# Patient Record
Sex: Male | Born: 1935
Health system: Southern US, Community
[De-identification: ages and names within clinical notes are randomized; demographics above are authoritative.]

## PROBLEM LIST (undated history)

## (undated) DIAGNOSIS — K922 Gastrointestinal hemorrhage, unspecified: Secondary | ICD-10-CM

## (undated) DIAGNOSIS — K5730 Diverticulosis of large intestine without perforation or abscess without bleeding: Secondary | ICD-10-CM

## (undated) DIAGNOSIS — Z9289 Personal history of other medical treatment: Secondary | ICD-10-CM

## (undated) DIAGNOSIS — K219 Gastro-esophageal reflux disease without esophagitis: Secondary | ICD-10-CM

## (undated) DIAGNOSIS — R634 Abnormal weight loss: Secondary | ICD-10-CM

## (undated) DIAGNOSIS — N183 Chronic kidney disease, stage 3 unspecified: Secondary | ICD-10-CM

## (undated) DIAGNOSIS — K4090 Unilateral inguinal hernia, without obstruction or gangrene, not specified as recurrent: Secondary | ICD-10-CM

## (undated) DIAGNOSIS — I119 Hypertensive heart disease without heart failure: Secondary | ICD-10-CM

## (undated) DIAGNOSIS — I2510 Atherosclerotic heart disease of native coronary artery without angina pectoris: Secondary | ICD-10-CM

## (undated) DIAGNOSIS — F32A Depression, unspecified: Secondary | ICD-10-CM

## (undated) DIAGNOSIS — N529 Male erectile dysfunction, unspecified: Secondary | ICD-10-CM

## (undated) DIAGNOSIS — N35919 Unspecified urethral stricture, male, unspecified site: Secondary | ICD-10-CM

## (undated) DIAGNOSIS — F419 Anxiety disorder, unspecified: Secondary | ICD-10-CM

## (undated) DIAGNOSIS — Z8719 Personal history of other diseases of the digestive system: Secondary | ICD-10-CM

## (undated) DIAGNOSIS — B029 Zoster without complications: Secondary | ICD-10-CM

## (undated) DIAGNOSIS — D509 Iron deficiency anemia, unspecified: Secondary | ICD-10-CM

## (undated) DIAGNOSIS — I739 Peripheral vascular disease, unspecified: Secondary | ICD-10-CM

## (undated) DIAGNOSIS — R55 Syncope and collapse: Secondary | ICD-10-CM

## (undated) DIAGNOSIS — C3490 Malignant neoplasm of unspecified part of unspecified bronchus or lung: Secondary | ICD-10-CM

## (undated) DIAGNOSIS — R001 Bradycardia, unspecified: Secondary | ICD-10-CM

## (undated) DIAGNOSIS — F329 Major depressive disorder, single episode, unspecified: Secondary | ICD-10-CM

## (undated) DIAGNOSIS — Z86010 Personal history of colonic polyps: Secondary | ICD-10-CM

## (undated) DIAGNOSIS — E785 Hyperlipidemia, unspecified: Secondary | ICD-10-CM

## (undated) DIAGNOSIS — I5189 Other ill-defined heart diseases: Secondary | ICD-10-CM

## (undated) DIAGNOSIS — R0600 Dyspnea, unspecified: Secondary | ICD-10-CM

## (undated) HISTORY — DX: Iron deficiency anemia, unspecified: D50.9

## (undated) HISTORY — DX: Dyspnea, unspecified: R06.00

## (undated) HISTORY — DX: Atherosclerotic heart disease of native coronary artery without angina pectoris: I25.10

## (undated) HISTORY — DX: Hyperlipidemia, unspecified: E78.5

## (undated) HISTORY — DX: Depression, unspecified: F32.A

## (undated) HISTORY — DX: Gastro-esophageal reflux disease without esophagitis: K21.9

## (undated) HISTORY — PX: INGUINAL HERNIA REPAIR: SUR1180

## (undated) HISTORY — DX: Syncope and collapse: R55

## (undated) HISTORY — DX: Male erectile dysfunction, unspecified: N52.9

## (undated) HISTORY — DX: Major depressive disorder, single episode, unspecified: F32.9

## (undated) HISTORY — PX: TRANSURETHRAL RESECTION OF PROSTATE: SHX73

## (undated) HISTORY — DX: Diverticulosis of large intestine without perforation or abscess without bleeding: K57.30

## (undated) HISTORY — DX: Zoster without complications: B02.9

## (undated) HISTORY — DX: Unspecified urethral stricture, male, unspecified site: N35.919

## (undated) HISTORY — DX: Anxiety disorder, unspecified: F41.9

## (undated) HISTORY — DX: Abnormal weight loss: R63.4

## (undated) HISTORY — DX: Unilateral inguinal hernia, without obstruction or gangrene, not specified as recurrent: K40.90

## (undated) HISTORY — DX: Personal history of colonic polyps: Z86.010

## (undated) HISTORY — PX: CHOLECYSTECTOMY: SHX55

---

## 1989-01-18 DIAGNOSIS — B029 Zoster without complications: Secondary | ICD-10-CM

## 1989-01-18 HISTORY — DX: Zoster without complications: B02.9

## 1992-05-20 HISTORY — PX: CORONARY ARTERY BYPASS GRAFT: SHX141

## 2002-08-20 ENCOUNTER — Encounter: Payer: Self-pay | Admitting: Internal Medicine

## 2002-08-20 ENCOUNTER — Ambulatory Visit (HOSPITAL_COMMUNITY): Admission: RE | Admit: 2002-08-20 | Discharge: 2002-08-20 | Payer: Self-pay | Admitting: Internal Medicine

## 2003-08-13 ENCOUNTER — Emergency Department (HOSPITAL_COMMUNITY): Admission: EM | Admit: 2003-08-13 | Discharge: 2003-08-13 | Payer: Self-pay | Admitting: Emergency Medicine

## 2003-08-17 ENCOUNTER — Emergency Department (HOSPITAL_COMMUNITY): Admission: EM | Admit: 2003-08-17 | Discharge: 2003-08-17 | Payer: Self-pay | Admitting: Emergency Medicine

## 2003-08-22 ENCOUNTER — Encounter (INDEPENDENT_AMBULATORY_CARE_PROVIDER_SITE_OTHER): Payer: Self-pay | Admitting: Specialist

## 2003-08-22 ENCOUNTER — Inpatient Hospital Stay (HOSPITAL_COMMUNITY): Admission: RE | Admit: 2003-08-22 | Discharge: 2003-08-24 | Payer: Self-pay | Admitting: Urology

## 2005-05-20 DIAGNOSIS — Z86010 Personal history of colonic polyps: Secondary | ICD-10-CM

## 2005-05-20 DIAGNOSIS — Z860101 Personal history of adenomatous and serrated colon polyps: Secondary | ICD-10-CM

## 2005-05-20 HISTORY — DX: Personal history of adenomatous and serrated colon polyps: Z86.0101

## 2005-05-20 HISTORY — DX: Personal history of colonic polyps: Z86.010

## 2005-05-20 HISTORY — PX: CARDIAC CATHETERIZATION: SHX172

## 2006-02-03 ENCOUNTER — Inpatient Hospital Stay (HOSPITAL_COMMUNITY): Admission: EM | Admit: 2006-02-03 | Discharge: 2006-02-05 | Payer: Self-pay | Admitting: Emergency Medicine

## 2006-05-20 DIAGNOSIS — R55 Syncope and collapse: Secondary | ICD-10-CM

## 2006-05-20 HISTORY — DX: Syncope and collapse: R55

## 2006-05-28 ENCOUNTER — Encounter (INDEPENDENT_AMBULATORY_CARE_PROVIDER_SITE_OTHER): Payer: Self-pay | Admitting: Interventional Cardiology

## 2006-05-28 ENCOUNTER — Inpatient Hospital Stay (HOSPITAL_COMMUNITY): Admission: EM | Admit: 2006-05-28 | Discharge: 2006-05-29 | Payer: Self-pay | Admitting: Emergency Medicine

## 2008-02-18 ENCOUNTER — Encounter: Admission: RE | Admit: 2008-02-18 | Discharge: 2008-02-18 | Payer: Self-pay | Admitting: Urology

## 2008-02-22 ENCOUNTER — Ambulatory Visit (HOSPITAL_BASED_OUTPATIENT_CLINIC_OR_DEPARTMENT_OTHER): Admission: RE | Admit: 2008-02-22 | Discharge: 2008-02-23 | Payer: Self-pay | Admitting: Urology

## 2008-05-20 DIAGNOSIS — R0600 Dyspnea, unspecified: Secondary | ICD-10-CM

## 2008-05-20 HISTORY — DX: Dyspnea, unspecified: R06.00

## 2008-07-01 ENCOUNTER — Encounter: Payer: Self-pay | Admitting: Emergency Medicine

## 2008-07-01 ENCOUNTER — Encounter: Admission: RE | Admit: 2008-07-01 | Discharge: 2008-07-01 | Payer: Self-pay | Admitting: Internal Medicine

## 2008-07-20 ENCOUNTER — Encounter: Payer: Self-pay | Admitting: Emergency Medicine

## 2008-08-02 ENCOUNTER — Encounter: Admission: RE | Admit: 2008-08-02 | Discharge: 2008-08-02 | Payer: Self-pay | Admitting: Gastroenterology

## 2008-09-01 ENCOUNTER — Encounter: Payer: Self-pay | Admitting: Emergency Medicine

## 2008-09-16 ENCOUNTER — Encounter: Payer: Self-pay | Admitting: Emergency Medicine

## 2008-09-21 ENCOUNTER — Ambulatory Visit: Payer: Self-pay | Admitting: Emergency Medicine

## 2008-09-21 DIAGNOSIS — I2510 Atherosclerotic heart disease of native coronary artery without angina pectoris: Secondary | ICD-10-CM | POA: Insufficient documentation

## 2008-09-21 DIAGNOSIS — R0600 Dyspnea, unspecified: Secondary | ICD-10-CM

## 2008-09-21 DIAGNOSIS — R0689 Other abnormalities of breathing: Secondary | ICD-10-CM

## 2008-09-21 DIAGNOSIS — R930 Abnormal findings on diagnostic imaging of skull and head, not elsewhere classified: Secondary | ICD-10-CM

## 2008-09-21 DIAGNOSIS — K449 Diaphragmatic hernia without obstruction or gangrene: Secondary | ICD-10-CM | POA: Insufficient documentation

## 2008-10-13 ENCOUNTER — Encounter: Payer: Self-pay | Admitting: Emergency Medicine

## 2008-10-19 ENCOUNTER — Ambulatory Visit: Payer: Self-pay | Admitting: Emergency Medicine

## 2008-10-28 ENCOUNTER — Encounter: Payer: Self-pay | Admitting: Emergency Medicine

## 2008-11-01 ENCOUNTER — Encounter: Payer: Self-pay | Admitting: Emergency Medicine

## 2008-11-04 ENCOUNTER — Ambulatory Visit: Payer: Self-pay | Admitting: Emergency Medicine

## 2008-11-17 DIAGNOSIS — N35919 Unspecified urethral stricture, male, unspecified site: Secondary | ICD-10-CM

## 2008-11-17 HISTORY — DX: Unspecified urethral stricture, male, unspecified site: N35.919

## 2009-01-03 ENCOUNTER — Telehealth (INDEPENDENT_AMBULATORY_CARE_PROVIDER_SITE_OTHER): Payer: Self-pay | Admitting: *Deleted

## 2009-01-18 ENCOUNTER — Ambulatory Visit: Payer: Self-pay | Admitting: Emergency Medicine

## 2010-10-02 NOTE — Op Note (Signed)
Scott [name], Scott [name]             ACCOUNT NO.:  [id number]   MEDICAL RECORD NO.:  [id number]          PATIENT TYPE:  AMB   LOCATION:  [facility]                         FACILITY:  [facility]   PHYSICIAN:  Bertram Millard. Dahlstedt, M.D.DATE OF BIRTH:  [birth year]   DATE OF PROCEDURE:  02/22/2008  DATE OF DISCHARGE:                               OPERATIVE REPORT   PREOPERATIVE DIAGNOSIS:  Benign prostatic hypertrophy/regrowth of  prostate, urethral stricture.   POSTOPERATIVE DIAGNOSIS:  Benign prostatic hypertrophy/regrowth of  prostate, urethral stricture.   PRINCIPAL PROCEDURE:  Cystoscopy, dilatation of the urethra, Gyrus  transurethral resection of the prostate.   SURGEON:  Bertram Millard. Dahlstedt, M.D.   ANESTHESIA:  General with LMA.   COMPLICATIONS:  None.   BRIEF HISTORY:  [name] is an 77 year old male who is several  years out from a TURP.  He has had worsening urinary symptoms, mainly  obstructive in nature.  Cystoscopy earlier this year revealed the  patient to have significant stricture and obvious regrowth of his  prostate.  His symptoms have become tiresome for him, and he desires  further management.  It was recommended that he undergo an operative  procedure to re-resect the regrown prostate.  Risks and complications of  the procedure were discussed with the patient.  He understands these and  desires to proceed.   DESCRIPTION OF PROCEDURE:  The patient was identified in the holding  area.  He received preoperative IV antibiotics.  He was taken to the  operating room where general anesthetic was administered using LMA.  He  was placed in the dorsal lithotomy position.  Genitalia and perineum  were prepped and draped.  Time-out was then called.   The procedure then commenced.  A 22-French panendoscope was advanced  under direct vision through his urethra.  He had a fairly dense  stricture at his bulbous urethra.  I barely got the scope by.  There was  obvious regrowth of  the apical prostate.  The bladder neck, however, was  fairly wide open.  The tissue was obstructive.  I dilated the urethral  stricture to 28-French with Sissy Hoff sounds.  I then placed the  resectoscope - 28-French with the obturator.  The Gyrus element with  resectoscope was then placed.  I first identified the ureteral orifices  which were seen well inside the bladder neck which was wide open.  The  mid and apical tissue of the prostate was obstructive bilaterally.  I  used the button on the gyrus device to resect this apical and mid  prostatic tissue down to the surgical capsule circumferentially around  the prostate.  Resection time was approximately 40 minutes.  No specimen  was sent.  Following resecting the prostate to wide open fossa  circumferentially, the Gyrus was used to cauterize the prostatic  fossa which had been resected.  With the irrigation off, I could see no  active bleeding.  At this point the bladder was drained, the scope  removed, an 20-French Foley catheter placed.  This was hooked to  dependent drainage.   The patient tolerated procedure well.  He  was awakened and taken to PACU  in stable condition.      Bertram Millard. Dahlstedt, M.D.  Electronically Signed     SMD/MEDQ  D:  02/22/2008  T:  02/23/2008  Job:  [id number]   cc:   Corky Crafts, MD  Fax: [phone]   Candyce Churn, M.D.  Fax: [phone]

## 2010-10-05 NOTE — Discharge Summary (Signed)
Scott [name], Scott [name]             ACCOUNT NO.:  [id number]   MEDICAL RECORD NO.:  [id number]          PATIENT TYPE:  INP   LOCATION:  6524                         FACILITY:  [facility]   PHYSICIAN:  Candyce Churn, M.D.DATE OF BIRTH:  [birth year]   DATE OF ADMISSION:  02/11/2006  DATE OF DISCHARGE:  02/05/2006                                 DISCHARGE SUMMARY   DISCHARGE DIAGNOSES:  1. Non-ST-segment elevation myocardial infarction.  2. Status post coronary bypass grafting in 1994 with left heart      catheterization performed on February 04, 2006, with the following      results:  3. Patent left internal mammary artery to the left anterior descending      saphenous vein graft obtuse marginal and saphenous vein graft diagonal      and saphenous vein graft to right coronary artery.  4. Severe native 3-vessel coronary artery disease including the distal      circumflex disease which has likely caused the non-ST-segment change      myocardial infarction during this hospitalization.  Not amenable to      percutaneous intervention.  Also, severe diffuse left anterior      descending disease.  5. Right coronary osteal lesion was amenable to percutaneous coronary      intervention but because the vein graft to the right coronary artery      was patent no percutaneous coronary intervention was necessary.  Left      ventral function was inferior apical hypokinesis and no evidence of      renal artery stenosis.  6. Hypertension.  7. Hypercholesterolemia.  8. History of anxiety.  9. History of depression.  10.Gastroesophageal reflux disease.  11.Erectile dysfunction.  12.Iron-deficiency anemia, thought to be secondary to poor ability to      absorb iron.  Perhaps thinking about triosephosphate isomerase therapy.  13.Patient also has a history of sigmoid diverticulosis per colonoscopy by      Dr. Stan Head in March/2004.  Patient also had a colonoscopy in Oct 15, 2005, by Dr.  Danise Edge revealing a few scattered diverticula      in the sigmoid colon.  Otherwise, was within normal limits.   CONSULTATIONS:  Dr. Everette Rank, Cardiology, February 03, 2006.   PROCEDURES:  Cardiac catheterization with result as above.   DISCHARGE MEDICATIONS:  1. Wellbutrin SR 150 mg orally daily.  2. Toprol-XL 50 mg daily.  3. Omeprazole 20 mg daily.  4. Effexor XR 37.5 mg orally daily.  5. Iron sulfate 325 mg orally b.i.d.  6. Crestor 10 mg orally daily.  7. Imdur 30 mg daily.  8. Aspirin 81 mg orally daily.  9. Vitamin C 500 mg orally b.i.d.  10.Sublingual nitroglycerin 0.4 mg q.5 minutes p.r.n. chest pain up to 3      doses.  11.I plan to add ACE inhibitor and possibly Plavix as an outpatient.   HOSPITAL COURSE:  Scott [name] is a very pleasant 77 year old male  who had has a history of coronary artery disease, who had CABG at [facility] in  [place], [place] in 1994.  CABG was x4.   He has had no chest pain symptoms at all since that time until the morning  of admission when approximately an hour after playing tennis he was sitting  at home quietly and developed a very heavy substernal chest pressure which  apparently lasted about 30 minutes.  He felt like a 50-pound weight was on  my chest the pain radiated down to his shoulders.  He took several aspirin  and the pain seemed to improve and he called our office.  He was seen in our  office, EKG revealed a slight ST-segment elevation in leads II, III and aVF  and a slight ST-segment depression in lead aVL, and the patient was  transferred to [facility] via ambulance with a presumptive  diagnosis of myocardial infarction.   On admission, the patient had cardiac enzymes suggesting mild myocardial  injury with CK peaking at only 315, a CK-MB peaked at 35.1, and troponin I  peaked at 5.52.   The patient was seen in consultation by Dr. Eldridge Dace, who elected to perform  cardiac  catheterization with the results above.   It was felt that Mr. Zelek should be treated medically for his non-Q-  wave myocardial infarction.   Patient experienced no further chest pain while hospitalized and on  admission was treated with intravenous nitroglycerin, intravenous heparin,  and continued aspirin.   LABORATORIES:  Vital signs were stable throughout his hospitalization and  patient was pleased to hear that his coronary artery bypass grafts from 1994  were all remarkably patent.   Patient was found to have an anemia while hospitalized and iron studies  revealed an iron of 21, low, and percent saturation only 5%.  Hemoccult  studies were not performed.   He has been known to have iron deficiency in the past and was to be  discharged on iron supplementation twice daily.  He will be followed up in  the office in 1 week.   LABORATORIES ON DISCHARGE REVEALED THE FOLLOWING:  White count 6500,  hemoglobin 9.4, platelet count 160,000, pro time 12.6 seconds, PTT 30  seconds, sodium 139, potassium 4.2, chloride 106, bicarb 27, glucose 106,  BUN 16, creatinine 1.4, calcium 9.1.   CK peaked at 315 with a CK-MB of 35.1 and troponin of 5.52.  Cholesterol was  190, triglyceride 163, HDL 51, LDL 106, total cholesterol to HDL ratio of  3.7, TSH was normal at 3.458, and iron studies were again as above with an  iron of 21, saturation of 5%, iron binding capacity normal at 382, vitamin  B12 level was normal at 304 mcg per mL.     Candyce Churn, M.D.  Electronically Signed    RNG/MEDQ  D:  02/11/2006  T:  02/11/2006  Job:  [id number]   cc:   Corky Crafts, MD

## 2010-10-05 NOTE — H&P (Signed)
NAMERAYSHUN, KANDLER NO.:  [id number]   MEDICAL RECORD NO.:  [id number]          PATIENT TYPE:  EMS   LOCATION:  [facility]                         FACILITY:  [facility]   PHYSICIAN:  Lucita Ferrara, MD         DATE OF BIRTH:  May 09, 1924   DATE OF ADMISSION:  05/27/2006  DATE OF DISCHARGE:                              HISTORY & PHYSICAL   The patient is an 77 year old male who presents to [facility]  Emergency Room status post a syncopal episode which occurred earlier in  the day.  The patient denied any focal chest pain, shortness of breath.  The patient apparently was in the shower/steam room at which time he  developed weakness and dizziness when she switched from very hot water  to cold water.  Again, when I spoke to him, he denied any chest pain,  although he told the emergency room physician that he had chest pain  that was radiating into the neck and he also had diaphoresis.  He denied  that to me.  He denied any abdominal pain, gastroesophageal reflux or  reproducibility of the chest pain apparently.  The patient was given  aspirin and nitroglycerin and apparently responded to the therapy.   PAST MEDICAL HISTORY:  1. Significant for coronary artery disease.  2. Hypertension.  3. Anemia.   PAST SURGICAL HISTORY:  He has had a coronary artery bypass graft.  He  had his last catheterization done in 2007 due to a non-ST-segment  elevation myocardial infarction which showed a left internal mammary  artery to left anterior descending artery, saphenous vein graft to  obtuse marginal, saphenous vein graft to diagonal, saphenous vein graft  to right coronary artery to be patent.  It did show a lesion amenable to  percutaneous coronary intervention in the ostial right coronary artery  but there was a vein graft in the right coronary artery that was patent,  so there was no percutaneous coronary intervention done at the time.  Recommendations were aggressive medical  therapy at that time.   SOCIAL HISTORY:  The patient denies any drugs, alcohol or smoking.   PHYSICAL EXAMINATION:  GENERAL:  The patient is in no acute distress.  Blood pressure 100/51, pulse is 51, respiration 18, pulse ox 94% on room  air.  HEENT:  Normocephalic and atraumatic.  Sclerae anicteric.  No JVD.  No  carotid bruits.  NECK:  Supple.  PERRLA.  Extraocular muscles intact.  Sclerae anicteric.  CARDIOVASCULAR:  S1-S2, regular rate and rhythm.  No murmurs or rubs.  ABDOMEN:  Soft, nontender, and nondistended.  Positive bowel sounds.  EXTREMITIES:  Without clubbing, cyanosis, or edema.  CHEST:  Clear to auscultation bilaterally.  No rhonchi, wheezes or  rales.   LABORATORY DATA:  Shows a white count of 8.7, hemoglobin 13.4,  hematocrit 40.7, platelet count is 171.  Sodium 138, potassium 5,  chloride 111, BUN 32, glucose 102, creatinine 1.2.  The patient was  given bolus fluids to gravity.  The patient's blood pressure improved  just with fluids.  The patient was alert and oriented  when I speak to  him.  Cardiology was called and recommendations were for observation  overnight.   ASSESSMENT/PLAN:  This is an 77 year old male status post syncopal  episode most likely secondary to vasovagal response from changes in  water temperature.  Myocardial infarction needs to be ruled out  considering history of ischemic heart disease.  Perhaps medications need  to be adjusted.  We will go ahead and observe  overnight with enzymes q.8h. x3.  We will put him in the telemetry unit  to detect any arrhythmias.  The patient is currently hemodynamically  stable.  As far as medications, we will go ahead and continue with blood  pressure parameters.      Lucita Ferrara, MD  Electronically Signed     RR/MEDQ  D:  05/28/2006  T:  05/28/2006  Job:  [id number]

## 2010-10-05 NOTE — Cardiovascular Report (Signed)
Scott [name], Scott [name]             ACCOUNT NO.:  [id number]   MEDICAL RECORD NO.:  [id number]          PATIENT TYPE:  INP   LOCATION:  6524                         FACILITY:  [facility]   PHYSICIAN:  Corky Crafts, MDDATE OF BIRTH:  November 04, 1923   DATE OF PROCEDURE:  02/04/2006  DATE OF DISCHARGE:                              CARDIAC CATHETERIZATION   REFERRING PHYSICIAN:  Candyce Churn, M.D.   PROCEDURES PERFORMED:  Left heart catheterization, left ventriculogram,  abdominal aortogram, coronary angiogram, vein graft angiogram and arterial  graft angiography.   INDICATIONS:  Non-ST segment elevation MI.   OPERATOR:  Corky Crafts, MD.   PROCEDURAL NARRATIVE:  The risks and benefits of cardiac catheterization  were explained to the patient and informed consent was obtained.  The  patient was brought to the cath lab and placed on the table.  He was prepped  and draped in usual sterile fashion.  His right groin was infiltrated with  1% lidocaine, a 6-French arterial sheath was placed into his right femoral  artery using modified Seldinger technique.  The pigtail catheter was  advanced to the aortic valve and across using fluoroscopic guidance. A left  ventriculogram was performed in the 30 degree RAO position.  The catheter  was then pulled back under continuous hemodynamic pressure monitoring.  The  catheter was withdrawn to the level of the renal arteries.  Power injection  of contrast was used to perform the abdominal aortogram.  The  catheter was  then removed.  A JL-4 catheter was used to perform left coronary artery  angiography.  Digital angiography was performed using hand injection of  contrast.  Right coronary artery angiography was then performed used a JR-4  catheter.  The catheter was advanced through the vessel ostium under  fluoroscopic guidance.  Digital angiography was performed in multiple  projections using hand injection of contrast.  Vein graft  angiography was  then performed using the JR-4 catheter.  The graft to the diagonal and  circumflex were identified.  The graft to the right coronary artery could  not be identified with this catheter.  The right catheter was then used to  try to engage the subclavian artery but this was unsuccessful.  The RCA  catheter was then removed and a LIMA catheter was inserted.  This was used  to engage the subclavian.  The catheter was advanced over wire distal to the  origin of the LIMA.  The catheter was then pulled back.  Digital angiography  was performed in multiple projections using hand injection of contrast to  the LIMA and LAD.  The right coronary artery vein graft was then engaged  using a RCB catheter.  Digital angiography was performed in multiple  projections using hand injection of contrast.  The sheath will be removed  using manual compression.   FINDINGS:  The left main is occluded.  There right coronary artery has an  ostial 70% lesion was catheter damping.  There is moderate disease  throughout mid vessel.  SVG to diagonal is widely patent with mild  irregularities.  There is a 70% lesion  in the diagonal proximal to the graft  insertion.  The SVG to the circumflex/OM is widely patent.  The circumflex  and OM vessels are diffusely diseased.  At the distal circumflex there is a  subtotal occlusion which may in fact be the culprit vessel for the patient's  clinical presentation.  The LIMA to the LAD is widely patent.  There is  diffuse severe disease in the mid and distal LAD.  There is a diagonal seen  backfilling.  The SVG to RCA is widely patent.  There are mild  irregularities.  There is a patent posterior descending artery and  posterolateral branch.  The left ventriculogram shows inferoapical focal  hypokinesis.  Overall left ventricular ejection fraction is 40-45%.  The  abdominal aortogram reveals diffuse moderate atherosclerosis of the  infrarenal aorta.  There are single  renal arteries to each kidney.  There is  mild plaquing in both ostia of the renal arteries.   HEMODYNAMIC RESULTS:  Left ventricular pressure 168/10 with an LVEDP of 24,  aortic pressure 166/72 with a mean aortic pressure of 108.   IMPRESSION:  1. Patent left internal mammary artery to left anterior descending,      saphenous vein graft to obtuse marginal, saphenous vein graft to      diagonal, saphenous vein graft to right coronary artery.  2. Severe native three-vessel coronary artery disease including distal      circumflex disease which is the likely culprit for this clinical      presentation.  It is not amenable to percutaneous coronary      intervention.  There is also severe diffuse left anterior descending      disease noted.  3. The only lesion amenable to percutaneous coronary intervention is the      ostial right coronary artery, but since the vein graft to the right      coronary artery is patent, no percutaneous coronary intervention is      necessary.  4. No renal artery stenosis.  5. Decreased left ventricular function with inferoapical hypokinesis.   RECOMMENDATIONS:  The patient received aggressive medical therapy for his  coronary artery disease and decreased ventricular function.  Continue  aspirin, Statin and beta blocker. I would consider adding an ACE inhibitor  if the renal function would tolerate it.  I also considered adding Plavix  when issues with his hemoglobin are fully sorted out.      Corky Crafts, MD  Electronically Signed     JSV/MEDQ  D:  02/04/2006  T:  02/05/2006  Job:  [id number]

## 2010-10-05 NOTE — Op Note (Signed)
NAMEDANNEY, BUNGERT                       ACCOUNT NO.:  [id number]   MEDICAL RECORD NO.:  [id number]                   PATIENT TYPE:  INP   LOCATION:  0378                                 FACILITY:  [facility]   PHYSICIAN:  Bertram Millard. Dahlstedt, M.D.          DATE OF BIRTH:  [birth year]   DATE OF PROCEDURE:  08/22/2003  DATE OF DISCHARGE:                                 OPERATIVE REPORT   PREOPERATIVE DIAGNOSIS:  Benign prostatic hypertrophy with retention.   POSTOPERATIVE DIAGNOSIS:  Benign prostatic hypertrophy with retention.   PRINCIPAL PROCEDURE:  1. Cystoscopy.  2. Transurethral resection of prostate.   SURGEON:  Bertram Millard. Dahlstedt, M.D.   ANESTHESIA:  Subarachnoid block.   COMPLICATIONS:  None.   BRIEF HISTORY:  This nice, 77 year old gentleman initially presented to my  office on the 28th of last month.  He was in retention at that time.  He had  had a catheter placed before he saw me.  He has failed voiding trials x 2.  At this point, the patient desires curative therapy.  He has been left with  a catheter.   It was recommended that he undergo transurethral resection of the prostate.  Options included more voiding trials, a TURP versus minimally invasive  thermotherapy.  He has chosen to have the more aggressive therapies.  He is  aware of the risks and complications, including but not limited to, blood  loss; infection; a temporary leakage; a retrograde ejaculation, among  others.  He understands these and desires to proceed.   DESCRIPTION OF PROCEDURE:  The patient was administered a subarachnoid  block.  He was placed in the dorsal lithotomy position.  Preoperative IV  antibiotics had been administered.  A 22 French panendoscope was passed  through his urethra.  His prostate was significantly obstructed, at least 4  cm long.  He had a large median lobe as well as coapting lateral lobes.  The  bladder was inspected.  There were scattered trabeculations and  cellules.  The ureteral orifices were identified.  Resection was started with the  median lobe.  The intravesical median lobe was resected, taking care to  avoid the ureteral orifices.  Once this was performed, the anterior prostate  was resected.  This allowed the lateral lobes to fall apart and be in a more  posterior location.  Using the verumontanum as a landmark, both lateral  lobes were resected down to the surgical capsule, starting anteriorly and  moving posteriorly.  A significant amount of adenoma was resected.  Following adequate resection of both the median and lateral lobes,  hemostasis was achieved carefully by systematically going over the prostate  and taking care of the small bleeders with the electrocautery.  Hemostasis  was excellent.  The chips were irrigated from the bladder.  The bladder neck  mucosa was then carefully inspected and all small bleeders were identified  and coagulated.  At this point, there was  no active bleeding.  The bladder  was then again inspected for small retained chips.  None were seen.  At this  time, the scope was removed, and a 22 [place] three-way Foley catheter with a  30 mL balloon was placed.  This was hooked to irrigation.   The patient tolerated the procedure well.  Chips were sent for pathologic  specimen.  He was transported to the PACU in stable condition.                                               Bertram Millard. Dahlstedt, M.D.    SMD/MEDQ  D:  08/22/2003  T:  08/22/2003  Job:  [id number]   cc:   Candyce Churn, M.D.  [address] [place]  [place] [address]  Fax: [phone]

## 2010-10-05 NOTE — Consult Note (Signed)
[name], Scott [name]             ACCOUNT NO.:  [id number]   MEDICAL RECORD NO.:  [id number]          PATIENT TYPE:  INP   LOCATION:  6524                         FACILITY:  [facility]   PHYSICIAN:  Corky Crafts, MDDATE OF BIRTH:  08-Nov-1923   DATE OF CONSULTATION:  02/03/2006  DATE OF DISCHARGE:  02/05/2006                                   CONSULTATION   REASON FOR CONSULTATION:  Chest pain and EKG changes.   HISTORY OF PRESENT ILLNESS:  The patient is an 77 year old man with a  history of bypass surgery in 1994.  He also has high blood pressure and  hyperlipidemia.  He had a sudden onset of non-exertional chest pain several  weeks ago after playing tennis.  This went away spontaneously. A similar  event occurred today but was more severe. The chest pain felt like a weight  sitting on his chest.  There was radiation to his neck and both shoulders.  He did have some diaphoresis.  There is no shortness of breath.  He did have  chest pain prior to his bypass surgery but it was even more severe than this  episode.  The chest pain was relieved after aspirin and nitroglycerin was  administered. He had some ST-segment changes in the inferior leads.  He was  sent to the emergency room for further evaluation.   PAST MEDICAL HISTORY:  1. Coronary artery disease status post bypass.  2. Hypertension.  3. Dyslipidemia.  4. Gastroesophageal reflux disease.  5. Iron deficiency anemia.  6. Depression and anxiety.  7. BPH.   PAST SURGICAL HISTORY:  CABG, TURP, cholecystectomy, and inguinal hernia  repair.   ALLERGIES:  No known drug allergies.   MEDICATIONS AT HOME:  Aspirin 81 mg daily, Crestor 10 mg daily, Toprol XL 50  mg daily, Effexor 37.5 mg daily, iron, Omeprazole, Wellbutrin, vitamin C.   FAMILY HISTORY:  No early coronary artery disease.   SOCIAL HISTORY:  The patient lives with his wife in [place].  He does not  smoke.  He does not use any illegal drugs.  He occasionally  drinks alcohol.   REVIEW OF SYSTEMS:  No recent weight loss.  No fevers or chills.  No nausea  or vomiting.  No focal weakness. Chest pain as described above.  All other  systems negative.   PHYSICAL EXAMINATION:  VITAL SIGNS:  Blood pressure is 154/65, heart rate 50.  GENERAL:  The patient is awake, alert, in no apparent distress.  HEENT:  Head normocephalic, atraumatic.  Eyes:  Extraocular movements  intact.  NECK:  No carotid bruits.  CARDIOVASCULAR:  Bradycardic, S1 and S2, well healed CABG scar.  LUNGS:  Clear to auscultation bilaterally.  ABDOMEN:  Soft, nontender.  EXTREMITIES:  No edema, 2+ posterior tibial pulses bilaterally, 2+ right  femoral pulse.  SKIN:  No rash.  NEUROLOGICAL:  No focal motor or sensory deficits.  PSYCHIATRIC:  Normal mood and affect.  BACK:  No kyphosis or scoliosis.   LABORATORY DATA:  Hemoglobin 10.4. Cardiac enzymes showed troponin 0.08 with  a CK of 134 and MB of 4.7.  EKG showed normal sinus rhythm with some Q-waves  and slight ST-segment elevation noted in the inferior leads.   MEDICAL DECISION MAKING:  77 year old with unstable angina.   PLAN:  1. Continue IV nitroglycerin and heparin.  The patient's troponin is      mildly elevated, we will plan for cardiac catheterization in the      morning.  The patient is currently pain free.  2. Continue Toprol and Crestor along with aspirin.  3. Based on the results of the tests, I would consider adding Plavix, as      well.  4. The risks and benefits of cardiac catheterization were explained to the      patient and he agrees with proceeding.   We will follow the patient while he is in the hospital.      Corky Crafts, MD  Electronically Signed     JSV/MEDQ  D:  02/05/2006  T:  02/06/2006  Job:  [id number]

## 2010-10-05 NOTE — Discharge Summary (Signed)
[name], [name]             ACCOUNT NO.:  [id number]   MEDICAL RECORD NO.:  [id number]          PATIENT TYPE:  INP   LOCATION:  6524                         FACILITY:  [facility]   PHYSICIAN:  Candyce Churn, M.D.DATE OF BIRTH:  1924/01/11   DATE OF ADMISSION:  02/03/2006  DATE OF DISCHARGE:  02/05/2006                                 DISCHARGE SUMMARY   CHIEF COMPLAINT:  Chest pain.   HISTORY OF PRESENT ILLNESS:  [name] is a pleasant 77 year old  male with a history of coronary artery disease with CABG times four in 1994.  He presented with approximately 30 minutes to an hour of chest that felt  like a 50 pound weight in the center of his chest radiating to both  shoulders that occurred spontaneously while sitting at home approximately an  hour after playing tennis this morning. He felt quite well while playing  tennis. He took several aspirin and the pain ultimately resolved. He came  into [facility] for assessment of the pain that he had  previously and EKG was abnormal with ST segment elevation in 2, 3, AVF and  reciprocal changes in AVL. Approximately 1 mm elevation of ST segment. He  was transported to the Emergency Room for emergent cardiac consult and  intravenous Heparin and nitroglycerin were initiated.   PAST MEDICAL HISTORY:  (1) Hypertension.  (2) Hypercholesterolemia.  (3) Anxiety.  (4) Depression.  (5) GERD.  (6) Erectile dysfunction.  (7) Iron deficiency enema thought to be secondary to diverticulosis and  possibly secondary to PPI therapy chronically.   MEDICATIONS:  (1) Wellbutrin SR 150 mg daily.  (2) Effexor 32.5 mg daily.  (3) Toprol XL 50 mg daily.  (4) Omeprazole 20 mg daily.  (5) Iron sulfate 325 mg p.o. daily.  (6) Vitamin C 500 mg daily.  (7) Crestor 10 mg daily.   PAST SURGICAL HISTORY:  (1) CABG in 1994 with LIMA to the LAD and three  saphenous vein grafts.  (2) Status post cholecystectomy.  (3)  Bilateral inguinal hernia repair.  (4) TURP per Dr. Retta Diones in April 2005.   FAMILY HISTORY:  No history of premature coronary artery disease but two  brothers that developed heart disease in the 72's and 60's.   SOCIAL HISTORY:  The patient is married, retired Programmer, multimedia. Moved to  [place] approximately five years ago. Enjoys gardening and classical  music. Does get some counseling with Dr. Janean Sark for some mild chronic  marital issues that primarily occurred in the past. Getting along well with  his wife currently.   REVIEW OF SYSTEMS:  The patient denies any chest pain currently. No  shortness of breath. Did not have any diaphoresis with this. No recent  change in bowel habits or urinary habits. Otherwise feels well in general  and plays tennis four or five days a week.   PHYSICAL EXAMINATION:  Alert, oriented male in no acute distress. Weight  164, temperature 97.9, height 5 feet, 6 inches, pulse 56 and regular, blood  pressure 140/86.  HEENT exam reveals him to be atraumatic, normocephalic. Otherwise without  abnormalities. Mucous membranes are moist.  NECK: Supple without JVD or thyromegaly or carotid bruits.  CHEST: Clear to auscultation.  CARDIAC: Regular rhythm with no murmur, gallops, or rubs.  ABDOMEN: Soft, nontender and nondistended, bowel sounds are normal. No  obvious organomegaly.  EXTREMITIES: Without clubbing, cyanosis, or edema.  NEUROLOGICAL: Reveals him to be oriented times three. Nonfocal exam.  SKIN: Without rashes.   EKG reveals sinus bradycardia at 50. He has approximately 1 mm ST segment  elevation at 2, 3 and AVF and reciprocal at 1 mm ST segment depression in  AVL. He is in sinus bradycardia. He has got normal intervals and axis.  Consistent with possible acute inferior myocardial infarction.   ASSESSMENT:  Chest pain with EKG changes suggestive of an inferior  myocardial injury.   PLAN:  Transfer to the Emergency Room with  immediate Cardiac consult with IV  Heparin, IV nitroglycerin and nasal cannula O2. We will continue Crestor,  aspirin, Protonix, beta blocker therapy, Effexor and Wellbutrin. We will  also check serial cardiac enzymes and check a portable chest x-ray.      Candyce Churn, M.D.  Electronically Signed     RNG/MEDQ  D:  02/12/2006  T:  02/12/2006  Job:  [id number]   cc:   Corky Crafts, MD

## 2010-10-05 NOTE — Consult Note (Signed)
Scott [name], Scott [name]             ACCOUNT NO.:  [id number]   MEDICAL RECORD NO.:  [id number]          PATIENT TYPE:  INP   LOCATION:  5501                         FACILITY:  [facility]   PHYSICIAN:  Corky Crafts, MDDATE OF BIRTH:  1923/10/13   DATE OF CONSULTATION:  05/28/2006  DATE OF DISCHARGE:                                 CONSULTATION   REFERRING PHYSICIAN:  Candyce Churn, M.D.   REASON FOR CONSULTATION:  Syncope.   HISTORY OF PRESENT ILLNESS:  The patient is an 77 year old man who had  bypass surgery many years ago.  He underwent cardiac catheterization in  2007 which showed patent grafts with severe three-vessel native coronary  artery disease.  There is also significant distal circumflex disease  which was not amenable to PCI. The patient's ejection fraction of 40-45%   The patient was admitted [facility] yesterday after he had a syncopal  episode while in the shower. He had been in a sauna and then got up and  went to the shower. He felt weak and dizzy.  He denied any palpitations,  chest pain or shortness of breath.  He did lose consciousness after  sliding to the floor. He may have hit his head. He was given aspirin and  sublingual nitroglycerin by EMS upon arrival. He was found to have sinus  bradycardia with a rate in the 50s.  For a while in the hospital, his  telemetry showed sinus bradycardia.  He has not had any further symptoms  of lightheadedness.   PAST MEDICAL HISTORY:  1. Coronary artery disease status post CABG.  2. Hypertension.  3. Dyslipidemia.  4. GERD.  5. Anemia.  6. Depression, anxiety.  7. BPH.   PAST SURGICAL HISTORY:  1. CABG.  2. TURP.  3. Cholecystectomy.  4. Inguinal hernia repair.   ALLERGIES:  No known drug allergies.   MEDICATIONS AT HOME:  1. Toprol XL 50 mg daily.  2. Imdur 30 mg daily.  3. Crestor 10 mg daily.  4. Aspirin 81 mg daily.  5. Lasix.  6. Prilosec over-the-counter.  7. Effexor 37.5 daily.  8. Stool  softener 1 tablet daily.   FAMILY HISTORY:  No significant early coronary artery disease.   SOCIAL HISTORY:  The patient does not smoke, drink or use any illegal  drugs.   REVIEW OF SYSTEMS:  He does report issues with night sweats over the  past several weeks.  He has had syncope as well as reported above. He  has occasional chest pain which resolves spontaneously. All other  systems negative.   PHYSICAL EXAMINATION:  VITAL SIGNS: Blood pressure is 125/67, heart rate  ranged from 48-53, respiratory rate of 16, 96% on room air.  HEENT: Unremarkable.  NECK:  No JVD or carotid bruits.  LUNGS:  Clear to auscultation bilaterally.  CARDIOVASCULAR:  Bradycardic.  S1, S2.  A 2/6 early systolic murmur.  ABDOMEN:  Soft, nontender, nondistended.  EXTREMITIES:  Showed no edema.  NEUROLOGIC:  No focal motor or sensory deficits.  SKIN:  No rash.  BACK:  No kyphosis.  PSYCH:  Normal mood and affect.  LABORATORY DATA:  Shows a potassium this morning elevated at 6.0,  creatinine 1.4. Hematocrit 40.7. Troponin negative x2.   EKG shows sinus bradycardia, no significant ST-T wave changes.   MEDICAL DECISION-MAKING:  An 77 year old with syncopal episode.   PLAN:  1. Cardiac: The patient is bradycardic, but his heart rate has not      dropped below 40.  His heart rate has chronically been in this      range of the low 50s. I would be surprised if this were the cause      of his syncope unless his heart rate were to drop below 40.  Will      hold his Toprol regardless and see if he has any further      arrhythmias on telemetry.  2  Would check 2-D echocardiogram to evaluate valvular heart function.  1. A likely possibility is also just vasodilation in the setting of      being in a warm sauna. He also takes Effexor which can have      vasodilation as a side effect.  He also drinks alcohol regularly.      His creatinine is slightly elevated today which makes me wonder if      he may be a little  bit dehydrated. I would hold his Lasix as well      and perhaps give him some IV fluids.  2. I will follow the patient.      Corky Crafts, MD  Electronically Signed     JSV/MEDQ  D:  05/28/2006  T:  05/28/2006  Job:  [id number]

## 2010-10-05 NOTE — Discharge Summary (Signed)
Scott [name], Scott [name]             ACCOUNT NO.:  [id number]   MEDICAL RECORD NO.:  [id number]          PATIENT TYPE:  INP   LOCATION:  5501                         FACILITY:  [facility]   PHYSICIAN:  Candyce Churn, M.D.DATE OF BIRTH:  1923-12-04   DATE OF ADMISSION:  05/27/2006  DATE OF DISCHARGE:  05/29/2006                               DISCHARGE SUMMARY   DISCHARGE DIAGNOSES:  1. Syncope likely secondary to vasodilatation secondary to switching      from shower steam room to very cold water while at a health club      with possible concomitant mild dehydration.  2. History of coronary artery disease.  3. History of hypertension.  4. History of anemia.  5. History of coronary artery bypass graft with last cardiac      catheterization in 2007.  6. NonST segment elevation myocardial infarction in 2007 with a right      ostial coronary artery stenosis noted, but vein graft was patent to      the right coronary artery, and no percutaneous coronary      intervention was performed at the time.   DISCHARGE MEDICATIONS:  1. Toprol XL 25 mg daily which is a decrease in prior dose.  2. New iron three times daily.  3. Coated aspirin 81 mg daily.  4. Effexor 37.5 mg daily.  5. Imdur 30 mg a day.  6. Crestor 10 mg a day.  7. Prilosec 20 mg daily.  8. Lasix will be half usual dose at home prior to admission  -      question 20 mg daily.   CONSULTATIONS:  Dr. Lance Muss, Cardiology.   HOSPITAL COURSE:  Mr. Pulsifer  is a pleasant 77 year old male who had  bypass surgery many years ago and underwent cardiac catheterization in  2007 showing patent grafts with severe three-vessel negative coronary  artery disease, and he had a significant distal circumflex disease which  is not amenable to PCI  and a right ostial lesion, but it was that right  coronary ostial lesion that was not in need of a angioplasty and/or  splinting secondary to patent graft to the RCA.   The patient  admitted to [facility] on May 28, 2006 after having a  syncopal episode at a health club in the shower after going from hot to  cold temperature first in the Sauna and then to the cold shower.  Felt  weak and dizzy and apparently helped himself down to the floor and then  lost consciousness on the floor.  There is a possibility that he hit his  head, but there was no trauma was noted.  He was found to have sinus  bradycardia when EMS arrived, and telemetry in the hospital revealed  sinus bradycardia.  He had no further symptoms.   While hospitalized, he had no further symptoms of lightheadedness,  dizziness, or syncope, and no significant arrhythmias were noted.   Cardiac enzymes ruled out myocardial injury.   The patient was kept off Toprol while admitted and then was asked to  restart Toprol at a lower dose at discharge  on May 29, 2006.   DISCHARGE LABORATORY DATA:  White count 8700, hemoglobin 13.4, platelet  count 171,000 on May 27, 2006.  ESR was 5 mm per hour on May 28, 2006.  Protime was 13.2 seconds, PTT was 20 seconds on May 28, 2006.  Electrolytes on May 21, 2006, showed a sodium of 134, potassium 5.1,  chloride 104, bicarbonate 24, glucose 102, BUN 26, creatinine 1.3.  LFTs  on May 28, 2006, were within normal limits.  LDH was 115.  Serum  immunofixation on May 28, 2006, was within normal limits except for a  slightly low serum IgM of 38, normal being 60 to 263.  No monoclonal  protein was identified.  Serial cardiac enzymes from May 28, 2006, to  May 29, 2006, revealed a CK of 51 and then 70 and then 87 - normal.  CK-MB was 2.5 and 1.8 and 1.8, all normal, and troponin-I was 0.03 x3,  respectively.  Serum cortisol on May 28, 2006, was 14.4 - within  normal limits.  TSH was 1.173 on May 28, 2006 - normal.  Blood  cultures x2 on May 28, 2006, were negative growth after five days.   The patient was seen in consultation by Dr. Everette Rank who felt that  he likely had a vasovagal syncope with vasodilatation.  No further  symptoms were noted, and the patient was discharged home on the above  medications in improved condition and again had no significant  arrhythmias or hospitalized.      Candyce Churn, M.D.  Electronically Signed     RNG/MEDQ  D:  07/10/2006  T:  07/10/2006  Job:  [id number]

## 2010-10-05 NOTE — Discharge Summary (Signed)
Scott [name], Scott [name]                       ACCOUNT NO.:  [id number]   MEDICAL RECORD NO.:  [id number]                   PATIENT TYPE:  INP   LOCATION:  0378                                 FACILITY:  [facility]   PHYSICIAN:  Bertram Millard. Dahlstedt, M.D.          DATE OF BIRTH:  07-04-1923   DATE OF ADMISSION:  08/22/2003  DATE OF DISCHARGE:  08/24/2003                                 DISCHARGE SUMMARY   PRIMARY DIAGNOSES:  1. Benign prostatic hypertrophy.  2. Retention, specified.  3. Coronary artery disease.  4. History of depression.  5. History of gastroesophageal reflux disease.   PRINCIPAL PROCEDURE:  August 22, 2003 - Transurethral resection of the  prostate.   BRIEF HISTORY:  This is a 77 year old male with significant problems with  urination.  The patient has been on maximum medical therapy.  Despite that,  he still has significant urinary symptoms.  He also has a history of  retention.   The patient was seen in the office and evaluated.  He was found to have BPH.  It was recommended that he eventually undergo surgical management.  The  patient presents at this time for a TURP.   PAST MEDICAL HISTORY:  1. Depression.  2. GERD.  3. Hypercholesterolemia.  4. Coronary artery disease.   PAST SURGICAL HISTORY:  1. History of elbow surgery.  2. CABG times four 12 years ago.  3. Cholecystectomy.  4. Inguinal hernia repair bilaterally.   MEDICATIONS:  1. Effexor 75 mg daily.  2. Wellbutrin 150 mg daily.  3. Prilosec 20 mg daily.  4. Aspirin 81 mg a day.  5. Lipitor 10 mg a day.  6. Flomax 0.4 mg daily.   ALLERGIES:  No known drug allergies.   SOCIAL HISTORY:  The patient is married with 2 children.  He moved to  [place] to be near family.  He denies a history of tobacco use.  Denies  alcohol.  He is a retired Sales executive.   PHYSICAL EXAMINATION:  Documented in the admission note.  It was significant  for a very large prostate.   ADMISSION  DATA:  EKG revealed sinus bradycardia.  Chest x-ray revealed no  active disease with a hiatal hernia.  BMET was normal.  CBC was normal,  except for a slightly low hemoglobin of 12.8, and an RDW of 15.2.   HOSPITAL COURSE:  The patient was admitted directly to the operating room  where he underwent BPH.  This was done under a spinal anesthetic.  He was  put to overhead irrigation.  Postoperatively, he remained afebrile.  As the  urine/__________ fluid cleared, he was given a voiding trial on August 24, 2003.  He voided well, and was discharged home.   DISCHARGE MEDICATIONS:  1. He was discharged on his regular medications, except aspirin, which he     was told to restart in 2 weeks.  2. He was told to take  a stool softener.  3. Given Bactrim daily for 7 days.   ACTIVITY:  Activity restrictions were discussed.   FOLLOW UP:  He will follow up with me in 3 weeks.                                               Bertram Millard. Dahlstedt, M.D.    SMD/MEDQ  D:  09/15/2003  T:  09/15/2003  Job:  [id number]   cc:   Candyce Churn, M.D.  [address] [place]  [place] [address]  Fax: [phone]

## 2011-02-18 LAB — BASIC METABOLIC PANEL
BUN: 32 — ABNORMAL HIGH
Calcium: 9.5
GFR calc non Af Amer: 49 — ABNORMAL LOW
Glucose, Bld: 90

## 2011-02-18 LAB — POCT I-STAT 4, (NA,K, GLUC, HGB,HCT): Sodium: 139

## 2011-07-31 DIAGNOSIS — Z79899 Other long term (current) drug therapy: Secondary | ICD-10-CM | POA: Diagnosis not present

## 2011-07-31 DIAGNOSIS — D509 Iron deficiency anemia, unspecified: Secondary | ICD-10-CM | POA: Diagnosis not present

## 2011-07-31 DIAGNOSIS — I2510 Atherosclerotic heart disease of native coronary artery without angina pectoris: Secondary | ICD-10-CM | POA: Diagnosis not present

## 2011-07-31 DIAGNOSIS — L570 Actinic keratosis: Secondary | ICD-10-CM | POA: Diagnosis not present

## 2011-07-31 DIAGNOSIS — E538 Deficiency of other specified B group vitamins: Secondary | ICD-10-CM | POA: Diagnosis not present

## 2011-07-31 DIAGNOSIS — K449 Diaphragmatic hernia without obstruction or gangrene: Secondary | ICD-10-CM | POA: Diagnosis not present

## 2011-07-31 DIAGNOSIS — M19049 Primary osteoarthritis, unspecified hand: Secondary | ICD-10-CM | POA: Diagnosis not present

## 2011-07-31 DIAGNOSIS — F329 Major depressive disorder, single episode, unspecified: Secondary | ICD-10-CM | POA: Diagnosis not present

## 2011-07-31 DIAGNOSIS — M79609 Pain in unspecified limb: Secondary | ICD-10-CM | POA: Diagnosis not present

## 2011-07-31 DIAGNOSIS — I10 Essential (primary) hypertension: Secondary | ICD-10-CM | POA: Diagnosis not present

## 2011-07-31 DIAGNOSIS — E7800 Pure hypercholesterolemia, unspecified: Secondary | ICD-10-CM | POA: Diagnosis not present

## 2011-07-31 DIAGNOSIS — E559 Vitamin D deficiency, unspecified: Secondary | ICD-10-CM | POA: Diagnosis not present

## 2011-08-04 DIAGNOSIS — J209 Acute bronchitis, unspecified: Secondary | ICD-10-CM | POA: Diagnosis not present

## 2011-08-09 DIAGNOSIS — M79609 Pain in unspecified limb: Secondary | ICD-10-CM | POA: Diagnosis not present

## 2011-08-09 DIAGNOSIS — R0602 Shortness of breath: Secondary | ICD-10-CM | POA: Diagnosis not present

## 2011-08-09 DIAGNOSIS — I2510 Atherosclerotic heart disease of native coronary artery without angina pectoris: Secondary | ICD-10-CM | POA: Diagnosis not present

## 2011-08-09 DIAGNOSIS — E7800 Pure hypercholesterolemia, unspecified: Secondary | ICD-10-CM | POA: Diagnosis not present

## 2011-08-12 DIAGNOSIS — R319 Hematuria, unspecified: Secondary | ICD-10-CM | POA: Diagnosis not present

## 2011-08-14 DIAGNOSIS — R319 Hematuria, unspecified: Secondary | ICD-10-CM | POA: Diagnosis not present

## 2011-08-21 DIAGNOSIS — I10 Essential (primary) hypertension: Secondary | ICD-10-CM | POA: Diagnosis not present

## 2011-08-21 DIAGNOSIS — M79609 Pain in unspecified limb: Secondary | ICD-10-CM | POA: Diagnosis not present

## 2011-08-21 DIAGNOSIS — D509 Iron deficiency anemia, unspecified: Secondary | ICD-10-CM | POA: Diagnosis not present

## 2011-08-21 DIAGNOSIS — M19049 Primary osteoarthritis, unspecified hand: Secondary | ICD-10-CM | POA: Diagnosis not present

## 2011-08-21 DIAGNOSIS — E559 Vitamin D deficiency, unspecified: Secondary | ICD-10-CM | POA: Diagnosis not present

## 2011-08-21 DIAGNOSIS — I2510 Atherosclerotic heart disease of native coronary artery without angina pectoris: Secondary | ICD-10-CM | POA: Diagnosis not present

## 2011-08-21 DIAGNOSIS — E538 Deficiency of other specified B group vitamins: Secondary | ICD-10-CM | POA: Diagnosis not present

## 2011-08-21 DIAGNOSIS — L570 Actinic keratosis: Secondary | ICD-10-CM | POA: Diagnosis not present

## 2011-08-21 DIAGNOSIS — R319 Hematuria, unspecified: Secondary | ICD-10-CM | POA: Diagnosis not present

## 2011-10-01 DIAGNOSIS — I10 Essential (primary) hypertension: Secondary | ICD-10-CM | POA: Diagnosis not present

## 2011-10-01 DIAGNOSIS — D509 Iron deficiency anemia, unspecified: Secondary | ICD-10-CM | POA: Diagnosis not present

## 2011-10-01 DIAGNOSIS — R4586 Emotional lability: Secondary | ICD-10-CM | POA: Diagnosis not present

## 2011-10-18 DIAGNOSIS — D509 Iron deficiency anemia, unspecified: Secondary | ICD-10-CM | POA: Diagnosis not present

## 2011-10-18 DIAGNOSIS — I10 Essential (primary) hypertension: Secondary | ICD-10-CM | POA: Diagnosis not present

## 2011-10-18 DIAGNOSIS — R4586 Emotional lability: Secondary | ICD-10-CM | POA: Diagnosis not present

## 2011-10-18 DIAGNOSIS — I2510 Atherosclerotic heart disease of native coronary artery without angina pectoris: Secondary | ICD-10-CM | POA: Diagnosis not present

## 2011-11-08 DIAGNOSIS — E7800 Pure hypercholesterolemia, unspecified: Secondary | ICD-10-CM | POA: Diagnosis not present

## 2011-11-08 DIAGNOSIS — I2510 Atherosclerotic heart disease of native coronary artery without angina pectoris: Secondary | ICD-10-CM | POA: Diagnosis not present

## 2011-12-06 DIAGNOSIS — R51 Headache: Secondary | ICD-10-CM | POA: Diagnosis not present

## 2011-12-06 DIAGNOSIS — D509 Iron deficiency anemia, unspecified: Secondary | ICD-10-CM | POA: Diagnosis not present

## 2012-02-28 DIAGNOSIS — Z23 Encounter for immunization: Secondary | ICD-10-CM | POA: Diagnosis not present

## 2012-03-03 DIAGNOSIS — D509 Iron deficiency anemia, unspecified: Secondary | ICD-10-CM | POA: Diagnosis not present

## 2012-03-03 DIAGNOSIS — I10 Essential (primary) hypertension: Secondary | ICD-10-CM | POA: Diagnosis not present

## 2012-03-03 DIAGNOSIS — E7800 Pure hypercholesterolemia, unspecified: Secondary | ICD-10-CM | POA: Diagnosis not present

## 2012-03-03 DIAGNOSIS — Z0000 Encounter for general adult medical examination without abnormal findings: Secondary | ICD-10-CM | POA: Diagnosis not present

## 2012-03-03 DIAGNOSIS — K4091 Unilateral inguinal hernia, without obstruction or gangrene, recurrent: Secondary | ICD-10-CM | POA: Diagnosis not present

## 2012-03-03 DIAGNOSIS — I2510 Atherosclerotic heart disease of native coronary artery without angina pectoris: Secondary | ICD-10-CM | POA: Diagnosis not present

## 2012-03-03 DIAGNOSIS — Z1331 Encounter for screening for depression: Secondary | ICD-10-CM | POA: Diagnosis not present

## 2012-03-17 ENCOUNTER — Encounter (INDEPENDENT_AMBULATORY_CARE_PROVIDER_SITE_OTHER): Payer: Self-pay | Admitting: General Surgery

## 2012-03-18 DIAGNOSIS — I10 Essential (primary) hypertension: Secondary | ICD-10-CM | POA: Diagnosis not present

## 2012-03-19 ENCOUNTER — Ambulatory Visit (INDEPENDENT_AMBULATORY_CARE_PROVIDER_SITE_OTHER): Payer: Medicare Other | Admitting: General Surgery

## 2012-03-19 ENCOUNTER — Encounter (INDEPENDENT_AMBULATORY_CARE_PROVIDER_SITE_OTHER): Payer: Self-pay | Admitting: General Surgery

## 2012-03-19 DIAGNOSIS — K4091 Unilateral inguinal hernia, without obstruction or gangrene, recurrent: Secondary | ICD-10-CM

## 2012-03-19 NOTE — Patient Instructions (Signed)
You have a recurrent right inguinal hernia, which is moderate in size.  You have been advised to consider elective repair of this hernia with mesh. I would attempt to do this laparoscopically, which would probably work fine. There is a small chance she might have to have the surgery converted to an open repair with mesh.  You have stated that you want to go home and think about this. Please call Dr. Derrell Lolling when you are ready to consider surgery.   Hernia Repair with Laparoscope A hernia occurs when an internal organ pushes out through a weak spot in the belly (abdominal) wall muscles. Hernias most commonly occur in the groin and around the navel. Hernias can also occur through a cut by the surgeon (incision) after an abdominal operation. A hernia may be caused by:  Lifting heavy objects.  Prolonged coughing.  Straining to move your bowels. Hernias can often be pushed back into place (reduced). Most hernias tend to get worse over time. Problems occur when abdominal contents get stuck in the opening and the blood supply is blocked or impaired (incarcerated hernia). Because of these risks, you require surgery to repair the hernia. Your hernia will be repaired using a laparoscope. Laparoscopic surgery is a type of minimally invasive surgery. It does not involve making a typical surgical cut (incision) in the skin. A laparoscope is a telescope-like rod and lens system. It is usually connected to a video camera and a light source so your caregiver can clearly see the operative area. The instruments are inserted through  to  inch (5 mm or 10 mm) openings in the skin at specific locations. A working and viewing space is created by blowing a small amount of carbon dioxide gas into the abdominal cavity. The abdomen is essentially blown up like a balloon (insufflated). This elevates the abdominal wall above the internal organs like a dome. The carbon dioxide gas is common to the human body and can be absorbed  by tissue and removed by the respiratory system. Once the repair is completed, the small incisions will be closed with either stitches (sutures) or staples (just like a paper stapler only this staple holds the skin together). LET YOUR CAREGIVERS KNOW ABOUT:  Allergies.  Medications taken including herbs, eye drops, over the counter medications, and creams.  Use of steroids (by mouth or creams).  Previous problems with anesthetics or Novocaine.  Possibility of pregnancy, if this applies.  History of blood clots (thrombophlebitis).  History of bleeding or blood problems.  Previous surgery.  Other health problems. BEFORE THE PROCEDURE  Laparoscopy can be done either in a hospital or out-patient clinic. You may be given a mild sedative to help you relax before the procedure. Once in the operating room, you will be given a general anesthesia to make you sleep (unless you and your caregiver choose a different anesthetic).  AFTER THE PROCEDURE  After the procedure you will be watched in a recovery area. Depending on what type of hernia was repaired, you might be admitted to the hospital or you might go home the same day. With this procedure you may have less pain and scarring. This usually results in a quicker recovery and less risk of infection. HOME CARE INSTRUCTIONS   Bed rest is not required. You may continue your normal activities but avoid heavy lifting (more than 10 pounds) or straining.  Cough gently. If you are a smoker it is best to stop, as even the best hernia repair can break down  with the continual strain of coughing.  Avoid driving until given the OK by your surgeon.  There are no dietary restrictions unless given otherwise.  TAKE ALL MEDICATIONS AS DIRECTED.  Only take over-the-counter or prescription medicines for pain, discomfort, or fever as directed by your caregiver. SEEK MEDICAL CARE IF:   There is increasing abdominal pain or pain in your incisions.  There is  more bleeding from incisions, other than minimal spotting.  You feel light headed or faint.  You develop an unexplained fever, chills, and/or an oral temperature above 102 F (38.9 C).  You have redness, swelling, or increasing pain in the wound.  Pus coming from wound.  A foul smell coming from the wound or dressings. SEEK IMMEDIATE MEDICAL CARE IF:   You develop a rash.  You have difficulty breathing.  You have any allergic problems. MAKE SURE YOU:   Understand these instructions.  Will watch your condition.  Will get help right away if you are not doing well or get worse. Document Released: 05/06/2005 Document Revised: 07/29/2011 Document Reviewed: 04/05/2009 [facility] Patient Information 2013 [place], [place].

## 2012-03-19 NOTE — Progress Notes (Signed)
Patient ID: Scott [name], male   DOB: Nov 02, 1923, 78 y.o.   MRN: [id number]  Chief Complaint  Patient presents with  . New Evaluation    ing. hernia    HPI Scott [name] is a 78 y.o. male.  He is referred by Dr. Johnella Moloney for evaluation of a recurrent right inguinal hernia.  This patient states that he had right inguinal hernia repair with mesh in [facility] approximately 18 years ago. He had left inguinal  hernia repair with a mesh plug about 20 years ago, also in [place]. He has had a cholecystectomy, probably laparoscopic. He has had CABG 1983 and has done well since that time. He's had TURP twice. He's had colonoscopy for polyps.  He states he's noticed a bulge in his right groin for 10 years. Recently this got much larger but did not extend into the scrotum. Minimal pain. No alteration of bowel habits. Never incarcerated. Because of the recent episode of enlargement he wanted to be evaluated and considered for elective surgery.  In general his health is stable and he is doing very well considering his age. He is followed by Dr.Varanasi for cardiac issues to every 6 months but really doesn't have any chest pain or shortness of breath. He has seen Dr. Delton Coombes at [facility] pulmonary in the past. HPI  Past Medical History  Diagnosis Date  . Hypertension   . Anxiety   . Depression   . GERD (gastroesophageal reflux disease)   . Hyperlipidemia   . CAD (coronary artery disease)     with CABG in 1983  . Iron deficiency anemia   . Erectile dysfunction   . Vasovagal syncope 05/2006  . Hx of adenomatous colonic polyps 2007  . Sigmoid diverticulosis   . Dyspnea 2010    syndrome - extensive  . Weight loss   . Urethral stricture 11/2008  . Shingles 1990's  . Inguinal hernia     No past surgical history on file.  No family history on file.  Social History History  Substance Use Topics  . Smoking status: Never Smoker   . Smokeless tobacco: Never Used  . Alcohol  Use: Yes     wine - daily    No Known Allergies  Current Outpatient Prescriptions  Medication Sig Dispense Refill  . aspirin 81 MG chewable tablet Chew 81 mg by mouth 2 (two) times a week.      [name] buPROPion (WELLBUTRIN XL) 150 MG 24 hr tablet Take 150 mg by mouth daily.      [name] desvenlafaxine (PRISTIQ) 50 MG 24 hr tablet Take 50 mg by mouth daily.      [name] esomeprazole (NEXIUM) 40 MG packet Take 40 mg by mouth daily before breakfast.      . ferrous sulfate (KP FERROUS SULFATE) 325 (65 FE) MG tablet Take 325 mg by mouth daily.      . isosorbide mononitrate (IMDUR) 30 MG 24 hr tablet Take 30 mg by mouth daily.      [name] lisinopril (PRINIVIL,ZESTRIL) 10 MG tablet Take 10 mg by mouth daily.      . metoprolol succinate (TOPROL-XL) 25 MG 24 hr tablet Take 25 mg by mouth daily.      . Omega-3 Fatty Acids (FISH OIL) 1000 MG CAPS Take by mouth as needed.      . rosuvastatin (CRESTOR) 10 MG tablet Take 10 mg by mouth daily.      . vitamin B-12 (CYANOCOBALAMIN) 1000 MCG tablet Take 1,000 mcg  by mouth daily.        Review of Systems Review of Systems  Constitutional: Negative for fever, chills and unexpected weight change.  HENT: Negative for hearing loss, congestion, sore throat, trouble swallowing and voice change.   Eyes: Negative for visual disturbance.  Respiratory: Negative for cough and wheezing.   Cardiovascular: Negative for chest pain, palpitations and leg swelling.  Gastrointestinal: Negative for nausea, vomiting, abdominal pain, diarrhea, constipation, blood in stool, abdominal distention, anal bleeding and rectal pain.  Genitourinary: Negative for hematuria and difficulty urinating.  Musculoskeletal: Negative for arthralgias.  Skin: Negative for rash and wound.  Neurological: Negative for seizures, syncope, weakness and headaches.  Hematological: Negative for adenopathy. Does not bruise/bleed easily.  Psychiatric/Behavioral: Negative for confusion.    There were no vitals taken for  this visit.  Physical Exam Physical Exam  Constitutional: He is oriented to person, place, and time. He appears well-developed and well-nourished. No distress.  HENT:  Head: Normocephalic.  Nose: Nose normal.  Mouth/Throat: No oropharyngeal exudate.  Eyes: Conjunctivae normal and EOM are normal. Pupils are equal, round, and reactive to light. Right eye exhibits no discharge. Left eye exhibits no discharge. No scleral icterus.  Neck: Normal range of motion. Neck supple. No JVD present. No tracheal deviation present. No thyromegaly present.  Cardiovascular: Normal rate, regular rhythm, normal heart sounds and intact distal pulses.   No murmur heard. Pulmonary/Chest: Effort normal and breath sounds normal. No stridor. No respiratory distress. He has no wheezes. He has no rales. He exhibits no tenderness.  Abdominal: Soft. Bowel sounds are normal. He exhibits no distension and no mass. There is no tenderness. There is no rebound and no guarding.       Chest tube scars in epigastric site. 2 small incisions right upper quadrant possibly laparoscopic trocar sites. Possible tiny pinpoint umbilical hernia. I cannot really see an incision at his umbilicus. No organomegaly  Genitourinary:       Bilateral groin scars. Obvious right inguinal hernia, sac at least 5 cm diameter, does not extend into the scrotum. Easily reducible. No hernia on the left. No scrotal mass.  Musculoskeletal: Normal range of motion. He exhibits no edema and no tenderness.  Lymphadenopathy:    He has no cervical adenopathy.  Neurological: He is alert and oriented to person, place, and time. He has normal reflexes. Coordination normal.  Skin: Skin is warm and dry. No rash noted. He is not diaphoretic. No erythema. No pallor.  Psychiatric: He has a normal mood and affect. His behavior is normal. Judgment and thought content normal.    Data Reviewed Office notes from Dr. Kevan Ny  Assessment    .Recurrent right inguinal hernia. I  think that the repair can be attempted laparoscopically, may have to be converted to open.  History bilateral open inguinal hernia repairs with mesh and passed, details unknown  Coronary artery disease, status post CABG  Benign prostatic hypertrophy, status post TURP 2005 and 2009  Hypertension  Mild anxiety  Hiatal hernia and GERD  History laparoscopic cholecystectomy  History colonoscopy and polypectomy.    Plan    We had a long discussion about the presence of the hernia, his natural history, and the clear indication for elective repair. We discussed laparoscopic approach and the open approach with diagrams and booklets. I told him my bias was to attempt to do this laparoscopically since it was a recurrent hernia. He clearly understands might have to be converted to open the scar tissue is prohibitive.  He thinks that he was to have this done at some point in time, but wants to go home and think about this.  He did not want to schedule a followup appointment, but will call me when he is ready.       Angelia Mould. Derrell Lolling, M.D., [facility] General and Minimally invasive Surgery Breast and Colorectal Surgery Office:   [phone] Pager:   [phone]  03/19/2012, 10:04 AM

## 2012-04-20 DIAGNOSIS — I2510 Atherosclerotic heart disease of native coronary artery without angina pectoris: Secondary | ICD-10-CM | POA: Diagnosis not present

## 2012-04-20 DIAGNOSIS — E7800 Pure hypercholesterolemia, unspecified: Secondary | ICD-10-CM | POA: Diagnosis not present

## 2012-04-20 DIAGNOSIS — I10 Essential (primary) hypertension: Secondary | ICD-10-CM | POA: Diagnosis not present

## 2012-05-20 DIAGNOSIS — Z9289 Personal history of other medical treatment: Secondary | ICD-10-CM

## 2012-05-20 DIAGNOSIS — K922 Gastrointestinal hemorrhage, unspecified: Secondary | ICD-10-CM

## 2012-05-20 HISTORY — DX: Personal history of other medical treatment: Z92.89

## 2012-05-20 HISTORY — DX: Gastrointestinal hemorrhage, unspecified: K92.2

## 2012-07-07 DIAGNOSIS — E7800 Pure hypercholesterolemia, unspecified: Secondary | ICD-10-CM | POA: Diagnosis not present

## 2012-07-07 DIAGNOSIS — M19049 Primary osteoarthritis, unspecified hand: Secondary | ICD-10-CM | POA: Diagnosis not present

## 2012-07-07 DIAGNOSIS — I10 Essential (primary) hypertension: Secondary | ICD-10-CM | POA: Diagnosis not present

## 2012-07-07 DIAGNOSIS — I2510 Atherosclerotic heart disease of native coronary artery without angina pectoris: Secondary | ICD-10-CM | POA: Diagnosis not present

## 2012-07-07 DIAGNOSIS — H9190 Unspecified hearing loss, unspecified ear: Secondary | ICD-10-CM | POA: Diagnosis not present

## 2012-07-07 DIAGNOSIS — K4091 Unilateral inguinal hernia, without obstruction or gangrene, recurrent: Secondary | ICD-10-CM | POA: Diagnosis not present

## 2012-07-07 DIAGNOSIS — D509 Iron deficiency anemia, unspecified: Secondary | ICD-10-CM | POA: Diagnosis not present

## 2012-07-11 ENCOUNTER — Emergency Department (HOSPITAL_COMMUNITY)
Admission: EM | Admit: 2012-07-11 | Discharge: 2012-07-11 | Disposition: A | Discharge status: Home / Self Care | Payer: Medicare Other | Attending: Emergency Medicine | Admitting: Emergency Medicine

## 2012-07-11 ENCOUNTER — Emergency Department (HOSPITAL_COMMUNITY)
Admission: EM | Admit: 2012-07-11 | Discharge: 2012-07-11 | Disposition: A | Discharge status: Nursing Home (Includes ICF) | Payer: Medicare Other | Source: Home / Self Care | Attending: Family Medicine | Admitting: Family Medicine

## 2012-07-11 ENCOUNTER — Encounter (HOSPITAL_COMMUNITY): Payer: Self-pay | Admitting: Nurse Practitioner

## 2012-07-11 ENCOUNTER — Emergency Department (HOSPITAL_COMMUNITY): Payer: Medicare Other

## 2012-07-11 ENCOUNTER — Encounter (HOSPITAL_COMMUNITY): Payer: Self-pay | Admitting: Emergency Medicine

## 2012-07-11 DIAGNOSIS — Z8659 Personal history of other mental and behavioral disorders: Secondary | ICD-10-CM | POA: Insufficient documentation

## 2012-07-11 DIAGNOSIS — Z87448 Personal history of other diseases of urinary system: Secondary | ICD-10-CM | POA: Insufficient documentation

## 2012-07-11 DIAGNOSIS — Z860100 Personal history of colon polyps, unspecified: Secondary | ICD-10-CM | POA: Insufficient documentation

## 2012-07-11 DIAGNOSIS — W010XXA Fall on same level from slipping, tripping and stumbling without subsequent striking against object, initial encounter: Secondary | ICD-10-CM | POA: Insufficient documentation

## 2012-07-11 DIAGNOSIS — S0990XA Unspecified injury of head, initial encounter: Secondary | ICD-10-CM | POA: Insufficient documentation

## 2012-07-11 DIAGNOSIS — S0180XA Unspecified open wound of other part of head, initial encounter: Secondary | ICD-10-CM

## 2012-07-11 DIAGNOSIS — Y9289 Other specified places as the place of occurrence of the external cause: Secondary | ICD-10-CM | POA: Insufficient documentation

## 2012-07-11 DIAGNOSIS — E785 Hyperlipidemia, unspecified: Secondary | ICD-10-CM | POA: Insufficient documentation

## 2012-07-11 DIAGNOSIS — Z79899 Other long term (current) drug therapy: Secondary | ICD-10-CM | POA: Diagnosis not present

## 2012-07-11 DIAGNOSIS — D509 Iron deficiency anemia, unspecified: Secondary | ICD-10-CM | POA: Insufficient documentation

## 2012-07-11 DIAGNOSIS — I2510 Atherosclerotic heart disease of native coronary artery without angina pectoris: Secondary | ICD-10-CM | POA: Insufficient documentation

## 2012-07-11 DIAGNOSIS — Z8619 Personal history of other infectious and parasitic diseases: Secondary | ICD-10-CM | POA: Diagnosis not present

## 2012-07-11 DIAGNOSIS — K219 Gastro-esophageal reflux disease without esophagitis: Secondary | ICD-10-CM | POA: Diagnosis not present

## 2012-07-11 DIAGNOSIS — Z7982 Long term (current) use of aspirin: Secondary | ICD-10-CM | POA: Insufficient documentation

## 2012-07-11 DIAGNOSIS — Z8719 Personal history of other diseases of the digestive system: Secondary | ICD-10-CM | POA: Diagnosis not present

## 2012-07-11 DIAGNOSIS — Z23 Encounter for immunization: Secondary | ICD-10-CM | POA: Diagnosis not present

## 2012-07-11 DIAGNOSIS — Y9301 Activity, walking, marching and hiking: Secondary | ICD-10-CM | POA: Insufficient documentation

## 2012-07-11 DIAGNOSIS — I10 Essential (primary) hypertension: Secondary | ICD-10-CM | POA: Insufficient documentation

## 2012-07-11 DIAGNOSIS — S01409A Unspecified open wound of unspecified cheek and temporomandibular area, initial encounter: Secondary | ICD-10-CM | POA: Diagnosis not present

## 2012-07-11 MED ORDER — TETANUS-DIPHTH-ACELL PERTUSSIS 5-2.5-18.5 LF-MCG/0.5 IM SUSP
INTRAMUSCULAR | Status: AC
  Filled 2012-07-11: qty 0.5

## 2012-07-11 MED ORDER — TETANUS-DIPHTH-ACELL PERTUSSIS 5-2.5-18.5 LF-MCG/0.5 IM SUSP
0.5000 mL | Freq: Once | INTRAMUSCULAR | Status: AC
  Administered 2012-07-11: 0.5 mL via INTRAMUSCULAR

## 2012-07-11 NOTE — ED Notes (Signed)
Pt states that he was walking through the woods and tripped and fell hitting his face on a log. Laceration to the right side of face slightly above cheek.

## 2012-07-11 NOTE — ED Provider Notes (Signed)
History     CSN: [id number]  Arrival date & time 07/11/12  1500   First MD Initiated Contact with Patient 07/11/12 1550      Chief Complaint  Patient presents with  . Facial Laceration    (Consider location/radiation/quality/duration/timing/severity/associated sxs/prior treatment) Patient is a 79 y.o. male presenting with facial injury. The history is provided by the patient and the spouse. No language interpreter was used.  Facial Injury  The incident occurred just prior to arrival. The injury mechanism was a fall and a cut/puncture wound. The wounds were not self-inflicted. No protective equipment was used. There is an injury to the face. The pain is mild. It is suspected that a foreign body is present. Pertinent negatives include no inability to bear weight, no neck pain, no focal weakness, no decreased responsiveness, no light-headedness, no loss of consciousness, no seizures, no tingling, no weakness and no memory loss. There have been prior injuries to these areas. His tetanus status is UTD. He has been behaving normally.    Past Medical History  Diagnosis Date  . Hypertension   . Anxiety   . Depression   . GERD (gastroesophageal reflux disease)   . Hyperlipidemia   . CAD (coronary artery disease)     with CABG in 1983  . Iron deficiency anemia   . Erectile dysfunction   . Vasovagal syncope 05/2006  . Hx of adenomatous colonic polyps 2007  . Sigmoid diverticulosis   . Dyspnea 2010    syndrome - extensive  . Weight loss   . Urethral stricture 11/2008  . Shingles 1990's  . Inguinal hernia     History reviewed. No pertinent past surgical history.  No family history on file.  History  Substance Use Topics  . Smoking status: Never Smoker   . Smokeless tobacco: Never Used  . Alcohol Use: Yes     Comment: wine - daily      Review of Systems  Constitutional: Negative for decreased responsiveness.  HENT: Negative for neck pain.   Neurological: Negative for  tingling, focal weakness, seizures, loss of consciousness, weakness and light-headedness.  Psychiatric/Behavioral: Negative for memory loss.  All other systems reviewed and are negative.    Allergies  Review of patient's allergies indicates no known allergies.  Home Medications   Current Outpatient Rx  Name  Route  Sig  Dispense  Refill  . aspirin 81 MG chewable tablet   Oral   Chew 81 mg by mouth 2 (two) times a week.         [name] buPROPion (WELLBUTRIN XL) 150 MG 24 hr tablet   Oral   Take 150 mg by mouth daily.         [name] desvenlafaxine (PRISTIQ) 50 MG 24 hr tablet   Oral   Take 50 mg by mouth daily.         [name] esomeprazole (NEXIUM) 40 MG packet   Oral   Take 40 mg by mouth daily before breakfast.         . ferrous sulfate (KP FERROUS SULFATE) 325 (65 FE) MG tablet   Oral   Take 325 mg by mouth daily.         . isosorbide mononitrate (IMDUR) 30 MG 24 hr tablet   Oral   Take 30 mg by mouth daily.         [name] lisinopril (PRINIVIL,ZESTRIL) 10 MG tablet   Oral   Take 10 mg by mouth daily.         [name]  metoprolol succinate (TOPROL-XL) 25 MG 24 hr tablet   Oral   Take 25 mg by mouth daily.         . Omega-3 Fatty Acids (FISH OIL) 1000 MG CAPS   Oral   Take by mouth as needed.         . rosuvastatin (CRESTOR) 10 MG tablet   Oral   Take 10 mg by mouth daily.         . vitamin B-12 (CYANOCOBALAMIN) 1000 MCG tablet   Oral   Take 1,000 mcg by mouth daily.           BP 144/61  Pulse 63  Temp(Src) 97.9 F (36.6 C) (Oral)  Resp 20  SpO2 100%  Physical Exam  Nursing note and vitals reviewed. Constitutional: He is oriented to person, place, and time. He appears well-developed and well-nourished.  HENT:  Head: Head is with laceration.    Eyes: Conjunctivae and EOM are normal. Pupils are equal, round, and reactive to light.  Neck: Normal range of motion. Neck supple.  Cardiovascular: Normal rate, regular rhythm, normal heart sounds and intact  distal pulses.   Pulmonary/Chest: Effort normal.  Abdominal: Soft. Bowel sounds are normal. There is no tenderness.  Musculoskeletal: Normal range of motion.  Neurological: He is alert and oriented to person, place, and time.  Skin: Skin is warm and dry.  Psychiatric: He has a normal mood and affect. His behavior is normal. Judgment and thought content normal.    ED Course  Procedures (including critical care time)  Labs Reviewed - No data to display No results found.   No diagnosis found.    MDM  Please see Dr. Elio Forget procedure note regarding repair of laceration.        Hilario Quarry, MD 07/12/12 [phone]

## 2012-07-11 NOTE — ED Notes (Signed)
Resident at the bedside pt.'s laceration being sutured

## 2012-07-11 NOTE — ED Notes (Signed)
Pt sent from [facility]  For suture of rt sided head wound  Was walking in woods and tripped over log  No loc

## 2012-07-11 NOTE — ED Provider Notes (Signed)
History     CSN: [id number]  Arrival date & time 07/11/12  1500   First MD Initiated Contact with Patient 07/11/12 1550      Chief Complaint  Patient presents with  . Facial Laceration    (Consider location/radiation/quality/duration/timing/severity/associated sxs/prior treatment) HPI  Past Medical History  Diagnosis Date  . Hypertension   . Anxiety   . Depression   . GERD (gastroesophageal reflux disease)   . Hyperlipidemia   . CAD (coronary artery disease)     with CABG in 1983  . Iron deficiency anemia   . Erectile dysfunction   . Vasovagal syncope 05/2006  . Hx of adenomatous colonic polyps 2007  . Sigmoid diverticulosis   . Dyspnea 2010    syndrome - extensive  . Weight loss   . Urethral stricture 11/2008  . Shingles 1990's  . Inguinal hernia     History reviewed. No pertinent past surgical history.  No family history on file.  History  Substance Use Topics  . Smoking status: Never Smoker   . Smokeless tobacco: Never Used  . Alcohol Use: Yes     Comment: wine - daily      Review of Systems  Allergies  Review of patient's allergies indicates no known allergies.  Home Medications   Current Outpatient Rx  Name  Route  Sig  Dispense  Refill  . aspirin 81 MG chewable tablet   Oral   Chew 81 mg by mouth 2 (two) times a week.         [name] buPROPion (WELLBUTRIN XL) 150 MG 24 hr tablet   Oral   Take 150 mg by mouth daily.         [name] desvenlafaxine (PRISTIQ) 50 MG 24 hr tablet   Oral   Take 50 mg by mouth daily.         [name] esomeprazole (NEXIUM) 40 MG packet   Oral   Take 40 mg by mouth daily before breakfast.         . ferrous sulfate (KP FERROUS SULFATE) 325 (65 FE) MG tablet   Oral   Take 325 mg by mouth daily.         . isosorbide mononitrate (IMDUR) 30 MG 24 hr tablet   Oral   Take 30 mg by mouth daily.         [name] lisinopril (PRINIVIL,ZESTRIL) 10 MG tablet   Oral   Take 10 mg by mouth daily.         . metoprolol succinate  (TOPROL-XL) 25 MG 24 hr tablet   Oral   Take 25 mg by mouth daily.         . Omega-3 Fatty Acids (FISH OIL) 1000 MG CAPS   Oral   Take by mouth as needed.         . rosuvastatin (CRESTOR) 10 MG tablet   Oral   Take 10 mg by mouth daily.         . vitamin B-12 (CYANOCOBALAMIN) 1000 MCG tablet   Oral   Take 1,000 mcg by mouth daily.           BP 144/61  Pulse 63  Temp(Src) 97.9 F (36.6 C) (Oral)  Resp 20  SpO2 100%  Physical Exam  ED Course  LACERATION REPAIR Date/Time: 07/11/2012 5:41 PM Performed by: Toy Cookey Authorized by: Toy Cookey Consent: Verbal consent obtained. Risks and benefits: risks, benefits and alternatives were discussed Consent given by: patient Patient understanding: patient states understanding  of the procedure being performed Patient consent: the patient's understanding of the procedure matches consent given Procedure consent: procedure consent matches procedure scheduled Relevant documents: relevant documents present and verified Test results: test results available and properly labeled Site marked: the operative site was marked Imaging studies: imaging studies available Required items: required blood products, implants, devices, and special equipment available Patient identity confirmed: verbally with patient Body area: head/neck Location details: right cheek Laceration length: 5 cm Contamination: The wound is contaminated. Foreign bodies: wood Tendon involvement: none Nerve involvement: none Vascular damage: no Anesthesia: local infiltration Local anesthetic: lidocaine 1% with epinephrine Anesthetic total: 4 ml Patient sedated: no Preparation: Patient was prepped and draped in the usual sterile fashion. Irrigation solution: saline Irrigation method: syringe Amount of cleaning: extensive Debridement: moderate Skin closure: 5-0 Prolene Number of sutures: 10 Technique: simple Approximation: close Approximation  difficulty: simple Dressing: antibiotic ointment Patient tolerance: Patient tolerated the procedure well with no immediate complications.   (including critical care time)  Labs Reviewed - No data to display Ct Head Wo Contrast  07/11/2012  *RADIOLOGY REPORT*  Clinical Data: Fall  CT HEAD WITHOUT CONTRAST,CT MAXILLOFACIAL WITHOUT CONTRAST  Technique:  Contiguous axial images were obtained from the base of the skull through the vertex without contrast.,Technique: Multidetector CT imaging of the maxillofacial structures was performed. Multiplanar CT image reconstructions were also generated.  Comparison: None  Findings: No skull fracture is noted.  Paranasal sinuses and mastoid air cells are unremarkable.  No intracranial hemorrhage, mass effect or midline shift.  No acute infarction.  Bilateral basal ganglia punctate calcifications are noted.  Mild cerebral atrophy.  Periventricular and patchy subcortical white matter decreased attenuation is probable due to chronic small vessel ischemic changes.  No mass lesion is noted on this unenhanced scan.  IMPRESSION:  1.  No acute intracranial abnormality.  Mild cerebral atrophy. Periventricular and patchy subcortical white matter decreased attenuation probable due to chronic small vessel ischemic changes.  CT maxillofacial without IV contrast  Axial images shows no acute fracture or subluxation.  Mild mucosal thickening inferior aspect maxillary sinus.  No nasal bone fracture is noted.  There is skin laceration and soft tissue defect in the right face zygomatic region.  No zygomatic fracture is noted.  Coronal images shows mild left deviation of the nasal bony septum. Bilateral semilunar canal is patent.  No orbital rim or orbital floor fracture.  There is no mandibular fracture.  No TMJ dislocation. Mild mucosal thickening with air-fluid level posterior aspect of the right sphenoid sinus.  Impression: 1.  No facial fractures are noted.  No orbital hematoma. 2.  There  is skin laceration and soft tissue defect in the right face zygomatic region.  No zygomatic bone fracture. 3.  Mild mucosal thickening inferior aspect bilateral maxillary sinus.  There is mild mucosal thickening with air-fluid level posterior aspect of the right sphenoid sinus.   Original Report Authenticated By: Natasha Mead, M.D.    Ct Maxillofacial Wo Cm  07/11/2012  *RADIOLOGY REPORT*  Clinical Data: Fall  CT HEAD WITHOUT CONTRAST,CT MAXILLOFACIAL WITHOUT CONTRAST  Technique:  Contiguous axial images were obtained from the base of the skull through the vertex without contrast.,Technique: Multidetector CT imaging of the maxillofacial structures was performed. Multiplanar CT image reconstructions were also generated.  Comparison: None  Findings: No skull fracture is noted.  Paranasal sinuses and mastoid air cells are unremarkable.  No intracranial hemorrhage, mass effect or midline shift.  No acute infarction.  Bilateral basal ganglia punctate  calcifications are noted.  Mild cerebral atrophy.  Periventricular and patchy subcortical white matter decreased attenuation is probable due to chronic small vessel ischemic changes.  No mass lesion is noted on this unenhanced scan.  IMPRESSION:  1.  No acute intracranial abnormality.  Mild cerebral atrophy. Periventricular and patchy subcortical white matter decreased attenuation probable due to chronic small vessel ischemic changes.  CT maxillofacial without IV contrast  Axial images shows no acute fracture or subluxation.  Mild mucosal thickening inferior aspect maxillary sinus.  No nasal bone fracture is noted.  There is skin laceration and soft tissue defect in the right face zygomatic region.  No zygomatic fracture is noted.  Coronal images shows mild left deviation of the nasal bony septum. Bilateral semilunar canal is patent.  No orbital rim or orbital floor fracture.  There is no mandibular fracture.  No TMJ dislocation. Mild mucosal thickening with air-fluid level  posterior aspect of the right sphenoid sinus.  Impression: 1.  No facial fractures are noted.  No orbital hematoma. 2.  There is skin laceration and soft tissue defect in the right face zygomatic region.  No zygomatic bone fracture. 3.  Mild mucosal thickening inferior aspect bilateral maxillary sinus.  There is mild mucosal thickening with air-fluid level posterior aspect of the right sphenoid sinus.   Original Report Authenticated By: Natasha Mead, M.D.      No diagnosis found.    MDM  5:43 PM Laceration repaired by myself.  Please see Dr. Denny Levy note.  1. Facial laceration, initial encounter            Toy Cookey, MD 07/11/12 1745

## 2012-07-11 NOTE — ED Provider Notes (Signed)
History     CSN: [id number]  Arrival date & time 07/11/12  1400   First MD Initiated Contact with Patient 07/11/12 1407      Chief Complaint  Patient presents with  . Facial Laceration    tripped falling face first hitting face on stump.     (Consider location/radiation/quality/duration/timing/severity/associated sxs/prior treatment) Patient is a 80 y.o. male presenting with scalp laceration. The history is provided by the patient and the spouse.  Head Laceration This is a new problem. The current episode started 1 to 2 hours ago (tripped on log in woods and struck face with lac to right cheek., no loc.). The problem has not changed since onset.   Past Medical History  Diagnosis Date  . Hypertension   . Anxiety   . Depression   . GERD (gastroesophageal reflux disease)   . Hyperlipidemia   . CAD (coronary artery disease)     with CABG in 1983  . Iron deficiency anemia   . Erectile dysfunction   . Vasovagal syncope 05/2006  . Hx of adenomatous colonic polyps 2007  . Sigmoid diverticulosis   . Dyspnea 2010    syndrome - extensive  . Weight loss   . Urethral stricture 11/2008  . Shingles 1990's  . Inguinal hernia     History reviewed. No pertinent past surgical history.  History reviewed. No pertinent family history.  History  Substance Use Topics  . Smoking status: Never Smoker   . Smokeless tobacco: Never Used  . Alcohol Use: Yes     Comment: wine - daily      Review of Systems  Constitutional: Negative.   HENT: Negative for facial swelling.     Allergies  Review of patient's allergies indicates no known allergies.  Home Medications   Current Outpatient Rx  Name  Route  Sig  Dispense  Refill  . aspirin 81 MG chewable tablet   Oral   Chew 81 mg by mouth 2 (two) times a week.         [name] buPROPion (WELLBUTRIN XL) 150 MG 24 hr tablet   Oral   Take 150 mg by mouth daily.         [name] desvenlafaxine (PRISTIQ) 50 MG 24 hr tablet   Oral   Take 50 mg  by mouth daily.         [name] esomeprazole (NEXIUM) 40 MG packet   Oral   Take 40 mg by mouth daily before breakfast.         . ferrous sulfate (KP FERROUS SULFATE) 325 (65 FE) MG tablet   Oral   Take 325 mg by mouth daily.         . isosorbide mononitrate (IMDUR) 30 MG 24 hr tablet   Oral   Take 30 mg by mouth daily.         [name] lisinopril (PRINIVIL,ZESTRIL) 10 MG tablet   Oral   Take 10 mg by mouth daily.         . metoprolol succinate (TOPROL-XL) 25 MG 24 hr tablet   Oral   Take 25 mg by mouth daily.         . Omega-3 Fatty Acids (FISH OIL) 1000 MG CAPS   Oral   Take by mouth as needed.         . rosuvastatin (CRESTOR) 10 MG tablet   Oral   Take 10 mg by mouth daily.         . vitamin B-12 (CYANOCOBALAMIN)  1000 MCG tablet   Oral   Take 1,000 mcg by mouth daily.           BP 143/73  Pulse 59  Temp(Src) 97.9 F (36.6 C) (Oral)  Resp 16  SpO2 98%  Physical Exam  Nursing note and vitals reviewed. Constitutional: He is oriented to person, place, and time. He appears well-developed and well-nourished.  HENT:  Head: Normocephalic.  Eyes: Conjunctivae are normal. Pupils are equal, round, and reactive to light.  Neck: Normal range of motion. Neck supple.  Neurological: He is alert and oriented to person, place, and time.  Skin: Skin is warm and dry.       ED Course  Procedures (including critical care time)  Labs Reviewed - No data to display No results found.   1. Facial laceration, initial encounter       MDM  Sent for lac repair ,dirty,  will require too much time for proper closure at [facility]. tdap given.       Linna Hoff, MD 07/11/12 [phone]

## 2012-07-11 NOTE — ED Notes (Signed)
Per ucc note wound needs cleansing and suturing more than they can do

## 2012-07-11 NOTE — ED Notes (Signed)
Ice pak applied 

## 2012-07-12 NOTE — ED Provider Notes (Signed)
  I performed a history and physical examination of Scott [name] and discussed his management with Dr. Micheline Maze.  I agree with the history, physical, assessment, and plan of care, with the following exceptions: None  I was present for the following procedures: None Time Spent in Critical Care of the patient: None Time spent in discussions with the patient and family: 30   Please see my complete note.  I was present for and supervised Dr. Elio Forget repair of laceration.   Holli Humbles, MD 07/12/12 [phone]

## 2012-07-21 DIAGNOSIS — Z4802 Encounter for removal of sutures: Secondary | ICD-10-CM | POA: Diagnosis not present

## 2012-08-14 DIAGNOSIS — J209 Acute bronchitis, unspecified: Secondary | ICD-10-CM | POA: Diagnosis not present

## 2012-08-15 ENCOUNTER — Emergency Department (HOSPITAL_COMMUNITY): Payer: Medicare Other

## 2012-08-15 ENCOUNTER — Encounter (HOSPITAL_COMMUNITY): Payer: Self-pay | Admitting: Internal Medicine

## 2012-08-15 ENCOUNTER — Inpatient Hospital Stay (HOSPITAL_COMMUNITY)
Admission: EM | Admit: 2012-08-15 | Discharge: 2012-08-17 | DRG: 202 | Disposition: A | Discharge status: Home / Self Care | Payer: Medicare Other | Attending: Internal Medicine | Admitting: Internal Medicine

## 2012-08-15 DIAGNOSIS — I10 Essential (primary) hypertension: Secondary | ICD-10-CM | POA: Diagnosis present

## 2012-08-15 DIAGNOSIS — Z7982 Long term (current) use of aspirin: Secondary | ICD-10-CM

## 2012-08-15 DIAGNOSIS — J40 Bronchitis, not specified as acute or chronic: Secondary | ICD-10-CM | POA: Diagnosis present

## 2012-08-15 DIAGNOSIS — D696 Thrombocytopenia, unspecified: Secondary | ICD-10-CM | POA: Diagnosis present

## 2012-08-15 DIAGNOSIS — R059 Cough, unspecified: Secondary | ICD-10-CM | POA: Diagnosis not present

## 2012-08-15 DIAGNOSIS — F419 Anxiety disorder, unspecified: Secondary | ICD-10-CM | POA: Diagnosis present

## 2012-08-15 DIAGNOSIS — I2510 Atherosclerotic heart disease of native coronary artery without angina pectoris: Secondary | ICD-10-CM | POA: Diagnosis not present

## 2012-08-15 DIAGNOSIS — R05 Cough: Secondary | ICD-10-CM | POA: Diagnosis not present

## 2012-08-15 DIAGNOSIS — N179 Acute kidney failure, unspecified: Secondary | ICD-10-CM | POA: Diagnosis not present

## 2012-08-15 DIAGNOSIS — F411 Generalized anxiety disorder: Secondary | ICD-10-CM | POA: Diagnosis present

## 2012-08-15 DIAGNOSIS — D509 Iron deficiency anemia, unspecified: Secondary | ICD-10-CM | POA: Diagnosis present

## 2012-08-15 DIAGNOSIS — E860 Dehydration: Secondary | ICD-10-CM | POA: Diagnosis not present

## 2012-08-15 DIAGNOSIS — F329 Major depressive disorder, single episode, unspecified: Secondary | ICD-10-CM | POA: Diagnosis present

## 2012-08-15 DIAGNOSIS — I951 Orthostatic hypotension: Secondary | ICD-10-CM | POA: Diagnosis present

## 2012-08-15 DIAGNOSIS — K449 Diaphragmatic hernia without obstruction or gangrene: Secondary | ICD-10-CM | POA: Diagnosis present

## 2012-08-15 DIAGNOSIS — K219 Gastro-esophageal reflux disease without esophagitis: Secondary | ICD-10-CM | POA: Diagnosis present

## 2012-08-15 DIAGNOSIS — R55 Syncope and collapse: Secondary | ICD-10-CM

## 2012-08-15 DIAGNOSIS — E785 Hyperlipidemia, unspecified: Secondary | ICD-10-CM | POA: Diagnosis present

## 2012-08-15 DIAGNOSIS — T465X5A Adverse effect of other antihypertensive drugs, initial encounter: Secondary | ICD-10-CM | POA: Diagnosis present

## 2012-08-15 DIAGNOSIS — R404 Transient alteration of awareness: Secondary | ICD-10-CM | POA: Diagnosis not present

## 2012-08-15 DIAGNOSIS — Z79899 Other long term (current) drug therapy: Secondary | ICD-10-CM | POA: Diagnosis not present

## 2012-08-15 DIAGNOSIS — F3289 Other specified depressive episodes: Secondary | ICD-10-CM | POA: Diagnosis present

## 2012-08-15 DIAGNOSIS — I2583 Coronary atherosclerosis due to lipid rich plaque: Secondary | ICD-10-CM | POA: Diagnosis present

## 2012-08-15 LAB — CBC WITH DIFFERENTIAL/PLATELET
Eosinophils Absolute: 0 10*3/uL (ref 0.0–0.7)
Eosinophils Relative: 1 % (ref 0–5)
HCT: 35.3 % — ABNORMAL LOW (ref 39.0–52.0)
Hemoglobin: 11.9 g/dL — ABNORMAL LOW (ref 13.0–17.0)
Lymphs Abs: 0.7 10*3/uL (ref 0.7–4.0)
MCH: 29.9 pg (ref 26.0–34.0)
MCV: 88.7 fL (ref 78.0–100.0)
Monocytes Absolute: 0.3 10*3/uL (ref 0.1–1.0)
Monocytes Relative: 8 % (ref 3–12)
RBC: 3.98 MIL/uL — ABNORMAL LOW (ref 4.22–5.81)

## 2012-08-15 LAB — POCT I-STAT, CHEM 8
BUN: 37 mg/dL — ABNORMAL HIGH (ref 6–23)
Creatinine, Ser: 2.2 mg/dL — ABNORMAL HIGH (ref 0.50–1.35)
Glucose, Bld: 155 mg/dL — ABNORMAL HIGH (ref 70–99)
Potassium: 4.5 mEq/L (ref 3.5–5.1)
Sodium: 142 mEq/L (ref 135–145)
TCO2: 20 mmol/L (ref 0–100)

## 2012-08-15 LAB — URINALYSIS, ROUTINE W REFLEX MICROSCOPIC
Glucose, UA: NEGATIVE mg/dL
Hgb urine dipstick: NEGATIVE
Protein, ur: 30 mg/dL — AB

## 2012-08-15 LAB — URINE MICROSCOPIC-ADD ON

## 2012-08-15 LAB — POCT I-STAT TROPONIN I

## 2012-08-15 MED ORDER — SODIUM CHLORIDE 0.9 % IJ SOLN: 3.0000 mL | Freq: Two times a day (BID) | INTRAMUSCULAR | Status: DC

## 2012-08-15 MED ORDER — SODIUM CHLORIDE 0.9 % IV BOLUS (SEPSIS)
500.0000 mL | INTRAVENOUS | Status: AC
  Administered 2012-08-15: 500 mL via INTRAVENOUS

## 2012-08-15 MED ORDER — PANTOPRAZOLE SODIUM 40 MG PO PACK
80.0000 mg | PACK | Freq: Every day | ORAL | Status: DC
  Administered 2012-08-16 – 2012-08-17 (×2): 80 mg via ORAL
  Filled 2012-08-15 (×4): qty 40

## 2012-08-15 MED ORDER — ACETAMINOPHEN 650 MG RE SUPP: 650.0000 mg | Freq: Four times a day (QID) | RECTAL | Status: DC | PRN

## 2012-08-15 MED ORDER — GUAIFENESIN-DM 100-10 MG/5ML PO SYRP
5.0000 mL | ORAL_SOLUTION | ORAL | Status: DC | PRN
  Administered 2012-08-15: 5 mL via ORAL
  Filled 2012-08-15: qty 5

## 2012-08-15 MED ORDER — BUPROPION HCL ER (XL) 150 MG PO TB24
150.0000 mg | ORAL_TABLET | Freq: Every day | ORAL | Status: DC
  Administered 2012-08-16 – 2012-08-17 (×2): 150 mg via ORAL
  Filled 2012-08-15 (×2): qty 1

## 2012-08-15 MED ORDER — ONDANSETRON HCL 4 MG/2ML IJ SOLN
4.0000 mg | Freq: Once | INTRAMUSCULAR | Status: AC
  Administered 2012-08-15: 4 mg via INTRAVENOUS
  Filled 2012-08-15: qty 2

## 2012-08-15 MED ORDER — ESOMEPRAZOLE MAGNESIUM 40 MG PO PACK: 40.0000 mg | PACK | Freq: Every day | ORAL | Status: DC

## 2012-08-15 MED ORDER — SODIUM CHLORIDE 0.9 % IV SOLN
INTRAVENOUS | Status: DC
  Administered 2012-08-15 – 2012-08-17 (×3): via INTRAVENOUS

## 2012-08-15 MED ORDER — ACETAMINOPHEN 325 MG PO TABS: 650.0000 mg | ORAL_TABLET | Freq: Four times a day (QID) | ORAL | Status: DC | PRN

## 2012-08-15 MED ORDER — ONDANSETRON HCL 4 MG/2ML IJ SOLN: 4.0000 mg | Freq: Three times a day (TID) | INTRAMUSCULAR | Status: AC | PRN

## 2012-08-15 MED ORDER — ENOXAPARIN SODIUM 40 MG/0.4ML ~~LOC~~ SOLN
40.0000 mg | Freq: Every day | SUBCUTANEOUS | Status: DC
  Administered 2012-08-15 – 2012-08-16 (×2): 40 mg via SUBCUTANEOUS
  Filled 2012-08-15 (×3): qty 0.4

## 2012-08-15 MED ORDER — ACETAMINOPHEN 500 MG PO TABS
1000.0000 mg | ORAL_TABLET | Freq: Once | ORAL | Status: AC
  Administered 2012-08-15: 1000 mg via ORAL
  Filled 2012-08-15: qty 2

## 2012-08-15 MED ORDER — VENLAFAXINE HCL ER 75 MG PO CP24
75.0000 mg | ORAL_CAPSULE | Freq: Every day | ORAL | Status: DC
  Administered 2012-08-16 – 2012-08-17 (×2): 75 mg via ORAL
  Filled 2012-08-15 (×3): qty 1

## 2012-08-15 MED ORDER — ALBUTEROL SULFATE HFA 108 (90 BASE) MCG/ACT IN AERS
2.0000 | INHALATION_SPRAY | RESPIRATORY_TRACT | Status: DC | PRN
  Filled 2012-08-15: qty 6.7

## 2012-08-15 NOTE — ED Notes (Signed)
Patient was at home, states that he got out of bed and became dizzy.  States that he was not able to make it back to the bed and slid to the floor.  Patient denies any trauma.

## 2012-08-15 NOTE — ED Notes (Signed)
Patient Bathed and put in a hospital gown. Clothing sent home with his wife as well as his wallet and keys.  His glasses remain at the bedside

## 2012-08-15 NOTE — ED Notes (Signed)
Called 2000 unit to give report. Receiving RN unable to take report at this time.

## 2012-08-15 NOTE — ED Provider Notes (Signed)
History     CSN: [id number]  Arrival date & time 08/15/12  1023   First MD Initiated Contact with Patient 08/15/12 1024      Chief Complaint  Patient presents with  . Near Syncope    (Consider location/radiation/quality/duration/timing/severity/associated sxs/prior treatment) HPI Comments: Scott [name] with a history of coronary artery bypass grafting in 1983, acid reflux, hypertension, hyperlipidemiaas well as a history of vasovagal ectopy which has been recurrent for him per his report presents with recurrent syncopal episode. The patient reports several days of feeling "bad". He states that he has had a cough and upper respiratory symptoms for which he has been seen by his family doctor, Dr. Kevan Ny yesterday and prescribed an antitussive as well as erythromycin. He was told that this might give him some diarrhea. This morning the patient and the paramedics report that he was trying to get to the bathroom after having some breakfast, he has not had much to eat in the last several days and felt like he had to have a bowel movement, he became lightheaded and had a syncopal episode which according to the paramedics the patient was lowered to the ground gradually by his spouse and did not fall and strike his head. The patient does not recall this, when he awakened, the paramedics were able to check his blood pressure he was found to be hypotensive to 80 systolic standing, 90 systolic laying down but not having any complaints including no chest pain, no shortness of breath, no nausea and he denies diarrhea at this point. That being said he did have evidence of diarrhea on their arrival. The syncopal event was acute in onset, has resolved spontaneously and has not been associated with fevers or chills.  The history is provided by the patient, the EMS personnel and medical records.    Past Medical History  Diagnosis Date  . Hypertension   . Anxiety   . Depression   . GERD (gastroesophageal  reflux disease)   . Hyperlipidemia   . CAD (coronary artery disease)     with CABG in 1983  . Iron deficiency anemia   . Erectile dysfunction   . Vasovagal syncope 05/2006  . Hx of adenomatous colonic polyps 2007  . Sigmoid diverticulosis   . Dyspnea 2010    syndrome - extensive  . Weight loss   . Urethral stricture 11/2008  . Shingles 1990's  . Inguinal hernia     No past surgical history on file.  No family history on file.  History  Substance Use Topics  . Smoking status: Never Smoker   . Smokeless tobacco: Never Used  . Alcohol Use: Yes     Comment: wine - daily      Review of Systems  All other systems reviewed and are negative.    Allergies  Review of patient's allergies indicates no known allergies.  Home Medications   Current Outpatient Rx  Name  Route  Sig  Dispense  Refill  . aspirin 81 MG chewable tablet   Oral   Chew 81 mg by mouth 2 (two) times a week.         [name] buPROPion (WELLBUTRIN XL) 150 MG 24 hr tablet   Oral   Take 150 mg by mouth daily.         [name] desvenlafaxine (PRISTIQ) 50 MG 24 hr tablet   Oral   Take 50 mg by mouth daily.         [name] esomeprazole (NEXIUM) 40 MG  packet   Oral   Take 40 mg by mouth daily before breakfast.         . ferrous sulfate (KP FERROUS SULFATE) 325 (65 FE) MG tablet   Oral   Take 325 mg by mouth daily.         . isosorbide mononitrate (IMDUR) 30 MG 24 hr tablet   Oral   Take 30 mg by mouth daily.         [name] lisinopril (PRINIVIL,ZESTRIL) 10 MG tablet   Oral   Take 10 mg by mouth daily.         . metoprolol succinate (TOPROL-XL) 25 MG 24 hr tablet   Oral   Take 25 mg by mouth daily.         . rosuvastatin (CRESTOR) 10 MG tablet   Oral   Take 10 mg by mouth daily.         . vitamin B-12 (CYANOCOBALAMIN) 1000 MCG tablet   Oral   Take 1,000 mcg by mouth daily.           BP 108/50  Pulse Scott  Temp(Src) 97.5 F (36.4 C) (Oral)  Resp 18  SpO2 100%  Physical Exam  Nursing  note and vitals reviewed. Constitutional: He appears well-developed and well-nourished. No distress.  HENT:  Head: Normocephalic and atraumatic.  Mouth/Throat: Oropharynx is clear and moist. No oropharyngeal exudate.  No signs of head trauma, mucous membranes are moist  Eyes: Conjunctivae and EOM are normal. Pupils are equal, round, and reactive to light. Right eye exhibits no discharge. Left eye exhibits no discharge. No scleral icterus.  Neck: Normal range of motion. Neck supple. No JVD present. No thyromegaly present.  Cardiovascular: Normal rate, regular rhythm and intact distal pulses.  Exam reveals no gallop and no friction rub.   Murmur ( Soft systolic) heard. Pulmonary/Chest: Effort normal and breath sounds normal. No respiratory distress. He has no wheezes. He has no rales.  Abdominal: Soft. Bowel sounds are normal. He exhibits no distension and no mass. There is no tenderness.  Musculoskeletal: Normal range of motion. He exhibits no edema and no tenderness.  Lymphadenopathy:    He has no cervical adenopathy.  Neurological: He is alert. Coordination normal.  Awake, alert, aware of his location, the date and his name. Moves all extremities to command, normal strength in all 4 extremities, normal sensation in all 4 extremities, cranial nerves III through XII are intact, coordination is intact, no limb ataxia  Skin: Skin is warm and dry. No rash noted. No erythema.  Psychiatric: He has a normal mood and affect. His behavior is normal.    ED Course  Procedures (including critical care time)  Labs Reviewed  CBC WITH DIFFERENTIAL - Abnormal; Notable for the following:    RBC 3.98 (*)    Hemoglobin 11.9 (*)    HCT 35.3 (*)    Platelets 101 (*)    All other components within normal limits  POCT I-STAT, CHEM 8 - Abnormal; Notable for the following:    BUN 37 (*)    Creatinine, Ser 2.20 (*)    Glucose, Bld 155 (*)    Hemoglobin 12.2 (*)    HCT 36.0 (*)    All other components  within normal limits  URINALYSIS, ROUTINE W REFLEX MICROSCOPIC  POCT I-STAT TROPONIN I   Dg Chest Port 1 View  08/15/2012  *RADIOLOGY REPORT*  Clinical Data: Cough  PORTABLE CHEST - 1 VIEW  Comparison: 02/18/2008, 07/01/2008  Findings: Large  hiatal hernia again evident.  Prior coronary bypass changes.  Stable heart size and vascularity.  Lungs remain clear. No pneumonia, edema, effusion or pneumothorax.  Trachea is midline.  IMPRESSION: No acute chest process.  Previous coronary bypass  Large hiatal hernia   Original Report Authenticated By: Judie Petit. Shick, M.D.      1. Syncope   2. ARF (acute renal failure)       MDM  The patient is persistently hypotensive, with last blood pressure was measured at 90 systolic on arrival, and shortly thereafter improved to 121/84 with a pulse of 73. Will check an EKG, labs and a chest x-ray due to his recent cough. Cardiac monitoring.  ED ECG REPORT  I personally interpreted this EKG   Date: 08/15/2012   Rate: 64  Rhythm: normal sinus rhythm  QRS Axis: normal  Intervals: normal  ST/T Wave abnormalities: normal  Conduction Disutrbances:none  Narrative Interpretation:   Old EKG Reviewed: none available   Labs show no leukocytosis, only very mild anemia but has acute renal failure with a creatinine of 2.2 and a BUN of 37 consistent with a dehydrated state.  CXR without acute findings  Patient given IV fluids, on testing of orthostatics  Discussed with admitting physicians, Dr. Lavera Guise who agrees with admission to the hospital, IV fluids. Holding orders requested and written.    Vida Roller, MD 08/15/12 [phone]

## 2012-08-15 NOTE — H&P (Signed)
[facility] History and Physical  Scott [name] JYN:[id number] DOB: [birth year] DOA: 08/15/2012  Referring physician: ED PCP: Scott Dubonnet, MD  Specialists: none  Chief Complaint:  Chief Complaint  Patient presents with  . Near Syncope     HPI: Scott [name] is a 79 y.o. male with history of HTN, CAD who was brought by ambulance from home after an episode of near syncope in the bathroom. EMS noted that the patient was hypotensive. Patient had been feeling unwell with cough, malaise for the past 5 days and went to Dr. Kevan [name] on Friday and received azithromycin and Mucinex He was unable to eat had an episode of diarrhea and continue to take all his antihypertensive medications. This morning when he got up to go to the bathroom he almost passed out.    Review of Systems: The patient denies fever, weight loss,, vision loss, decreased hearing, hoarseness, chest pain, dyspnea on exertion, peripheral edema, balance deficits, hemoptysis, abdominal pain, melena, hematochezia, severe indigestion/heartburn, hematuria, incontinence, genital sores, muscle weakness, suspicious skin lesions, transient blindness, difficulty walking, depression, unusual weight change, abnormal bleeding, enlarged lymph nodes.  Past Medical History  Diagnosis Date  . Hypertension   . Anxiety   . Depression   . GERD (gastroesophageal reflux disease)   . Hyperlipidemia   . CAD (coronary artery disease)     with CABG in 1983  . Iron deficiency anemia   . Erectile dysfunction   . Vasovagal syncope 05/2006  . Hx of adenomatous colonic polyps 2007  . Sigmoid diverticulosis   . Dyspnea 2010    syndrome - extensive  . Weight loss   . Urethral stricture 11/2008  . Shingles 1990's  . Inguinal hernia    No past surgical history on file. Social History:  reports that he has never smoked. He has never used smokeless tobacco. He reports that he drinks about 3.5 ounces of alcohol per week. He reports that he  does not use illicit drugs. Lives at home with wife   No Known Allergies  Family History  Problem Relation Age of Onset  . Heart failure Brother     Prior to Admission medications   Medication Sig Start Date End Date Taking? Authorizing Provider  aspirin 81 MG chewable tablet Chew 81 mg by mouth 2 (two) times a week.   Yes Historical Provider, MD  buPROPion (WELLBUTRIN XL) 150 MG 24 hr tablet Take 150 mg by mouth daily.   Yes Historical Provider, MD  desvenlafaxine (PRISTIQ) 50 MG 24 hr tablet Take 50 mg by mouth daily.   Yes Historical Provider, MD  esomeprazole (NEXIUM) 40 MG packet Take 40 mg by mouth daily before breakfast.   Yes Historical Provider, MD  ferrous sulfate (KP FERROUS SULFATE) 325 (65 FE) MG tablet Take 325 mg by mouth daily.   Yes Historical Provider, MD  isosorbide mononitrate (IMDUR) 30 MG 24 hr tablet Take 30 mg by mouth daily.   Yes Historical Provider, MD  lisinopril (PRINIVIL,ZESTRIL) 10 MG tablet Take 10 mg by mouth daily.   Yes Historical Provider, MD  metoprolol succinate (TOPROL-XL) 25 MG 24 hr tablet Take 25 mg by mouth daily.   Yes Historical Provider, MD  rosuvastatin (CRESTOR) 10 MG tablet Take 10 mg by mouth daily.   Yes Historical Provider, MD  vitamin B-12 (CYANOCOBALAMIN) 1000 MCG tablet Take 1,000 mcg by mouth daily.   Yes Historical Provider, MD   Physical Exam: Filed Vitals:   08/15/12 1136 08/15/12 1138 08/15/12 1140  08/15/12 1142  BP: 107/54 117/49 107/56 108/50  Pulse: 67 63 74 77  Temp:      TempSrc:      Resp: 18     SpO2: 100%        General:  axox3  Eyes: perrla, eomi   ENT: clear pharynx,   Neck: no JVD  Cardiovascular: rrr, no m,r,g   Respiratory: ctab no w,r,c   Abdomen: soft, nt, bs present  Skin: no rashes   Musculoskeletal: intact muscle bulk and tone   Psychiatric: euthymic  Neurologic: cn 2-12 intact, strength 5/5 all 4  Labs on Admission:  Basic Metabolic Panel:  Recent Labs Lab 08/15/12 1050  NA  142  K 4.5  CL 111  GLUCOSE 155*  BUN 37*  CREATININE 2.20*   Liver Function Tests: No results found for this basename: AST, ALT, ALKPHOS, BILITOT, PROT, ALBUMIN,  in the last 168 hours No results found for this basename: LIPASE, AMYLASE,  in the last 168 hours No results found for this basename: AMMONIA,  in the last 168 hours CBC:  Recent Labs Lab 08/15/12 1034 08/15/12 1050  WBC 4.4  --   NEUTROABS 3.4  --   HGB 11.9* 12.2*  HCT 35.3* 36.0*  MCV 88.7  --   PLT 101*  --    Cardiac Enzymes: No results found for this basename: CKTOTAL, CKMB, CKMBINDEX, TROPONINI,  in the last 168 hours  BNP (last 3 results) No results found for this basename: PROBNP,  in the last 8760 hours CBG: No results found for this basename: GLUCAP,  in the last 168 hours  Radiological Exams on Admission: Dg Chest Port 1 View  08/15/2012  *RADIOLOGY REPORT*  Clinical Data: Cough  PORTABLE CHEST - 1 VIEW  Comparison: 02/18/2008, 07/01/2008  Findings: Large hiatal hernia again evident.  Prior coronary bypass changes.  Stable heart size and vascularity.  Lungs remain clear. No pneumonia, edema, effusion or pneumothorax.  Trachea is midline.  IMPRESSION: No acute chest process.  Previous coronary bypass  Large hiatal hernia   Original Report Authenticated By: Judie Petit. Miles Costain, M.D.     EKG: Independently reviewed. No ST changes , NSR   Assessment/Plan Principal Problem:   Syncope Active Problems:   CAD   HIATAL HERNIA   Orthostasis   AKI (acute kidney injury)   Dehydration   Thrombocytopenia   Anxiety   Depression   GERD (gastroesophageal reflux disease)   Hyperlipidemia   CAD (coronary artery disease)   1. Presyncope vs syncope - in the setting of orthostasis and volume depletion. Plan  For observation on telemetry, cardiac monitoring, repeat orthostatic vital sign sin the morning.  2. AKI - most likely from volume depletion and acei use. - urinalysis minimaly active sediment, just a few hyaline  casts and low protein, no RBCs or WBCs - probably related to dehydration. Plan for IV fluids and monitor Is/Os - would hold off on further tests (e.g renal US, ana, complement, etc) until tomorrow and only pursue them is creatinine does not improve over night  3. GERD - c/w PPI 4. Depression - would cont home meds    Code Status: dnr  Family Communication: wife  Disposition Plan: home     Layza Summa [facility] Pager [phone]  If 7PM-7AM, please contact night-coverage [email or web address] Password [facility] 08/15/2012, 12:29 PM

## 2012-08-16 DIAGNOSIS — K219 Gastro-esophageal reflux disease without esophagitis: Secondary | ICD-10-CM | POA: Diagnosis not present

## 2012-08-16 DIAGNOSIS — E860 Dehydration: Secondary | ICD-10-CM | POA: Diagnosis not present

## 2012-08-16 DIAGNOSIS — R55 Syncope and collapse: Secondary | ICD-10-CM | POA: Diagnosis not present

## 2012-08-16 DIAGNOSIS — I10 Essential (primary) hypertension: Secondary | ICD-10-CM | POA: Diagnosis not present

## 2012-08-16 LAB — BASIC METABOLIC PANEL
BUN: 30 mg/dL — ABNORMAL HIGH (ref 6–23)
Calcium: 8.2 mg/dL — ABNORMAL LOW (ref 8.4–10.5)
Creatinine, Ser: 2.08 mg/dL — ABNORMAL HIGH (ref 0.50–1.35)
GFR calc Af Amer: 32 mL/min — ABNORMAL LOW (ref >90)
GFR calc non Af Amer: 27 mL/min — ABNORMAL LOW (ref >90)
Glucose, Bld: 91 mg/dL (ref 70–99)
Potassium: 4.7 mEq/L (ref 3.5–5.1)

## 2012-08-16 LAB — CBC
Hemoglobin: 11.2 g/dL — ABNORMAL LOW (ref 13.0–17.0)
MCH: 29.8 pg (ref 26.0–34.0)
MCHC: 34 g/dL (ref 30.0–36.0)
Platelets: 97 10*3/uL — ABNORMAL LOW (ref 150–400)
RDW: 13.5 % (ref 11.5–15.5)

## 2012-08-16 NOTE — Progress Notes (Signed)
Orthostatic Vital Signs:  BP 153/72 HR 62 Lying BP 169/97 HR 72 Sitting BP 144/79 HR 74 Standing  Will continue to monitor

## 2012-08-16 NOTE — Progress Notes (Signed)
Assessment/Plan: Principal Problem:   Syncope - this seems most likely due to volume depletion due to illness and meds. Lisinopril and metoprolol on hold. Creatinine is a bit better. Troponins negative.  Active Problems:   CAD   HIATAL HERNIA   Orthostasis   AKI (acute kidney injury) - slightly improved. Only baseline in EPIC is from 2009.    Dehydration   Thrombocytopenia   Anxiety   Depression   GERD (gastroesophageal reflux disease)   Hyperlipidemia   CAD (coronary artery disease)   Subjective: Feels a bit better. Thought he would be feeling even better and so is a bit disappointed that he does not.   Objective:  Vital Signs: Filed Vitals:   08/16/12 0402 08/16/12 0410 08/16/12 0416 08/16/12 0553  BP: 153/72 169/97 144/79   Pulse: 62 72 74   Temp: 98.9 F (37.2 C)     TempSrc: Oral     Resp: 18 18 18    Height:    5\' 6"  (1.676 m)  SpO2: 96% 96% 95%      EXAM: Alert, oriented  Skin turgor seems fine.    Intake/Output Summary (Last 24 hours) at 08/16/12 0908 Last data filed at 08/16/12 1610  Gross per 24 hour  Intake    900 ml  Output   1075 ml  Net   -175 ml    Lab Results:  Recent Labs  08/15/12 1050 08/16/12 0700  NA 142 138  K 4.5 4.7  CL 111 106  CO2  --  20  GLUCOSE 155* 91  BUN 37* 30*  CREATININE 2.20* 2.08*  CALCIUM  --  8.2*   No results found for this basename: AST, ALT, ALKPHOS, BILITOT, PROT, ALBUMIN,  in the last 72 hours No results found for this basename: LIPASE, AMYLASE,  in the last 72 hours  Recent Labs  08/15/12 1034 08/15/12 1050 08/16/12 0700  WBC 4.4  --  3.4*  NEUTROABS 3.4  --   --   HGB 11.9* 12.2* 11.2*  HCT 35.3* 36.0* 32.9*  MCV 88.7  --  87.5  PLT 101*  --  97*    Recent Labs  08/15/12 1540  TROPONINI <0.30   No components found with this basename: POCBNP,  No results found for this basename: DDIMER,  in the last 72 hours No results found for this basename: HGBA1C,  in the last 72 hours No results  found for this basename: CHOL, HDL, LDLCALC, TRIG, CHOLHDL, LDLDIRECT,  in the last 72 hours No results found for this basename: TSH, T4TOTAL, FREET3, T3FREE, THYROIDAB,  in the last 72 hours No results found for this basename: VITAMINB12, FOLATE, FERRITIN, TIBC, IRON, RETICCTPCT,  in the last 72 hours  Studies/Results: Dg Chest Port 1 View  08/15/2012  *RADIOLOGY REPORT*  Clinical Data: Cough  PORTABLE CHEST - 1 VIEW  Comparison: 02/18/2008, 07/01/2008  Findings: Large hiatal hernia again evident.  Prior coronary bypass changes.  Stable heart size and vascularity.  Lungs remain clear. No pneumonia, edema, effusion or pneumothorax.  Trachea is midline.  IMPRESSION: No acute chest process.  Previous coronary bypass  Large hiatal hernia   Original Report Authenticated By: Judie Petit. Miles Costain, M.D.    Medications: Medications administered in the last 24 hours reviewed.  Current Medication List reviewed.    LOS: 1 day   [facility] @ [facility] [phone]) 08/16/2012, 9:08 AM

## 2012-08-16 NOTE — Progress Notes (Signed)
Utilization review completed.  

## 2012-08-17 DIAGNOSIS — R55 Syncope and collapse: Secondary | ICD-10-CM | POA: Diagnosis not present

## 2012-08-17 DIAGNOSIS — E860 Dehydration: Secondary | ICD-10-CM | POA: Diagnosis not present

## 2012-08-17 DIAGNOSIS — I2510 Atherosclerotic heart disease of native coronary artery without angina pectoris: Secondary | ICD-10-CM | POA: Diagnosis not present

## 2012-08-17 LAB — BASIC METABOLIC PANEL
BUN: 21 mg/dL (ref 6–23)
CO2: 21 mEq/L (ref 19–32)
Calcium: 8.5 mg/dL (ref 8.4–10.5)
Chloride: 108 mEq/L (ref 96–112)
Creatinine, Ser: 1.83 mg/dL — ABNORMAL HIGH (ref 0.50–1.35)
GFR calc Af Amer: 37 mL/min — ABNORMAL LOW (ref >90)
GFR calc non Af Amer: 32 mL/min — ABNORMAL LOW (ref >90)
Glucose, Bld: 93 mg/dL (ref 70–99)
Potassium: 4.5 mEq/L (ref 3.5–5.1)
Sodium: 140 mEq/L (ref 135–145)

## 2012-08-17 MED ORDER — LISINOPRIL 10 MG PO TABS: 5.0000 mg | ORAL_TABLET | Freq: Every day | ORAL | Status: DC

## 2012-08-17 MED ORDER — GUAIFENESIN-DM 100-10 MG/5ML PO SYRP: 5.0000 mL | ORAL_SOLUTION | ORAL | Status: DC | PRN

## 2012-08-17 NOTE — Progress Notes (Signed)
Pt awaiting wife to arrive for d/c home; will cont. To monitor.

## 2012-08-17 NOTE — Discharge Summary (Signed)
Physician Discharge Summary  NAME:Scott [name]  ZOX:[id number]  DOB: 18-Nov-1923   Admit date: 08/15/2012 Discharge date: 08/17/2012  Discharge Diagnoses:  Principal Problem:   Syncope - likely secondary to relative dehydration from respiratory infection and continuance of antihypertensive medications Active Problems:   CAD - stable   HIATAL HERNIA   Orthostasis - symptomatically resolved   AKI (acute kidney injury) - improved   Dehydration - resolved   Thrombocytopenia - stable   Anxiety - stable   Depression - stable   GERD (gastroesophageal reflux disease) - asymptomatic on therapy   Hyperlipidemia - aware   CAD (coronary artery disease) - no chest pain   Discharge Physical Exam:  General Appearance: Alert, cooperative, no distress, appears stated age  Weight change:   Intake/Output Summary (Last 24 hours) at 08/17/12 0810 Last data filed at 08/17/12 0800  Gross per 24 hour  Intake 2934.99 ml  Output   1550 ml  Net 1384.99 ml   Filed Vitals:   08/16/12 1406 08/16/12 2003 08/17/12 0413 08/17/12 0700  BP: 163/81 167/79 163/77   Pulse: 72 64 57   Temp: 98.2 F (36.8 C) 98.7 F (37.1 C) 98.2 F (36.8 C)   TempSrc: Oral Oral Oral   Resp: 16 17 18    Height:      Weight:    70.444 kg (155 lb 4.8 oz)  SpO2: 98% 98% 98%     Lungs: Clear to auscultation bilaterally, respirations unlabored Heart: Regular rate and rhythm, S1 and S2 normal, no murmur, rub or gallop Abdomen: Soft, non-tender, bowel sounds active all four quadrants, no masses, no organomegaly Extremities: Extremities normal, atraumatic, no cyanosis or edema Neuro:  Oriented to name and place, nonfocal  Discharge Condition: Much improved  Hospital Course:  Scott [name] is a very pleasant 79 year old male with history of hypertension and CAD and had near-syncope in his bathroom.  Patient was seen in the office a day or so before and was being treated with azithromycin Mucinex for bronchitis.  He  continued to take all his antihypertensive medications but also developed diarrhea and was felt on admission likely had near-syncope secondary to dehydration.  He has been rehydrated in the hospital with normal saline and feeling much better overall.  Cough has continued to clear.  He is ambulating well in the halls this morning we'll discharge home around midday.  Things to follow up in the outpatient setting: Monitor for fever or increasing respiratory symptoms such as chest congestion and shortness of breath.  We'll reduce lisinopril in half and reassess in the office later this week or early next week.  Consults:  Noncontributory  Disposition: 01-Home or Self Care  Discharge Orders   Future Orders Complete By Expires     Call MD for:  persistant dizziness or light-headedness  As directed     Call MD for:  temperature >100.4  As directed     Diet - low sodium heart healthy  As directed     Discharge instructions  As directed     Comments:      Call M.D. at [phone] if recurrent symptoms of lightheadedness dizziness or shortness of breath or fever or chills occurs or chest pain    Increase activity slowly  As directed         Medication List    TAKE these medications       aspirin 81 MG chewable tablet  Chew 81 mg by mouth 2 (two) times a week.  buPROPion 150 MG 24 hr tablet  Commonly known as:  WELLBUTRIN XL  Take 150 mg by mouth daily.     esomeprazole 40 MG packet  Commonly known as:  NEXIUM  Take 40 mg by mouth daily before breakfast.     guaiFENesin-dextromethorphan 100-10 MG/5ML syrup  Commonly known as:  ROBITUSSIN DM  Take 5 mLs by mouth every 4 (four) hours as needed for cough.     isosorbide mononitrate 30 MG 24 hr tablet  Commonly known as:  IMDUR  Take 30 mg by mouth daily.     KP FERROUS SULFATE 325 (65 FE) MG tablet  Generic drug:  ferrous sulfate  Take 325 mg by mouth daily.     lisinopril 10 MG tablet  Commonly known as:  PRINIVIL,ZESTRIL  Take 0.5  tablets (5 mg total) by mouth daily.     metoprolol succinate 25 MG 24 hr tablet  Commonly known as:  TOPROL-XL  Take 25 mg by mouth daily.     PRISTIQ 50 MG 24 hr tablet  Generic drug:  desvenlafaxine  Take 50 mg by mouth daily.     rosuvastatin 10 MG tablet  Commonly known as:  CRESTOR  Take 10 mg by mouth daily.     vitamin B-12 1000 MCG tablet  Commonly known as:  CYANOCOBALAMIN  Take 1,000 mcg by mouth daily.         The results of significant diagnostics from this hospitalization (including imaging, microbiology, ancillary and laboratory) are listed below for reference.    Significant Diagnostic Studies: Dg Chest Port 1 View  08/15/2012  *RADIOLOGY REPORT*  Clinical Data: Cough  PORTABLE CHEST - 1 VIEW  Comparison: 02/18/2008, 07/01/2008  Findings: Large hiatal hernia again evident.  Prior coronary bypass changes.  Stable heart size and vascularity.  Lungs remain clear. No pneumonia, edema, effusion or pneumothorax.  Trachea is midline.  IMPRESSION: No acute chest process.  Previous coronary bypass  Large hiatal hernia   Original Report Authenticated By: Judie Petit. Miles Costain, M.D.     Microbiology: No results found for this or any previous visit (from the past 240 hour(s)).   Labs: Results for orders placed during the hospital encounter of 08/15/12  CBC WITH DIFFERENTIAL      Result Value Range   WBC 4.4  4.0 - 10.5 K/uL   RBC 3.98 (*) 4.22 - 5.81 MIL/uL   Hemoglobin 11.9 (*) 13.0 - 17.0 g/dL   HCT 16.1 (*) 09.6 - 04.5 %   MCV 88.7  78.0 - 100.0 fL   MCH 29.9  26.0 - 34.0 pg   MCHC 33.7  30.0 - 36.0 g/dL   RDW 40.9  81.1 - 91.4 %   Platelets 101 (*) 150 - 400 K/uL   Neutrophils Relative 76  43 - 77 %   Neutro Abs 3.4  1.7 - 7.7 K/uL   Lymphocytes Relative 15  12 - 46 %   Lymphs Abs 0.7  0.7 - 4.0 K/uL   Monocytes Relative 8  3 - 12 %   Monocytes Absolute 0.3  0.1 - 1.0 K/uL   Eosinophils Relative 1  0 - 5 %   Eosinophils Absolute 0.0  0.0 - 0.7 K/uL   Basophils Relative  0  0 - 1 %   Basophils Absolute 0.0  0.0 - 0.1 K/uL  URINALYSIS, ROUTINE W REFLEX MICROSCOPIC      Result Value Range   Color, Urine YELLOW  YELLOW   APPearance CLEAR  CLEAR  Specific Gravity, Urine 1.024  1.005 - 1.030   pH 5.0  5.0 - 8.0   Glucose, UA NEGATIVE  NEGATIVE mg/dL   Hgb urine dipstick NEGATIVE  NEGATIVE   Bilirubin Urine SMALL (*) NEGATIVE   Ketones, ur 15 (*) NEGATIVE mg/dL   Protein, ur 30 (*) NEGATIVE mg/dL   Urobilinogen, UA 0.2  0.0 - 1.0 mg/dL   Nitrite NEGATIVE  NEGATIVE   Leukocytes, UA NEGATIVE  NEGATIVE  URINE MICROSCOPIC-ADD ON      Result Value Range   Squamous Epithelial / LPF RARE  RARE   WBC, UA 0-2  <3 WBC/hpf   Casts HYALINE CASTS (*) NEGATIVE  TROPONIN I      Result Value Range   Troponin I <0.30  <0.30 ng/mL  BASIC METABOLIC PANEL      Result Value Range   Sodium 138  135 - 145 mEq/L   Potassium 4.7  3.5 - 5.1 mEq/L   Chloride 106  96 - 112 mEq/L   CO2 20  19 - 32 mEq/L   Glucose, Bld 91  70 - 99 mg/dL   BUN 30 (*) 6 - 23 mg/dL   Creatinine, Ser 1.61 (*) 0.50 - 1.35 mg/dL   Calcium 8.2 (*) 8.4 - 10.5 mg/dL   GFR calc non Af Amer 27 (*) >90 mL/min   GFR calc Af Amer 32 (*) >90 mL/min  CBC      Result Value Range   WBC 3.4 (*) 4.0 - 10.5 K/uL   RBC 3.76 (*) 4.22 - 5.81 MIL/uL   Hemoglobin 11.2 (*) 13.0 - 17.0 g/dL   HCT 09.6 (*) 04.5 - 40.9 %   MCV 87.5  78.0 - 100.0 fL   MCH 29.8  26.0 - 34.0 pg   MCHC 34.0  30.0 - 36.0 g/dL   RDW 81.1  91.4 - 78.2 %   Platelets 97 (*) 150 - 400 K/uL  BASIC METABOLIC PANEL      Result Value Range   Sodium 140  135 - 145 mEq/L   Potassium 4.5  3.5 - 5.1 mEq/L   Chloride 108  96 - 112 mEq/L   CO2 21  19 - 32 mEq/L   Glucose, Bld 93  70 - 99 mg/dL   BUN 21  6 - 23 mg/dL   Creatinine, Ser 9.56 (*) 0.50 - 1.35 mg/dL   Calcium 8.5  8.4 - 21.3 mg/dL   GFR calc non Af Amer 32 (*) >90 mL/min   GFR calc Af Amer 37 (*) >90 mL/min  POCT I-STAT, CHEM 8      Result Value Range   Sodium 142  135 - 145  mEq/L   Potassium 4.5  3.5 - 5.1 mEq/L   Chloride 111  96 - 112 mEq/L   BUN 37 (*) 6 - 23 mg/dL   Creatinine, Ser 0.86 (*) 0.50 - 1.35 mg/dL   Glucose, Bld 578 (*) 70 - 99 mg/dL   Calcium, Ion 4.69  6.29 - 1.30 mmol/L   TCO2 20  0 - 100 mmol/L   Hemoglobin 12.2 (*) 13.0 - 17.0 g/dL   HCT 52.8 (*) 41.3 - 24.4 %  POCT I-STAT TROPONIN I      Result Value Range   Troponin i, poc 0.00  0.00 - 0.08 ng/mL   Comment 3             Time coordinating discharge: 35 minutes  Signed: Pearla Dubonnet, MD 08/17/2012, 8:10 AM

## 2012-08-17 NOTE — Progress Notes (Signed)
Tele monitor d/c at this time; pt and wife given d/c instructions; both verbalized understanding; pt to d/c home with wife.

## 2012-08-17 NOTE — Progress Notes (Signed)
Pt ambulated 350 feet at this time; pt tolerated ambulation well; no needs voiced; pt back to room to chair; pt to be d/c home with wife; will await wife's arrival to review d/c instructions; will cont. To monitor.

## 2012-08-24 DIAGNOSIS — J40 Bronchitis, not specified as acute or chronic: Secondary | ICD-10-CM | POA: Diagnosis not present

## 2012-09-07 DIAGNOSIS — I2510 Atherosclerotic heart disease of native coronary artery without angina pectoris: Secondary | ICD-10-CM | POA: Diagnosis not present

## 2012-09-07 DIAGNOSIS — J40 Bronchitis, not specified as acute or chronic: Secondary | ICD-10-CM | POA: Diagnosis not present

## 2012-11-12 DIAGNOSIS — I10 Essential (primary) hypertension: Secondary | ICD-10-CM | POA: Diagnosis not present

## 2012-11-12 DIAGNOSIS — E7800 Pure hypercholesterolemia, unspecified: Secondary | ICD-10-CM | POA: Diagnosis not present

## 2012-11-12 DIAGNOSIS — I2510 Atherosclerotic heart disease of native coronary artery without angina pectoris: Secondary | ICD-10-CM | POA: Diagnosis not present

## 2012-12-07 DIAGNOSIS — E7800 Pure hypercholesterolemia, unspecified: Secondary | ICD-10-CM | POA: Diagnosis not present

## 2012-12-07 DIAGNOSIS — H9190 Unspecified hearing loss, unspecified ear: Secondary | ICD-10-CM | POA: Diagnosis not present

## 2012-12-07 DIAGNOSIS — Z79899 Other long term (current) drug therapy: Secondary | ICD-10-CM | POA: Diagnosis not present

## 2012-12-07 DIAGNOSIS — M79609 Pain in unspecified limb: Secondary | ICD-10-CM | POA: Diagnosis not present

## 2012-12-07 DIAGNOSIS — L570 Actinic keratosis: Secondary | ICD-10-CM | POA: Diagnosis not present

## 2012-12-07 DIAGNOSIS — I2510 Atherosclerotic heart disease of native coronary artery without angina pectoris: Secondary | ICD-10-CM | POA: Diagnosis not present

## 2012-12-07 DIAGNOSIS — F329 Major depressive disorder, single episode, unspecified: Secondary | ICD-10-CM | POA: Diagnosis not present

## 2012-12-07 DIAGNOSIS — E538 Deficiency of other specified B group vitamins: Secondary | ICD-10-CM | POA: Diagnosis not present

## 2012-12-07 DIAGNOSIS — D509 Iron deficiency anemia, unspecified: Secondary | ICD-10-CM | POA: Diagnosis not present

## 2013-03-15 DIAGNOSIS — H9190 Unspecified hearing loss, unspecified ear: Secondary | ICD-10-CM | POA: Diagnosis not present

## 2013-03-15 DIAGNOSIS — E7800 Pure hypercholesterolemia, unspecified: Secondary | ICD-10-CM | POA: Diagnosis not present

## 2013-03-15 DIAGNOSIS — L570 Actinic keratosis: Secondary | ICD-10-CM | POA: Diagnosis not present

## 2013-03-15 DIAGNOSIS — Z1331 Encounter for screening for depression: Secondary | ICD-10-CM | POA: Diagnosis not present

## 2013-03-15 DIAGNOSIS — Z79899 Other long term (current) drug therapy: Secondary | ICD-10-CM | POA: Diagnosis not present

## 2013-03-15 DIAGNOSIS — Z0000 Encounter for general adult medical examination without abnormal findings: Secondary | ICD-10-CM | POA: Diagnosis not present

## 2013-03-15 DIAGNOSIS — D509 Iron deficiency anemia, unspecified: Secondary | ICD-10-CM | POA: Diagnosis not present

## 2013-03-15 DIAGNOSIS — E538 Deficiency of other specified B group vitamins: Secondary | ICD-10-CM | POA: Diagnosis not present

## 2013-03-24 ENCOUNTER — Encounter (HOSPITAL_COMMUNITY): Payer: Self-pay | Admitting: Emergency Medicine

## 2013-03-24 ENCOUNTER — Inpatient Hospital Stay (HOSPITAL_COMMUNITY)
Admission: EM | Admit: 2013-03-24 | Discharge: 2013-03-26 | DRG: 378 | Disposition: A | Discharge status: Home / Self Care | Payer: Medicare Other | Attending: Internal Medicine | Admitting: Internal Medicine

## 2013-03-24 ENCOUNTER — Emergency Department (HOSPITAL_COMMUNITY): Payer: Medicare Other

## 2013-03-24 DIAGNOSIS — Z8249 Family history of ischemic heart disease and other diseases of the circulatory system: Secondary | ICD-10-CM

## 2013-03-24 DIAGNOSIS — F3289 Other specified depressive episodes: Secondary | ICD-10-CM | POA: Diagnosis not present

## 2013-03-24 DIAGNOSIS — D649 Anemia, unspecified: Secondary | ICD-10-CM | POA: Diagnosis not present

## 2013-03-24 DIAGNOSIS — I129 Hypertensive chronic kidney disease with stage 1 through stage 4 chronic kidney disease, or unspecified chronic kidney disease: Secondary | ICD-10-CM | POA: Diagnosis present

## 2013-03-24 DIAGNOSIS — K921 Melena: Secondary | ICD-10-CM

## 2013-03-24 DIAGNOSIS — E785 Hyperlipidemia, unspecified: Secondary | ICD-10-CM | POA: Diagnosis present

## 2013-03-24 DIAGNOSIS — K449 Diaphragmatic hernia without obstruction or gangrene: Secondary | ICD-10-CM | POA: Diagnosis present

## 2013-03-24 DIAGNOSIS — D509 Iron deficiency anemia, unspecified: Secondary | ICD-10-CM | POA: Diagnosis not present

## 2013-03-24 DIAGNOSIS — N183 Chronic kidney disease, stage 3 unspecified: Secondary | ICD-10-CM | POA: Diagnosis present

## 2013-03-24 DIAGNOSIS — R231 Pallor: Secondary | ICD-10-CM | POA: Diagnosis not present

## 2013-03-24 DIAGNOSIS — R5381 Other malaise: Secondary | ICD-10-CM | POA: Diagnosis not present

## 2013-03-24 DIAGNOSIS — K922 Gastrointestinal hemorrhage, unspecified: Principal | ICD-10-CM | POA: Diagnosis present

## 2013-03-24 DIAGNOSIS — I10 Essential (primary) hypertension: Secondary | ICD-10-CM | POA: Diagnosis present

## 2013-03-24 DIAGNOSIS — F329 Major depressive disorder, single episode, unspecified: Secondary | ICD-10-CM | POA: Diagnosis present

## 2013-03-24 DIAGNOSIS — Z7982 Long term (current) use of aspirin: Secondary | ICD-10-CM

## 2013-03-24 DIAGNOSIS — D500 Iron deficiency anemia secondary to blood loss (chronic): Secondary | ICD-10-CM | POA: Diagnosis present

## 2013-03-24 DIAGNOSIS — F32A Depression, unspecified: Secondary | ICD-10-CM | POA: Diagnosis present

## 2013-03-24 DIAGNOSIS — N189 Chronic kidney disease, unspecified: Secondary | ICD-10-CM | POA: Diagnosis not present

## 2013-03-24 DIAGNOSIS — K219 Gastro-esophageal reflux disease without esophagitis: Secondary | ICD-10-CM | POA: Diagnosis present

## 2013-03-24 DIAGNOSIS — I2510 Atherosclerotic heart disease of native coronary artery without angina pectoris: Secondary | ICD-10-CM | POA: Diagnosis present

## 2013-03-24 DIAGNOSIS — N184 Chronic kidney disease, stage 4 (severe): Secondary | ICD-10-CM | POA: Diagnosis present

## 2013-03-24 DIAGNOSIS — D62 Acute posthemorrhagic anemia: Secondary | ICD-10-CM | POA: Diagnosis present

## 2013-03-24 DIAGNOSIS — Z951 Presence of aortocoronary bypass graft: Secondary | ICD-10-CM | POA: Diagnosis not present

## 2013-03-24 DIAGNOSIS — K2960 Other gastritis without bleeding: Secondary | ICD-10-CM | POA: Diagnosis present

## 2013-03-24 HISTORY — DX: Personal history of other medical treatment: Z92.89

## 2013-03-24 HISTORY — DX: Peripheral vascular disease, unspecified: I73.9

## 2013-03-24 HISTORY — DX: Personal history of other diseases of the digestive system: Z87.19

## 2013-03-24 LAB — PREPARE RBC (CROSSMATCH)

## 2013-03-24 LAB — COMPREHENSIVE METABOLIC PANEL
ALT: 12 U/L (ref 0–53)
Albumin: 3.6 g/dL (ref 3.5–5.2)
Alkaline Phosphatase: 50 U/L (ref 39–117)
BUN: 46 mg/dL — ABNORMAL HIGH (ref 6–23)
Chloride: 107 mEq/L (ref 96–112)
GFR calc Af Amer: 35 mL/min — ABNORMAL LOW (ref >90)
Glucose, Bld: 107 mg/dL — ABNORMAL HIGH (ref 70–99)
Potassium: 4.6 mEq/L (ref 3.5–5.1)
Sodium: 138 mEq/L (ref 135–145)
Total Bilirubin: 0.1 mg/dL — ABNORMAL LOW (ref 0.3–1.2)
Total Protein: 6.4 g/dL (ref 6.0–8.3)

## 2013-03-24 LAB — CBC WITH DIFFERENTIAL/PLATELET
Eosinophils Absolute: 0.2 10*3/uL (ref 0.0–0.7)
Hemoglobin: 7 g/dL — ABNORMAL LOW (ref 13.0–17.0)
Lymphs Abs: 1.6 10*3/uL (ref 0.7–4.0)
MCH: 26.4 pg (ref 26.0–34.0)
Monocytes Relative: 8 % (ref 3–12)
Neutro Abs: 8.1 10*3/uL — ABNORMAL HIGH (ref 1.7–7.7)
Neutrophils Relative %: 75 % (ref 43–77)
Platelets: 239 10*3/uL (ref 150–400)
RBC: 2.65 MIL/uL — ABNORMAL LOW (ref 4.22–5.81)
WBC: 10.9 10*3/uL — ABNORMAL HIGH (ref 4.0–10.5)

## 2013-03-24 LAB — ABO/RH: ABO/RH(D): O POS

## 2013-03-24 MED ORDER — ACETAMINOPHEN 650 MG RE SUPP: 650.0000 mg | Freq: Four times a day (QID) | RECTAL | Status: DC | PRN

## 2013-03-24 MED ORDER — SODIUM CHLORIDE 0.9 % IJ SOLN
3.0000 mL | Freq: Two times a day (BID) | INTRAMUSCULAR | Status: DC
  Administered 2013-03-25 (×2): 3 mL via INTRAVENOUS

## 2013-03-24 MED ORDER — PANTOPRAZOLE SODIUM 40 MG IV SOLR
40.0000 mg | Freq: Once | INTRAVENOUS | Status: AC
  Administered 2013-03-24: 40 mg via INTRAVENOUS
  Filled 2013-03-24: qty 40

## 2013-03-24 MED ORDER — ACETAMINOPHEN 325 MG PO TABS: 650.0000 mg | ORAL_TABLET | Freq: Four times a day (QID) | ORAL | Status: DC | PRN

## 2013-03-24 MED ORDER — ONDANSETRON HCL 4 MG/2ML IJ SOLN: 4.0000 mg | Freq: Three times a day (TID) | INTRAMUSCULAR | Status: DC | PRN

## 2013-03-24 MED ORDER — PANTOPRAZOLE SODIUM 40 MG IV SOLR: 40.0000 mg | Freq: Two times a day (BID) | INTRAVENOUS | Status: DC

## 2013-03-24 MED ORDER — ONDANSETRON HCL 4 MG/2ML IJ SOLN: 4.0000 mg | Freq: Four times a day (QID) | INTRAMUSCULAR | Status: DC | PRN

## 2013-03-24 MED ORDER — SODIUM CHLORIDE 0.9 % IV SOLN
80.0000 mg | Freq: Once | INTRAVENOUS | Status: AC
  Administered 2013-03-25: 80 mg via INTRAVENOUS
  Filled 2013-03-24: qty 80

## 2013-03-24 MED ORDER — SODIUM CHLORIDE 0.9 % IV SOLN: INTRAVENOUS | Status: DC

## 2013-03-24 MED ORDER — SODIUM CHLORIDE 0.9 % IV SOLN
INTRAVENOUS | Status: AC
  Administered 2013-03-25: 03:00:00 via INTRAVENOUS

## 2013-03-24 MED ORDER — SODIUM CHLORIDE 0.9 % IV SOLN
Freq: Once | INTRAVENOUS | Status: AC
  Administered 2013-03-24: 19:00:00 via INTRAVENOUS

## 2013-03-24 MED ORDER — SODIUM CHLORIDE 0.9 % IV SOLN
8.0000 mg/h | INTRAVENOUS | Status: DC
  Administered 2013-03-25 – 2013-03-26 (×3): 8 mg/h via INTRAVENOUS
  Filled 2013-03-24 (×7): qty 80

## 2013-03-24 MED ORDER — ONDANSETRON HCL 4 MG PO TABS: 4.0000 mg | ORAL_TABLET | Freq: Four times a day (QID) | ORAL | Status: DC | PRN

## 2013-03-24 NOTE — ED Notes (Signed)
Pt c/o anemia and sent here for eval and possible transfusion; pt sts black stool but unsure if from iron; pt sts generalized weakness and appears pale

## 2013-03-24 NOTE — H&P (Signed)
[facility] History and Physical  Scott [name] ZOX:[id number] DOB: 07/12/1923 DOA: 03/24/2013  Referring physician: ER physician. PCP: Scott Dubonnet, MD  Chief Complaint: Low hemoglobin and black stools.  HPI: Scott [name] is a 79 y.o. male and history of CAD status post CABG, hypertension chronic kidney disease has been experiencing weakness over the last few days with black stools. Patient had gone to his PCPs office today where he had blood work done which showed low hemoglobin around 7 and was advised to come to the ER. Patient states he almost passed out today and had one episode of nausea and vomiting but denies any abdominal pain or diarrhea. He has been taking naproxen for last few days because of headache. Otherwise usually she does not take naproxen. He is on chronic aspirin therapy for CAD. Patient presently is hemodynamically stable and has been admitted for GI bleed. Patient denies any chest pain or shortness of breath any focal deficits.   Review of Systems: As presented in the history of presenting illness, rest negative.  Past Medical History  Diagnosis Date  . Hypertension   . Anxiety   . Depression   . GERD (gastroesophageal reflux disease)   . Hyperlipidemia   . CAD (coronary artery disease)     with CABG in 1983  . Iron deficiency anemia   . Erectile dysfunction   . Vasovagal syncope 05/2006  . Hx of adenomatous colonic polyps 2007  . Sigmoid diverticulosis   . Dyspnea 2010    syndrome - extensive  . Weight loss   . Urethral stricture 11/2008  . Shingles 1990's  . Inguinal hernia    Past Surgical History  Procedure Laterality Date  . Coronary artery bypass graft    . Cholecystectomy    . Hernia repair     Social History:  reports that he has never smoked. He has never used smokeless tobacco. He reports that he drinks about 3.5 ounces of alcohol per week. He reports that he does not use illicit drugs. Where does patient live home. Can  patient participate in ADLs? Yes.  No Known Allergies  Family History:  Family History  Problem Relation Age of Onset  . Heart failure Brother       Prior to Admission medications   Medication Sig Start Date End Date Taking? Authorizing Provider  acetaminophen (TYLENOL) 500 MG tablet Take 1,000 mg by mouth every 6 (six) hours as needed for headache.   Yes Historical Provider, MD  aspirin 81 MG chewable tablet Chew 81 mg by mouth daily.    Yes Historical Provider, MD  buPROPion (WELLBUTRIN XL) 150 MG 24 hr tablet Take 75 mg by mouth daily.    Yes Historical Provider, MD  desvenlafaxine (PRISTIQ) 50 MG 24 hr tablet Take 25 mg by mouth every evening.    Yes Historical Provider, MD  esomeprazole (NEXIUM) 40 MG packet Take 40 mg by mouth daily as needed (acid reflux).    Yes Historical Provider, MD  ferrous sulfate (KP FERROUS SULFATE) 325 (65 FE) MG tablet Take 325 mg by mouth daily.   Yes Historical Provider, MD  isosorbide mononitrate (IMDUR) 30 MG 24 hr tablet Take 30 mg by mouth daily.   Yes Historical Provider, MD  lisinopril (PRINIVIL,ZESTRIL) 10 MG tablet Take 0.5 tablets (5 mg total) by mouth daily. 08/17/12  Yes Scott Noble, MD  metoprolol succinate (TOPROL-XL) 25 MG 24 hr tablet Take 25 mg by mouth every evening.    Yes Historical Provider,  MD  naproxen sodium (ANAPROX) 220 MG tablet Take 220 mg by mouth daily as needed (pain).   Yes Historical Provider, MD  rosuvastatin (CRESTOR) 10 MG tablet Take 5 mg by mouth every evening.    Yes Historical Provider, MD  vitamin B-12 (CYANOCOBALAMIN) 1000 MCG tablet Take 1,000 mcg by mouth every evening.    Yes Historical Provider, MD    Physical Exam: Filed Vitals:   03/24/13 1650 03/24/13 1945 03/24/13 2000 03/24/13 2015  BP: 117/44 135/59 138/59   Pulse: 64 64 57 58  Temp: 97.6 F (36.4 C) 98 F (36.7 C) 98 F (36.7 C)   TempSrc: Oral Oral    Resp: 16 20 23 19   Height: 5\' 7"  (1.702 m)     Weight: 68.947 kg (152 lb)     SpO2: 100%  100% 100% 100%     General:  Well-developed well-nourished.  Eyes: Anicteric no pallor.  ENT: No discharge from the ears eyes nose mouth.  Neck: No mass felt.  Cardiovascular: S1-S2 heard.  Respiratory: No rhonchi or crepitations.  Abdomen: Soft nontender bowel sounds present.  Skin: No rash.  Musculoskeletal: No edema.  Psychiatric: Appears normal.  Neurologic: Alert awake oriented to time place and person. Moves all extremities.  Labs on Admission:  Basic Metabolic Panel:  Recent Labs Lab 03/24/13 1706  NA 138  K 4.6  CL 107  CO2 20  GLUCOSE 107*  BUN 46*  CREATININE 1.90*  CALCIUM 8.9   Liver Function Tests:  Recent Labs Lab 03/24/13 1706  AST 16  ALT 12  ALKPHOS 50  BILITOT 0.1*  PROT 6.4  ALBUMIN 3.6   No results found for this basename: LIPASE, AMYLASE,  in the last 168 hours No results found for this basename: AMMONIA,  in the last 168 hours CBC:  Recent Labs Lab 03/24/13 1706  WBC 10.9*  NEUTROABS 8.1*  HGB 7.0*  HCT 22.2*  MCV 83.8  PLT 239   Cardiac Enzymes: No results found for this basename: CKTOTAL, CKMB, CKMBINDEX, TROPONINI,  in the last 168 hours  BNP (last 3 results) No results found for this basename: PROBNP,  in the last 8760 hours CBG: No results found for this basename: GLUCAP,  in the last 168 hours  Radiological Exams on Admission: Dg Abd Acute W/chest  03/24/2013   CLINICAL DATA:  Anemia, shortness of breath, coronary disease  EXAM: ACUTE ABDOMEN SERIES (ABDOMEN 2 VIEW & CHEST 1 VIEW)  COMPARISON:  08/15/2012  FINDINGS: Prior coronary bypass changes noted. Mild cardiomegaly without CHF or pneumonia. Large hiatal hernia evident. No effusion or pneumothorax. Trachea midline.  No free air evident. Scattered air and stool throughout the bowel. No dilatation or obstruction pattern. Negative for ileus. Prior cholecystectomy noted. Degenerative changes of the spine, pelvis and hips. Pelvic calcifications consistent with  venous phleboliths. Bones are osteopenic.  IMPRESSION: Cardiomegaly without CHF  Large hiatal hernia  Negative for obstruction or free air   Electronically Signed   By: Scott [name] M.D.   On: 03/24/2013 19:44    Assessment/Plan Principal Problem:   GI bleed Active Problems:   CAD   Anemia   CKD (chronic kidney disease)   1. Acute GI bleed - most likely upper GI bleed. Patient is presently hemodynamically stable. Presently receiving blood transfusion 2 units PRBCs. I have discussed with Dr. Bosie Clos on-call gastroenterologist who will be seeing patient in in consult and patient will be checked in few in anticipation of possible procedure. Continue Protonix  infusion. 2. Acute blood loss anemia - transfusion ordered. Follow CBC. 3. CAD status post CABG - hold aspirin for now due to GI bleed. 4. Hypertension - hold antihypertensives for now due to GI bleed. 5. Probable chronic kidney disease - follow metabolic panel and intake output closely.    Code Status: Full code.  Family Communication: Patient's wife at the bedside.  Disposition Plan: Admit to inpatient.    Tammee Thielke N. [facility] Pager [phone].  If 7PM-7AM, please contact night-coverage [email or web address] Password [facility] 03/24/2013, 8:19 PM

## 2013-03-24 NOTE — ED Provider Notes (Signed)
CSN: [id number]     Arrival date & time 03/24/13  1646 History   First MD Initiated Contact with Patient 03/24/13 1809     Chief Complaint  Patient presents with  . Abnormal Lab   (Consider location/radiation/quality/duration/timing/severity/associated sxs/prior Treatment) HPI Comments: Patient presents with a 3 to four-day history of generalized weakness and lightheadedness. He was seen by his PCP Dr. Kevan Ny today who performed a CBC noted to have anemia. He is referred to the ED. He endorses black stools over the past 4 days. He one episode of emesis this morning denies seeing any blood in it. He denies any abdominal pain, chest pain or shortness of breath. He is on aspirin but no other anticoagulants. He is also taking some Aleve. He reports having an endoscopy several years ago in [place] where they told him he had irritation of the stomach lining. He is no longer taking iron. He felt weak this morning and collapsed onto the bed but did not hit his head or lose consciousness.  The history is provided by the patient and a relative.    Past Medical History  Diagnosis Date  . Hypertension   . Anxiety   . Depression   . GERD (gastroesophageal reflux disease)   . Hyperlipidemia   . CAD (coronary artery disease)     with CABG in 1983  . Iron deficiency anemia   . Erectile dysfunction   . Vasovagal syncope 05/2006  . Hx of adenomatous colonic polyps 2007  . Sigmoid diverticulosis   . Dyspnea 2010    syndrome - extensive  . Weight loss   . Urethral stricture 11/2008  . Shingles 1990's  . Inguinal hernia    Past Surgical History  Procedure Laterality Date  . Coronary artery bypass graft    . Cholecystectomy    . Hernia repair     Family History  Problem Relation Age of Onset  . Heart failure Brother    History  Substance Use Topics  . Smoking status: Never Smoker   . Smokeless tobacco: Never Used  . Alcohol Use: 3.5 oz/week    7 drink(s) per week     Comment: wine - daily     Review of Systems  Constitutional: Positive for activity change, appetite change and fatigue. Negative for fever.  HENT: Negative for rhinorrhea and trouble swallowing.   Eyes: Negative for visual disturbance.  Respiratory: Negative for cough, chest tightness and shortness of breath.   Cardiovascular: Negative for chest pain.  Gastrointestinal: Negative for nausea and vomiting.  Genitourinary: Negative for dysuria and hematuria.  Musculoskeletal: Negative for back pain.  Neurological: Positive for weakness and light-headedness. Negative for dizziness and headaches.  A complete 10 system review of systems was obtained and all systems are negative except as noted in the HPI and PMH.    Allergies  Review of patient's allergies indicates no known allergies.  Home Medications   Current Outpatient Rx  Name  Route  Sig  Dispense  Refill  . acetaminophen (TYLENOL) 500 MG tablet   Oral   Take 1,000 mg by mouth every 6 (six) hours as needed for headache.         [name] aspirin 81 MG chewable tablet   Oral   Chew 81 mg by mouth daily.          [name] buPROPion (WELLBUTRIN XL) 150 MG 24 hr tablet   Oral   Take 75 mg by mouth daily.          [name]  desvenlafaxine (PRISTIQ) 50 MG 24 hr tablet   Oral   Take 25 mg by mouth every evening.          [name] esomeprazole (NEXIUM) 40 MG packet   Oral   Take 40 mg by mouth daily as needed (acid reflux).          . ferrous sulfate (KP FERROUS SULFATE) 325 (65 FE) MG tablet   Oral   Take 325 mg by mouth daily.         . isosorbide mononitrate (IMDUR) 30 MG 24 hr tablet   Oral   Take 30 mg by mouth daily.         [name] lisinopril (PRINIVIL,ZESTRIL) 10 MG tablet   Oral   Take 0.5 tablets (5 mg total) by mouth daily.   30 tablet   0   . metoprolol succinate (TOPROL-XL) 25 MG 24 hr tablet   Oral   Take 25 mg by mouth every evening.          . naproxen sodium (ANAPROX) 220 MG tablet   Oral   Take 220 mg by mouth daily as needed (pain).          . rosuvastatin (CRESTOR) 10 MG tablet   Oral   Take 5 mg by mouth every evening.          . vitamin B-12 (CYANOCOBALAMIN) 1000 MCG tablet   Oral   Take 1,000 mcg by mouth every evening.           BP 135/59  Pulse 64  Temp(Src) 98 F (36.7 C) (Oral)  Resp 20  Ht 5\' 7"  (1.702 m)  Wt 152 lb (68.947 kg)  BMI 23.80 kg/m2  SpO2 100% Physical Exam  Constitutional: He is oriented to person, place, and time. He appears well-developed and well-nourished. No distress.  HENT:  Head: Normocephalic and atraumatic.  Mouth/Throat: Oropharynx is clear and moist. No oropharyngeal exudate.  Eyes: Conjunctivae and EOM are normal. Pupils are equal, round, and reactive to light.  Neck: Normal range of motion. Neck supple.  Cardiovascular: Normal rate, regular rhythm and normal heart sounds.   No murmur heard. Pulmonary/Chest: Effort normal and breath sounds normal. No respiratory distress.  Abdominal: Soft. He exhibits distension. There is no tenderness. There is no rebound and no guarding.  Reducible right inguinal hernia  Genitourinary: Guaiac positive stool.  No hemorrhoids, black stool on examining finger  Musculoskeletal: Normal range of motion. He exhibits no edema and no tenderness.  Neurological: He is alert and oriented to person, place, and time. No cranial nerve deficit. He exhibits normal muscle tone. Coordination normal.  Skin: Skin is warm.    ED Course  Procedures (including critical care time) Labs Review Labs Reviewed  CBC WITH DIFFERENTIAL - Abnormal; Notable for the following:    WBC 10.9 (*)    RBC 2.65 (*)    Hemoglobin 7.0 (*)    HCT 22.2 (*)    RDW 16.1 (*)    Neutro Abs 8.1 (*)    All other components within normal limits  COMPREHENSIVE METABOLIC PANEL - Abnormal; Notable for the following:    Glucose, Bld 107 (*)    BUN 46 (*)    Creatinine, Ser 1.90 (*)    Total Bilirubin 0.1 (*)    GFR calc non Af Amer 30 (*)    GFR calc Af Amer 35 (*)    All  other components within normal limits  PROTIME-INR  APTT  TROPONIN I  TYPE AND SCREEN  ABO/RH  PREPARE RBC (CROSSMATCH)   Imaging Review Dg Abd Acute W/chest  03/24/2013   CLINICAL DATA:  Anemia, shortness of breath, coronary disease  EXAM: ACUTE ABDOMEN SERIES (ABDOMEN 2 VIEW & CHEST 1 VIEW)  COMPARISON:  08/15/2012  FINDINGS: Prior coronary bypass changes noted. Mild cardiomegaly without CHF or pneumonia. Large hiatal hernia evident. No effusion or pneumothorax. Trachea midline.  No free air evident. Scattered air and stool throughout the bowel. No dilatation or obstruction pattern. Negative for ileus. Prior cholecystectomy noted. Degenerative changes of the spine, pelvis and hips. Pelvic calcifications consistent with venous phleboliths. Bones are osteopenic.  IMPRESSION: Cardiomegaly without CHF  Large hiatal hernia  Negative for obstruction or free air   Electronically Signed   By: Ruel Favors M.D.   On: 03/24/2013 19:44    EKG Interpretation     Ventricular Rate:  63 PR Interval:  193 QRS Duration: 73 QT Interval:  431 QTC Calculation: 441 R Axis:   68 Text Interpretation:  Normal sinus rhythm No significant change was found            MDM   1. GI bleed   2. Melena   3. Anemia    Melena with anemia and generalized weakness. Vitals stable, no distress. No use of anticoagulants.  Hemoglobin today is 7, baseline is around 11. Patient will be given IV fluids, PPI, prepare for blood transfusion.   2 units of blood ordered. IV protonix given. Vitals remained stable in the ED. Abdomen soft and nontender. Additional IV access established. Patient will be admitted for melena, anemia and GI bleed.  Glynn Octave, MD 03/24/13 2015

## 2013-03-24 NOTE — Consult Note (Signed)
Referring Provider: Dr. Toniann Fail Primary Care Physician:  Pearla Dubonnet, MD Primary Gastroenterologist:  Dr. Laural Benes  Reason for Consultation:  GI bleed  HPI: Scott [name] is a 79 y.o. male admitted for weakness and dizziness in the setting of 4-5 days of melena. He has been having one formed to loose black stool per day since onset. Vomited his breakfast this morning but denies hematemesis or coffee grounds emesis. No other vomiting besides this morning. Weakness has been going on since onset of the melena. Hgb 7, which is down 5 grams recently. Denies abdominal pain. Took one dose of Aleve this week but denies other NSAIDs but he is on Aspirin and Naprosyn at home. Denies hx of ulcers. Remote history of EGD in the past but records not available at this time. Reports having a colonoscopy within the past 2 years by Dr. Laural Benes that was ok. Hgb 11.2 on 08/16/12 when he was hospitalized for syncope (thought to be due to dehydration and BP meds at that time). Denies chest pain or shortness of breath.  Past Medical History  Diagnosis Date  . Hypertension   . Anxiety   . Depression   . GERD (gastroesophageal reflux disease)   . Hyperlipidemia   . CAD (coronary artery disease)     with CABG in 1983  . Iron deficiency anemia   . Erectile dysfunction   . Vasovagal syncope 05/2006  . Hx of adenomatous colonic polyps 2007  . Sigmoid diverticulosis   . Dyspnea 2010    syndrome - extensive  . Weight loss   . Urethral stricture 11/2008  . Shingles 1990's  . Inguinal hernia     Past Surgical History  Procedure Laterality Date  . Coronary artery bypass graft    . Cholecystectomy    . Hernia repair      Prior to Admission medications   Medication Sig Start Date End Date Taking? Authorizing Provider  acetaminophen (TYLENOL) 500 MG tablet Take 1,000 mg by mouth every 6 (six) hours as needed for headache.   Yes Historical Provider, MD  aspirin 81 MG chewable tablet Chew 81 mg by  mouth daily.    Yes Historical Provider, MD  buPROPion (WELLBUTRIN XL) 150 MG 24 hr tablet Take 75 mg by mouth daily.    Yes Historical Provider, MD  desvenlafaxine (PRISTIQ) 50 MG 24 hr tablet Take 25 mg by mouth every evening.    Yes Historical Provider, MD  esomeprazole (NEXIUM) 40 MG packet Take 40 mg by mouth daily as needed (acid reflux).    Yes Historical Provider, MD  ferrous sulfate (KP FERROUS SULFATE) 325 (65 FE) MG tablet Take 325 mg by mouth daily.   Yes Historical Provider, MD  isosorbide mononitrate (IMDUR) 30 MG 24 hr tablet Take 30 mg by mouth daily.   Yes Historical Provider, MD  lisinopril (PRINIVIL,ZESTRIL) 10 MG tablet Take 0.5 tablets (5 mg total) by mouth daily. 08/17/12  Yes Marden Noble, MD  metoprolol succinate (TOPROL-XL) 25 MG 24 hr tablet Take 25 mg by mouth every evening.    Yes Historical Provider, MD  naproxen sodium (ANAPROX) 220 MG tablet Take 220 mg by mouth daily as needed (pain).   Yes Historical Provider, MD  rosuvastatin (CRESTOR) 10 MG tablet Take 5 mg by mouth every evening.    Yes Historical Provider, MD  vitamin B-12 (CYANOCOBALAMIN) 1000 MCG tablet Take 1,000 mcg by mouth every evening.    Yes Historical Provider, MD    Scheduled Meds: . pantoprazole (  PROTONIX) IV  80 mg Intravenous Once  . [START ON 03/28/2013] pantoprazole (PROTONIX) IV  40 mg Intravenous Q12H  . sodium chloride  3 mL Intravenous Q12H   Continuous Infusions: . sodium chloride    . pantoprozole (PROTONIX) infusion     PRN Meds:.acetaminophen, acetaminophen, ondansetron (ZOFRAN) IV, ondansetron  Allergies as of 03/24/2013  . (No Known Allergies)    Family History  Problem Relation Age of Onset  . Heart failure Brother     History   Social History  . Marital Status: Married    Spouse Name: N/A    Number of Children: N/A  . Years of Education: N/A   Occupational History  . Not on file.   Social History Main Topics  . Smoking status: Never Smoker   . Smokeless  tobacco: Never Used  . Alcohol Use: 3.5 oz/week    7 drink(s) per week     Comment: wine - daily  . Drug Use: No  . Sexual Activity: No   Other Topics Concern  . Not on file   Social History Narrative  . No narrative on file    Review of Systems: All negative from a GI standpoint except as stated above in HPI.  Physical Exam: Vital signs: Filed Vitals:   03/24/13 2200  BP: 145/71  Pulse: 58  Temp: 98.7 F (37.1 C)  Resp: 18     General:   Alert,  Elderly, Well-developed, well-nourished, pleasant and cooperative in NAD HEENT: anicteric Neck: supple, nontender Lungs:  Clear throughout to auscultation.   No wheezes, crackles, or rhonchi. No acute distress. Heart:  Regular rate and rhythm; no murmurs, clicks, rubs,  or gallops. Abdomen: RLQ tenderness with minimal guarding, otherwise nontender, soft, nondistended, +BS Rectal:  Deferred Ext: no edema Skin: no rash  GI:  Lab Results:  Recent Labs  03/24/13 1706  WBC 10.9*  HGB 7.0*  HCT 22.2*  PLT 239   BMET  Recent Labs  03/24/13 1706  NA 138  K 4.6  CL 107  CO2 20  GLUCOSE 107*  BUN 46*  CREATININE 1.90*  CALCIUM 8.9   LFT  Recent Labs  03/24/13 1706  PROT 6.4  ALBUMIN 3.6  AST 16  ALT 12  ALKPHOS 50  BILITOT 0.1*   PT/INR  Recent Labs  03/24/13 1706  LABPROT 12.8  INR 0.98     Studies/Results: Dg Abd Acute W/chest  03/24/2013   CLINICAL DATA:  Anemia, shortness of breath, coronary disease  EXAM: ACUTE ABDOMEN SERIES (ABDOMEN 2 VIEW & CHEST 1 VIEW)  COMPARISON:  08/15/2012  FINDINGS: Prior coronary bypass changes noted. Mild cardiomegaly without CHF or pneumonia. Large hiatal hernia evident. No effusion or pneumothorax. Trachea midline.  No free air evident. Scattered air and stool throughout the bowel. No dilatation or obstruction pattern. Negative for ileus. Prior cholecystectomy noted. Degenerative changes of the spine, pelvis and hips. Pelvic calcifications consistent with venous  phleboliths. Bones are osteopenic.  IMPRESSION: Cardiomegaly without CHF  Large hiatal hernia  Negative for obstruction or free air   Electronically Signed   By: Ruel Favors M.D.   On: 03/24/2013 19:44    Impression/Plan: 79 yo with 5 days of melena causing weakness and dizziness concerning for a peptic ulcer bleed. Hemodynamically stable. Protonix drip. Agree with blood transfusion. Clears. NPO p MN. EGD tomorrow planned.    LOS: 0 days   Camaya Gannett C.  03/24/2013, 10:22 PM

## 2013-03-24 NOTE — ED Notes (Signed)
Patient transported to X-ray 

## 2013-03-24 NOTE — ED Notes (Signed)
MD at bedside. 

## 2013-03-25 ENCOUNTER — Encounter (HOSPITAL_COMMUNITY): Payer: Self-pay | Admitting: Gastroenterology

## 2013-03-25 ENCOUNTER — Encounter (HOSPITAL_COMMUNITY)
Admission: EM | Disposition: A | Discharge status: Home / Self Care | Payer: Self-pay | Source: Home / Self Care | Attending: Internal Medicine

## 2013-03-25 DIAGNOSIS — I2510 Atherosclerotic heart disease of native coronary artery without angina pectoris: Secondary | ICD-10-CM | POA: Diagnosis not present

## 2013-03-25 DIAGNOSIS — D649 Anemia, unspecified: Secondary | ICD-10-CM | POA: Diagnosis not present

## 2013-03-25 DIAGNOSIS — K922 Gastrointestinal hemorrhage, unspecified: Secondary | ICD-10-CM | POA: Diagnosis not present

## 2013-03-25 HISTORY — PX: ESOPHAGOGASTRODUODENOSCOPY: SHX5428

## 2013-03-25 LAB — CBC
Hemoglobin: 8.1 g/dL — ABNORMAL LOW (ref 13.0–17.0)
MCH: 27.6 pg (ref 26.0–34.0)
MCHC: 32.4 g/dL (ref 30.0–36.0)
MCV: 85 fL (ref 78.0–100.0)
Platelets: 173 10*3/uL (ref 150–400)
RDW: 15.2 % (ref 11.5–15.5)
WBC: 8 10*3/uL (ref 4.0–10.5)

## 2013-03-25 LAB — COMPREHENSIVE METABOLIC PANEL
ALT: 10 U/L (ref 0–53)
AST: 13 U/L (ref 0–37)
Albumin: 2.9 g/dL — ABNORMAL LOW (ref 3.5–5.2)
Alkaline Phosphatase: 43 U/L (ref 39–117)
BUN: 39 mg/dL — ABNORMAL HIGH (ref 6–23)
CO2: 19 mEq/L (ref 19–32)
Chloride: 114 mEq/L — ABNORMAL HIGH (ref 96–112)
GFR calc Af Amer: 34 mL/min — ABNORMAL LOW (ref >90)
GFR calc non Af Amer: 30 mL/min — ABNORMAL LOW (ref >90)
Potassium: 4.5 mEq/L (ref 3.5–5.1)
Total Bilirubin: 0.5 mg/dL (ref 0.3–1.2)

## 2013-03-25 LAB — TYPE AND SCREEN
ABO/RH(D): O POS
Antibody Screen: NEGATIVE
Unit division: 0
Unit division: 0

## 2013-03-25 LAB — GLUCOSE, CAPILLARY

## 2013-03-25 SURGERY — EGD (ESOPHAGOGASTRODUODENOSCOPY)
Anesthesia: Moderate Sedation

## 2013-03-25 MED ORDER — MIDAZOLAM HCL 5 MG/ML IJ SOLN
INTRAMUSCULAR | Status: AC
  Filled 2013-03-25: qty 2

## 2013-03-25 MED ORDER — FENTANYL CITRATE 0.05 MG/ML IJ SOLN
INTRAMUSCULAR | Status: AC
  Filled 2013-03-25: qty 2

## 2013-03-25 MED ORDER — BIOTENE DRY MOUTH MT LIQD
15.0000 mL | Freq: Two times a day (BID) | OROMUCOSAL | Status: DC
  Administered 2013-03-25 – 2013-03-26 (×2): 15 mL via OROMUCOSAL

## 2013-03-25 MED ORDER — FENTANYL CITRATE 0.05 MG/ML IJ SOLN
INTRAMUSCULAR | Status: DC | PRN
  Administered 2013-03-25: 25 ug via INTRAVENOUS

## 2013-03-25 MED ORDER — SODIUM CHLORIDE 0.9 % IV SOLN: INTRAVENOUS | Status: DC

## 2013-03-25 MED ORDER — MIDAZOLAM HCL 10 MG/2ML IJ SOLN
INTRAMUSCULAR | Status: DC | PRN
  Administered 2013-03-25: 2 mg via INTRAVENOUS

## 2013-03-25 MED ORDER — BUTAMBEN-TETRACAINE-BENZOCAINE 2-2-14 % EX AERO
INHALATION_SPRAY | CUTANEOUS | Status: DC | PRN
  Administered 2013-03-25: 2 via TOPICAL

## 2013-03-25 NOTE — Op Note (Signed)
[facility] [address] [place], [address]   ENDOSCOPY PROCEDURE REPORT  PATIENT: Scott [name], Scott [name]  MR#: [id number] BIRTHDATE: Jul 12, 1923 , 89  yrs. old GENDER: Male ENDOSCOPIST:Jakaree Pickard Madilyn Fireman, MD REFERRED BY: PROCEDURE DATE:  03/25/2013 PROCEDURE: ASA CLASS: INDICATIONS:   melena and heme positive stool MEDICATION:    fentanyl 25 mcg, Versed 2 mg TOPICAL ANESTHETIC:  DESCRIPTION OF PROCEDURE:   esophagus normal to the squamocolumnar line Stomach large distorted hiatal hernia possibly paraesophageal component the proximal 8 cm in length. Small linear gastric erosions with a small amount of exudate in the antrum, no definite ulcer no stigmata of hemorrhage. CLOtest obtained Duodenum: Proximal bulb normal, elevated ridge at the junction of the first and second portion with somewhat of a blind area behind it could not rule out duodenal ulcer mild bulbar duodenitis.     COMPLICATIONS: None  ENDOSCOPIC IMPRESSION:large hiatal hernia with possible paraesophageal component Gastric erosions Duodenitis, cannot rule out duodenal ulcer in the junction of the first and second portion  RECOMMENDATIONS:treat with PPI and avoid NSAIDs. Await CLOtest and treat for reticulocyte H. and if Helicobacter if positive    _______________________________ Rosalie DoctorDorena Cookey, MD 03/25/2013 2:41 PM

## 2013-03-25 NOTE — Progress Notes (Signed)
NURSING PROGRESS NOTE  Scott [name] [id number] Admission Data: 03/25/2013 5:28 AM Attending Provider: Marden Noble, MD WUJ:WJXBJ,YNWGNF NEVILL, MD Code Status: full code  Scott [name] is a 79 y.o. male patient admitted from ED:  -No acute distress noted.  -No complaints of shortness of breath.  -No complaints of chest pain.   Cardiac Monitoring: Box # TX 08  in place. Cardiac monitor yields:sinus bradycardia.  Blood pressure 118/64, pulse 58, temperature 97.5 F (36.4 C), temperature source Oral, resp. rate 20, height 5\' 7"  (1.702 m), weight 70.398 kg (155 lb 3.2 oz), SpO2 98.00%.   IV Fluids:  IV in place, occlusive dsg intact without redness, IV cath forearm left, condition patent and no redness normal saline.   Allergies:  Review of patient's allergies indicates no known allergies.  Past Medical History:   has a past medical history of Hypertension; Anxiety; Depression; GERD (gastroesophageal reflux disease); Hyperlipidemia; CAD (coronary artery disease); Iron deficiency anemia; Erectile dysfunction; Vasovagal syncope (05/2006); adenomatous colonic polyps (2007); Sigmoid diverticulosis; Weight loss; Urethral stricture (11/2008); Shingles (1990's); Inguinal hernia; Anginal pain (~ 1992); Claudication; Dyspnea (2010); Exertional shortness of breath; History of blood transfusion; and H/O hiatal hernia.  Past Surgical History:   has past surgical history that includes Cholecystectomy (1980's); Coronary artery bypass graft (~ 1992); and Inguinal hernia repair (Left).  Social History:   reports that he has never smoked. He has never used smokeless tobacco. He reports that he drinks about 4.2 ounces of alcohol per week. He reports that he does not use illicit drugs.  Skin: NSI  Patient/Family orientated to room. Information packet given to patient/family. Admission inpatient armband information verified with patient/family to include name and date of birth and placed on patient  arm. Side rails up x 2, fall assessment and education completed with patient/family. Patient/family able to verbalize understanding of risk associated with falls and verbalized understanding to call for assistance before getting out of bed. Call light within reach. Patient/family able to voice and demonstrate understanding of unit orientation instructions.    Will continue to evaluate and treat per MD orders.

## 2013-03-25 NOTE — Progress Notes (Signed)
Subjective: Hemoglobin up to 9.6!!  Objective: Weight change:   Intake/Output Summary (Last 24 hours) at 03/25/13 1303 Last data filed at 03/25/13 1100  Gross per 24 hour  Intake   1355 ml  Output    780 ml  Net    575 ml   Filed Vitals:   03/25/13 0108 03/25/13 0208 03/25/13 0308 03/25/13 0455  BP: 105/57 109/60 116/60 118/64  Pulse: 60 60 58 58  Temp: 98.3 F (36.8 C) 98.4 F (36.9 C) 98.6 F (37 C) 97.5 F (36.4 C)  TempSrc: Oral Oral Oral Oral  Resp: 18 18 18 20   Height:      Weight:    70.398 kg (155 lb 3.2 oz)  SpO2: 98% 99% 99% 98%    Lab Results: Results for orders placed during the hospital encounter of 03/24/13 (from the past 48 hour(s))  TYPE AND SCREEN     Status: None   Collection Time    03/24/13  5:00 PM      Result Value Range   ABO/RH(D) O POS     Antibody Screen NEG     Sample Expiration 03/27/2013     Unit Number [id number]     Blood Component Type RBC LR PHER1     Unit division 00     Status of Unit ISSUED,FINAL     Transfusion Status OK TO TRANSFUSE     Crossmatch Result Compatible     Unit Number [id number]     Blood Component Type RED CELLS,LR     Unit division 00     Status of Unit ISSUED,FINAL     Transfusion Status OK TO TRANSFUSE     Crossmatch Result Compatible    ABO/RH     Status: None   Collection Time    03/24/13  5:00 PM      Result Value Range   ABO/RH(D) O POS    CBC WITH DIFFERENTIAL     Status: Abnormal   Collection Time    03/24/13  5:06 PM      Result Value Range   WBC 10.9 (*) 4.0 - 10.5 K/uL   RBC 2.65 (*) 4.22 - 5.81 MIL/uL   Hemoglobin 7.0 (*) 13.0 - 17.0 g/dL   Comment: REPEATED TO VERIFY   HCT 22.2 (*) 39.0 - 52.0 %   MCV 83.8  78.0 - 100.0 fL   MCH 26.4  26.0 - 34.0 pg   MCHC 31.5  30.0 - 36.0 g/dL   RDW 30.8 (*) 65.7 - 84.6 %   Platelets 239  150 - 400 K/uL   Neutrophils Relative % 75  43 - 77 %   Neutro Abs 8.1 (*) 1.7 - 7.7 K/uL   Lymphocytes Relative 15  12 - 46 %   Lymphs Abs 1.6  0.7 -  4.0 K/uL   Monocytes Relative 8  3 - 12 %   Monocytes Absolute 0.9  0.1 - 1.0 K/uL   Eosinophils Relative 2  0 - 5 %   Eosinophils Absolute 0.2  0.0 - 0.7 K/uL   Basophils Relative 1  0 - 1 %   Basophils Absolute 0.1  0.0 - 0.1 K/uL  COMPREHENSIVE METABOLIC PANEL     Status: Abnormal   Collection Time    03/24/13  5:06 PM      Result Value Range   Sodium 138  135 - 145 mEq/L   Potassium 4.6  3.5 - 5.1 mEq/L   Chloride 107  96 -  112 mEq/L   CO2 20  19 - 32 mEq/L   Glucose, Bld 107 (*) 70 - 99 mg/dL   BUN 46 (*) 6 - 23 mg/dL   Creatinine, Ser 4.09 (*) 0.50 - 1.35 mg/dL   Calcium 8.9  8.4 - 81.1 mg/dL   Total Protein 6.4  6.0 - 8.3 g/dL   Albumin 3.6  3.5 - 5.2 g/dL   AST 16  0 - 37 U/L   ALT 12  0 - 53 U/L   Alkaline Phosphatase 50  39 - 117 U/L   Total Bilirubin 0.1 (*) 0.3 - 1.2 mg/dL   GFR calc non Af Amer 30 (*) >90 mL/min   GFR calc Af Amer 35 (*) >90 mL/min   Comment: (NOTE)     The eGFR has been calculated using the CKD EPI equation.     This calculation has not been validated in all clinical situations.     eGFR's persistently <90 mL/min signify possible Chronic Kidney     Disease.  PROTIME-INR     Status: None   Collection Time    03/24/13  5:06 PM      Result Value Range   Prothrombin Time 12.8  11.6 - 15.2 seconds   INR 0.98  0.00 - 1.49  APTT     Status: None   Collection Time    03/24/13  5:06 PM      Result Value Range   aPTT 26  24 - 37 seconds  PREPARE RBC (CROSSMATCH)     Status: None   Collection Time    03/24/13  6:35 PM      Result Value Range   Order Confirmation ORDER PROCESSED BY BLOOD BANK    OCCULT BLOOD, POC DEVICE     Status: Abnormal   Collection Time    03/24/13  8:16 PM      Result Value Range   Fecal Occult Bld POSITIVE (*) NEGATIVE  GLUCOSE, CAPILLARY     Status: None   Collection Time    03/24/13 11:52 PM      Result Value Range   Glucose-Capillary 82  70 - 99 mg/dL  COMPREHENSIVE METABOLIC PANEL     Status: Abnormal    Collection Time    03/25/13  5:04 AM      Result Value Range   Sodium 143  135 - 145 mEq/L   Potassium 4.5  3.5 - 5.1 mEq/L   Chloride 114 (*) 96 - 112 mEq/L   CO2 19  19 - 32 mEq/L   Glucose, Bld 90  70 - 99 mg/dL   BUN 39 (*) 6 - 23 mg/dL   Creatinine, Ser 9.14 (*) 0.50 - 1.35 mg/dL   Calcium 8.3 (*) 8.4 - 10.5 mg/dL   Total Protein 5.3 (*) 6.0 - 8.3 g/dL   Albumin 2.9 (*) 3.5 - 5.2 g/dL   AST 13  0 - 37 U/L   ALT 10  0 - 53 U/L   Alkaline Phosphatase 43  39 - 117 U/L   Total Bilirubin 0.5  0.3 - 1.2 mg/dL   GFR calc non Af Amer 30 (*) >90 mL/min   GFR calc Af Amer 34 (*) >90 mL/min   Comment: (NOTE)     The eGFR has been calculated using the CKD EPI equation.     This calculation has not been validated in all clinical situations.     eGFR's persistently <90 mL/min signify possible Chronic Kidney  Disease.  CBC     Status: Abnormal   Collection Time    03/25/13  5:04 AM      Result Value Range   WBC 8.0  4.0 - 10.5 K/uL   RBC 2.94 (*) 4.22 - 5.81 MIL/uL   Hemoglobin 8.1 (*) 13.0 - 17.0 g/dL   HCT 16.1 (*) 09.6 - 04.5 %   MCV 85.0  78.0 - 100.0 fL   MCH 27.6  26.0 - 34.0 pg   MCHC 32.4  30.0 - 36.0 g/dL   RDW 40.9  81.1 - 91.4 %   Platelets 173  150 - 400 K/uL   Comment: DELTA CHECK NOTED     SPECIMEN CHECKED FOR CLOTS     REPEATED TO VERIFY  HEMOGLOBIN AND HEMATOCRIT, BLOOD     Status: Abnormal   Collection Time    03/25/13 11:05 AM      Result Value Range   Hemoglobin 9.6 (*) 13.0 - 17.0 g/dL   HCT 78.2 (*) 95.6 - 21.3 %  GLUCOSE, CAPILLARY     Status: None   Collection Time    03/25/13 11:35 AM      Result Value Range   Glucose-Capillary 84  70 - 99 mg/dL   Comment 1 Documented in Chart     Comment 2 Notify RN      Studies/Results: Dg Abd Acute W/chest  03/24/2013   CLINICAL DATA:  Anemia, shortness of breath, coronary disease  EXAM: ACUTE ABDOMEN SERIES (ABDOMEN 2 VIEW & CHEST 1 VIEW)  COMPARISON:  08/15/2012  FINDINGS: Prior coronary bypass changes  noted. Mild cardiomegaly without CHF or pneumonia. Large hiatal hernia evident. No effusion or pneumothorax. Trachea midline.  No free air evident. Scattered air and stool throughout the bowel. No dilatation or obstruction pattern. Negative for ileus. Prior cholecystectomy noted. Degenerative changes of the spine, pelvis and hips. Pelvic calcifications consistent with venous phleboliths. Bones are osteopenic.  IMPRESSION: Cardiomegaly without CHF  Large hiatal hernia  Negative for obstruction or free air   Electronically Signed   By: Ruel Favors M.D.   On: 03/24/2013 19:44   Medications: Scheduled Meds: . antiseptic oral rinse  15 mL Mouth Rinse BID  . [START ON 03/28/2013] pantoprazole (PROTONIX) IV  40 mg Intravenous Q12H  . sodium chloride  3 mL Intravenous Q12H   Continuous Infusions: . sodium chloride 75 mL/hr at 03/25/13 0321  . sodium chloride    . pantoprozole (PROTONIX) infusion 8 mg/hr (03/25/13 0407)   PRN Meds:.acetaminophen, acetaminophen, ondansetron (ZOFRAN) IV, ondansetron  Assessment/Plan: Principal Problem:   GI bleed - stabilizing with hemoglobin up to 9.6  Upper endoscopy pending   LOS: 1 day   Scott Dubonnet, MD 03/25/2013, 1:03 PM

## 2013-03-25 NOTE — Progress Notes (Signed)
Subjective: Is feeling better this morning after 2 units of packed red cells.  Hemoglobin only went up from 7-8.  It was 6.7 in my office yesterday morning.  I was not aware that he was taking some Aleve as well as his daily aspirin.  He also was not routinely taking his PPI therapy.... as instructed  Objective: Weight change:   Intake/Output Summary (Last 24 hours) at 03/25/13 0731 Last data filed at 03/25/13 0659  Gross per 24 hour  Intake   1355 ml  Output    580 ml  Net    775 ml   Filed Vitals:   03/25/13 0108 03/25/13 0208 03/25/13 0308 03/25/13 0455  BP: 105/57 109/60 116/60 118/64  Pulse: 60 60 58 58  Temp: 98.3 F (36.8 C) 98.4 F (36.9 C) 98.6 F (37 C) 97.5 F (36.4 C)  TempSrc: Oral Oral Oral Oral  Resp: 18 18 18 20   Height:      Weight:    70.398 kg (155 lb 3.2 oz)  SpO2: 98% 99% 99% 98%    General: Alert, Elderly, Well-developed, well-nourished, pleasant and cooperative in NAD  HEENT: anicteric  Neck: supple, nontender  Lungs: Clear throughout to auscultation. No wheezes, crackles, or rhonchi. No acute distress.  Heart: Regular rate and rhythm; no murmurs, clicks, rubs, or gallops.  Abdomen: No significant tenderness today Rectal: Stool heme positive yesterday Ext: no edema  Skin: no rash   Lab Results: Results for orders placed during the hospital encounter of 03/24/13 (from the past 48 hour(s))  TYPE AND SCREEN     Status: None   Collection Time    03/24/13  5:00 PM      Result Value Range   ABO/RH(D) O POS     Antibody Screen NEG     Sample Expiration 03/27/2013     Unit Number [id number]     Blood Component Type RBC LR PHER1     Unit division 00     Status of Unit ISSUED     Transfusion Status OK TO TRANSFUSE     Crossmatch Result Compatible     Unit Number [id number]     Blood Component Type RED CELLS,LR     Unit division 00     Status of Unit ISSUED     Transfusion Status OK TO TRANSFUSE     Crossmatch Result Compatible    ABO/RH      Status: None   Collection Time    03/24/13  5:00 PM      Result Value Range   ABO/RH(D) O POS    CBC WITH DIFFERENTIAL     Status: Abnormal   Collection Time    03/24/13  5:06 PM      Result Value Range   WBC 10.9 (*) 4.0 - 10.5 K/uL   RBC 2.65 (*) 4.22 - 5.81 MIL/uL   Hemoglobin 7.0 (*) 13.0 - 17.0 g/dL   Comment: REPEATED TO VERIFY   HCT 22.2 (*) 39.0 - 52.0 %   MCV 83.8  78.0 - 100.0 fL   MCH 26.4  26.0 - 34.0 pg   MCHC 31.5  30.0 - 36.0 g/dL   RDW 30.8 (*) 65.7 - 84.6 %   Platelets 239  150 - 400 K/uL   Neutrophils Relative % 75  43 - 77 %   Neutro Abs 8.1 (*) 1.7 - 7.7 K/uL   Lymphocytes Relative 15  12 - 46 %   Lymphs Abs 1.6  0.7 -  4.0 K/uL   Monocytes Relative 8  3 - 12 %   Monocytes Absolute 0.9  0.1 - 1.0 K/uL   Eosinophils Relative 2  0 - 5 %   Eosinophils Absolute 0.2  0.0 - 0.7 K/uL   Basophils Relative 1  0 - 1 %   Basophils Absolute 0.1  0.0 - 0.1 K/uL  COMPREHENSIVE METABOLIC PANEL     Status: Abnormal   Collection Time    03/24/13  5:06 PM      Result Value Range   Sodium 138  135 - 145 mEq/L   Potassium 4.6  3.5 - 5.1 mEq/L   Chloride 107  96 - 112 mEq/L   CO2 20  19 - 32 mEq/L   Glucose, Bld 107 (*) 70 - 99 mg/dL   BUN 46 (*) 6 - 23 mg/dL   Creatinine, Ser 4.54 (*) 0.50 - 1.35 mg/dL   Calcium 8.9  8.4 - 09.8 mg/dL   Total Protein 6.4  6.0 - 8.3 g/dL   Albumin 3.6  3.5 - 5.2 g/dL   AST 16  0 - 37 U/L   ALT 12  0 - 53 U/L   Alkaline Phosphatase 50  39 - 117 U/L   Total Bilirubin 0.1 (*) 0.3 - 1.2 mg/dL   GFR calc non Af Amer 30 (*) >90 mL/min   GFR calc Af Amer 35 (*) >90 mL/min   Comment: (NOTE)     The eGFR has been calculated using the CKD EPI equation.     This calculation has not been validated in all clinical situations.     eGFR's persistently <90 mL/min signify possible Chronic Kidney     Disease.  PROTIME-INR     Status: None   Collection Time    03/24/13  5:06 PM      Result Value Range   Prothrombin Time 12.8  11.6 - 15.2  seconds   INR 0.98  0.00 - 1.49  APTT     Status: None   Collection Time    03/24/13  5:06 PM      Result Value Range   aPTT 26  24 - 37 seconds  PREPARE RBC (CROSSMATCH)     Status: None   Collection Time    03/24/13  6:35 PM      Result Value Range   Order Confirmation ORDER PROCESSED BY BLOOD BANK    OCCULT BLOOD, POC DEVICE     Status: Abnormal   Collection Time    03/24/13  8:16 PM      Result Value Range   Fecal Occult Bld POSITIVE (*) NEGATIVE  GLUCOSE, CAPILLARY     Status: None   Collection Time    03/24/13 11:52 PM      Result Value Range   Glucose-Capillary 82  70 - 99 mg/dL  COMPREHENSIVE METABOLIC PANEL     Status: Abnormal   Collection Time    03/25/13  5:04 AM      Result Value Range   Sodium 143  135 - 145 mEq/L   Potassium 4.5  3.5 - 5.1 mEq/L   Chloride 114 (*) 96 - 112 mEq/L   CO2 19  19 - 32 mEq/L   Glucose, Bld 90  70 - 99 mg/dL   BUN 39 (*) 6 - 23 mg/dL   Creatinine, Ser 1.19 (*) 0.50 - 1.35 mg/dL   Calcium 8.3 (*) 8.4 - 10.5 mg/dL   Total Protein 5.3 (*) 6.0 - 8.3  g/dL   Albumin 2.9 (*) 3.5 - 5.2 g/dL   AST 13  0 - 37 U/L   ALT 10  0 - 53 U/L   Alkaline Phosphatase 43  39 - 117 U/L   Total Bilirubin 0.5  0.3 - 1.2 mg/dL   GFR calc non Af Amer 30 (*) >90 mL/min   GFR calc Af Amer 34 (*) >90 mL/min   Comment: (NOTE)     The eGFR has been calculated using the CKD EPI equation.     This calculation has not been validated in all clinical situations.     eGFR's persistently <90 mL/min signify possible Chronic Kidney     Disease.  CBC     Status: Abnormal   Collection Time    03/25/13  5:04 AM      Result Value Range   WBC 8.0  4.0 - 10.5 K/uL   RBC 2.94 (*) 4.22 - 5.81 MIL/uL   Hemoglobin 8.1 (*) 13.0 - 17.0 g/dL   HCT 16.1 (*) 09.6 - 04.5 %   MCV 85.0  78.0 - 100.0 fL   MCH 27.6  26.0 - 34.0 pg   MCHC 32.4  30.0 - 36.0 g/dL   RDW 40.9  81.1 - 91.4 %   Platelets 173  150 - 400 K/uL   Comment: DELTA CHECK NOTED     SPECIMEN CHECKED FOR  CLOTS     REPEATED TO VERIFY    Studies/Results: Dg Abd Acute W/chest  03/24/2013   CLINICAL DATA:  Anemia, shortness of breath, coronary disease  EXAM: ACUTE ABDOMEN SERIES (ABDOMEN 2 VIEW & CHEST 1 VIEW)  COMPARISON:  08/15/2012  FINDINGS: Prior coronary bypass changes noted. Mild cardiomegaly without CHF or pneumonia. Large hiatal hernia evident. No effusion or pneumothorax. Trachea midline.  No free air evident. Scattered air and stool throughout the bowel. No dilatation or obstruction pattern. Negative for ileus. Prior cholecystectomy noted. Degenerative changes of the spine, pelvis and hips. Pelvic calcifications consistent with venous phleboliths. Bones are osteopenic.  IMPRESSION: Cardiomegaly without CHF  Large hiatal hernia  Negative for obstruction or free air   Electronically Signed   By: Ruel Favors M.D.   On: 03/24/2013 19:44   Medications: Scheduled Meds: . antiseptic oral rinse  15 mL Mouth Rinse BID  . [START ON 03/28/2013] pantoprazole (PROTONIX) IV  40 mg Intravenous Q12H  . sodium chloride  3 mL Intravenous Q12H   Continuous Infusions: . sodium chloride 75 mL/hr at 03/25/13 0321  . sodium chloride    . pantoprozole (PROTONIX) infusion 8 mg/hr (03/25/13 0407)   PRN Meds:.acetaminophen, acetaminophen, ondansetron (ZOFRAN) IV, ondansetron  Assessment/Plan: Principal Problem:   GI bleed - agree that he likely has upper GI bleeding from aspirin and NSAID use.  Upper DOS 3 scheduled today per Dr. Lance Coon Active Problems:   CAD - fortunately no anginal symptoms but marked decrease in exercise tolerance   HIATAL HERNIA - continue PPI therapy and refrain from NSAID use at all times   Hypertension - controlled   Depression - controlled   GERD (gastroesophageal reflux disease)   Anemia - transfuse for hemoglobin less than 8 because of coronary artery disease   CKD (chronic kidney disease)  Disposition:  Hopefully discharge home tomorrow on Friday.  Full code    LOS: 1  day   Pearla Dubonnet, MD 03/25/2013, 7:31 AM

## 2013-03-26 DIAGNOSIS — F329 Major depressive disorder, single episode, unspecified: Secondary | ICD-10-CM | POA: Diagnosis not present

## 2013-03-26 DIAGNOSIS — I2510 Atherosclerotic heart disease of native coronary artery without angina pectoris: Secondary | ICD-10-CM | POA: Diagnosis not present

## 2013-03-26 DIAGNOSIS — I10 Essential (primary) hypertension: Secondary | ICD-10-CM | POA: Diagnosis not present

## 2013-03-26 DIAGNOSIS — D649 Anemia, unspecified: Secondary | ICD-10-CM | POA: Diagnosis not present

## 2013-03-26 DIAGNOSIS — K922 Gastrointestinal hemorrhage, unspecified: Secondary | ICD-10-CM | POA: Diagnosis not present

## 2013-03-26 LAB — CBC
Hemoglobin: 8.6 g/dL — ABNORMAL LOW (ref 13.0–17.0)
MCH: 28 pg (ref 26.0–34.0)
MCHC: 32.6 g/dL (ref 30.0–36.0)
MCV: 86 fL (ref 78.0–100.0)
RBC: 3.07 MIL/uL — ABNORMAL LOW (ref 4.22–5.81)

## 2013-03-26 LAB — BASIC METABOLIC PANEL
BUN: 31 mg/dL — ABNORMAL HIGH (ref 6–23)
CO2: 19 mEq/L (ref 19–32)
Calcium: 8.4 mg/dL (ref 8.4–10.5)
Creatinine, Ser: 1.84 mg/dL — ABNORMAL HIGH (ref 0.50–1.35)
GFR calc non Af Amer: 31 mL/min — ABNORMAL LOW (ref >90)
Glucose, Bld: 85 mg/dL (ref 70–99)
Sodium: 142 mEq/L (ref 135–145)

## 2013-03-26 LAB — GLUCOSE, CAPILLARY

## 2013-03-26 MED ORDER — ESOMEPRAZOLE MAGNESIUM 40 MG PO PACK: 40.0000 mg | PACK | Freq: Two times a day (BID) | ORAL | Status: DC

## 2013-03-26 NOTE — Discharge Summary (Addendum)
Physician Discharge Summary  Patient ID: Scott [name] MRN: [id number] DOB/AGE: [birth year] 77 y.o.  Admit date: 03/24/2013 Discharge date: 03/26/2013  Admission Diagnoses: Upper GI bleed Coronary artery disease Hypertension Depression Gastroesophageal reflux disease Anemia, iron deficiency Chronic kidney disease stage III  Discharge Diagnoses:  Principal Problem:  Upper GI bleed Active Problems:   CAD   HIATAL HERNIA Possible duodenal ulcer Gastric erosions   Hypertension   Depression   GERD (gastroesophageal reflux disease)   Anemia   CKD (chronic kidney disease)   Discharged Condition: good  Hospital Course: The patient was admitted on November 5 complaining of several days of black stools and fatigue. The patient presented to his primary care physician's office and hemoglobin was found to be 7. He had one episode of presyncope and one episode of nausea and vomiting but no abdominal pain. He had been taking naproxen for several days for headaches along with his normal daily aspirin. The patient was admitted for an upper GI bleed. He was placed on IV Protonix. He was seen by gastroenterology and underwent an upper endoscopy on November 6 by Dr. Dorena Cookey which showed a large hernia with possible paraesophageal component, catheterizations, duodenitis and cannot rule out duodenal ulcer at junction of first and second portion of duodenum. The patient was given 2 units of PRBCs. His hemoglobin stabilized in the 8 range, 8.6 at discharge. CLOtest was pending. The patient's diet was advanced and he tolerated this. Aspirin and naproxen were discontinued. The patient's PPI was doubled at discharge.  CLOtest was pending at discharge  Consults: GI  Significant Diagnostic Studies: labs: Hemoglobin 8.6, BUN 31 creatinine 1.84, radiology: KUB: Cardiomegaly, large hiatal hernia and endoscopy: gastroscopy: As above  Treatments: IV hydration transfusion 2 units PRBCs  Discharge  Exam: Blood pressure 141/63, pulse 61, temperature 98.6 F (37 C), temperature source Oral, resp. rate 18, height 5\' 7"  (1.702 m), weight 66.7 kg (147 lb 0.8 oz), SpO2 98.00%. Resp: clear to auscultation bilaterally Heart regular rate and rhythm without murmur gallop or rub GI: soft, non-tender; bowel sounds normal; no masses,  no organomegaly  Disposition: 01-Home or Self Care   Future Appointments Provider Department Dept Phone   05/27/2013 9:00 AM Everette Rank, MD [facility] [phone]       Medication List    STOP taking these medications       aspirin 81 MG chewable tablet     naproxen sodium 220 MG tablet  Commonly known as:  ANAPROX      TAKE these medications       acetaminophen 500 MG tablet  Commonly known as:  TYLENOL  Take 1,000 mg by mouth every 6 (six) hours as needed for headache.     buPROPion 150 MG 24 hr tablet  Commonly known as:  WELLBUTRIN XL  Take 75 mg by mouth daily.     esomeprazole 40 MG packet  Commonly known as:  NEXIUM  Take 40 mg by mouth 2 (two) times daily.     isosorbide mononitrate 30 MG 24 hr tablet  Commonly known as:  IMDUR  Take 30 mg by mouth daily.     KP FERROUS SULFATE 325 (65 FE) MG tablet  Generic drug:  ferrous sulfate  Take 325 mg by mouth daily.     lisinopril 10 MG tablet  Commonly known as:  PRINIVIL,ZESTRIL  Take 0.5 tablets (5 mg total) by mouth daily.     metoprolol succinate 25 MG 24 hr tablet  Commonly known as:  TOPROL-XL  Take 25 mg by mouth every evening.     PRISTIQ 50 MG 24 hr tablet  Generic drug:  desvenlafaxine  Take 25 mg by mouth every evening.     rosuvastatin 10 MG tablet  Commonly known as:  CRESTOR  Take 5 mg by mouth every evening.     vitamin B-12 1000 MCG tablet  Commonly known as:  CYANOCOBALAMIN  Take 1,000 mcg by mouth every evening.           Follow-up Information   Follow up In 10 days.      Follow up with Pearla Dubonnet, MD.   Specialty:   Internal Medicine   Contact information:   [address] [place] [address] [phone]       Signed: Lillia Mountain 03/26/2013, 7:37 AM

## 2013-03-26 NOTE — Care Management Note (Signed)
    Page 1 of 1   03/26/2013     1:49:49 PM   CARE MANAGEMENT NOTE 03/26/2013  Patient:  Scott [name], Scott [name]   Account Number:  [id number]  Date Initiated:  03/26/2013  Documentation initiated by:  Letha Cape  Subjective/Objective Assessment:   dx gib  admit-lives alone.     Action/Plan:   Anticipated DC Date:  03/26/2013   Anticipated DC Plan:  HOME/SELF CARE      DC Planning Services  CM consult      Choice offered to / List presented to:             Status of service:  Completed, signed off Medicare Important Message given?   (If response is "NO", the following Medicare IM given date fields will be blank) Date Medicare IM given:   Date Additional Medicare IM given:    Discharge Disposition:  HOME/SELF CARE  Per UR Regulation:  Reviewed for med. necessity/level of care/duration of stay  If discussed at Long Length of Stay Meetings, dates discussed:    Comments:  03/26/13 13:48 Letha Cape RN,BSN [phone] pt for dc to home no needs.

## 2013-03-26 NOTE — Progress Notes (Signed)
03/26/13 Patient being discharged toady. IV site removed, discharge instructions reviewed with patient.

## 2013-03-27 LAB — CLOTEST (H. PYLORI), BIOPSY: Helicobacter screen: NEGATIVE

## 2013-03-29 ENCOUNTER — Encounter (HOSPITAL_COMMUNITY): Payer: Self-pay | Admitting: Gastroenterology

## 2013-05-04 DIAGNOSIS — D649 Anemia, unspecified: Secondary | ICD-10-CM | POA: Diagnosis not present

## 2013-05-04 DIAGNOSIS — K922 Gastrointestinal hemorrhage, unspecified: Secondary | ICD-10-CM | POA: Diagnosis not present

## 2013-05-04 DIAGNOSIS — I2510 Atherosclerotic heart disease of native coronary artery without angina pectoris: Secondary | ICD-10-CM | POA: Diagnosis not present

## 2013-05-04 DIAGNOSIS — I10 Essential (primary) hypertension: Secondary | ICD-10-CM | POA: Diagnosis not present

## 2013-05-04 DIAGNOSIS — R195 Other fecal abnormalities: Secondary | ICD-10-CM | POA: Diagnosis not present

## 2013-05-04 DIAGNOSIS — K3189 Other diseases of stomach and duodenum: Secondary | ICD-10-CM | POA: Diagnosis not present

## 2013-05-27 ENCOUNTER — Ambulatory Visit (INDEPENDENT_AMBULATORY_CARE_PROVIDER_SITE_OTHER): Payer: Medicare Other | Admitting: Interventional Cardiology

## 2013-05-27 ENCOUNTER — Encounter: Payer: Self-pay | Admitting: Interventional Cardiology

## 2013-05-27 VITALS — BP 108/70 | HR 58 | Ht 66.5 in | Wt 153.0 lb

## 2013-05-27 DIAGNOSIS — D649 Anemia, unspecified: Secondary | ICD-10-CM | POA: Diagnosis not present

## 2013-05-27 DIAGNOSIS — I2510 Atherosclerotic heart disease of native coronary artery without angina pectoris: Secondary | ICD-10-CM | POA: Diagnosis not present

## 2013-05-27 DIAGNOSIS — E785 Hyperlipidemia, unspecified: Secondary | ICD-10-CM

## 2013-05-27 DIAGNOSIS — I10 Essential (primary) hypertension: Secondary | ICD-10-CM

## 2013-05-27 NOTE — Progress Notes (Signed)
Patient ID: Scott [name], male   DOB: 1924/02/11, 80 y.o.   MRN: [id number]    [place] [place], [place]  [address] Phone: [phone] Fax:  [phone]  Date:  05/27/2013   ID:  ROCIO [name], DOB Jan 15, 1924, MRN [id number]  PCP:  Henrine Screws, MD      History of Present Illness: [name] is a 80 y.o. male who has had CAD in the past. He had a cath several years ago showing patent grafts. Cough has improved. No further nausea or depression. He feels his memory is decreased. His exercise tolerance has decreased also over the past few months.    Leg pains have improved with walking on regular ground.  No further leg pains like he had with walking on the treadmill.  CAD/ASCVD:  leg pain. c/o Dyspnea on exertion mild- improved Denies : Chest pain.  Dizziness.  Leg edema.   He was found to be anemic a few monhs ago (Hbg down to 6).  He received 2 units of blood.  He has been of of aspirin.  Hewas taking aleve.  He is now avoiding NSAIDs.  Wt Readings from Last 3 Encounters:  05/27/13 153 lb (69.4 kg)  03/26/13 147 lb 0.8 oz (66.7 kg)  03/26/13 147 lb 0.8 oz (66.7 kg)     Past Medical History  Diagnosis Date  . Hypertension   . Anxiety   . Depression   . GERD (gastroesophageal reflux disease)   . Hyperlipidemia   . CAD (coronary artery disease)     with CABG in 1983  . Iron deficiency anemia   . Erectile dysfunction   . Vasovagal syncope 05/2006  . Hx of adenomatous colonic polyps 2007  . Sigmoid diverticulosis   . Weight loss   . Urethral stricture 11/2008  . Shingles 1990's  . Inguinal hernia     "unrepaired one in my right side now; doesn't bother me" (03/24/2013)  . Anginal pain ~ 1992    "before by bypass" (03/24/2013)  . Claudication     "BLE" (03/24/2013)  . Dyspnea 2010    syndrome - extensive  . Exertional shortness of breath   . History of blood transfusion     "suppose to get a 2nd bag of blood tonight; this is my  first blood transfusion" (03/24/2013)  . H/O hiatal hernia     Current Outpatient Prescriptions  Medication Sig Dispense Refill  . buPROPion (WELLBUTRIN XL) 150 MG 24 hr tablet Take 75 mg by mouth daily.       [name] desvenlafaxine (PRISTIQ) 50 MG 24 hr tablet Take 25 mg by mouth every evening.       [name] esomeprazole (NEXIUM) 40 MG packet Take 40 mg by mouth 2 (two) times daily.  30 each  12  . ferrous sulfate (KP FERROUS SULFATE) 325 (65 FE) MG tablet Take 325 mg by mouth daily.      . isosorbide mononitrate (IMDUR) 30 MG 24 hr tablet Take 30 mg by mouth daily.      [name] lisinopril (PRINIVIL,ZESTRIL) 10 MG tablet Take 0.5 tablets (5 mg total) by mouth daily.  30 tablet  0  . metoprolol succinate (TOPROL-XL) 25 MG 24 hr tablet Take 25 mg by mouth every evening.       . rosuvastatin (CRESTOR) 10 MG tablet Take 5 mg by mouth every evening.       . vitamin B-12 (CYANOCOBALAMIN) 1000 MCG tablet Take 1,000 mcg  by mouth every evening.        No current facility-administered medications for this visit.    Allergies:   Not on File  Social History:  The patient  reports that he has never smoked. He has never used smokeless tobacco. He reports that he drinks about 4.2 ounces of alcohol per week. He reports that he does not use illicit drugs.   Family History:  The patient's family history includes Heart failure in his brother.   ROS:  Please see the history of present illness.  No nausea, vomiting.  No fevers, chills.  No focal weakness.  No dysuria.    All other systems reviewed and negative.   PHYSICAL EXAM: VS:  BP 108/70  Pulse 58  Ht 5' 6.5" (1.689 m)  Wt 153 lb (69.4 kg)  BMI 24.33 kg/m2 Well nourished, well developed, in no acute distress HEENT: normal Neck: no JVD, no carotid bruits Cardiac:  normal S1, S2; RRR;  Lungs:  clear to auscultation bilaterally, no wheezing, rhonchi or rales Abd: soft, nontender, no hepatomegaly Ext: no edema Skin: warm and dry Neuro:   no focal abnormalities  noted  EKG:  NSR, no ST segment changes  ASSESSMENT AND PLAN:  CAD in native artery  Continue Isosorbide Mononitrate Tablet Extended Release 24 Hour, 30 MG, 1 tablet, Orally, Once a day Continue Metoprolol Succinate Tablet Extended Release 24 Hour, SUC 25MG  Milligram, TAKE 1 TABLET ONCE A DAY Continue Aspirin Tablet Chewable, 81 MG, 1 tablet, Orally, every other day Notes: No angina.    2. Benign essential hypertension  Notes: Check BP outside the doctor's office twice a week. If consistently > 140/90, let us know. May increase lisinopril. Controlled today.    3. Pure hypercholesterolemia  Continue Crestor Tablet, 10 MG, half tablet, Orally, Once a day, Notes: taking 1/2 pill Notes: Last LDLs 113, then 109 in 2013. Target is less than 100.  Crestor 5 mg daily. He feels diet is good at home. LDL up to 174 in 10/14.  Unsure why level has increased.  He is confused about his dose.  i wonder if he is taking the correct dose.  WIll verify.     4. Others  Notes: Decreased exercise tolerance likely related in part to anemia. Better now with higher Hbg.    Follow Up     Signed, Mina Marble, MD, [facility] ([facility]) 05/27/2013 9:33 AM  2w3

## 2013-05-27 NOTE — Patient Instructions (Addendum)
Your physician wants you to follow-up in: 6 months with Dr. Irish Lack. You will receive a reminder letter in the mail two months in advance. If you don't receive a letter, please call our office to schedule the follow-up appointment.  Call me back with your dosage of Crestor. Call [phone] and ask for Zebbie Ace.

## 2013-09-07 ENCOUNTER — Ambulatory Visit
Admission: RE | Admit: 2013-09-07 | Discharge: 2013-09-07 | Disposition: A | Discharge status: Home / Self Care | Payer: Medicare Other | Source: Ambulatory Visit | Attending: Internal Medicine | Admitting: Internal Medicine

## 2013-09-07 ENCOUNTER — Other Ambulatory Visit: Payer: Self-pay | Admitting: Internal Medicine

## 2013-09-07 DIAGNOSIS — R0789 Other chest pain: Secondary | ICD-10-CM

## 2013-09-07 DIAGNOSIS — R071 Chest pain on breathing: Secondary | ICD-10-CM | POA: Diagnosis not present

## 2013-09-07 DIAGNOSIS — S298XXA Other specified injuries of thorax, initial encounter: Secondary | ICD-10-CM | POA: Diagnosis not present

## 2013-09-07 DIAGNOSIS — R079 Chest pain, unspecified: Secondary | ICD-10-CM | POA: Diagnosis not present

## 2013-09-15 ENCOUNTER — Other Ambulatory Visit: Payer: Self-pay | Admitting: Internal Medicine

## 2013-09-15 DIAGNOSIS — I2510 Atherosclerotic heart disease of native coronary artery without angina pectoris: Secondary | ICD-10-CM | POA: Diagnosis not present

## 2013-09-15 DIAGNOSIS — R1312 Dysphagia, oropharyngeal phase: Secondary | ICD-10-CM | POA: Diagnosis not present

## 2013-09-15 DIAGNOSIS — I10 Essential (primary) hypertension: Secondary | ICD-10-CM | POA: Diagnosis not present

## 2013-09-15 DIAGNOSIS — M25519 Pain in unspecified shoulder: Secondary | ICD-10-CM | POA: Diagnosis not present

## 2013-09-15 DIAGNOSIS — K449 Diaphragmatic hernia without obstruction or gangrene: Secondary | ICD-10-CM | POA: Diagnosis not present

## 2013-09-15 DIAGNOSIS — R1310 Dysphagia, unspecified: Secondary | ICD-10-CM

## 2013-09-15 DIAGNOSIS — E538 Deficiency of other specified B group vitamins: Secondary | ICD-10-CM | POA: Diagnosis not present

## 2013-09-17 ENCOUNTER — Ambulatory Visit
Admission: RE | Admit: 2013-09-17 | Discharge: 2013-09-17 | Disposition: A | Discharge status: Home / Self Care | Payer: Medicare Other | Source: Ambulatory Visit | Attending: Internal Medicine | Admitting: Internal Medicine

## 2013-09-17 DIAGNOSIS — R1310 Dysphagia, unspecified: Secondary | ICD-10-CM

## 2013-09-17 DIAGNOSIS — K449 Diaphragmatic hernia without obstruction or gangrene: Secondary | ICD-10-CM | POA: Diagnosis not present

## 2013-09-17 DIAGNOSIS — K2289 Other specified disease of esophagus: Secondary | ICD-10-CM | POA: Diagnosis not present

## 2013-09-17 DIAGNOSIS — K228 Other specified diseases of esophagus: Secondary | ICD-10-CM | POA: Diagnosis not present

## 2013-10-15 DIAGNOSIS — I10 Essential (primary) hypertension: Secondary | ICD-10-CM | POA: Diagnosis not present

## 2013-10-15 DIAGNOSIS — M79609 Pain in unspecified limb: Secondary | ICD-10-CM | POA: Diagnosis not present

## 2013-10-15 DIAGNOSIS — R1312 Dysphagia, oropharyngeal phase: Secondary | ICD-10-CM | POA: Diagnosis not present

## 2013-10-15 DIAGNOSIS — I2510 Atherosclerotic heart disease of native coronary artery without angina pectoris: Secondary | ICD-10-CM | POA: Diagnosis not present

## 2013-10-15 DIAGNOSIS — K449 Diaphragmatic hernia without obstruction or gangrene: Secondary | ICD-10-CM | POA: Diagnosis not present

## 2013-12-02 DIAGNOSIS — D509 Iron deficiency anemia, unspecified: Secondary | ICD-10-CM | POA: Diagnosis not present

## 2013-12-15 DIAGNOSIS — I2510 Atherosclerotic heart disease of native coronary artery without angina pectoris: Secondary | ICD-10-CM | POA: Diagnosis not present

## 2013-12-15 DIAGNOSIS — I10 Essential (primary) hypertension: Secondary | ICD-10-CM | POA: Diagnosis not present

## 2013-12-15 DIAGNOSIS — D509 Iron deficiency anemia, unspecified: Secondary | ICD-10-CM | POA: Diagnosis not present

## 2013-12-15 DIAGNOSIS — M79609 Pain in unspecified limb: Secondary | ICD-10-CM | POA: Diagnosis not present

## 2013-12-15 DIAGNOSIS — K449 Diaphragmatic hernia without obstruction or gangrene: Secondary | ICD-10-CM | POA: Diagnosis not present

## 2014-03-07 DIAGNOSIS — Z23 Encounter for immunization: Secondary | ICD-10-CM | POA: Diagnosis not present

## 2014-03-18 DIAGNOSIS — E538 Deficiency of other specified B group vitamins: Secondary | ICD-10-CM | POA: Diagnosis not present

## 2014-03-18 DIAGNOSIS — F329 Major depressive disorder, single episode, unspecified: Secondary | ICD-10-CM | POA: Diagnosis not present

## 2014-03-18 DIAGNOSIS — I10 Essential (primary) hypertension: Secondary | ICD-10-CM | POA: Diagnosis not present

## 2014-03-18 DIAGNOSIS — Z1389 Encounter for screening for other disorder: Secondary | ICD-10-CM | POA: Diagnosis not present

## 2014-03-18 DIAGNOSIS — E782 Mixed hyperlipidemia: Secondary | ICD-10-CM | POA: Diagnosis not present

## 2014-03-18 DIAGNOSIS — K449 Diaphragmatic hernia without obstruction or gangrene: Secondary | ICD-10-CM | POA: Diagnosis not present

## 2014-03-18 DIAGNOSIS — Z23 Encounter for immunization: Secondary | ICD-10-CM | POA: Diagnosis not present

## 2014-03-18 DIAGNOSIS — F419 Anxiety disorder, unspecified: Secondary | ICD-10-CM | POA: Diagnosis not present

## 2014-03-18 DIAGNOSIS — Z0000 Encounter for general adult medical examination without abnormal findings: Secondary | ICD-10-CM | POA: Diagnosis not present

## 2014-03-18 DIAGNOSIS — D509 Iron deficiency anemia, unspecified: Secondary | ICD-10-CM | POA: Diagnosis not present

## 2014-03-18 DIAGNOSIS — E559 Vitamin D deficiency, unspecified: Secondary | ICD-10-CM | POA: Diagnosis not present

## 2014-05-31 DIAGNOSIS — M79672 Pain in left foot: Secondary | ICD-10-CM | POA: Diagnosis not present

## 2014-05-31 DIAGNOSIS — L84 Corns and callosities: Secondary | ICD-10-CM | POA: Diagnosis not present

## 2014-05-31 DIAGNOSIS — M79671 Pain in right foot: Secondary | ICD-10-CM | POA: Diagnosis not present

## 2014-05-31 DIAGNOSIS — B351 Tinea unguium: Secondary | ICD-10-CM | POA: Diagnosis not present

## 2014-06-02 DIAGNOSIS — D509 Iron deficiency anemia, unspecified: Secondary | ICD-10-CM | POA: Diagnosis not present

## 2014-06-02 DIAGNOSIS — R498 Other voice and resonance disorders: Secondary | ICD-10-CM | POA: Diagnosis not present

## 2014-06-02 DIAGNOSIS — R062 Wheezing: Secondary | ICD-10-CM | POA: Diagnosis not present

## 2014-06-02 DIAGNOSIS — M79646 Pain in unspecified finger(s): Secondary | ICD-10-CM | POA: Diagnosis not present

## 2014-06-16 DIAGNOSIS — R498 Other voice and resonance disorders: Secondary | ICD-10-CM | POA: Diagnosis not present

## 2014-06-16 DIAGNOSIS — M79646 Pain in unspecified finger(s): Secondary | ICD-10-CM | POA: Diagnosis not present

## 2014-06-16 DIAGNOSIS — R062 Wheezing: Secondary | ICD-10-CM | POA: Diagnosis not present

## 2014-06-16 DIAGNOSIS — R1310 Dysphagia, unspecified: Secondary | ICD-10-CM | POA: Diagnosis not present

## 2014-06-16 DIAGNOSIS — D509 Iron deficiency anemia, unspecified: Secondary | ICD-10-CM | POA: Diagnosis not present

## 2014-06-24 ENCOUNTER — Other Ambulatory Visit (HOSPITAL_COMMUNITY): Payer: Self-pay | Admitting: Internal Medicine

## 2014-06-24 DIAGNOSIS — R1310 Dysphagia, unspecified: Secondary | ICD-10-CM

## 2014-07-01 ENCOUNTER — Ambulatory Visit (HOSPITAL_COMMUNITY)
Admission: RE | Admit: 2014-07-01 | Discharge: 2014-07-01 | Disposition: A | Discharge status: Home / Self Care | Payer: Medicare Other | Source: Ambulatory Visit | Attending: Internal Medicine | Admitting: Internal Medicine

## 2014-07-01 DIAGNOSIS — I10 Essential (primary) hypertension: Secondary | ICD-10-CM | POA: Insufficient documentation

## 2014-07-01 DIAGNOSIS — R1319 Other dysphagia: Secondary | ICD-10-CM | POA: Insufficient documentation

## 2014-07-01 DIAGNOSIS — K449 Diaphragmatic hernia without obstruction or gangrene: Secondary | ICD-10-CM | POA: Insufficient documentation

## 2014-07-01 DIAGNOSIS — K219 Gastro-esophageal reflux disease without esophagitis: Secondary | ICD-10-CM | POA: Diagnosis not present

## 2014-07-01 DIAGNOSIS — I2510 Atherosclerotic heart disease of native coronary artery without angina pectoris: Secondary | ICD-10-CM | POA: Insufficient documentation

## 2014-07-01 DIAGNOSIS — K224 Dyskinesia of esophagus: Secondary | ICD-10-CM | POA: Diagnosis not present

## 2014-07-01 DIAGNOSIS — R1310 Dysphagia, unspecified: Secondary | ICD-10-CM | POA: Diagnosis present

## 2014-07-01 NOTE — Procedures (Signed)
Objective Swallowing Evaluation: Modified Barium Swallowing Study  Patient Details  Name: Scott [name] MRN: [id number] Date of Birth: 10/11/1923  Today's Date: 07/01/2014 Time: SLP Start Time (ACUTE ONLY): 1110-SLP Stop Time (ACUTE ONLY): 1140 SLP Time Calculation (min) (ACUTE ONLY): 30 min  Past Medical History:  Past Medical History  Diagnosis Date  . Hypertension   . Anxiety   . Depression   . GERD (gastroesophageal reflux disease)   . Hyperlipidemia   . CAD (coronary artery disease)     with CABG in 1983  . Iron deficiency anemia   . Erectile dysfunction   . Vasovagal syncope 05/2006  . Hx of adenomatous colonic polyps 2007  . Sigmoid diverticulosis   . Weight loss   . Urethral stricture 11/2008  . Shingles 1990's  . Inguinal hernia     "unrepaired one in my right side now; doesn't bother me" (03/24/2013)  . Anginal pain ~ 1992    "before by bypass" (03/24/2013)  . Claudication     "BLE" (03/24/2013)  . Dyspnea 2010    syndrome - extensive  . Exertional shortness of breath   . History of blood transfusion     "suppose to get a 2nd bag of blood tonight; this is my first blood transfusion" (03/24/2013)  . H/O hiatal hernia    Past Surgical History:  Past Surgical History  Procedure Laterality Date  . Cholecystectomy  1980's  . Coronary artery bypass graft  ~ 1992    "CABG X4" (03/24/2013)  . Inguinal hernia repair Left   . Esophagogastroduodenoscopy N/A 03/25/2013    Procedure: ESOPHAGOGASTRODUODENOSCOPY (EGD);  Surgeon: Missy Sabins, MD;  Location: [facility] ENDOSCOPY;  Service: Endoscopy;  Laterality: N/A;   HPI:  HPI: 81 yr old seen for outpatient MBS accompanied by his wife. Pt complains of coughing during 2/3 of meals, tightening of throat and sensation of a "ledge" in his throat. PMH: GERD, large hiatal hernia, diaphragmatic hernia without obstruction, dpression/anxiety, essential HTN, bronchitis, hearing loss.  Barium esophagram 09/2013 revealed aspiration of barium,  slight narrowing if distal esophagus, esophageal dysmotility and large hiatal hernia.   No Data Recorded  Assessment / Plan / Recommendation CHL IP CLINICAL IMPRESSIONS 07/01/2014  Dysphagia Diagnosis Mild cervical esophageal phase dysphagia  Clinical impression Pt exhibits mild cervical esophageal dysphagia as evidenced by a prominent cricopharyngeal bar resulting in delayed and/oror incomplete opening of cricopharyngeus muscle without residue above the UES. Laryngeal penetration observed to be flash only (barium enters vestubule and is ejected during the swallow). Mild and intermittent pyriform sinus residue. Esophageal sweep (radiologist present) did not reveal overt abnormalities. Pt symptoms during meals appears to be primarily result of previously diagnosed esophageal dysmotility (esophagram 09/2013) exacerbated by cricopharyngeal deficits. Recommend regular consistency and thin liquids. SLP educated pt on results and recommendations for esophageal precautions (stay upright minimum 1 hour after meals, attempt warm liquids, alternate liquids/solids, eat smaller more frequent meals during the day etc). Advised pt if symptoms worsen he may consider GI consult.         CHL IP TREATMENT RECOMMENDATION 07/01/2014  Treatment Plan Recommendations No treatment recommended at this time     CHL IP DIET RECOMMENDATION 07/01/2014  Diet Recommendations Regular;Thin liquid  Liquid Administration via Cup;Straw  Medication Administration Whole meds with liquid  Compensations Slow rate;Small sips/bites;Follow solids with liquid  Postural Changes and/or Swallow Maneuvers Seated upright 90 degrees;Upright 30-60 min after meal     CHL IP OTHER RECOMMENDATIONS 07/01/2014  Recommended Consults (None)  Oral Care Recommendations Oral care BID  Other Recommendations (None)     CHL IP FOLLOW UP RECOMMENDATIONS 07/01/2014  Follow up Recommendations None     No flowsheet data found.   Pertinent Vitals/Pain No pain     SLP Swallow Goals No flowsheet data found.  No flowsheet data found.    CHL IP REASON FOR REFERRAL 07/01/2014  Reason for Referral Objectively evaluate swallowing function     CHL IP ORAL PHASE 07/01/2014  Lips (None)  Tongue (None)  Mucous membranes (None)  Nutritional status (None)  Other (None)  Oxygen therapy (None)  Oral Phase WFL  Oral - Pudding Teaspoon (None)  Oral - Pudding Cup (None)  Oral - Honey Teaspoon (None)  Oral - Honey Cup (None)  Oral - Honey Syringe (None)  Oral - Nectar Teaspoon (None)  Oral - Nectar Cup (None)  Oral - Nectar Straw (None)  Oral - Nectar Syringe (None)  Oral - Ice Chips (None)  Oral - Thin Teaspoon (None)  Oral - Thin Cup (None)  Oral - Thin Straw (None)  Oral - Thin Syringe (None)  Oral - Puree (None)  Oral - Mechanical Soft (None)  Oral - Regular (None)  Oral - Multi-consistency (None)  Oral - Pill (None)  Oral Phase - Comment (None)      CHL IP PHARYNGEAL PHASE 07/01/2014  Pharyngeal Phase Impaired  Pharyngeal - Pudding Teaspoon (None)  Penetration/Aspiration details (pudding teaspoon) (None)  Pharyngeal - Pudding Cup (None)  Penetration/Aspiration details (pudding cup) (None)  Pharyngeal - Honey Teaspoon (None)  Penetration/Aspiration details (honey teaspoon) (None)  Pharyngeal - Honey Cup (None)  Penetration/Aspiration details (honey cup) (None)  Pharyngeal - Honey Syringe (None)  Penetration/Aspiration details (honey syringe) (None)  Pharyngeal - Nectar Teaspoon (None)  Penetration/Aspiration details (nectar teaspoon) (None)  Pharyngeal - Nectar Cup (None)  Penetration/Aspiration details (nectar cup) (None)  Pharyngeal - Nectar Straw (None)  Penetration/Aspiration details (nectar straw) (None)  Pharyngeal - Nectar Syringe (None)  Penetration/Aspiration details (nectar syringe) (None)  Pharyngeal - Ice Chips (None)  Penetration/Aspiration details (ice chips) (None)  Pharyngeal - Thin Teaspoon (None)   Penetration/Aspiration details (thin teaspoon) (None)  Pharyngeal - Thin Cup Pharyngeal residue - pyriform sinuses;Reduced tongue base retraction;Penetration/Aspiration during swallow;Reduced airway/laryngeal closure  Penetration/Aspiration details (thin cup) Material enters airway, remains ABOVE vocal cords then ejected out  Pharyngeal - Thin Straw Pharyngeal residue - valleculae;Reduced tongue base retraction  Penetration/Aspiration details (thin straw) (None)  Pharyngeal - Thin Syringe (None)  Penetration/Aspiration details (thin syringe') (None)  Pharyngeal - Puree (None)  Penetration/Aspiration details (puree) (None)  Pharyngeal - Mechanical Soft (None)  Penetration/Aspiration details (mechanical soft) (None)  Pharyngeal - Regular WFL  Penetration/Aspiration details (regular) (None)  Pharyngeal - Multi-consistency (None)  Penetration/Aspiration details (multi-consistency) (None)  Pharyngeal - Pill WFL;Penetration/Aspiration during swallow  Penetration/Aspiration details (pill) Material enters airway, remains ABOVE vocal cords then ejected out  Pharyngeal Comment (None)     CHL IP CERVICAL ESOPHAGEAL PHASE 07/01/2014  Cervical Esophageal Phase Impaired  Pudding Teaspoon (None)  Pudding Cup (None)  Honey Teaspoon (None)  Honey Cup (None)  Honey Syringe (None)  Nectar Teaspoon (None)  Nectar Cup (None)  Nectar Straw (None)  Nectar Syringe (None)  Thin Teaspoon (None)  Thin Cup (None)  Thin Straw (None)  Thin Syringe (None)  Cervical Esophageal Comment (No Data)    CHL IP GO 07/01/2014  Functional Assessment Tool Used (No Data)  Functional Limitations Swallowing  Swallow Current Status (I7124) CI  Swallow Goal Status (P8099)  CI  Swallow Discharge Status [phone]) CI  Motor Speech Current Status [phone]) (None)  Motor Speech Goal Status [phone]) (None)  Motor Speech Goal Status [phone]) (None)  Spoken Language Comprehension Current Status [phone]) (None)  Spoken Language  Comprehension Goal Status (B0488) (None)  Spoken Language Comprehension Discharge Status [phone]) (None)  Spoken Language Expression Current Status [phone]) (None)  Spoken Language Expression Goal Status [phone]) (None)  Spoken Language Expression Discharge Status [phone]) (None)  Attention Current Status (P9150) (None)  Attention Goal Status (V6979) (None)  Attention Discharge Status [phone]) (None)  Memory Current Status (P5374) (None)  Memory Goal Status (M2707) (None)  Memory Discharge Status (E6754) (None)  Voice Current Status (G9201) (None)  Voice Goal Status (E0712) (None)  Voice Discharge Status (R9758) (None)  Other Speech-Language Pathology Functional Limitation [phone]) (None)  Other Speech-Language Pathology Functional Limitation Goal Status (D8264) (None)  Other Speech-Language Pathology Functional Limitation Discharge Status [phone]) (None)           Houston Siren 07/01/2014, 3:19 PM   Orbie Pyo Leonarda Leis M.Ed [employer] [phone]

## 2014-07-05 ENCOUNTER — Ambulatory Visit: Payer: Medicare Other

## 2014-07-15 DIAGNOSIS — R1310 Dysphagia, unspecified: Secondary | ICD-10-CM | POA: Diagnosis not present

## 2014-07-15 DIAGNOSIS — M79646 Pain in unspecified finger(s): Secondary | ICD-10-CM | POA: Diagnosis not present

## 2014-07-15 DIAGNOSIS — R498 Other voice and resonance disorders: Secondary | ICD-10-CM | POA: Diagnosis not present

## 2014-07-15 DIAGNOSIS — D509 Iron deficiency anemia, unspecified: Secondary | ICD-10-CM | POA: Diagnosis not present

## 2014-07-15 DIAGNOSIS — R062 Wheezing: Secondary | ICD-10-CM | POA: Diagnosis not present

## 2014-07-15 DIAGNOSIS — L570 Actinic keratosis: Secondary | ICD-10-CM | POA: Diagnosis not present

## 2014-09-16 DIAGNOSIS — R1310 Dysphagia, unspecified: Secondary | ICD-10-CM | POA: Diagnosis not present

## 2014-09-16 DIAGNOSIS — D509 Iron deficiency anemia, unspecified: Secondary | ICD-10-CM | POA: Diagnosis not present

## 2014-09-16 DIAGNOSIS — R062 Wheezing: Secondary | ICD-10-CM | POA: Diagnosis not present

## 2014-09-16 DIAGNOSIS — L570 Actinic keratosis: Secondary | ICD-10-CM | POA: Diagnosis not present

## 2014-09-16 DIAGNOSIS — M79646 Pain in unspecified finger(s): Secondary | ICD-10-CM | POA: Diagnosis not present

## 2014-09-16 DIAGNOSIS — R498 Other voice and resonance disorders: Secondary | ICD-10-CM | POA: Diagnosis not present

## 2014-10-13 ENCOUNTER — Inpatient Hospital Stay (HOSPITAL_COMMUNITY)
Admission: EM | Admit: 2014-10-13 | Discharge: 2014-10-15 | DRG: 281 | Payer: Medicare Other | Attending: Cardiovascular Disease | Admitting: Cardiovascular Disease

## 2014-10-13 ENCOUNTER — Encounter (HOSPITAL_COMMUNITY): Payer: Self-pay | Admitting: Emergency Medicine

## 2014-10-13 ENCOUNTER — Emergency Department (HOSPITAL_COMMUNITY): Payer: Medicare Other

## 2014-10-13 DIAGNOSIS — D509 Iron deficiency anemia, unspecified: Secondary | ICD-10-CM | POA: Diagnosis present

## 2014-10-13 DIAGNOSIS — Z7982 Long term (current) use of aspirin: Secondary | ICD-10-CM

## 2014-10-13 DIAGNOSIS — I25810 Atherosclerosis of coronary artery bypass graft(s) without angina pectoris: Secondary | ICD-10-CM | POA: Diagnosis present

## 2014-10-13 DIAGNOSIS — I2510 Atherosclerotic heart disease of native coronary artery without angina pectoris: Secondary | ICD-10-CM | POA: Diagnosis not present

## 2014-10-13 DIAGNOSIS — F329 Major depressive disorder, single episode, unspecified: Secondary | ICD-10-CM | POA: Diagnosis present

## 2014-10-13 DIAGNOSIS — F419 Anxiety disorder, unspecified: Secondary | ICD-10-CM | POA: Diagnosis present

## 2014-10-13 DIAGNOSIS — N184 Chronic kidney disease, stage 4 (severe): Secondary | ICD-10-CM | POA: Diagnosis present

## 2014-10-13 DIAGNOSIS — R001 Bradycardia, unspecified: Secondary | ICD-10-CM | POA: Diagnosis not present

## 2014-10-13 DIAGNOSIS — Z86010 Personal history of colonic polyps: Secondary | ICD-10-CM | POA: Diagnosis not present

## 2014-10-13 DIAGNOSIS — R778 Other specified abnormalities of plasma proteins: Secondary | ICD-10-CM | POA: Diagnosis present

## 2014-10-13 DIAGNOSIS — I10 Essential (primary) hypertension: Secondary | ICD-10-CM | POA: Diagnosis present

## 2014-10-13 DIAGNOSIS — K449 Diaphragmatic hernia without obstruction or gangrene: Secondary | ICD-10-CM | POA: Diagnosis present

## 2014-10-13 DIAGNOSIS — I209 Angina pectoris, unspecified: Secondary | ICD-10-CM | POA: Diagnosis not present

## 2014-10-13 DIAGNOSIS — R7989 Other specified abnormal findings of blood chemistry: Secondary | ICD-10-CM | POA: Diagnosis not present

## 2014-10-13 DIAGNOSIS — E785 Hyperlipidemia, unspecified: Secondary | ICD-10-CM | POA: Diagnosis not present

## 2014-10-13 DIAGNOSIS — I129 Hypertensive chronic kidney disease with stage 1 through stage 4 chronic kidney disease, or unspecified chronic kidney disease: Secondary | ICD-10-CM | POA: Diagnosis present

## 2014-10-13 DIAGNOSIS — R079 Chest pain, unspecified: Secondary | ICD-10-CM | POA: Diagnosis not present

## 2014-10-13 DIAGNOSIS — I214 Non-ST elevation (NSTEMI) myocardial infarction: Secondary | ICD-10-CM | POA: Diagnosis not present

## 2014-10-13 DIAGNOSIS — R0789 Other chest pain: Secondary | ICD-10-CM

## 2014-10-13 DIAGNOSIS — Z951 Presence of aortocoronary bypass graft: Secondary | ICD-10-CM | POA: Diagnosis not present

## 2014-10-13 DIAGNOSIS — K219 Gastro-esophageal reflux disease without esophagitis: Secondary | ICD-10-CM | POA: Diagnosis present

## 2014-10-13 DIAGNOSIS — K5730 Diverticulosis of large intestine without perforation or abscess without bleeding: Secondary | ICD-10-CM | POA: Diagnosis present

## 2014-10-13 DIAGNOSIS — K4090 Unilateral inguinal hernia, without obstruction or gangrene, not specified as recurrent: Secondary | ICD-10-CM | POA: Diagnosis present

## 2014-10-13 HISTORY — DX: Gastrointestinal hemorrhage, unspecified: K92.2

## 2014-10-13 LAB — CBC WITH DIFFERENTIAL/PLATELET
BASOS ABS: 0 10*3/uL (ref 0.0–0.1)
Basophils Relative: 1 % (ref 0–1)
EOS PCT: 4 % (ref 0–5)
Eosinophils Absolute: 0.3 10*3/uL (ref 0.0–0.7)
HCT: 38.2 % — ABNORMAL LOW (ref 39.0–52.0)
Hemoglobin: 12.6 g/dL — ABNORMAL LOW (ref 13.0–17.0)
LYMPHS PCT: 21 % (ref 12–46)
Lymphs Abs: 1.3 10*3/uL (ref 0.7–4.0)
MCH: 29.6 pg (ref 26.0–34.0)
MCHC: 33 g/dL (ref 30.0–36.0)
MCV: 89.7 fL (ref 78.0–100.0)
MONO ABS: 0.5 10*3/uL (ref 0.1–1.0)
Monocytes Relative: 7 % (ref 3–12)
Neutro Abs: 4.4 10*3/uL (ref 1.7–7.7)
Neutrophils Relative %: 67 % (ref 43–77)
PLATELETS: 141 10*3/uL — AB (ref 150–400)
RBC: 4.26 MIL/uL (ref 4.22–5.81)
RDW: 14 % (ref 11.5–15.5)
WBC: 6.5 10*3/uL (ref 4.0–10.5)

## 2014-10-13 LAB — I-STAT TROPONIN, ED: Troponin i, poc: 0.13 ng/mL (ref 0.00–0.08)

## 2014-10-13 LAB — TROPONIN I
Troponin I: 0.25 ng/mL — ABNORMAL HIGH (ref <0.031)
Troponin I: 0.27 ng/mL — ABNORMAL HIGH (ref <0.031)

## 2014-10-13 LAB — BASIC METABOLIC PANEL
Anion gap: 8 (ref 5–15)
BUN: 34 mg/dL — AB (ref 6–20)
CALCIUM: 8.9 mg/dL (ref 8.9–10.3)
CHLORIDE: 110 mmol/L (ref 101–111)
CO2: 21 mmol/L — AB (ref 22–32)
CREATININE: 2.09 mg/dL — AB (ref 0.61–1.24)
GFR calc Af Amer: 31 mL/min — ABNORMAL LOW (ref >60)
GFR calc non Af Amer: 27 mL/min — ABNORMAL LOW (ref >60)
Glucose, Bld: 96 mg/dL (ref 65–99)
Potassium: 5 mmol/L (ref 3.5–5.1)
Sodium: 139 mmol/L (ref 135–145)

## 2014-10-13 LAB — HEPARIN LEVEL (UNFRACTIONATED): Heparin Unfractionated: 1 IU/mL — ABNORMAL HIGH (ref 0.30–0.70)

## 2014-10-13 LAB — PROTIME-INR
INR: 1.14 (ref 0.00–1.49)
Prothrombin Time: 14.8 seconds (ref 11.6–15.2)

## 2014-10-13 LAB — APTT: aPTT: >200 seconds (ref 24–37)

## 2014-10-13 LAB — TSH: TSH: 2.552 u[IU]/mL (ref 0.350–4.500)

## 2014-10-13 MED ORDER — ALPRAZOLAM 0.25 MG PO TABS: 0.2500 mg | ORAL_TABLET | Freq: Two times a day (BID) | ORAL | Status: DC | PRN

## 2014-10-13 MED ORDER — ASPIRIN 81 MG PO CHEW
81.0000 mg | CHEWABLE_TABLET | Freq: Every day | ORAL | Status: DC
  Administered 2014-10-13 – 2014-10-15 (×3): 81 mg via ORAL
  Filled 2014-10-13 (×4): qty 1

## 2014-10-13 MED ORDER — ASPIRIN 81 MG PO TABS: 81.0000 mg | ORAL_TABLET | Freq: Every day | ORAL | Status: DC

## 2014-10-13 MED ORDER — SODIUM CHLORIDE 0.9 % IV SOLN: 250.0000 mL | INTRAVENOUS | Status: DC | PRN

## 2014-10-13 MED ORDER — ONDANSETRON HCL 4 MG/2ML IJ SOLN: 4.0000 mg | Freq: Four times a day (QID) | INTRAMUSCULAR | Status: DC | PRN

## 2014-10-13 MED ORDER — VENLAFAXINE HCL ER 37.5 MG PO CP24
37.5000 mg | ORAL_CAPSULE | Freq: Every day | ORAL | Status: DC
  Administered 2014-10-14 – 2014-10-15 (×2): 37.5 mg via ORAL
  Filled 2014-10-13 (×3): qty 1

## 2014-10-13 MED ORDER — HEPARIN SODIUM (PORCINE) 5000 UNIT/ML IJ SOLN
4000.0000 [IU] | Freq: Once | INTRAMUSCULAR | Status: DC
  Filled 2014-10-13: qty 1

## 2014-10-13 MED ORDER — PNEUMOCOCCAL VAC POLYVALENT 25 MCG/0.5ML IJ INJ
0.5000 mL | INJECTION | INTRAMUSCULAR | Status: DC
  Filled 2014-10-13: qty 0.5

## 2014-10-13 MED ORDER — ROSUVASTATIN CALCIUM 10 MG PO TABS
5.0000 mg | ORAL_TABLET | Freq: Every evening | ORAL | Status: DC
  Administered 2014-10-13 – 2014-10-14 (×2): 5 mg via ORAL
  Filled 2014-10-13 (×3): qty 1

## 2014-10-13 MED ORDER — SODIUM CHLORIDE 0.9 % IV SOLN
INTRAVENOUS | Status: DC
  Administered 2014-10-13: 14:00:00 via INTRAVENOUS

## 2014-10-13 MED ORDER — SODIUM CHLORIDE 0.9 % IJ SOLN
3.0000 mL | Freq: Two times a day (BID) | INTRAMUSCULAR | Status: DC
  Administered 2014-10-14: 3 mL via INTRAVENOUS

## 2014-10-13 MED ORDER — VITAMIN B-12 1000 MCG PO TABS
1000.0000 ug | ORAL_TABLET | Freq: Every evening | ORAL | Status: DC
  Administered 2014-10-13 – 2014-10-14 (×2): 1000 ug via ORAL
  Filled 2014-10-13 (×2): qty 1

## 2014-10-13 MED ORDER — BUPROPION HCL 75 MG PO TABS
37.5000 mg | ORAL_TABLET | Freq: Two times a day (BID) | ORAL | Status: DC
  Administered 2014-10-13 – 2014-10-15 (×5): 37.5 mg via ORAL
  Filled 2014-10-13 (×7): qty 0.5

## 2014-10-13 MED ORDER — NITROGLYCERIN 0.4 MG SL SUBL: 0.4000 mg | SUBLINGUAL_TABLET | SUBLINGUAL | Status: DC | PRN

## 2014-10-13 MED ORDER — HEPARIN (PORCINE) IN NACL 100-0.45 UNIT/ML-% IJ SOLN: 12.0000 [IU]/kg/h | Freq: Once | INTRAMUSCULAR | Status: DC

## 2014-10-13 MED ORDER — ZOLPIDEM TARTRATE 5 MG PO TABS: 5.0000 mg | ORAL_TABLET | Freq: Every evening | ORAL | Status: DC | PRN

## 2014-10-13 MED ORDER — FERROUS SULFATE 325 (65 FE) MG PO TABS
325.0000 mg | ORAL_TABLET | Freq: Every day | ORAL | Status: DC
  Administered 2014-10-14 – 2014-10-15 (×2): 325 mg via ORAL
  Filled 2014-10-13 (×2): qty 1

## 2014-10-13 MED ORDER — HEPARIN BOLUS VIA INFUSION
4000.0000 [IU] | Freq: Once | INTRAVENOUS | Status: AC
  Administered 2014-10-13: 4000 [IU] via INTRAVENOUS
  Filled 2014-10-13: qty 4000

## 2014-10-13 MED ORDER — HEPARIN (PORCINE) IN NACL 100-0.45 UNIT/ML-% IJ SOLN: 600.0000 [IU]/h | INTRAMUSCULAR | Status: DC

## 2014-10-13 MED ORDER — ESOMEPRAZOLE MAGNESIUM 40 MG PO PACK: 40.0000 mg | PACK | Freq: Two times a day (BID) | ORAL | Status: DC

## 2014-10-13 MED ORDER — HEPARIN (PORCINE) IN NACL 100-0.45 UNIT/ML-% IJ SOLN
900.0000 [IU]/h | INTRAMUSCULAR | Status: DC
  Administered 2014-10-13: 900 [IU]/h via INTRAVENOUS
  Filled 2014-10-13: qty 250

## 2014-10-13 MED ORDER — METOPROLOL SUCCINATE ER 25 MG PO TB24
25.0000 mg | ORAL_TABLET | Freq: Every evening | ORAL | Status: DC
  Administered 2014-10-13: 25 mg via ORAL
  Filled 2014-10-13: qty 1

## 2014-10-13 MED ORDER — SODIUM CHLORIDE 0.9 % IJ SOLN: 3.0000 mL | INTRAMUSCULAR | Status: DC | PRN

## 2014-10-13 MED ORDER — NITROGLYCERIN 2 % TD OINT
0.5000 [in_us] | TOPICAL_OINTMENT | Freq: Four times a day (QID) | TRANSDERMAL | Status: DC
  Administered 2014-10-13 – 2014-10-14 (×3): 0.5 [in_us] via TOPICAL
  Filled 2014-10-13: qty 30

## 2014-10-13 MED ORDER — ESOMEPRAZOLE MAGNESIUM 40 MG PO CPDR
40.0000 mg | DELAYED_RELEASE_CAPSULE | Freq: Two times a day (BID) | ORAL | Status: DC
  Administered 2014-10-13 – 2014-10-15 (×4): 40 mg via ORAL
  Filled 2014-10-13 (×7): qty 1

## 2014-10-13 MED ORDER — ACETAMINOPHEN 325 MG PO TABS: 650.0000 mg | ORAL_TABLET | ORAL | Status: DC | PRN

## 2014-10-13 MED ORDER — LISINOPRIL 10 MG PO TABS
10.0000 mg | ORAL_TABLET | Freq: Every day | ORAL | Status: DC
  Administered 2014-10-13 – 2014-10-15 (×3): 10 mg via ORAL
  Filled 2014-10-13 (×3): qty 1

## 2014-10-13 NOTE — H&P (Signed)
History and Physical   Patient ID: Scott [name] MRN: [id number], DOB/AGE: 22-Jan-1924 81 y.o. Date of Encounter: 10/13/2014  Primary Physician: Henrine Screws, MD Primary Cardiologist: Dr. Beau Fanny  Chief Complaint:  Non-STEMI  HPI: Scott [name] is a 81 y.o. male with a history of CAD, CABG, subsequent non-STEMI, hypertension, hyperlipidemia and syncope. He ruled in for a non-STEMI in 2007 and grafts were patent at that time but there was distal disease in the circumflex for which medical therapy was recommended. His EF was 40-45 percent.  He has done well with medical management. Today, at rest at about 9 AM, he had sudden onset of substernal chest pain reached a 7/10. It radiated down both arms. He was a little diaphoretic but denies shortness of breath or nausea/vomiting. He told his wife who gave him on ibuprofen and call for help. He was given aspirin 81 mg 4 but the nurse. EMS responded and he received oxygen plus one sublingual nitroglycerin. He doesn't think the nitroglycerin helped but feels the oxygen made him feel better. In the hospital, he was started on a heparin drip. His pain gradually eased off and he is currently pain-free.  He has no other new ongoing medical issues. The last time he had this pain was when he ruled in for a small heart attack and medical therapy was recommended. He is currently resting comfortably.   Past Medical History  Diagnosis Date  . Hypertension   . Anxiety   . Depression   . GERD (gastroesophageal reflux disease)   . Hyperlipidemia   . CAD (coronary artery disease)     with CABG in 1983  . Iron deficiency anemia   . Erectile dysfunction   . Vasovagal syncope 05/2006  . Hx of adenomatous colonic polyps 2007  . Sigmoid diverticulosis   . Weight loss   . Urethral stricture 11/2008  . Shingles 1990's  . Inguinal hernia   . Anginal pain ~ 1992  . Claudication     BLE  . Dyspnea 2010    syndrome - extensive  .  Exertional shortness of breath   . History of blood transfusion 2014  . H/O hiatal hernia   . Upper GI bleeding 2014    Gastritis and possible duodenal ulcer seen on EGD, felt secondary to NSAIDs    Surgical History:  Past Surgical History  Procedure Laterality Date  . Cholecystectomy  1980's  . Coronary artery bypass graft  1994     LIMA-LAD, SVG-D1, SVG-OM,  SVG-RCA; performed in [place], [place]  . Inguinal hernia repair Left   . Esophagogastroduodenoscopy N/A 03/25/2013    Procedure: ESOPHAGOGASTRODUODENOSCOPY (EGD);  Surgeon: Missy Sabins, MD;  gastritis, cannot rule out duodenal ulcer   . Cardiac catheterization  2007    Left main 100%, RCA 70% ostial, all grafts were patent, EF 40-45 percent, distal circumflex subtotal after graft insertion, medical therapy      I have reviewed the patient's current medications. Medication Sig  aspirin 81 MG tablet Take 81 mg by mouth daily.  buPROPion (WELLBUTRIN XL) 150 MG 24 hr tablet Take 75 mg by mouth daily.   desvenlafaxine (PRISTIQ) 50 MG 24 hr tablet Take 25 mg by mouth every evening.   esomeprazole (NEXIUM) 40 MG packet Take 40 mg by mouth 2 (two) times daily.  ferrous sulfate (KP FERROUS SULFATE) 325 (65 FE) MG tablet Take 325 mg by mouth daily.  isosorbide mononitrate (IMDUR) 30 MG 24 hr tablet Take  30 mg by mouth daily.  lisinopril (PRINIVIL,ZESTRIL) 10 MG tablet Take 0.5 tablets (5 mg total) by mouth daily. Patient taking differently: Take 10 mg by mouth daily.   metoprolol succinate (TOPROL-XL) 25 MG 24 hr tablet Take 25 mg by mouth every evening.   rosuvastatin (CRESTOR) 10 MG tablet Take 5 mg by mouth every evening.   vitamin B-12 (CYANOCOBALAMIN) 1000 MCG tablet Take 1,000 mcg by mouth every evening.    Scheduled Meds:  Continuous Infusions: . heparin 900 Units/hr (10/13/14 1203)   PRN Meds:.  Allergies: No Known Allergies  History   Social History  . Marital Status: Married    Spouse Name: N/A  . Number of  Children: N/A  . Years of Education: N/A   Occupational History  . Retired    Social History Main Topics  . Smoking status: Never Smoker   . Smokeless tobacco: Never Used  . Alcohol Use: 4.2 oz/week    7 Glasses of wine per week     Comment: wine - daily  . Drug Use: No  . Sexual Activity: No   Other Topics Concern  . Not on file   Social History Narrative   He lives in [facility], independent living, with his wife.    Family History  Problem Relation Age of Onset  . Heart failure Brother    Family Status  Relation Status Death Age  . Mother Deceased   . Father Deceased     Review of Systems:   Full 14-point review of systems otherwise negative except as noted above.  Physical Exam: Blood pressure 148/50, pulse 55, temperature 97.6 F (36.4 C), temperature source Oral, resp. rate 23, SpO2 96 %. General: Well developed, well nourished,male in no acute distress. Head: Normocephalic, atraumatic, sclera non-icteric, no xanthomas, nares are without discharge. Dentition: for  Neck:  soft bilateral arotid bruits. JVD not elevated. No thyromegally Lungs: Good expansion bilaterally. without wheezes or rhonchi.  Heart: Regular rate and rhythm with S1 S2.  No S3 or S4. soft systolic, no rubs, or gallops appreciated. Abdomen: Soft, non-tender, non-distended with normoactive bowel sounds. No hepatomegaly. No rebound/guarding. No obvious abdominal masses. Msk:  Strength and tone appear normal for age. No joint deformities or effusions, no spine or costo-vertebral angle tenderness. Extremities: No clubbing or cyanosis. No edema.  Distal pedal pulses are 2+ in 4 extrem Neuro: Alert and oriented X 3. Moves all extremities spontaneously. No focal deficits noted. Psych:  Responds to questions appropriately with a normal affect. Skin: No rashes noted. Drew family's attention to some lesions on his back,  they stated his primary M.D. is following   Labs:   Lab Results  Component Value  Date   WBC 6.5 10/13/2014   HGB 12.6* 10/13/2014   HCT 38.2* 10/13/2014   MCV 89.7 10/13/2014   PLT 141* 10/13/2014    Recent Labs Lab 10/13/14 1035  NA 139  K 5.0  CL 110  CO2 21*  BUN 34*  CREATININE 2.09*  CALCIUM 8.9  GLUCOSE 96    Recent Labs  10/13/14 1041  TROPIPOC 0.13*    Radiology/Studies: Dg Chest 2 View 10/13/2014   CLINICAL DATA:  Chest pain.  EXAM: CHEST  2 VIEW  COMPARISON:  September 07, 2013.  FINDINGS: Stable cardiomediastinal silhouette. Stable large hiatal hernia is noted. Sternal sutures and surgical clips are noted. No pneumothorax or pleural effusion is noted. No acute pulmonary disease is noted. Bony thorax is intact.  IMPRESSION: No acute cardiopulmonary  abnormality seen.   Electronically Signed   By: Marijo Conception, M.D.   On: 10/13/2014 10:57    Cardiac Cath: 02/04/2006 FINDINGS: The left main is occluded. There right coronary artery has an ostial 70% lesion was catheter damping. There is moderate disease throughout mid vessel. SVG to diagonal is widely patent with mild irregularities. There is a 70% lesion in the diagonal proximal to the graft insertion. The SVG to the circumflex/OM is widely patent. The circumflex and OM vessels are diffusely diseased. At the distal circumflex there is a subtotal occlusion which may in fact be the culprit vessel for the patient's clinical presentation. The LIMA to the LAD is widely patent. There is diffuse severe disease in the mid and distal LAD. There is a diagonal seen backfilling. The SVG to RCA is widely patent. There are mild irregularities. There is a patent posterior descending artery and posterolateral branch. The left ventriculogram shows inferoapical focal hypokinesis. Overall left ventricular ejection fraction is 40-45%. The abdominal aortogram reveals diffuse moderate atherosclerosis of the infrarenal aorta. There are single renal arteries to each kidney. There  is mild plaquing in both ostia of the renal arteries.  IMPRESSION: 1. Patent left internal mammary artery to left anterior descending,  saphenous vein graft to obtuse marginal, saphenous vein graft to  diagonal, saphenous vein graft to right coronary artery. 2. Severe native three-vessel coronary artery disease including distal  circumflex disease which is the likely culprit for this clinical  presentation. It is not amenable to percutaneous coronary  intervention. There is also severe diffuse left anterior descending  disease noted. 3. The only lesion amenable to percutaneous coronary intervention is the  ostial right coronary artery, but since the vein graft to the right  coronary artery is patent, no percutaneous coronary intervention is  necessary. 4. No renal artery stenosis. 5. Decreased left ventricular function with inferoapical hypokinesis.  Echo: 11/01/2008 Conclusions EF 60-65 percent with no wall motion abnormalities   ECG: sinus rhythm, no acute ischemic changes   ASSESSMENT AND PLAN:  Principal Problem:   Non-STEMI (non-ST elevated myocardial infarction) - Admit, continue to cycle enzymes, continue aspirin, heparin and add IV nitrates if pain recurs. - Change Imdur to nitroglycerin glycerin paste and continue home dose of beta blocker, statin  Active Problems:   Hypertension - Review meds once he stabilizes, not able to increase beta blocker due to resting bradycardia    Hyperlipidemia - Continue statin, check profile and LFTs    CKD (chronic kidney disease) - Hydrate and follow     Elevated troponin - See above    Signed, Rosaria Ferries, PA-C 10/13/2014 1:11 PM Beeper [phone]    See separate progress note on patient Cr is 2.09  Since pain free will hydrate and do cath in  Am.    Jenkins Rouge

## 2014-10-13 NOTE — Progress Notes (Signed)
CRITICAL VALUE ALERT  Critical value received:  PTT  Date of notification:  10/13/14  Time of notification:  1650  Critical value read back:yes  Nurse who received alert:  Gerlene Fee  MD notified (1st page):  Almyra Deforest  Time of first page:  1651  MD notified (2nd page):  Time of second page:  Responding MD:  Almyra Deforest  Time MD responded:  [phone]

## 2014-10-13 NOTE — Progress Notes (Signed)
Paged by nursing staff regarding PTT level >200, discussed with Frank/Pharmacist, likely related to timing of initial draw and first heparin bolus. Next heparin check 6pm (1 hr from now) which will reflect a more accurate result. Patient has no bleeding issue at this time.   Hilbert Corrigan PA Pager: [phone]

## 2014-10-13 NOTE — ED Provider Notes (Signed)
CSN: [id number]     Arrival date & time 10/13/14  1018 History   First MD Initiated Contact with Patient 10/13/14 1019     Chief Complaint  Patient presents with  . Chest Pain     (Consider location/radiation/quality/duration/timing/severity/associated sxs/prior Treatment) HPI Comments: 81 year old male with history of quadruple bypass comes in with complaint chest pain. Patient was at his home. Had abrupt onset of bilateral chest pain with radiation to bilateral arms. Last 30 minutes. Self resolved. Patient did take ASA 325. Was given nitroglycerin by EMS however states he had no pain at the time pain free on arrival  Patient is a 81 y.o. male presenting with chest pain.  Chest Pain Pain location:  L chest and R chest Pain quality: aching   Radiates to: Bilateral arms. Pain radiates to the back: no   Pain severity:  Moderate Onset quality:  Sudden Duration:  30 minutes Timing:  Constant Progression:  Resolved Chronicity:  New Context: at rest   Associated symptoms: no abdominal pain, no back pain, no fatigue, no headache and no shortness of breath     Past Medical History  Diagnosis Date  . Hypertension   . Anxiety   . Depression   . GERD (gastroesophageal reflux disease)   . Hyperlipidemia   . CAD (coronary artery disease)     with CABG in 1983  . Iron deficiency anemia   . Erectile dysfunction   . Vasovagal syncope 05/2006  . Hx of adenomatous colonic polyps 2007  . Sigmoid diverticulosis   . Weight loss   . Urethral stricture 11/2008  . Shingles 1990's  . Inguinal hernia   . Anginal pain ~ 1992  . Claudication     BLE  . Dyspnea 2010    syndrome - extensive  . Exertional shortness of breath   . History of blood transfusion 2014  . H/O hiatal hernia   . Upper GI bleeding 2014    Gastritis and possible duodenal ulcer seen on EGD, felt secondary to NSAIDs   Past Surgical History  Procedure Laterality Date  . Cholecystectomy  1980's  . Coronary artery bypass  graft  1994     LIMA-LAD, SVG-D1, SVG-OM,  SVG-RCA; performed in [place], [place]  . Inguinal hernia repair Left   . Esophagogastroduodenoscopy N/A 03/25/2013    Procedure: ESOPHAGOGASTRODUODENOSCOPY (EGD);  Surgeon: Missy Sabins, MD;  gastritis, cannot rule out duodenal ulcer   . Cardiac catheterization  2007    Left main 100%, RCA 70% ostial, all grafts were patent, EF 40-45 percent, distal circumflex subtotal after graft insertion, medical therapy    Family History  Problem Relation Age of Onset  . Heart failure Brother    History  Substance Use Topics  . Smoking status: Never Smoker   . Smokeless tobacco: Never Used  . Alcohol Use: 4.2 oz/week    7 Glasses of wine per week     Comment: wine - daily    Review of Systems  Constitutional: Negative for fatigue.  HENT: Negative for congestion.   Respiratory: Negative for shortness of breath.   Cardiovascular: Positive for chest pain.  Gastrointestinal: Negative for abdominal pain.  Musculoskeletal: Negative for back pain.  Skin: Negative for rash.  Neurological: Negative for headaches.  Psychiatric/Behavioral: Negative for confusion.  All other systems reviewed and are negative.     Allergies  Review of patient's allergies indicates no known allergies.  Home Medications   Prior to Admission medications   Medication Sig Start  Date End Date Taking? Authorizing Provider  aspirin 81 MG tablet Take 81 mg by mouth daily.   Yes Historical Provider, MD  buPROPion (WELLBUTRIN XL) 150 MG 24 hr tablet Take 75 mg by mouth daily.    Yes Historical Provider, MD  desvenlafaxine (PRISTIQ) 50 MG 24 hr tablet Take 25 mg by mouth every evening.    Yes Historical Provider, MD  esomeprazole (NEXIUM) 40 MG packet Take 40 mg by mouth 2 (two) times daily. 03/26/13  Yes Lavone Orn, MD  ferrous sulfate (KP FERROUS SULFATE) 325 (65 FE) MG tablet Take 325 mg by mouth daily.   Yes Historical Provider, MD  isosorbide mononitrate (IMDUR) 30 MG  24 hr tablet Take 30 mg by mouth daily.   Yes Historical Provider, MD  lisinopril (PRINIVIL,ZESTRIL) 10 MG tablet Take 0.5 tablets (5 mg total) by mouth daily. Patient taking differently: Take 10 mg by mouth daily.  08/17/12  Yes Josetta Huddle, MD  metoprolol succinate (TOPROL-XL) 25 MG 24 hr tablet Take 25 mg by mouth every evening.    Yes Historical Provider, MD  rosuvastatin (CRESTOR) 10 MG tablet Take 5 mg by mouth every evening.    Yes Historical Provider, MD  vitamin B-12 (CYANOCOBALAMIN) 1000 MCG tablet Take 1,000 mcg by mouth every evening.    Yes Historical Provider, MD   BP 131/59 mmHg  Pulse 55  Temp(Src) 97.6 F (36.4 C) (Oral)  Resp 23  SpO2 96% Physical Exam  Constitutional: He appears well-developed.  HENT:  Head: Normocephalic and atraumatic.  Eyes: Pupils are equal, round, and reactive to light.  Neck: Normal range of motion.  Cardiovascular: Normal rate and intact distal pulses.   Murmur heard. Pulmonary/Chest: Effort normal. No respiratory distress.  Abdominal: Soft. He exhibits no distension. There is no tenderness.  Musculoskeletal: Normal range of motion.  Neurological: He is alert. No cranial nerve deficit. He exhibits normal muscle tone.  Skin: Skin is warm.  Vitals reviewed.   ED Course  Procedures (including critical care time) Labs Review Labs Reviewed  CBC WITH DIFFERENTIAL/PLATELET - Abnormal; Notable for the following:    Hemoglobin 12.6 (*)    HCT 38.2 (*)    Platelets 141 (*)    All other components within normal limits  BASIC METABOLIC PANEL - Abnormal; Notable for the following:    CO2 21 (*)    BUN 34 (*)    Creatinine, Ser 2.09 (*)    GFR calc non Af Amer 27 (*)    GFR calc Af Amer 31 (*)    All other components within normal limits  I-STAT TROPOININ, ED - Abnormal; Notable for the following:    Troponin i, poc 0.13 (*)    All other components within normal limits  HEPARIN LEVEL (UNFRACTIONATED)  PROTIME-INR  APTT    Imaging  Review Dg Chest 2 View  10/13/2014   CLINICAL DATA:  Chest pain.  EXAM: CHEST  2 VIEW  COMPARISON:  September 07, 2013.  FINDINGS: Stable cardiomediastinal silhouette. Stable large hiatal hernia is noted. Sternal sutures and surgical clips are noted. No pneumothorax or pleural effusion is noted. No acute pulmonary disease is noted. Bony thorax is intact.  IMPRESSION: No acute cardiopulmonary abnormality seen.   Electronically Signed   By: Marijo Conception, M.D.   On: 10/13/2014 10:57     EKG Interpretation None      MDM  81 year old male history of CABG comes with 30 minutes of chest pain radiating to left arm. On  arrival is hemodynamically stable vital signs. No longer having chest pain. EKG without signs of acute ischemia. He is chest pain-free here. ASA 325 by EMS. Initial labs significant for slightly elevated creatinine 2.09 as well as a slightly elevated troponin 0.13. Heparin bolus and infusion started. Cardiology consult will admit.   Final diagnoses:  NSTEMI (non-ST elevated myocardial infarction)  Other chest pain        Robynn Pane, MD 10/13/14 1416  Evelina Bucy, MD 10/13/14 1520

## 2014-10-13 NOTE — Care Management Note (Signed)
Case Management Note  Patient Details  Name: Scott [name] MRN: [id number] Date of Birth: 12/02/1923  Subjective/Objective:                  Pt admitted for cp-Nstemi. Plan for hydration overnight and cath Friday.   Action/Plan: CM will continue to monitor.    Expected Discharge Date:                  Expected Discharge Plan:  Home/Self Care (Per pt he is from [facility] with wife. )  In-House Referral:     Discharge planning Services  CM Consult  Post Acute Care Choice:    Choice offered to:     DME Arranged:    DME Agency:     HH Arranged:    [place] Agency:     Status of Service:     Medicare Important Message Given:    Date Medicare IM Given:    Medicare IM give by:    Date Additional Medicare IM Given:    Additional Medicare Important Message give by:     If discussed at [place] of Stay Meetings, dates discussed:    Additional Comments:  Bethena Roys, RN 10/13/2014, 4:31 PM

## 2014-10-13 NOTE — ED Notes (Signed)
Lab results of I-Stat Troponin was 0.13 reported Dr.Walden.

## 2014-10-13 NOTE — ED Notes (Signed)
Wife and daughter at bedside. 

## 2014-10-13 NOTE — Progress Notes (Addendum)
ANTICOAGULATION CONSULT NOTE - Initial Consult  Pharmacy Consult for Heparin  Indication: chest pain/ACS  No Known Allergies  Patient Measurements:    Pt Stated Weight 160 lb (72.7 kg) Pt Stated Height: 5 ft 7 in Heparin Dosing Weight: 72.7 kg  Vital Signs: Temp: 97.6 F (36.4 C) (05/26 1023) Temp Source: Oral (05/26 1023) BP: 148/50 mmHg (05/26 1023) Pulse Rate: 55 (05/26 1023)  Labs:  Recent Labs  10/13/14 1035  HGB 12.6*  HCT 38.2*  PLT 141*    CrCl cannot be calculated (Unknown ideal weight.).   Medical History: Past Medical History  Diagnosis Date  . Hypertension   . Anxiety   . Depression   . GERD (gastroesophageal reflux disease)   . Hyperlipidemia   . CAD (coronary artery disease)     with CABG in 1983  . Iron deficiency anemia   . Erectile dysfunction   . Vasovagal syncope 05/2006  . Hx of adenomatous colonic polyps 2007  . Sigmoid diverticulosis   . Weight loss   . Urethral stricture 11/2008  . Shingles 1990's  . Inguinal hernia     "unrepaired one in my right side now; doesn't bother me" (03/24/2013)  . Anginal pain ~ 1992    "before by bypass" (03/24/2013)  . Claudication     "BLE" (03/24/2013)  . Dyspnea 2010    syndrome - extensive  . Exertional shortness of breath   . History of blood transfusion     "suppose to get a 2nd bag of blood tonight; this is my first blood transfusion" (03/24/2013)  . H/O hiatal hernia     Medications:   (Not in a hospital admission)  Assessment: 81 year old male presenting with a CC of Chest Pain. Abrupt bilateral CP with radiation. This resolved. Patient took ASA 325 mg and nitro given by EMS.  PMH: CABG, HTN  Initial trop positive at 0.13. SCr 2.09 (BL around 2 in 2014)  Heparin will be initiated for ACS.  Goal of Therapy:  Heparin level 0.3 - 0.7 Monitor platelets by anticoagulation protocol: Yes   Plan:  -Initiate heparin with a bolus of 4000 units followed by an infusion of 900  units/hr -Initial HL in 6 hours -Daily CBC and HL  Levester Fresh, PharmD, BCPS Clinical Pharmacist Pager [phone] 10/13/2014 11:15 AM

## 2014-10-13 NOTE — ED Notes (Signed)
Per EMS pt c/o chest pain radiated down both arms. Pt took 1 '81mg'$  ASA at home, and received 1 nitro by EMS. Pt denies pain at this time. Pt vitals 148/50, HR 56, 98% Rm Air Pt sts he has hx of heart procedure 40 years ago, unknown of type of procedure.

## 2014-10-13 NOTE — Progress Notes (Signed)
ANTICOAGULATION CONSULT NOTE - Follow Up Consult  Pharmacy Consult for heparin Indication: chest pain/ACS  No Known Allergies  Patient Measurements: Height: '5\' 7"'$  (170.2 cm) Weight: 157 lb 1.6 oz (71.26 kg) IBW/kg (Calculated) : 66.1 Heparin Dosing Weight: 70kg  Vital Signs: Temp: 97.5 F (36.4 C) (05/26 2020) Temp Source: Oral (05/26 2020) BP: 166/62 mmHg (05/26 2020) Pulse Rate: 51 (05/26 2020)  Labs:  Recent Labs  10/13/14 1035 10/13/14 1510 10/13/14 2007  HGB 12.6*  --   --   HCT 38.2*  --   --   PLT 141*  --   --   APTT  --  >200*  --   LABPROT  --  14.8  --   INR  --  1.14  --   HEPARINUNFRC  --   --  1.00*  CREATININE 2.09*  --   --   TROPONINI  --  0.25*  --     Estimated Creatinine Clearance: 22.8 mL/min (by C-G formula based on Cr of 2.09).  Assessment: 81 year old male admitted with chest pain started on IV heparin infusion.   Aptt earlier >200, assumed to be d/t bolus of heparin. HL drawn late at 2000 resulted at 1.0. No bleeding noted by patient, lab drawn opposite arm of infusion. Instructed nurse to hold infusion for now and will restart at lower rate.  Goal of Therapy:  Heparin level 0.3-0.7 units/ml Monitor platelets by anticoagulation protocol: Yes   Plan:  Hold heparin x 1 hour Restart infusion at 600 units/hr then follow up with 8 hour level CBC/HL in am  Erin Hearing PharmD., BCPS Clinical Pharmacist Pager [phone] 10/13/2014 9:51 PM

## 2014-10-13 NOTE — Progress Notes (Signed)
Patient ID: Scott [name], male   DOB: Apr 14, 1924, 81 y.o.   MRN: [id number] See full PA note  82 y.o. admitted with sudden onset SSCP radiating to both arms with chest heaviness.  History of CABG 30 years Ago in [place].  Pain exertional with walking this am now resolved ECG no acute changes Troponin elevated At .13  Hemodynamics stable.  Exam with poor memory no significant murmur  Risks of cath discussed including Stroke willing to proceed.  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  CXR shows LIMA clips not RIMA Cannot see graft markers.  May be best to do from femoral approach given that We don't know where grafts are.   Lab called orders written  Jenkins Rouge

## 2014-10-13 NOTE — Progress Notes (Signed)
UR Completed Brinsley Wence Graves-Bigelow, RN,BSN [phone]  

## 2014-10-14 ENCOUNTER — Encounter (HOSPITAL_COMMUNITY): Payer: Self-pay | Admitting: Cardiovascular Disease

## 2014-10-14 ENCOUNTER — Encounter (HOSPITAL_COMMUNITY)
Admission: EM | Disposition: A | Discharge status: Other Acute Inpatient Hospital | Payer: Medicare Other | Source: Home / Self Care | Attending: Cardiovascular Disease

## 2014-10-14 DIAGNOSIS — I2510 Atherosclerotic heart disease of native coronary artery without angina pectoris: Secondary | ICD-10-CM

## 2014-10-14 DIAGNOSIS — R001 Bradycardia, unspecified: Secondary | ICD-10-CM

## 2014-10-14 DIAGNOSIS — E785 Hyperlipidemia, unspecified: Secondary | ICD-10-CM

## 2014-10-14 HISTORY — PX: CARDIAC CATHETERIZATION: SHX172

## 2014-10-14 LAB — COMPREHENSIVE METABOLIC PANEL
ALBUMIN: 3.3 g/dL — AB (ref 3.5–5.0)
ALT: 17 U/L (ref 17–63)
AST: 18 U/L (ref 15–41)
Alkaline Phosphatase: 44 U/L (ref 38–126)
Anion gap: 4 — ABNORMAL LOW (ref 5–15)
BUN: 25 mg/dL — ABNORMAL HIGH (ref 6–20)
CO2: 23 mmol/L (ref 22–32)
Calcium: 8.8 mg/dL — ABNORMAL LOW (ref 8.9–10.3)
Chloride: 114 mmol/L — ABNORMAL HIGH (ref 101–111)
Creatinine, Ser: 1.82 mg/dL — ABNORMAL HIGH (ref 0.61–1.24)
GFR calc Af Amer: 37 mL/min — ABNORMAL LOW (ref >60)
GFR calc non Af Amer: 32 mL/min — ABNORMAL LOW (ref >60)
GLUCOSE: 95 mg/dL (ref 65–99)
POTASSIUM: 5 mmol/L (ref 3.5–5.1)
SODIUM: 141 mmol/L (ref 135–145)
Total Bilirubin: 0.6 mg/dL (ref 0.3–1.2)
Total Protein: 5.7 g/dL — ABNORMAL LOW (ref 6.5–8.1)

## 2014-10-14 LAB — CBC
HCT: 39.1 % (ref 39.0–52.0)
Hemoglobin: 12.7 g/dL — ABNORMAL LOW (ref 13.0–17.0)
MCH: 29.3 pg (ref 26.0–34.0)
MCHC: 32.5 g/dL (ref 30.0–36.0)
MCV: 90.3 fL (ref 78.0–100.0)
Platelets: 131 10*3/uL — ABNORMAL LOW (ref 150–400)
RBC: 4.33 MIL/uL (ref 4.22–5.81)
RDW: 13.9 % (ref 11.5–15.5)
WBC: 6.4 10*3/uL (ref 4.0–10.5)

## 2014-10-14 LAB — LIPID PANEL
Cholesterol: 175 mg/dL (ref 0–200)
HDL: 39 mg/dL — ABNORMAL LOW (ref >40)
LDL Cholesterol: 114 mg/dL — ABNORMAL HIGH (ref 0–99)
Total CHOL/HDL Ratio: 4.5 RATIO
Triglycerides: 108 mg/dL (ref <150)
VLDL: 22 mg/dL (ref 0–40)

## 2014-10-14 LAB — TROPONIN I: TROPONIN I: 0.2 ng/mL — AB (ref <0.031)

## 2014-10-14 LAB — HEPARIN LEVEL (UNFRACTIONATED): Heparin Unfractionated: 0.53 IU/mL (ref 0.30–0.70)

## 2014-10-14 SURGERY — LEFT HEART CATH AND CORS/GRAFTS ANGIOGRAPHY

## 2014-10-14 MED ORDER — ACETAMINOPHEN 325 MG PO TABS: 650.0000 mg | ORAL_TABLET | ORAL | Status: DC | PRN

## 2014-10-14 MED ORDER — LIDOCAINE HCL (PF) 1 % IJ SOLN
INTRAMUSCULAR | Status: DC | PRN
  Administered 2014-10-14: 5 mL via SUBCUTANEOUS

## 2014-10-14 MED ORDER — ONDANSETRON HCL 4 MG/2ML IJ SOLN
4.0000 mg | Freq: Four times a day (QID) | INTRAMUSCULAR | Status: DC | PRN
Start: 2014-10-14 — End: 2014-10-14

## 2014-10-14 MED ORDER — SODIUM CHLORIDE 0.9 % IV SOLN
250.0000 mL | INTRAVENOUS | Status: DC | PRN
Start: 2014-10-14 — End: 2014-10-15

## 2014-10-14 MED ORDER — SODIUM CHLORIDE 0.9 % WEIGHT BASED INFUSION
3.0000 mL/kg/h | INTRAVENOUS | Status: AC
  Administered 2014-10-14 (×2): 3 mL/kg/h via INTRAVENOUS

## 2014-10-14 MED ORDER — HEPARIN (PORCINE) IN NACL 2-0.9 UNIT/ML-% IJ SOLN
INTRAMUSCULAR | Status: AC
  Filled 2014-10-14: qty 1000

## 2014-10-14 MED ORDER — SODIUM CHLORIDE 0.9 % IJ SOLN
3.0000 mL | Freq: Two times a day (BID) | INTRAMUSCULAR | Status: DC
  Administered 2014-10-14 – 2014-10-15 (×3): 3 mL via INTRAVENOUS

## 2014-10-14 MED ORDER — SODIUM CHLORIDE 0.9 % IJ SOLN: 3.0000 mL | INTRAMUSCULAR | Status: DC | PRN

## 2014-10-14 MED ORDER — NITROGLYCERIN 1 MG/10 ML FOR IR/CATH LAB
INTRA_ARTERIAL | Status: AC
  Filled 2014-10-14: qty 10

## 2014-10-14 MED ORDER — IOHEXOL 350 MG/ML SOLN
INTRAVENOUS | Status: DC | PRN
  Administered 2014-10-14: 100 mL via INTRA_ARTERIAL
  Administered 2014-10-14: 50 mL via INTRA_ARTERIAL

## 2014-10-14 MED ORDER — MORPHINE SULFATE 2 MG/ML IJ SOLN: 2.0000 mg | INTRAMUSCULAR | Status: DC | PRN

## 2014-10-14 MED ORDER — ASPIRIN 81 MG PO CHEW: 81.0000 mg | CHEWABLE_TABLET | Freq: Every day | ORAL | Status: DC

## 2014-10-14 MED ORDER — LIDOCAINE HCL (PF) 1 % IJ SOLN
INTRAMUSCULAR | Status: AC
  Filled 2014-10-14: qty 30

## 2014-10-14 SURGICAL SUPPLY — 10 items
CATH INFINITI 5 FR IM (CATHETERS) ×2 IMPLANT
CATH INFINITI 5FR MULTPACK ANG (CATHETERS) ×2 IMPLANT
DEVICE CLOSURE MYNXGRIP 5F (Vascular Products) ×2 IMPLANT
KIT HEART LEFT (KITS) ×3 IMPLANT
PACK CARDIAC CATHETERIZATION (CUSTOM PROCEDURE TRAY) ×3 IMPLANT
SHEATH PINNACLE 5F 10CM (SHEATH) ×2 IMPLANT
SYR MEDRAD MARK V 150ML (SYRINGE) ×3 IMPLANT
TRANSDUCER W/STOPCOCK (MISCELLANEOUS) ×3 IMPLANT
WIRE EMERALD 3MM-J .035X150CM (WIRE) ×2 IMPLANT
WIRE EMERALD 3MM-J .035X260CM (WIRE) ×2 IMPLANT

## 2014-10-14 NOTE — Interval H&P Note (Signed)
Cath Lab Visit (complete for each Cath Lab visit)  Clinical Evaluation Leading to the Procedure:   ACS: Yes.    Non-ACS:    Anginal Classification: CCS IV  Anti-ischemic medical therapy: Minimal Therapy (1 class of medications)  Non-Invasive Test Results: No non-invasive testing performed  Prior CABG: Previous CABG      History and Physical Interval Note:  10/14/2014 10:33 AM  Scott [name]  has presented today for surgery, with the diagnosis of cp  The various methods of treatment have been discussed with the patient and family. After consideration of risks, benefits and other options for treatment, the patient has consented to  Procedure(s): Left Heart Cath and Cors/Grafts Angiography (N/A) as a surgical intervention .  The patient's history has been reviewed, patient examined, no change in status, stable for surgery.  I have reviewed the patient's chart and labs.  Questions were answered to the patient's satisfaction.     Quay Burow

## 2014-10-14 NOTE — Progress Notes (Signed)
Rn noted patient to have hematoma under R groin site. Cath lab was called to assess site. Cath lab states it is a hematoma, small in size. Pt states there is no pain or tenderness upon palpation. Will continue to assess site. Etta Quill, RN

## 2014-10-14 NOTE — Progress Notes (Signed)
Patient Name: Scott [name] Date of Encounter: 10/14/2014  Primary Cardiologist: Dr. Beau Fanny   Principal Problem:   Non-STEMI (non-ST elevated myocardial infarction) Active Problems:   Hypertension   Hyperlipidemia   CKD (chronic kidney disease)   Elevated troponin    SUBJECTIVE  Has not had any recurrent CP since yesterday. Denies any SOB  CURRENT MEDS . aspirin  81 mg Oral Daily  . buPROPion  37.5 mg Oral BID  . esomeprazole  40 mg Oral BID AC  . ferrous sulfate  325 mg Oral Q breakfast  . lisinopril  10 mg Oral Daily  . metoprolol succinate  25 mg Oral QPM  . nitroGLYCERIN  0.5 inch Topical 4 times per day  . pneumococcal 23 valent vaccine  0.5 mL Intramuscular Tomorrow-1000  . rosuvastatin  5 mg Oral QPM  . sodium chloride  3 mL Intravenous Q12H  . sodium chloride  3 mL Intravenous Q12H  . venlafaxine XR  37.5 mg Oral Q breakfast  . vitamin B-12  1,000 mcg Oral QPM    OBJECTIVE  Filed Vitals:   10/13/14 1351 10/13/14 1432 10/13/14 2020 10/14/14 0500  BP: 131/59 156/65 166/62 149/69  Pulse:  57 51 49  Temp:  97.6 F (36.4 C) 97.5 F (36.4 C) 97.8 F (36.6 C)  TempSrc:  Oral Oral Oral  Resp:  '25 22 20  '$ Height:  '5\' 7"'$  (1.702 m)    Weight:  157 lb 1.6 oz (71.26 kg)  157 lb (71.215 kg)  SpO2:  100% 98% 98%    Intake/Output Summary (Last 24 hours) at 10/14/14 0835 Last data filed at 10/13/14 2235  Gross per 24 hour  Intake      0 ml  Output    600 ml  Net   -600 ml   Filed Weights   10/13/14 1432 10/14/14 0500  Weight: 157 lb 1.6 oz (71.26 kg) 157 lb (71.215 kg)    PHYSICAL EXAM  General: Pleasant, NAD. Neuro: Alert and oriented X 3. Moves all extremities spontaneously. Psych: Normal affect. HEENT:  Normal  Neck: Supple without bruits or JVD. Lungs:  Resp regular and unlabored, CTA. Heart: RRR no s3, s4, or murmurs. Abdomen: Soft, non-tender, non-distended, BS + x 4.  Extremities: No clubbing, cyanosis or edema. Difficult to feel radial  pulse, may need femoral approach.  Accessory Clinical Findings  CBC  Recent Labs  10/13/14 1035 10/14/14 0720  WBC 6.5 6.4  NEUTROABS 4.4  --   HGB 12.6* 12.7*  HCT 38.2* 39.1  MCV 89.7 90.3  PLT 141* 284*   Basic Metabolic Panel  Recent Labs  10/13/14 1035  NA 139  K 5.0  CL 110  CO2 21*  GLUCOSE 96  BUN 34*  CREATININE 2.09*  CALCIUM 8.9   Cardiac Enzymes  Recent Labs  10/13/14 1510 10/13/14 2128 10/14/14 0314  TROPONINI 0.25* 0.27* 0.20*   Fasting Lipid Panel  Recent Labs  10/14/14 0314  CHOL 175  HDL 39*  LDLCALC 114*  TRIG 108  CHOLHDL 4.5   Thyroid Function Tests  Recent Labs  10/13/14 1510  TSH 2.552    TELE NSR with HR 40-50s    ECG  Sinus bradycardia without significant ST-T wave changes   Radiology/Studies  Dg Chest 2 View  10/13/2014   CLINICAL DATA:  Chest pain.  EXAM: CHEST  2 VIEW  COMPARISON:  September 07, 2013.  FINDINGS: Stable cardiomediastinal silhouette. Stable large hiatal hernia is noted. Sternal sutures and  surgical clips are noted. No pneumothorax or pleural effusion is noted. No acute pulmonary disease is noted. Bony thorax is intact.  IMPRESSION: No acute cardiopulmonary abnormality seen.   Electronically Signed   By: Marijo Conception, M.D.   On: 10/13/2014 10:57    ASSESSMENT AND PLAN  1. NSTEMI  - pending cath today, risk and benefit of procedure include bleeding, vascular/renal injury, arrhythmia, MI, stroke, loss of life or limb discussed with the patient, he display clear understanding and agree to proceed.   2. Bradycardia  - HR 40-50s, asymptomatic, on Toprol XL '25mg'$ , may need to hold BB  3. CAD s/p CABG (LIMA to LAD, SVG to OM, SVG to Diaganol, SVG to RCA)  - last cath 01/2006 EF 40-45%, occluded LM, 70% ost RCA stenosis, 70% diagonal stenossi, patent SVG to LCx and OM, patent LIMA to LAD, diffuse severe dx in mid to distal LAD, patent SVG to  RCA  4. HTN 5. HLD  6. CKD 7. H/o  syncope  Signed, Woodward Ku Pager: [phone] Patient evaluated  Agree with findings as noted above by Macy for cath today. [name]

## 2014-10-14 NOTE — H&P (View-Only) (Signed)
Patient ID: Scott [name], male   DOB: 1924/05/03, 81 y.o.   MRN: [id number] See full PA note  81 y.o. admitted with sudden onset SSCP radiating to both arms with chest heaviness.  History of CABG 30 years Ago in [place].  Pain exertional with walking this am now resolved ECG no acute changes Troponin elevated At .13  Hemodynamics stable.  Exam with poor memory no significant murmur  Risks of cath discussed including Stroke willing to proceed.  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  CXR shows LIMA clips not RIMA Cannot see graft markers.  May be best to do from femoral approach given that We don't know where grafts are.   Lab called orders written  Jenkins Rouge

## 2014-10-15 ENCOUNTER — Other Ambulatory Visit: Payer: Self-pay | Admitting: Cardiology

## 2014-10-15 DIAGNOSIS — N184 Chronic kidney disease, stage 4 (severe): Secondary | ICD-10-CM

## 2014-10-15 DIAGNOSIS — I25810 Atherosclerosis of coronary artery bypass graft(s) without angina pectoris: Secondary | ICD-10-CM | POA: Diagnosis present

## 2014-10-15 DIAGNOSIS — I214 Non-ST elevation (NSTEMI) myocardial infarction: Secondary | ICD-10-CM

## 2014-10-15 DIAGNOSIS — R001 Bradycardia, unspecified: Secondary | ICD-10-CM | POA: Diagnosis not present

## 2014-10-15 DIAGNOSIS — I25700 Atherosclerosis of coronary artery bypass graft(s), unspecified, with unstable angina pectoris: Secondary | ICD-10-CM

## 2014-10-15 LAB — BASIC METABOLIC PANEL
Anion gap: 6 (ref 5–15)
BUN: 22 mg/dL — ABNORMAL HIGH (ref 6–20)
CALCIUM: 8.7 mg/dL — AB (ref 8.9–10.3)
CO2: 22 mmol/L (ref 22–32)
Chloride: 111 mmol/L (ref 101–111)
Creatinine, Ser: 1.89 mg/dL — ABNORMAL HIGH (ref 0.61–1.24)
GFR calc Af Amer: 35 mL/min — ABNORMAL LOW (ref >60)
GFR calc non Af Amer: 30 mL/min — ABNORMAL LOW (ref >60)
Glucose, Bld: 103 mg/dL — ABNORMAL HIGH (ref 65–99)
Potassium: 4.7 mmol/L (ref 3.5–5.1)
Sodium: 139 mmol/L (ref 135–145)

## 2014-10-15 LAB — CBC
HCT: 34.2 % — ABNORMAL LOW (ref 39.0–52.0)
HEMOGLOBIN: 11.6 g/dL — AB (ref 13.0–17.0)
MCH: 30.4 pg (ref 26.0–34.0)
MCHC: 33.9 g/dL (ref 30.0–36.0)
MCV: 89.5 fL (ref 78.0–100.0)
Platelets: 124 10*3/uL — ABNORMAL LOW (ref 150–400)
RBC: 3.82 MIL/uL — ABNORMAL LOW (ref 4.22–5.81)
RDW: 14.1 % (ref 11.5–15.5)
WBC: 7.4 10*3/uL (ref 4.0–10.5)

## 2014-10-15 MED ORDER — BUPROPION HCL 75 MG PO TABS: 37.5000 mg | ORAL_TABLET | Freq: Two times a day (BID) | ORAL | Status: DC

## 2014-10-15 MED ORDER — LISINOPRIL 10 MG PO TABS: 10.0000 mg | ORAL_TABLET | Freq: Every day | ORAL | Status: DC

## 2014-10-15 MED ORDER — NITROGLYCERIN 0.4 MG SL SUBL: 0.4000 mg | SUBLINGUAL_TABLET | SUBLINGUAL | Status: DC | PRN

## 2014-10-15 MED ORDER — ACETAMINOPHEN 325 MG PO TABS: 650.0000 mg | ORAL_TABLET | ORAL | Status: DC | PRN

## 2014-10-15 NOTE — Discharge Instructions (Signed)
Heart Healthy Diet  Take 1 NTG, under your tongue, while sitting.  If no relief of pain may repeat NTG, one tab every 5 minutes up to 3 tablets total over 15 minutes.  If no relief CALL 911.  If you have dizziness/lightheadness  while taking NTG, stop taking and call 911.        Call if chest pain or SOB  Call [facility] at [phone] if any bleeding, swelling or drainage at cath site.  May shower, no tub baths for 48 hours for groin sticks. No lifting over 5 pounds for 3 days.  No Driving for 5 days if you drive

## 2014-10-15 NOTE — Discharge Summary (Signed)
Physician Discharge Summary       Patient ID: Scott [name] MRN: [id number] DOB/AGE: 79-Dec-1925 79 y.o.  Admit date: 10/13/2014 Discharge date: 10/15/2014 Primary Cardiologist:Dr. Irish Lack   Discharge Diagnoses:  Principal Problem:   Non-STEMI (non-ST elevated myocardial infarction) Active Problems:   Hypertension   Hyperlipidemia   CKD (chronic kidney disease)   Elevated troponin   Discharged Condition: good  Procedures: cardiac cath 10/14/14 by Dr. Mike Craze Course:  81 y.o. male with a history of CAD, CABG, subsequent non-STEMI, hypertension, hyperlipidemia and syncope. He ruled in for a non-STEMI in 2007 and grafts were patent at that time but there was distal disease in the circumflex for which medical therapy was recommended. His EF was 40-45 percent.  He has done well with medical management. 10/13/14, at rest at about 9 AM, he had sudden onset of substernal chest pain reached a 7/10. It radiated down both arms. He was a little diaphoretic but denies shortness of breath or nausea/vomiting. He told his wife who gave him on ibuprofen and call for help. He was given aspirin 81 mg 4 but the nurse. EMS responded and he received oxygen plus one sublingual nitroglycerin. He doesn't think the nitroglycerin helped but feels the oxygen made him feel better. In the hospital, he was started on a heparin drip. His pain gradually eased off and he was then pain-free.  He has no other new ongoing medical issues. The last time he had this pain was when he ruled in for a small heart attack and medical therapy was recommended. Troponin increased to 0.27 at peak.  Pt had cardiac cath Scott [name] has occluded vein grafts, patent LIMA to the LAD diagonal branch and widely patent dominant RCA. Dr. Adora Fridge  did perform a supravalvular aortogram demonstrating occluded vein grafts.  Pt's toprol was stopped due to bradycardia in the upper 40s to 50s.  His LDL is elevated, would increase  Crestor to 10 mg as outpt.     Pt also developed small hematoma at cath site that was stable without bruit at discharge.  He has old Rt inguinal hernia as well.  Pt seen and evaluated by Dr. Caryl Comes and found stable for discharge.    He will need follow up as outpt: 2 D echo- post MI to eval EF , BMP for CKD 3-4.   Pt left without his instructions, but they have been faxed to [facility] where he lives.  They also took out his IV.   Consults: cardiology     Significant Diagnostic Studies:  BMP Latest Ref Rng 10/15/2014 10/14/2014 10/13/2014  Glucose 65 - 99 mg/dL 103(H) 95 96  BUN 6 - 20 mg/dL 22(H) 25(H) 34(H)  Creatinine 0.61 - 1.24 mg/dL 1.89(H) 1.82(H) 2.09(H)  Sodium 135 - 145 mmol/L 139 141 139  Potassium 3.5 - 5.1 mmol/L 4.7 5.0 5.0  Chloride 101 - 111 mmol/L 111 114(H) 110  CO2 22 - 32 mmol/L 22 23 21(L)  Calcium 8.9 - 10.3 mg/dL 8.7(L) 8.8(L) 8.9   CBC Latest Ref Rng 10/15/2014 10/14/2014 10/13/2014  WBC 4.0 - 10.5 K/uL 7.4 6.4 6.5  Hemoglobin 13.0 - 17.0 g/dL 11.6(L) 12.7(L) 12.6(L)  Hematocrit 39.0 - 52.0 % 34.2(L) 39.1 38.2(L)  Platelets 150 - 400 K/uL 124(L) 131(L) 141(L)   Troponin pl pf 0.27 down to 0.20 at discharge  Lipid Panel     Component Value Date/Time   CHOL 175 10/14/2014 0314   TRIG 108 10/14/2014 0314  HDL 39* 10/14/2014 0314   CHOLHDL 4.5 10/14/2014 0314   VLDL 22 10/14/2014 0314   LDLCALC 114* 10/14/2014 0314   TSH  2.552  CARDIAC CATH: 3. CAD s/p CABG (LIMA to LAD, SVG to OM, SVG to Diaganol, SVG to RCA) -10/14/14: Cath: has occluded vein grafts, patent LIMA to the LAD diagonal branch and widely patent dominant RCA.  Medical therapy      CHEST 2 VIEW COMPARISON: September 07, 2013. FINDINGS: Stable cardiomediastinal silhouette. Stable large hiatal hernia is noted. Sternal sutures and surgical clips are noted. No pneumothorax or pleural effusion is noted. No acute pulmonary disease is noted. Bony thorax is  intact. IMPRESSION: No acute cardiopulmonary abnormality seen.  EKG:  Sinus bradycardia Otherwise normal ECG since last tracing no significant change  Discharge Exam: Blood pressure 110/53, pulse 60, temperature 98 F (36.7 C), temperature source Oral, resp. rate 18, height '5\' 7"'$  (1.702 m), weight 155 lb 8 oz (70.534 kg), SpO2 95 %.  Disposition: 01-Home or Self Care     Medication List    ASK your doctor about these medications        aspirin 81 MG tablet  Take 81 mg by mouth daily.     buPROPion 150 MG 24 hr tablet  Commonly known as:  WELLBUTRIN XL  Take 75 mg by mouth daily.     esomeprazole 40 MG packet  Commonly known as:  NEXIUM  Take 40 mg by mouth 2 (two) times daily.     isosorbide mononitrate 30 MG 24 hr tablet  Commonly known as:  IMDUR  Take 30 mg by mouth daily.     KP FERROUS SULFATE 325 (65 FE) MG tablet  Generic drug:  ferrous sulfate  Take 325 mg by mouth daily.     lisinopril 10 MG tablet  Commonly known as:  PRINIVIL,ZESTRIL  Take 0.5 tablets (5 mg total) by mouth daily.     metoprolol succinate 25 MG 24 hr tablet  Commonly known as:  TOPROL-XL  Take 25 mg by mouth every evening.     PRISTIQ 50 MG 24 hr tablet  Generic drug:  desvenlafaxine  Take 25 mg by mouth every evening.     rosuvastatin 10 MG tablet  Commonly known as:  CRESTOR  Take 5 mg by mouth every evening.     vitamin B-12 1000 MCG tablet  Commonly known as:  CYANOCOBALAMIN  Take 1,000 mcg by mouth every evening.          Discharge Instructions: Heart Healthy Diet  Take 1 NTG, under your tongue, while sitting.  If no relief of pain may repeat NTG, one tab every 5 minutes up to 3 tablets total over 15 minutes.  If no relief CALL 911.  If you have dizziness/lightheadness  while taking NTG, stop taking and call 911.        Call if chest pain or SOB  Call [facility] at [phone] if any bleeding, swelling or drainage at cath site.  May shower,  no tub baths for 48 hours for groin sticks. No lifting over 5 pounds for 3 days.  No Driving for 5 days if you drive      Signed: LFYBOF,BPZWC R Nurse Practitioner-Certified [place]: [facility] 10/15/2014, 11:31 AM  Time spent on discharge : >35 minutes.

## 2014-10-15 NOTE — Progress Notes (Addendum)
Pt left hospital without notifying nurse/staff. Dr. Caryl Comes notified. Contacted son-in-law and informed him that pt had left hospital. [place] where pt is a resident, spoke to [employer] who stated "pt has arrived and RN has been notified to remove IV's." Faxed discharge paperwork to [facility].

## 2014-10-15 NOTE — Progress Notes (Signed)
Pt removed telemetry monitor myself. Pt refusing to to put telemetry monitor back on. Will continue to monitor.

## 2014-10-15 NOTE — Progress Notes (Addendum)
Subjective: Pt dressed and tele off He plans to go home.   Objective: Vital signs in last 24 hours: Temp:  [97.4 F (36.3 C)-98.6 F (37 C)] 98 F (36.7 C) (05/28 0500) Pulse Rate:  [0-61] 60 (05/28 0500) Resp:  [0-22] 18 (05/28 0500) BP: (128-175)/(46-86) 146/69 mmHg (05/28 0500) SpO2:  [0 %-100 %] 95 % (05/28 0500) Weight:  [155 lb 8 oz (70.534 kg)] 155 lb 8 oz (70.534 kg) (05/28 0500) Weight change: -1 lb 9.6 oz (-0.726 kg) Last BM Date: 10/13/14 Intake/Output from previous day: +555 05/27 0701 - 05/28 0700 In: 805.5 [P.O.:300; I.V.:505.5] Out: 250 [Urine:250] Intake/Output this shift:    PE: General:Pleasant affect, NAD, up in room, dressed in street clothes Skin:Warm and dry, brisk capillary refill HEENT:normocephalic, sclera clear, mucus membranes moist Heart:S1S2 RRR without murmur, gallup, rub or click Lungs:clear without rales, rhonchi, or wheezes NWG:NFAO, non tender, + BS, do not palpate liver spleen or masses Ext:no lower ext edema, 2+ radial pulses, rt groin cath site with ecchymosis no hematoma at site, do not hear bruit, + inguinal hernia above stick site.   Neuro:alert and oriented, MAE, follows commands, + facial symmetry  tele:  SB to SR    Lab Results:  Recent Labs  10/14/14 0720 10/15/14 0344  WBC 6.4 7.4  HGB 12.7* 11.6*  HCT 39.1 34.2*  PLT 131* 124*   BMET  Recent Labs  10/14/14 0720 10/15/14 0344  NA 141 139  K 5.0 4.7  CL 114* 111  CO2 23 22  GLUCOSE 95 103*  BUN 25* 22*  CREATININE 1.82* 1.89*  CALCIUM 8.8* 8.7*    Recent Labs  10/13/14 2128 10/14/14 0314  TROPONINI 0.27* 0.20*    Lab Results  Component Value Date   CHOL 175 10/14/2014   HDL 39* 10/14/2014   LDLCALC 114* 10/14/2014   TRIG 108 10/14/2014   CHOLHDL 4.5 10/14/2014   No results found for: HGBA1C   Lab Results  Component Value Date   TSH 2.552 10/13/2014    Hepatic Function Panel  Recent Labs  10/14/14 0720  PROT 5.7*  ALBUMIN  3.3*  AST 18  ALT 17  ALKPHOS 44  BILITOT 0.6    Recent Labs  10/14/14 0314  CHOL 175   No results for input(s): PROTIME in the last 72 hours.     Studies/Results: Dg Chest 2 View  10/13/2014   CLINICAL DATA:  Chest pain.  EXAM: CHEST  2 VIEW  COMPARISON:  September 07, 2013.  FINDINGS: Stable cardiomediastinal silhouette. Stable large hiatal hernia is noted. Sternal sutures and surgical clips are noted. No pneumothorax or pleural effusion is noted. No acute pulmonary disease is noted. Bony thorax is intact.  IMPRESSION: No acute cardiopulmonary abnormality seen.   Electronically Signed   By: Marijo Conception, M.D.   On: 10/13/2014 10:57    Cardiac Cath: Mr. Schewe has occluded vein grafts, patent LIMA to the LAD diagonal branch and widely patent dominant RCA. I did perform a supravalvular aortogram demonstrating occluded vein grafts. We will get a 2-D echo for LV function. Continue medical therapy will be recommended  Medications: I have reviewed the patient's current medications. Scheduled Meds: . aspirin  81 mg Oral Daily  . buPROPion  37.5 mg Oral BID  . esomeprazole  40 mg Oral BID AC  . ferrous sulfate  325 mg Oral Q breakfast  . lisinopril  10 mg Oral Daily  .  metoprolol succinate  25 mg Oral QPM  . pneumococcal 23 valent vaccine  0.5 mL Intramuscular Tomorrow-1000  . rosuvastatin  5 mg Oral QPM  . sodium chloride  3 mL Intravenous Q12H  . venlafaxine XR  37.5 mg Oral Q breakfast  . vitamin B-12  1,000 mcg Oral QPM   Continuous Infusions:  PRN Meds:.sodium chloride, acetaminophen, ALPRAZolam, morphine injection, nitroGLYCERIN, ondansetron (ZOFRAN) IV, sodium chloride, zolpidem  Assessment/Plan:  81 y.o. admitted with sudden onset SSCP radiating to both arms with chest heaviness. History of CABG 30 years Ago in [place]. Pain exertional with walking this am now resolved ECG no acute changes Troponin elevated At .13 Hemodynamics stable. Exam with poor memory no  significant murmur. Cath on 10/14/14 now with ecchymosis  at cath site.  Inguinal hernia above stick site.    Principal Problem:   Non-STEMI (non-ST elevated myocardial infarction) Active Problems:   Hypertension   Hyperlipidemia   CKD (chronic kidney disease)   Elevated troponin   1. NSTEMI - pk troponin 0.27 now down to 0.20.  No chest pain and no SOB  2. Bradycardia - HR 40-50s, asymptomatic, on Toprol XL 3m, did not receive BB yesterday due to bradycardia-- ? Decrease dose  3. CAD s/p CABG (LIMA to LAD, SVG to OM, SVG to Diaganol, SVG to RCA) - last cath 01/2006 EF 40-45%, occluded LM, 70% ost RCA stenosis, 70% diagonal stenossi, patent SVG to LCx and OM, patent LIMA to LAD, diffuse severe dx in mid to distal LAD, patent SVG to RCA-   10/14/14:  Cath:  has occluded vein grafts, patent LIMA to the LAD diagonal branch and widely patent dominant RCA. NEEDS ECHO as OutPt?  Medical therapy    4. HTN somewhat labile 146/69 to 110/53   5. HLD continue crestor but increase to 10 mg from 5? 6. CKD- stage 3-4 , will need follow up BMP  7. H/o syncope 8. V tach ? May be artifact 0910 yesterday-- MD to review 9. sm rt groin hematoma yesterday- today with ecchymosis   Pt will ambulate but he is expecting discharge. Will need outpt echo, ? Monitor to eval bradycardia.   LOS: 2 days   Time spent with pt. : 20 minutes.+ 13 (SK) [facility]  Nurse Practitioner Certified Pager [phone] after 5pm and on weekends call [phone] 10/15/2014, 8:20 AM  Will stop toprol  We'll arrange for transition of care follow-up visit.  Blood pressure may require further medication as we're stopping the beta blocker. He will also need   B-Met look for contrast nephropathy.  Monitor was reviewed, agree with LI. Artifact.  Very small groin hematoma. Recommended rest.

## 2014-10-18 MED FILL — Heparin Sodium (Porcine) 2 Unit/ML in Sodium Chloride 0.9%: INTRAMUSCULAR | Qty: 1000 | Status: AC

## 2014-10-21 ENCOUNTER — Other Ambulatory Visit: Payer: Medicare Other

## 2014-10-21 ENCOUNTER — Ambulatory Visit (HOSPITAL_COMMUNITY): Payer: Medicare Other | Attending: Cardiology

## 2014-10-21 ENCOUNTER — Other Ambulatory Visit: Payer: Self-pay

## 2014-10-21 DIAGNOSIS — I214 Non-ST elevation (NSTEMI) myocardial infarction: Secondary | ICD-10-CM | POA: Diagnosis not present

## 2014-10-21 DIAGNOSIS — I351 Nonrheumatic aortic (valve) insufficiency: Secondary | ICD-10-CM | POA: Insufficient documentation

## 2014-10-21 DIAGNOSIS — R079 Chest pain, unspecified: Secondary | ICD-10-CM | POA: Diagnosis present

## 2014-10-21 DIAGNOSIS — I340 Nonrheumatic mitral (valve) insufficiency: Secondary | ICD-10-CM | POA: Diagnosis not present

## 2014-10-21 DIAGNOSIS — N184 Chronic kidney disease, stage 4 (severe): Secondary | ICD-10-CM

## 2014-10-21 DIAGNOSIS — I25700 Atherosclerosis of coronary artery bypass graft(s), unspecified, with unstable angina pectoris: Secondary | ICD-10-CM

## 2014-10-25 ENCOUNTER — Ambulatory Visit (INDEPENDENT_AMBULATORY_CARE_PROVIDER_SITE_OTHER): Payer: Medicare Other | Admitting: Physician Assistant

## 2014-10-25 ENCOUNTER — Encounter: Payer: Self-pay | Admitting: Physician Assistant

## 2014-10-25 VITALS — BP 150/68 | HR 78 | Ht 67.0 in | Wt 159.0 lb

## 2014-10-25 DIAGNOSIS — I25810 Atherosclerosis of coronary artery bypass graft(s) without angina pectoris: Secondary | ICD-10-CM | POA: Diagnosis not present

## 2014-10-25 DIAGNOSIS — E785 Hyperlipidemia, unspecified: Secondary | ICD-10-CM | POA: Diagnosis not present

## 2014-10-25 DIAGNOSIS — I25700 Atherosclerosis of coronary artery bypass graft(s), unspecified, with unstable angina pectoris: Secondary | ICD-10-CM

## 2014-10-25 DIAGNOSIS — N189 Chronic kidney disease, unspecified: Secondary | ICD-10-CM

## 2014-10-25 DIAGNOSIS — I10 Essential (primary) hypertension: Secondary | ICD-10-CM

## 2014-10-25 LAB — BASIC METABOLIC PANEL
BUN: 35 mg/dL — ABNORMAL HIGH (ref 6–23)
CHLORIDE: 106 meq/L (ref 96–112)
CO2: 23 mEq/L (ref 19–32)
Calcium: 9.3 mg/dL (ref 8.4–10.5)
Creatinine, Ser: 1.86 mg/dL — ABNORMAL HIGH (ref 0.40–1.50)
GFR: 36.55 mL/min — AB (ref >60.00)
Glucose, Bld: 101 mg/dL — ABNORMAL HIGH (ref 70–99)
POTASSIUM: 5 meq/L (ref 3.5–5.1)
Sodium: 137 mEq/L (ref 135–145)

## 2014-10-25 NOTE — Assessment & Plan Note (Signed)
Continue Crestor 10 mg daily.

## 2014-10-25 NOTE — Assessment & Plan Note (Addendum)
Creatinine was 1.89 at discharge. We'll repeat today to make sure it stable.

## 2014-10-25 NOTE — Progress Notes (Signed)
Cardiology Office Note   Date:  10/25/2014   ID:  Scott [name], DOB 11-11-1923, MRN [id number]  PCP:  Henrine Screws, MD  Cardiologist:  Dr. Irish Lack  Chief Complaint: weak, leg pain    History of Present Illness: Scott [name] is a 81 y.o. male who presents for  Post hospital follow up. He has history of CAD status post CABG 30 years ago in [place]. He was admitted with a non-STEMI in 2007 and grafts were patent at that time but there was distal disease in the circumflex and is treated medically. He EF was 40-45% that time. He returned with a non-STEMI on 10/13/14 and underwent cardiac catheterization by Dr. [name]. He had a patent LIMA to the LAD and diagonal branch and patent dominant RCA. He had occluded vein grafts. Medical therapy was recommended. Toprol was stopped due to bradycardia. He developed a small hematoma at cath site that was stable at discharge.  Overall the patient is done well. He had a 2-D echo that shows normal LV function and grade 1 diastolic dysfunction. His creatinine was 1.89 at discharge and needs to be repeated today. He is complaining of some dyspnea on exertion but mostly weakness. He is also having trouble with his leg from the hematoma. [facility] lives [facility] with his wife.    Past Medical History  Diagnosis Date  . Hypertension   . Anxiety   . Depression   . GERD (gastroesophageal reflux disease)   . Hyperlipidemia   . CAD (coronary artery disease)     with CABG in 1983  . Iron deficiency anemia   . Erectile dysfunction   . Vasovagal syncope 05/2006  . Hx of adenomatous colonic polyps 2007  . Sigmoid diverticulosis   . Weight loss   . Urethral stricture 11/2008  . Shingles 1990's  . Inguinal hernia   . Anginal pain ~ 1992  . Claudication     BLE  . Dyspnea 2010    syndrome - extensive  . Exertional shortness of breath   . History of blood transfusion 2014  . H/O hiatal hernia   . Upper GI bleeding 2014    Gastritis  and possible duodenal ulcer seen on EGD, felt secondary to NSAIDs    Past Surgical History  Procedure Laterality Date  . Cholecystectomy  1980's  . Coronary artery bypass graft  1994     LIMA-LAD, SVG-D1, SVG-OM,  SVG-RCA; performed in [place], [place]  . Inguinal hernia repair Left   . Esophagogastroduodenoscopy N/A 03/25/2013    Procedure: ESOPHAGOGASTRODUODENOSCOPY (EGD);  Surgeon: Missy Sabins, MD;  gastritis, cannot rule out duodenal ulcer   . Cardiac catheterization  2007    Left main 100%, RCA 70% ostial, all grafts were patent, EF 40-45 percent, distal circumflex subtotal after graft insertion, medical therapy   . Cardiac catheterization N/A 10/14/2014    Procedure: Left Heart Cath and Cors/Grafts Angiography;  Surgeon: Lorretta Harp, MD;  Location: [facility];  Service: Cardiovascular;  Laterality: N/A;     Current Outpatient Prescriptions  Medication Sig Dispense Refill  . acetaminophen (TYLENOL) 325 MG tablet Take 2 tablets (650 mg total) by mouth every 4 (four) hours as needed for headache or mild pain.    [name] aspirin 81 MG tablet Take 81 mg by mouth daily.    [name] buPROPion (WELLBUTRIN) 75 MG tablet Take 37.5 mg by mouth daily.    [name] desvenlafaxine (PRISTIQ) 50 MG 24 hr tablet Take 25  mg by mouth every evening.     [name] esomeprazole (NEXIUM) 40 MG packet Take 40 mg by mouth 2 (two) times daily. 30 each 12  . ferrous sulfate (KP FERROUS SULFATE) 325 (65 FE) MG tablet Take 325 mg by mouth every other day.     . isosorbide mononitrate (IMDUR) 30 MG 24 hr tablet Take 30 mg by mouth daily.    [name] lisinopril (PRINIVIL,ZESTRIL) 10 MG tablet Take 1 tablet (10 mg total) by mouth daily. 30 tablet 6  . nitroGLYCERIN (NITROSTAT) 0.4 MG SL tablet Place 1 tablet (0.4 mg total) under the tongue every 5 (five) minutes x 3 doses as needed for chest pain. 25 tablet 4  . rosuvastatin (CRESTOR) 10 MG tablet Take 5 mg by mouth every evening.     . vitamin B-12 (CYANOCOBALAMIN) 1000 MCG  tablet Take 1,000 mcg by mouth every evening.      No current facility-administered medications for this visit.    Allergies:   Review of patient's allergies indicates no known allergies.    Social History:  The patient  reports that he has never smoked. He has never used smokeless tobacco. He reports that he drinks about 4.2 oz of alcohol per week. He reports that he does not use illicit drugs.   Family History:  The patient's    family history includes Heart failure in his brother.    ROS:  Please see the history of present illness.   Otherwise, review of systems are positive for none.   All other systems are reviewed and negative.    PHYSICAL EXAM: VS:  BP 150/68 mmHg  Pulse 78  Ht '5\' 7"'$  (1.702 m)  Wt 159 lb (72.122 kg)  BMI 24.90 kg/m2 , BMI Body mass index is 24.9 kg/(m^2). GEN: Well nourished, well developed, in no acute distress Neck: no JVD, HJR, carotid bruits, or masses Cardiac:  RRR; 2/6 systolic murmur at the left sternal border, no gallop, rubs, thrill or heave,  Respiratory:  clear to auscultation bilaterally, normal work of breathing GI: soft, nontender, nondistended, + BS MS: no deformity or atrophy Extremities: Right groin and cath site with resolving hematoma and bruising no bruit otherwise lower extremities without cyanosis, clubbing, edema, good distal pulses bilaterally.  Skin: warm and dry, no rash Neuro:  Strength and sensation are intact    EKG:  EKG is ordered today. The ekg ordered today demonstrates sinus rhythm, normal EKG   Recent Labs: 10/13/2014: TSH 2.552 10/14/2014: ALT 17 10/15/2014: BUN 22*; Creatinine 1.89*; Hemoglobin 11.6*; Platelets 124*; Potassium 4.7; Sodium 139    Lipid Panel    Component Value Date/Time   CHOL 175 10/14/2014 0314   TRIG 108 10/14/2014 0314   HDL 39* 10/14/2014 0314   CHOLHDL 4.5 10/14/2014 0314   VLDL 22 10/14/2014 0314   LDLCALC 114* 10/14/2014 0314      Wt Readings from Last 3 Encounters:  10/25/14 159  lb (72.122 kg)  10/15/14 155 lb 8 oz (70.534 kg)  05/27/13 153 lb (69.4 kg)      Other studies Reviewed: Additional studies/ records that were reviewed today include and review of the records demonstrates:  2D Echo 6/3/16Study Conclusions  - Left ventricle: The cavity size was normal. Wall thickness was   normal. Systolic function was normal. The estimated ejection   fraction was in the range of 60% to 65%. Doppler parameters are   consistent with abnormal left ventricular relaxation (grade 1   diastolic dysfunction). - Aortic valve:  AV is thickened, calcified. Peak and mean gradients   through the valve are 25 and 13 mm Hg respectively consistent   with mild aortic stenosis. There was mild regurgitation. - Mitral valve: There was mild to moderate regurgitation. - Pulmonary arteries: PA peak pressure: 38 mm Hg (S).  Cardiac catheterization 10/14/14 Conclusion      Ost LM to LM lesion, 100% stenosed.  Mid RCA lesion, 40% stenosed.  Origin to Prox Graft lesion, 100% stenosed.  Prox Graft lesion, 100% stenosed.  Prox Graft lesion, 100% stenosed.   Scott [name] is a 81 y.o. male      ASSESSMENT AND PLAN: CAD (coronary artery disease) of artery bypass graft, ALL VGs occluded, patent LIMA -->LAD, patent dominant RCA Overall patient is doing well after his non-STEMI. 2-D echo shows normal LV function. He has grade 1 diastolic dysfunction. There is no evidence of heart failure and exam. He is not interested in cardiac rehabilitation. Recommend continuing to increase his activity at home. Follow-up with Dr.Varanasi in 2 months.    Hypertension Blood pressure is up a little today. Toprol has been stopped in the hospital because of bradycardia. We'll continue to monitor.   CKD (chronic kidney disease) Creatinine was 1.89 at discharge. We'll repeat today to make sure it stable.   Hyperlipidemia Continue Crestor 10 mg daily.      Sumner Boast, PA-C   10/25/2014 12:02 PM    [facility] [address], [place], [place]  [address] Phone: [phone]; Fax: [phone]

## 2014-10-25 NOTE — Patient Instructions (Signed)
Medication Instructions:   Your physician recommends that you continue on your current medications as directed. Please refer to the Current Medication list given to you today.   Labwork:  BMET  Testing/Procedures:   Follow-Up:  WITH DR Irish Lack IN 3 MONTHS OR RECALL   Any Other Special Instructions Will Be Listed Below (If Applicable).

## 2014-10-25 NOTE — Assessment & Plan Note (Signed)
Blood pressure is up a little today. Toprol has been stopped in the hospital because of bradycardia. We'll continue to monitor.

## 2014-10-25 NOTE — Assessment & Plan Note (Signed)
Overall patient is doing well after his non-STEMI. 2-D echo shows normal LV function. He has grade 1 diastolic dysfunction. There is no evidence of heart failure and exam. He is not interested in cardiac rehabilitation. Recommend continuing to increase his activity at home. Follow-up with Dr.Varanasi in 2 months.

## 2014-10-26 ENCOUNTER — Telehealth: Payer: Self-pay | Admitting: *Deleted

## 2014-10-26 NOTE — Telephone Encounter (Signed)
-----   Message from Imogene Burn, PA-C sent at 10/26/2014  7:45 AM EDT ----- Labs stable

## 2014-11-01 NOTE — Telephone Encounter (Signed)
Spoke with pt and informed him of echo results. Pt verbalized understanding.

## 2014-11-01 NOTE — Telephone Encounter (Signed)
Follow Up  Pt states that she was instructed to call back and speak with Anderson Malta. Pt wife is the person that called. She is not sure why she was supposed to return the call. Please assist

## 2014-11-02 DIAGNOSIS — I25119 Atherosclerotic heart disease of native coronary artery with unspecified angina pectoris: Secondary | ICD-10-CM | POA: Diagnosis not present

## 2014-11-02 DIAGNOSIS — L209 Atopic dermatitis, unspecified: Secondary | ICD-10-CM | POA: Diagnosis not present

## 2014-12-21 ENCOUNTER — Telehealth: Payer: Self-pay | Admitting: Interventional Cardiology

## 2014-12-21 NOTE — Telephone Encounter (Signed)
Per pt they went through the right

## 2014-12-21 NOTE — Telephone Encounter (Signed)
Did they go through the right or left groin for the cath?  I don't think the back pain is related to the cath.

## 2014-12-21 NOTE — Telephone Encounter (Signed)
Spoke with pt and he states that since having his cath in May he has been having soreness in his left calf and lower back. Pt states that he does walk and exercise occasionally but has not been able to do it as much since cath. Pt denies any other issues and denies severe or shooting pains, says just a soreness. Pt wanted to know if ok to wait until 9/8 appt to discuss this. Will forward to Dr. Irish Lack for review and advisement.

## 2014-12-21 NOTE — Telephone Encounter (Signed)
NEwMessage  Pt called and c/o of pain in his leg and back since his procedure. Pt requested to speak w/ RN to see if it might be okay to wait until the 9/8 appt w/ Dr. Irish Lack to discuss ongoing problem. Please call back and discuss.

## 2014-12-23 NOTE — Telephone Encounter (Signed)
I doubt the pain is coming from the cath. WOuld check with PcP as well.

## 2014-12-26 NOTE — Telephone Encounter (Signed)
Pt returned call. Informed pt that Dr. Irish Lack did not feel like it was related to cath and to f/u with PCP. Pt verbalized understanding and was in agreement with this plan.

## 2014-12-26 NOTE — Telephone Encounter (Signed)
Left message to call back  

## 2015-01-26 ENCOUNTER — Ambulatory Visit (INDEPENDENT_AMBULATORY_CARE_PROVIDER_SITE_OTHER): Payer: Medicare Other | Admitting: Interventional Cardiology

## 2015-01-26 ENCOUNTER — Encounter: Payer: Self-pay | Admitting: Interventional Cardiology

## 2015-01-26 VITALS — BP 126/70 | HR 69 | Ht 67.0 in | Wt 158.0 lb

## 2015-01-26 DIAGNOSIS — E785 Hyperlipidemia, unspecified: Secondary | ICD-10-CM | POA: Diagnosis not present

## 2015-01-26 DIAGNOSIS — I25700 Atherosclerosis of coronary artery bypass graft(s), unspecified, with unstable angina pectoris: Secondary | ICD-10-CM

## 2015-01-26 DIAGNOSIS — I10 Essential (primary) hypertension: Secondary | ICD-10-CM | POA: Diagnosis not present

## 2015-01-26 DIAGNOSIS — I25709 Atherosclerosis of coronary artery bypass graft(s), unspecified, with unspecified angina pectoris: Secondary | ICD-10-CM

## 2015-01-26 DIAGNOSIS — D649 Anemia, unspecified: Secondary | ICD-10-CM

## 2015-01-26 NOTE — Patient Instructions (Signed)
Medication Instructions:   Your physician recommends that you continue on your current medications as directed. Please refer to the Current Medication list given to you today.    Labwork:  NONE ORDER TODAY    Testing/Procedures:  NONE ORDER TODAY    Follow-Up:  Your physician wants you to follow-up in:  IN  6  MONTHS WITH DR  Irish Lack You will receive a reminder letter in the mail two months in advance. If you don't receive a letter, please call our office to schedule the follow-up appointment.     Any Other Special Instructions Will Be Listed Below (If Applicable).

## 2015-01-26 NOTE — Progress Notes (Signed)
Patient ID: Scott [name], male   DOB: 1924/02/11, 81 y.o.   MRN: [id number]     Cardiology Office Note   Date:  01/26/2015   ID:  Scott [name], DOB 07/08/1923, MRN [id number]  PCP:  Scott Screws, MD    No chief complaint on file.  F/u CAD  Wt Readings from Last 3 Encounters:  01/26/15 158 lb (71.668 kg)  10/25/14 159 lb (72.122 kg)  10/15/14 155 lb 8 oz (70.534 kg)       History of Present Illness: Scott [name] is a 81 y.o. male  Who has history of CAD status [name] CABG in 2 in [place], [place]. He was admitted with a non-STEMI in 2007 and grafts were patent at that time but there was distal disease in the circumflex and is treated medically. He EF was 40-45% that time. He returned with a non-STEMI on 10/13/14 and underwent cardiac catheterization by Dr. [name]. He had a patent LIMA to the LAD and diagonal branch and patent dominant RCA. He had occluded vein grafts. Medical therapy was recommended. Toprol was stopped due to bradycardia. He developed a small hematoma at cath site that was stable at discharge.   After discharge from the hospital, he had persistent leg and back pain. This is improved significantly. Overall, he feels well. He is walking with a cane but denies any chest discomfort or significant shortness of breath. He stays at friend's home.    Past Medical History  Diagnosis Date  . Hypertension   . Anxiety   . Depression   . GERD (gastroesophageal reflux disease)   . Hyperlipidemia   . CAD (coronary artery disease)     with CABG in 1983  . Iron deficiency anemia   . Erectile dysfunction   . Vasovagal syncope 05/2006  . Hx of adenomatous colonic polyps 2007  . Sigmoid diverticulosis   . Weight loss   . Urethral stricture 11/2008  . Shingles 1990's  . Inguinal hernia   . Anginal pain ~ 1992  . Claudication     BLE  . Dyspnea 2010    syndrome - extensive  . Exertional shortness of breath   . History of blood transfusion 2014  .  H/O hiatal hernia   . Upper GI bleeding 2014    Gastritis and possible duodenal ulcer seen on EGD, felt secondary to NSAIDs    Past Surgical History  Procedure Laterality Date  . Cholecystectomy  1980's  . Coronary artery bypass graft  1994     LIMA-LAD, SVG-D1, SVG-OM,  SVG-RCA; performed in [place], [place]  . Inguinal hernia repair Left   . Esophagogastroduodenoscopy N/A 03/25/2013    Procedure: ESOPHAGOGASTRODUODENOSCOPY (EGD);  Surgeon: Missy Sabins, MD;  gastritis, cannot rule out duodenal ulcer   . Cardiac catheterization  2007    Left main 100%, RCA 70% ostial, all grafts were patent, EF 40-45 percent, distal circumflex subtotal after graft insertion, medical therapy   . Cardiac catheterization N/A 10/14/2014    Procedure: Left Heart Cath and Cors/Grafts Angiography;  Surgeon: Lorretta Harp, MD;  Location: [facility];  Service: Cardiovascular;  Laterality: N/A;     Current Outpatient Prescriptions  Medication Sig Dispense Refill  . acetaminophen (TYLENOL) 325 MG tablet Take 2 tablets (650 mg total) by mouth every 4 (four) hours as needed for headache or mild pain.    [name] aspirin 81 MG tablet Take 81 mg by mouth daily.    [name] buPROPion (WELLBUTRIN) 75  MG tablet Take 37.5 mg by mouth daily.    [name] desvenlafaxine (PRISTIQ) 50 MG 24 hr tablet Take 25 mg by mouth every evening.     [name] esomeprazole (NEXIUM) 40 MG packet Take 40 mg by mouth 2 (two) times daily. 30 each 12  . ferrous sulfate (KP FERROUS SULFATE) 325 (65 FE) MG tablet Take 325 mg by mouth every other day.     . isosorbide mononitrate (IMDUR) 30 MG 24 hr tablet Take 30 mg by mouth daily.    [name] lisinopril (PRINIVIL,ZESTRIL) 10 MG tablet Take 1 tablet (10 mg total) by mouth daily. 30 tablet 6  . nitroGLYCERIN (NITROSTAT) 0.4 MG SL tablet Place 1 tablet (0.4 mg total) under the tongue every 5 (five) minutes x 3 doses as needed for chest pain. 25 tablet 4  . rosuvastatin (CRESTOR) 10 MG tablet Take 5 mg by mouth every  evening.     . vitamin B-12 (CYANOCOBALAMIN) 1000 MCG tablet Take 1,000 mcg by mouth every evening.      No current facility-administered medications for this visit.    Allergies:   Review of patient's allergies indicates no known allergies.    Social History:  The patient  reports that he has never smoked. He has never used smokeless tobacco. He reports that he drinks about 4.2 oz of alcohol per week. He reports that he does not use illicit drugs.   Family History:  The patient's family history includes Heart failure in his brother.    ROS:  Please see the history of present illness.   Otherwise, review of systems are positive for  Mild joint pain.   All other systems are reviewed and negative.    PHYSICAL EXAM: VS:  BP 126/70 mmHg  Pulse 69  Ht '5\' 7"'$  (1.702 m)  Wt 158 lb (71.668 kg)  BMI 24.74 kg/m2  SpO2 97% , BMI Body mass index is 24.74 kg/(m^2). GEN: Well nourished, well developed, in no acute distress HEENT: normal Neck: no JVD, carotid bruits, or masses Cardiac: RRR; no rubs, or gallops,no edema  Respiratory:  clear to auscultation bilaterally, normal work of breathing GI: soft, nontender, nondistended, + BS MS: no deformity or atrophy Skin: warm and dry, no rash Neuro:  Strength and sensation are intact Psych: euthymic mood, full affect     Recent Labs: 10/13/2014: TSH 2.552 10/14/2014: ALT 17 10/15/2014: Hemoglobin 11.6*; Platelets 124* 10/25/2014: BUN 35*; Creatinine, Ser 1.86*; Potassium 5.0; Sodium 137   Lipid Panel    Component Value Date/Time   CHOL 175 10/14/2014 0314   TRIG 108 10/14/2014 0314   HDL 39* 10/14/2014 0314   CHOLHDL 4.5 10/14/2014 0314   VLDL 22 10/14/2014 0314   LDLCALC 114* 10/14/2014 0314     Other studies Reviewed: Additional studies/ records that were reviewed today with results demonstrating:  Cath report reviewed.   ASSESSMENT AND PLAN:  1.  coronary artery disease : graft disease noted at the cath in 2016. Occluded SVG to  RCA and occluded SVG to circumflex. He has flown his native RCA which was thought to be adequate. Continue medical management at this point. No anginal symptoms on current medical therapy. 2.  hypertension: Well controlled. Continue current blood pressure medications. 3.  hyperlipidemia: LDL was 114 at last check. This is above target but he has had difficulty tolerating any higher doses of Crestor. Continue current dose of lipid-lowering therapy. 4.  anemia  From GI bleeding in the past: This is been an issue for  him in the past. Not having symptoms of anemia at this time. It does limit how aggressive we can be with anticoagulation and antiplatelet therapy.   Current medicines are reviewed at length with the patient today.  The patient concerns regarding his medicines were addressed.  The following changes have been made:  No change  Labs/ tests ordered today include:  No orders of the defined types were placed in this encounter.    Recommend 150 minutes/week of aerobic exercise Low fat, low carb, high fiber diet recommended  Disposition:   FU in 6 months   Teresita Madura., MD  01/26/2015 12:18 PM    [facility] [address], [place], [place]  [address] Phone: [phone]; Fax: [phone]

## 2015-02-16 DIAGNOSIS — Z23 Encounter for immunization: Secondary | ICD-10-CM | POA: Diagnosis not present

## 2015-03-21 DIAGNOSIS — G629 Polyneuropathy, unspecified: Secondary | ICD-10-CM | POA: Diagnosis not present

## 2015-04-17 DIAGNOSIS — D509 Iron deficiency anemia, unspecified: Secondary | ICD-10-CM | POA: Diagnosis not present

## 2015-04-17 DIAGNOSIS — E559 Vitamin D deficiency, unspecified: Secondary | ICD-10-CM | POA: Diagnosis not present

## 2015-04-17 DIAGNOSIS — Z0001 Encounter for general adult medical examination with abnormal findings: Secondary | ICD-10-CM | POA: Diagnosis not present

## 2015-04-17 DIAGNOSIS — E538 Deficiency of other specified B group vitamins: Secondary | ICD-10-CM | POA: Diagnosis not present

## 2015-04-17 DIAGNOSIS — G629 Polyneuropathy, unspecified: Secondary | ICD-10-CM | POA: Diagnosis not present

## 2015-04-17 DIAGNOSIS — Z1389 Encounter for screening for other disorder: Secondary | ICD-10-CM | POA: Diagnosis not present

## 2015-04-17 DIAGNOSIS — L820 Inflamed seborrheic keratosis: Secondary | ICD-10-CM | POA: Diagnosis not present

## 2015-04-17 DIAGNOSIS — E782 Mixed hyperlipidemia: Secondary | ICD-10-CM | POA: Diagnosis not present

## 2015-04-17 DIAGNOSIS — F329 Major depressive disorder, single episode, unspecified: Secondary | ICD-10-CM | POA: Diagnosis not present

## 2015-04-17 DIAGNOSIS — I25119 Atherosclerotic heart disease of native coronary artery with unspecified angina pectoris: Secondary | ICD-10-CM | POA: Diagnosis not present

## 2015-04-17 DIAGNOSIS — I10 Essential (primary) hypertension: Secondary | ICD-10-CM | POA: Diagnosis not present

## 2015-04-17 DIAGNOSIS — N183 Chronic kidney disease, stage 3 (moderate): Secondary | ICD-10-CM | POA: Diagnosis not present

## 2015-06-26 DIAGNOSIS — R05 Cough: Secondary | ICD-10-CM | POA: Diagnosis not present

## 2015-07-31 ENCOUNTER — Other Ambulatory Visit: Payer: Self-pay

## 2015-07-31 MED ORDER — LISINOPRIL 10 MG PO TABS: 10.0000 mg | ORAL_TABLET | Freq: Every day | ORAL | Status: DC

## 2015-08-02 ENCOUNTER — Encounter: Payer: Self-pay | Admitting: Interventional Cardiology

## 2015-08-02 ENCOUNTER — Ambulatory Visit (INDEPENDENT_AMBULATORY_CARE_PROVIDER_SITE_OTHER): Payer: Medicare Other | Admitting: Interventional Cardiology

## 2015-08-02 VITALS — BP 150/70 | HR 70 | Ht 67.0 in | Wt 160.8 lb

## 2015-08-02 DIAGNOSIS — I10 Essential (primary) hypertension: Secondary | ICD-10-CM

## 2015-08-02 DIAGNOSIS — I25810 Atherosclerosis of coronary artery bypass graft(s) without angina pectoris: Secondary | ICD-10-CM | POA: Diagnosis not present

## 2015-08-02 DIAGNOSIS — E785 Hyperlipidemia, unspecified: Secondary | ICD-10-CM

## 2015-08-02 DIAGNOSIS — I25709 Atherosclerosis of coronary artery bypass graft(s), unspecified, with unspecified angina pectoris: Secondary | ICD-10-CM | POA: Diagnosis not present

## 2015-08-02 NOTE — Progress Notes (Signed)
Patient ID: Scott [name], male   DOB: 1923/08/13, 82 y.o.   MRN: [id number]     Cardiology Office Note   Date:  08/02/2015   ID:  Scott [name], DOB 27-Jul-1923, MRN [id number]  PCP:  Henrine Screws, MD    No chief complaint on file. f/u CAD   Wt Readings from Last 3 Encounters:  08/02/15 160 lb 12.8 oz (72.938 kg)  01/26/15 158 lb (71.668 kg)  10/25/14 159 lb (72.122 kg)       History of Present Illness: Scott [name] is a 82 y.o. male  Who has history of CAD status [name] CABG in 75 in [place], [place]. He was admitted with a non-STEMI in 2007 and grafts were patent at that time but there was distal disease in the circumflex and is treated medically. He EF was 40-45% that time. He returned with a non-STEMI on 10/13/14 and underwent cardiac catheterization by Dr. [name]. He had a patent LIMA to the LAD and diagonal branch and patent dominant RCA. He had occluded vein grafts. Medical therapy was recommended. Toprol was stopped due to bradycardia.   He walks regularly, nearly daily for 30 minutes (about 0.5 miles) and does not have any chest discomfort.  A few days ago, He had some abdominal pain for a day after eating something that did not agree with him.  He is back to normal now.  He is eating regularly and tolerating his medicines.  No bleeding problems.  He has some leg pains.  He uses a cane.  NO recent falls.    Past Medical History  Diagnosis Date  . Hypertension   . Anxiety   . Depression   . GERD (gastroesophageal reflux disease)   . Hyperlipidemia   . CAD (coronary artery disease)     with CABG in 1983  . Iron deficiency anemia   . Erectile dysfunction   . Vasovagal syncope 05/2006  . Hx of adenomatous colonic polyps 2007  . Sigmoid diverticulosis   . Weight loss   . Urethral stricture 11/2008  . Shingles 1990's  . Inguinal hernia   . Anginal pain ([place]) ~ 1992  . Claudication ([place])     BLE  . Dyspnea 2010    syndrome - extensive  .  Exertional shortness of breath   . History of blood transfusion 2014  . H/O hiatal hernia   . Upper GI bleeding 2014    Gastritis and possible duodenal ulcer seen on EGD, felt secondary to NSAIDs    Past Surgical History  Procedure Laterality Date  . Cholecystectomy  1980's  . Coronary artery bypass graft  1994     LIMA-LAD, SVG-D1, SVG-OM,  SVG-RCA; performed in [place], [place]  . Inguinal hernia repair Left   . Esophagogastroduodenoscopy N/A 03/25/2013    Procedure: ESOPHAGOGASTRODUODENOSCOPY (EGD);  Surgeon: Missy Sabins, MD;  gastritis, cannot rule out duodenal ulcer   . Cardiac catheterization  2007    Left main 100%, RCA 70% ostial, all grafts were patent, EF 40-45 percent, distal circumflex subtotal after graft insertion, medical therapy   . Cardiac catheterization N/A 10/14/2014    Procedure: Left Heart Cath and Cors/Grafts Angiography;  Surgeon: Lorretta Harp, MD;  Location: [facility];  Service: Cardiovascular;  Laterality: N/A;     Current Outpatient Prescriptions  Medication Sig Dispense Refill  . acetaminophen (TYLENOL) 325 MG tablet Take 2 tablets (650 mg total) by mouth every 4 (four) hours as needed for headache or  mild pain.    [name] aspirin 81 MG tablet Take 81 mg by mouth daily.    [name] buPROPion (WELLBUTRIN) 75 MG tablet Take 37.5 mg by mouth daily.    [name] desvenlafaxine (PRISTIQ) 50 MG 24 hr tablet Take 25 mg by mouth every evening.     [name] esomeprazole (NEXIUM) 40 MG packet Take 40 mg by mouth 2 (two) times daily. 30 each 12  . ferrous sulfate (KP FERROUS SULFATE) 325 (65 FE) MG tablet Take 325 mg by mouth every other day.     . isosorbide mononitrate (IMDUR) 30 MG 24 hr tablet Take 30 mg by mouth daily.    [name] lisinopril (PRINIVIL,ZESTRIL) 10 MG tablet Take 1 tablet (10 mg total) by mouth daily. 90 tablet 1  . nitroGLYCERIN (NITROSTAT) 0.4 MG SL tablet Place 1 tablet (0.4 mg total) under the tongue every 5 (five) minutes x 3 doses as needed for chest pain. 25  tablet 4  . PROAIR HFA 108 (90 Base) MCG/ACT inhaler TAKE 2 PUFFS EVERY 6 HOURS AS NEEDED FOR WHEEZING  0  . rosuvastatin (CRESTOR) 10 MG tablet Take 5 mg by mouth every evening.     . vitamin B-12 (CYANOCOBALAMIN) 1000 MCG tablet Take 1,000 mcg by mouth every evening.      No current facility-administered medications for this visit.    Allergies:   Review of patient's allergies indicates no known allergies.    Social History:  The patient  reports that he has never smoked. He has never used smokeless tobacco. He reports that he drinks about 4.2 oz of alcohol per week. He reports that he does not use illicit drugs.   Family History:  The patient's family history includes Heart failure in his brother; Hypertension in his mother. There is no history of Heart attack or Stroke.    ROS:  Please see the history of present illness.   Otherwise, review of systems are positive for leg pain, needs a cane.   All other systems are reviewed and negative.    PHYSICAL EXAM: VS:  BP 150/70 mmHg  Pulse 70  Ht '5\' 7"'$  (1.702 m)  Wt 160 lb 12.8 oz (72.938 kg)  BMI 25.18 kg/m2 , BMI Body mass index is 25.18 kg/(m^2). GEN: Well nourished, well developed, in no acute distress HEENT: normal Neck: no JVD, carotid bruits, or masses Cardiac: RRR; no murmurs, rubs, or gallops,no edema  Respiratory:  clear to auscultation bilaterally, normal work of breathing GI: soft, nontender, nondistended, + BS MS: no deformity or atrophy Skin: warm and dry, no rash Neuro:  Strength and sensation are intact Psych: euthymic mood, full affect    Recent Labs: 10/13/2014: TSH 2.552 10/14/2014: ALT 17 10/15/2014: Hemoglobin 11.6*; Platelets 124* 10/25/2014: BUN 35*; Creatinine, Ser 1.86*; Potassium 5.0; Sodium 137   Lipid Panel    Component Value Date/Time   CHOL 175 10/14/2014 0314   TRIG 108 10/14/2014 0314   HDL 39* 10/14/2014 0314   CHOLHDL 4.5 10/14/2014 0314   VLDL 22 10/14/2014 0314   LDLCALC 114* 10/14/2014  0314     Other studies Reviewed: Additional studies/ records that were reviewed today with results demonstrating: cath results as above.   ASSESSMENT AND PLAN:  1. coronary artery disease : Continue aspirin and imdur for CAD sx.graft disease noted at the cath in 2016. Occluded SVG to RCA and occluded SVG to circumflex. He has flow in his native RCA which was thought to be adequate. Continue medical management at this point.  No anginal symptoms on current medical therapy.  Tolerating meds well.  No bleeding issues.  Walk as much as possible. 2. hypertension: Well controlled. Continue current blood pressure medications.  Follow up with Dr. Inda Merlin later this month.  3. hyperlipidemia: LDL was 114 at last check. This is above target but he has had difficulty tolerating any higher doses of Crestor. Continue current dose of lipid-lowering therapy. 4. anemia From GI bleeding in the past: This is been an issue for him in the past. Not having symptoms of anemia at this time. It does limit how aggressive we can be with anticoagulation and antiplatelet therapy.Tolerating baby aspirin.   Current medicines are reviewed at length with the patient today.  The patient concerns regarding his medicines were addressed.  The following changes have been made:  No change  Labs/ tests ordered today include: none No orders of the defined types were placed in this encounter.    Recommend 150 minutes/week of aerobic exercise Low fat, low carb, high fiber diet recommended  Disposition:   FU in 1 year   Teresita Madura., MD  08/02/2015 2:01 [facility] [place], [place], [place]  [address] Phone: [phone]; Fax: [phone]

## 2015-08-02 NOTE — Patient Instructions (Signed)

## 2015-08-17 DIAGNOSIS — R112 Nausea with vomiting, unspecified: Secondary | ICD-10-CM | POA: Diagnosis not present

## 2015-08-17 DIAGNOSIS — K529 Noninfective gastroenteritis and colitis, unspecified: Secondary | ICD-10-CM | POA: Diagnosis not present

## 2015-10-31 DIAGNOSIS — M79644 Pain in right finger(s): Secondary | ICD-10-CM | POA: Diagnosis not present

## 2016-01-16 ENCOUNTER — Encounter (HOSPITAL_COMMUNITY): Payer: Self-pay

## 2016-01-16 ENCOUNTER — Emergency Department (HOSPITAL_COMMUNITY): Payer: Medicare Other

## 2016-01-16 ENCOUNTER — Emergency Department (HOSPITAL_COMMUNITY)
Admission: EM | Admit: 2016-01-16 | Discharge: 2016-01-16 | Disposition: A | Discharge status: Home / Self Care | Payer: Medicare Other | Attending: Emergency Medicine | Admitting: Emergency Medicine

## 2016-01-16 DIAGNOSIS — M545 Low back pain, unspecified: Secondary | ICD-10-CM

## 2016-01-16 DIAGNOSIS — K449 Diaphragmatic hernia without obstruction or gangrene: Secondary | ICD-10-CM | POA: Diagnosis not present

## 2016-01-16 DIAGNOSIS — I252 Old myocardial infarction: Secondary | ICD-10-CM | POA: Insufficient documentation

## 2016-01-16 DIAGNOSIS — Z79899 Other long term (current) drug therapy: Secondary | ICD-10-CM | POA: Diagnosis not present

## 2016-01-16 DIAGNOSIS — Z7982 Long term (current) use of aspirin: Secondary | ICD-10-CM | POA: Insufficient documentation

## 2016-01-16 DIAGNOSIS — I129 Hypertensive chronic kidney disease with stage 1 through stage 4 chronic kidney disease, or unspecified chronic kidney disease: Secondary | ICD-10-CM | POA: Diagnosis not present

## 2016-01-16 DIAGNOSIS — N390 Urinary tract infection, site not specified: Secondary | ICD-10-CM | POA: Insufficient documentation

## 2016-01-16 DIAGNOSIS — N189 Chronic kidney disease, unspecified: Secondary | ICD-10-CM | POA: Diagnosis not present

## 2016-01-16 DIAGNOSIS — I2510 Atherosclerotic heart disease of native coronary artery without angina pectoris: Secondary | ICD-10-CM | POA: Insufficient documentation

## 2016-01-16 DIAGNOSIS — Z951 Presence of aortocoronary bypass graft: Secondary | ICD-10-CM | POA: Diagnosis not present

## 2016-01-16 DIAGNOSIS — R109 Unspecified abdominal pain: Secondary | ICD-10-CM | POA: Diagnosis not present

## 2016-01-16 DIAGNOSIS — M25551 Pain in right hip: Secondary | ICD-10-CM | POA: Diagnosis not present

## 2016-01-16 DIAGNOSIS — R52 Pain, unspecified: Secondary | ICD-10-CM

## 2016-01-16 DIAGNOSIS — M25552 Pain in left hip: Secondary | ICD-10-CM | POA: Diagnosis not present

## 2016-01-16 LAB — URINALYSIS, ROUTINE W REFLEX MICROSCOPIC
Glucose, UA: NEGATIVE mg/dL
KETONES UR: 15 mg/dL — AB
NITRITE: POSITIVE — AB
PROTEIN: 100 mg/dL — AB
Specific Gravity, Urine: 1.025 (ref 1.005–1.030)
pH: 5.5 (ref 5.0–8.0)

## 2016-01-16 LAB — CBC WITH DIFFERENTIAL/PLATELET
Basophils Absolute: 0 10*3/uL (ref 0.0–0.1)
Basophils Relative: 0 %
Eosinophils Absolute: 0.2 10*3/uL (ref 0.0–0.7)
Eosinophils Relative: 2 %
HEMATOCRIT: 40.4 % (ref 39.0–52.0)
Hemoglobin: 13.3 g/dL (ref 13.0–17.0)
Lymphocytes Relative: 12 %
Lymphs Abs: 1.2 10*3/uL (ref 0.7–4.0)
MCH: 30.2 pg (ref 26.0–34.0)
MCHC: 32.9 g/dL (ref 30.0–36.0)
MCV: 91.8 fL (ref 78.0–100.0)
MONO ABS: 0.8 10*3/uL (ref 0.1–1.0)
Monocytes Relative: 8 %
NEUTROS ABS: 7.8 10*3/uL — AB (ref 1.7–7.7)
Neutrophils Relative %: 78 %
PLATELETS: 167 10*3/uL (ref 150–400)
RBC: 4.4 MIL/uL (ref 4.22–5.81)
RDW: 13.4 % (ref 11.5–15.5)
WBC: 10 10*3/uL (ref 4.0–10.5)

## 2016-01-16 LAB — BASIC METABOLIC PANEL
ANION GAP: 8 (ref 5–15)
BUN: 32 mg/dL — ABNORMAL HIGH (ref 6–20)
CALCIUM: 8.8 mg/dL — AB (ref 8.9–10.3)
CO2: 20 mmol/L — AB (ref 22–32)
Chloride: 109 mmol/L (ref 101–111)
Creatinine, Ser: 1.68 mg/dL — ABNORMAL HIGH (ref 0.61–1.24)
GFR calc Af Amer: 40 mL/min — ABNORMAL LOW (ref >60)
GFR, EST NON AFRICAN AMERICAN: 34 mL/min — AB (ref >60)
Glucose, Bld: 107 mg/dL — ABNORMAL HIGH (ref 65–99)
Potassium: 4.4 mmol/L (ref 3.5–5.1)
Sodium: 137 mmol/L (ref 135–145)

## 2016-01-16 LAB — URINE MICROSCOPIC-ADD ON

## 2016-01-16 MED ORDER — DEXTROSE 5 % IV SOLN
1.0000 g | Freq: Once | INTRAVENOUS | Status: AC
  Administered 2016-01-16: 1 g via INTRAVENOUS
  Filled 2016-01-16: qty 10

## 2016-01-16 MED ORDER — ONDANSETRON HCL 4 MG/2ML IJ SOLN
4.0000 mg | Freq: Once | INTRAMUSCULAR | Status: AC
  Administered 2016-01-16: 4 mg via INTRAVENOUS
  Filled 2016-01-16: qty 2

## 2016-01-16 MED ORDER — TRAMADOL HCL 50 MG PO TABS: 25.0000 mg | ORAL_TABLET | Freq: Four times a day (QID) | ORAL | 0 refills | Status: DC | PRN

## 2016-01-16 MED ORDER — CEPHALEXIN 500 MG PO CAPS: 1000.0000 mg | ORAL_CAPSULE | Freq: Two times a day (BID) | ORAL | 0 refills | Status: DC

## 2016-01-16 MED ORDER — MORPHINE SULFATE (PF) 2 MG/ML IV SOLN
2.0000 mg | Freq: Once | INTRAVENOUS | Status: AC
Start: 2016-01-16 — End: 2016-01-16
  Administered 2016-01-16: 2 mg via INTRAVENOUS
  Filled 2016-01-16: qty 1

## 2016-01-16 NOTE — ED Triage Notes (Signed)
Pt complaining of bilateral lower leg and bilateral hip pain since this evening. Pt denies any falls/trauma.

## 2016-01-16 NOTE — ED Provider Notes (Signed)
[facility] DEPT Provider Note   CSN: [id number] Arrival date & time: 01/16/16  5597  By signing my name below, I, Emmanuella Mensah, attest that this documentation has been prepared under the direction and in the presence of Orpah Greek, MD. Electronically Signed: Judithann Sauger, ED Scribe. 01/16/16. 3:51 AM.   History   Chief Complaint Chief Complaint  Patient presents with  . Leg Pain  . Hip Pain   HPI Comments: Scott [name] is a 82 y.o. male who presents to the Emergency Department complaining of gradually worsening moderate left lower back pain onset yesterday night. He notes that his pain began in his legs. No alleviating factors noted. Pt has not tried any medications PTA. He denies any recent falls, injuries, or trauma. No fever, chills, generalized rash, or any open wounds.    The history is provided by the patient. No language interpreter was used.    Past Medical History:  Diagnosis Date  . Anginal pain ([place]) ~ 1992  . Anxiety   . CAD (coronary artery disease)    with CABG in 1983  . Claudication ([place])    BLE  . Depression   . Dyspnea 2010   syndrome - extensive  . Erectile dysfunction   . Exertional shortness of breath   . GERD (gastroesophageal reflux disease)   . H/O hiatal hernia   . History of blood transfusion 2014  . Hx of adenomatous colonic polyps 2007  . Hyperlipidemia   . Hypertension   . Inguinal hernia   . Iron deficiency anemia   . Shingles 1990's  . Sigmoid diverticulosis   . Upper GI bleeding 2014   Gastritis and possible duodenal ulcer seen on EGD, felt secondary to NSAIDs  . Urethral stricture 11/2008  . Vasovagal syncope 05/2006  . Weight loss     Patient Active Problem List   Diagnosis Date Noted  . Bradycardia, toprol stopped 10/15/2014  . CAD (coronary artery disease) of artery bypass graft, ALL VGs occluded, patent LIMA -->LAD, patent dominant RCA 10/15/2014  . Elevated troponin 10/13/2014  . Non-STEMI  (non-ST elevated myocardial infarction) ([place]) 10/13/2014  . NSTEMI (non-ST elevated myocardial infarction) ([place])   . GI bleed 03/24/2013  . Anemia 03/24/2013  . CKD (chronic kidney disease) 03/24/2013  . Syncope 08/15/2012  . Orthostasis 08/15/2012  . AKI (acute kidney injury) ([place]) 08/15/2012  . Dehydration 08/15/2012  . Thrombocytopenia ([place]) 08/15/2012  . Hypertension   . Anxiety   . Depression   . GERD (gastroesophageal reflux disease)   . Hyperlipidemia   . CAD (coronary artery disease)   . HIATAL HERNIA 09/21/2008  . DYSPNEA 09/21/2008  . Nonspecific (abnormal) findings on radiological and other examination of body structure 09/21/2008  . CT, CHEST, ABNORMAL 09/21/2008    Past Surgical History:  Procedure Laterality Date  . CARDIAC CATHETERIZATION  2007   Left main 100%, RCA 70% ostial, all grafts were patent, EF 40-45 percent, distal circumflex subtotal after graft insertion, medical therapy   . CARDIAC CATHETERIZATION N/A 10/14/2014   Procedure: Left Heart Cath and Cors/Grafts Angiography;  Surgeon: Lorretta Harp, MD;  Location: [facility];  Service: Cardiovascular;  Laterality: N/A;  . CHOLECYSTECTOMY  1980's  . CORONARY ARTERY BYPASS GRAFT  1994    LIMA-LAD, SVG-D1, SVG-OM,  SVG-RCA; performed in [place], [place]  . ESOPHAGOGASTRODUODENOSCOPY N/A 03/25/2013   Procedure: ESOPHAGOGASTRODUODENOSCOPY (EGD);  Surgeon: Missy Sabins, MD;  gastritis, cannot rule out duodenal ulcer   . INGUINAL HERNIA  REPAIR Left        Home Medications    Prior to Admission medications   Medication Sig Start Date End Date Taking? Authorizing Provider  acetaminophen (TYLENOL) 325 MG tablet Take 2 tablets (650 mg total) by mouth every 4 (four) hours as needed for headache or mild pain. 10/15/14  Yes Isaiah Serge, NP  aspirin 81 MG tablet Take 81 mg by mouth daily.   Yes Historical Provider, MD  buPROPion (WELLBUTRIN) 75 MG tablet Take 37.5 mg by mouth daily.   Yes  Historical Provider, MD  desvenlafaxine (PRISTIQ) 50 MG 24 hr tablet Take 25 mg by mouth every evening.    Yes Historical Provider, MD  ferrous sulfate (KP FERROUS SULFATE) 325 (65 FE) MG tablet Take 162.5 mg by mouth daily with breakfast.    Yes Historical Provider, MD  isosorbide mononitrate (IMDUR) 30 MG 24 hr tablet Take 30 mg by mouth daily.   Yes Historical Provider, MD  lisinopril (PRINIVIL,ZESTRIL) 10 MG tablet Take 1 tablet (10 mg total) by mouth daily. 07/31/15  Yes Jettie Booze, MD  nitroGLYCERIN (NITROSTAT) 0.4 MG SL tablet Place 1 tablet (0.4 mg total) under the tongue every 5 (five) minutes x 3 doses as needed for chest pain. 10/15/14  Yes Isaiah Serge, NP  PROAIR HFA 108 [phone] Base) MCG/ACT inhaler TAKE 2 PUFFS EVERY 6 HOURS AS NEEDED FOR WHEEZING 06/26/15  Yes Historical Provider, MD  rosuvastatin (CRESTOR) 10 MG tablet Take 5 mg by mouth every evening.    Yes Historical Provider, MD  vitamin B-12 (CYANOCOBALAMIN) 1000 MCG tablet Take 1,000 mcg by mouth every evening.    Yes Historical Provider, MD  esomeprazole (NEXIUM) 40 MG packet Take 40 mg by mouth 2 (two) times daily. Patient not taking: Reported on 01/16/2016 03/26/13   Lavone Orn, MD    Family History Family History  Problem Relation Age of Onset  . Hypertension Mother   . Heart failure Brother   . Heart attack Neg Hx   . Stroke Neg Hx     Social History Social History  Substance Use Topics  . Smoking status: Never Smoker  . Smokeless tobacco: Never Used  . Alcohol use 4.2 oz/week    7 Glasses of wine per week     Comment: wine - daily     Allergies   Review of patient's allergies indicates no known allergies.   Review of Systems Review of Systems  Constitutional: Negative for chills and fever.  Musculoskeletal: Positive for back pain and myalgias.  Skin: Negative for rash and wound.  All other systems reviewed and are negative.    Physical Exam Updated Vital Signs BP 148/71   Pulse 63    Temp 97.4 F (36.3 C) (Oral)   Resp 17   SpO2 95%   Physical Exam  Constitutional: He is oriented to person, place, and time. He appears well-developed and well-nourished. No distress.  HENT:  Head: Normocephalic and atraumatic.  Right Ear: Hearing normal.  Left Ear: Hearing normal.  Nose: Nose normal.  Mouth/Throat: Oropharynx is clear and moist and mucous membranes are normal.  Eyes: Conjunctivae and EOM are normal. Pupils are equal, round, and reactive to light.  Neck: Normal range of motion. Neck supple.  Cardiovascular: Regular rhythm, S1 normal and S2 normal.  Exam reveals no gallop and no friction rub.   No murmur heard. Pulmonary/Chest: Effort normal and breath sounds normal. No respiratory distress. He exhibits no tenderness.  Abdominal: Soft. Normal  appearance and bowel sounds are normal. There is no hepatosplenomegaly. There is no tenderness. There is no rebound, no guarding, no tenderness at McBurney's point and negative Murphy's sign. No hernia.  Musculoskeletal: Normal range of motion.  Left lower paraspinal tenderness Mild tenderness with straight leg raise  Neurological: He is alert and oriented to person, place, and time. He has normal strength. No cranial nerve deficit or sensory deficit. Coordination normal. GCS eye subscore is 4. GCS verbal subscore is 5. GCS motor subscore is 6.  Skin: Skin is warm, dry and intact. No rash noted. No cyanosis.  Psychiatric: He has a normal mood and affect. His speech is normal and behavior is normal. Thought content normal.  Nursing note and vitals reviewed.    ED Treatments / Results  DIAGNOSTIC STUDIES: Oxygen Saturation is 95% on RA, adequate by my interpretation.    COORDINATION OF CARE: 3:49 AM- Pt advised of plan for treatment and pt agrees. He will receive bilateral hip x-ray for further evaluation.   Labs (all labs ordered are listed, but only abnormal results are displayed) Labs Reviewed  CBC WITH  DIFFERENTIAL/PLATELET - Abnormal; Notable for the following:       Result Value   Neutro Abs 7.8 (*)    All other components within normal limits  BASIC METABOLIC PANEL - Abnormal; Notable for the following:    CO2 20 (*)    Glucose, Bld 107 (*)    BUN 32 (*)    Creatinine, Ser 1.68 (*)    Calcium 8.8 (*)    GFR calc non Af Amer 34 (*)    GFR calc Af Amer 40 (*)    All other components within normal limits  URINALYSIS, ROUTINE W REFLEX MICROSCOPIC (NOT AT [facility]) - Abnormal; Notable for the following:    Color, Urine BROWN (*)    APPearance TURBID (*)    Hgb urine dipstick LARGE (*)    Bilirubin Urine MODERATE (*)    Ketones, ur 15 (*)    Protein, ur 100 (*)    Nitrite POSITIVE (*)    Leukocytes, UA MODERATE (*)    All other components within normal limits  URINE MICROSCOPIC-ADD ON - Abnormal; Notable for the following:    Squamous Epithelial / LPF 0-5 (*)    Bacteria, UA FEW (*)    All other components within normal limits  URINE CULTURE    EKG  EKG Interpretation None       Radiology Ct Renal Stone Study  Result Date: 01/16/2016 CLINICAL DATA:  Bilateral flank and leg pain EXAM: CT ABDOMEN AND PELVIS WITHOUT CONTRAST TECHNIQUE: Multidetector CT imaging of the abdomen and pelvis was performed following the standard protocol without IV contrast. COMPARISON:  None. FINDINGS: Lower chest:  Large hiatal hernia. Hepatobiliary: Cholecystectomy.  Normal liver and bile ducts. Pancreas: Normal Spleen: Scattered calcified granulomata.  Otherwise normal. Adrenals/Urinary Tract: The adrenals and kidneys are normal in appearance. There is no urinary calculus evident. There is no hydronephrosis or ureteral dilatation. Collecting systems and ureters appear unremarkable. The urinary bladder is remarkable only for mild bladder base impression from the prostate, as well as probable TURP defect. Stomach/Bowel: Large hiatal hernia with nearly all of the stomach in the chest. The distal antrum and  pylorus are below the diaphragm. Small bowel is negative for intrinsic abnormality. No small bowel dilatation or obstruction. Extensive colonic diverticulosis. No evidence of acute diverticulitis or other acute inflammatory process. The appendix is normal. There is unobstructed small bowel  within a right inguinal hernia. Vascular/Lymphatic: The abdominal aorta is normal in caliber. There is extensive atherosclerotic calcification. There is no adenopathy in the abdomen or pelvis. Reproductive: Unremarkable. Other: No bowel obstruction. No extraluminal air. No ascites. No focal inflammatory changes are evident. Musculoskeletal: No significant skeletal lesion. Moderate lumbar degenerative disc and facet changes are present. IMPRESSION: 1. Large hiatal hernia.  Most of the stomach is in the chest 2. Right inguinal hernia containing unobstructed small bowel. 3. Diverticulosis. 4. Normal appendix. No acute inflammatory changes are evident in the abdomen or pelvis. Electronically Signed   By: Andreas Newport M.D.   On: 01/16/2016 05:51   Dg Hips Bilat With Pelvis 3-4 Views  Result Date: 01/16/2016 CLINICAL DATA:  Bilateral hip pain EXAM: DG HIP (WITH OR WITHOUT PELVIS) 3-4V BILAT COMPARISON:  None. FINDINGS: No fracture or ducts location of either hip. There is mild bilateral osteoarthrosis of the hips. No pelvic fracture is visible. Visualized bowel gas pattern is normal. IMPRESSION: No acute fracture or dislocation of the hips. Mild bilateral hip osteoarthrosis. Electronically Signed   By: Ulyses Jarred M.D.   On: 01/16/2016 06:08    Procedures Procedures (including critical care time)  Medications Ordered in ED Medications  ondansetron (ZOFRAN) injection 4 mg (4 mg Intravenous Given 01/16/16 0425)  morphine 2 MG/ML injection 2 mg (2 mg Intravenous Given 01/16/16 0425)  cefTRIAXone (ROCEPHIN) 1 g in dextrose 5 % 50 mL IVPB (1 g Intravenous New Bag/Given 01/16/16 0640)     Initial Impression / Assessment  and Plan / ED Course  Orpah Greek, MD has reviewed the triage vital signs and the nursing notes.  Pertinent labs & imaging results that were available during my care of the patient were reviewed by me and considered in my medical decision making (see chart for details).  Clinical Course   Patient presents to emerge part with complaints of low back pain. Pain was on the left more than the right. Pain has been present for less than one day. He felt like the pain was in his legs first but now is mostly in his back. He has not injured his back. Examination did reveal soft tissue tenderness but he has normal range of motion of lower extremities. Normal strength and sensation. Urinalysis suggest infection. Culture has been ordered and he was administered Rocephin. Lab work was otherwise unremarkable. CT scan was performed because he did have large amount of red blood cells in his urine. This was also performed to evaluate for aortic aneurysm. Patient does not have evidence of ureterolithiasis or aortic abnormality. CT scan did not show any acute pathology. Patient was reassured that his symptoms are likely secondary to infection and will be treated as an outpatient for infection.  Final Clinical Impressions(s) / ED Diagnoses   Final diagnoses:  Pain   Back Pain UTI  New Prescriptions New Prescriptions   No medications on file   I personally performed the services described in this documentation, which was scribed in my presence. The recorded information has been reviewed and is accurate.    Orpah Greek, MD 01/16/16 [id number]

## 2016-01-17 LAB — URINE CULTURE: Culture: NO GROWTH

## 2016-01-19 DIAGNOSIS — N390 Urinary tract infection, site not specified: Secondary | ICD-10-CM | POA: Diagnosis not present

## 2016-01-19 DIAGNOSIS — M79605 Pain in left leg: Secondary | ICD-10-CM | POA: Diagnosis not present

## 2016-01-19 DIAGNOSIS — M79604 Pain in right leg: Secondary | ICD-10-CM | POA: Diagnosis not present

## 2016-01-22 ENCOUNTER — Emergency Department (HOSPITAL_COMMUNITY): Payer: Medicare Other

## 2016-01-22 ENCOUNTER — Inpatient Hospital Stay (HOSPITAL_COMMUNITY)
Admission: EM | Admit: 2016-01-22 | Discharge: 2016-01-25 | DRG: 071 | Disposition: A | Discharge status: Home / Self Care | Payer: Medicare Other | Attending: Internal Medicine | Admitting: Internal Medicine

## 2016-01-22 ENCOUNTER — Encounter (HOSPITAL_COMMUNITY): Payer: Self-pay | Admitting: Nurse Practitioner

## 2016-01-22 DIAGNOSIS — G9340 Encephalopathy, unspecified: Secondary | ICD-10-CM | POA: Diagnosis not present

## 2016-01-22 DIAGNOSIS — N184 Chronic kidney disease, stage 4 (severe): Secondary | ICD-10-CM | POA: Diagnosis present

## 2016-01-22 DIAGNOSIS — Z66 Do not resuscitate: Secondary | ICD-10-CM | POA: Diagnosis present

## 2016-01-22 DIAGNOSIS — I639 Cerebral infarction, unspecified: Secondary | ICD-10-CM | POA: Diagnosis not present

## 2016-01-22 DIAGNOSIS — K449 Diaphragmatic hernia without obstruction or gangrene: Secondary | ICD-10-CM | POA: Diagnosis present

## 2016-01-22 DIAGNOSIS — K4090 Unilateral inguinal hernia, without obstruction or gangrene, not specified as recurrent: Secondary | ICD-10-CM | POA: Diagnosis not present

## 2016-01-22 DIAGNOSIS — I25810 Atherosclerosis of coronary artery bypass graft(s) without angina pectoris: Secondary | ICD-10-CM | POA: Diagnosis present

## 2016-01-22 DIAGNOSIS — I10 Essential (primary) hypertension: Secondary | ICD-10-CM | POA: Diagnosis not present

## 2016-01-22 DIAGNOSIS — I2510 Atherosclerotic heart disease of native coronary artery without angina pectoris: Secondary | ICD-10-CM | POA: Diagnosis present

## 2016-01-22 DIAGNOSIS — I499 Cardiac arrhythmia, unspecified: Secondary | ICD-10-CM | POA: Diagnosis not present

## 2016-01-22 DIAGNOSIS — I129 Hypertensive chronic kidney disease with stage 1 through stage 4 chronic kidney disease, or unspecified chronic kidney disease: Secondary | ICD-10-CM | POA: Diagnosis not present

## 2016-01-22 DIAGNOSIS — D696 Thrombocytopenia, unspecified: Secondary | ICD-10-CM | POA: Diagnosis not present

## 2016-01-22 DIAGNOSIS — F419 Anxiety disorder, unspecified: Secondary | ICD-10-CM | POA: Diagnosis present

## 2016-01-22 DIAGNOSIS — N189 Chronic kidney disease, unspecified: Secondary | ICD-10-CM

## 2016-01-22 DIAGNOSIS — Z79899 Other long term (current) drug therapy: Secondary | ICD-10-CM

## 2016-01-22 DIAGNOSIS — N390 Urinary tract infection, site not specified: Secondary | ICD-10-CM | POA: Diagnosis present

## 2016-01-22 DIAGNOSIS — R4182 Altered mental status, unspecified: Secondary | ICD-10-CM | POA: Diagnosis not present

## 2016-01-22 DIAGNOSIS — Z9049 Acquired absence of other specified parts of digestive tract: Secondary | ICD-10-CM

## 2016-01-22 DIAGNOSIS — R0602 Shortness of breath: Secondary | ICD-10-CM | POA: Diagnosis not present

## 2016-01-22 DIAGNOSIS — R001 Bradycardia, unspecified: Secondary | ICD-10-CM | POA: Diagnosis not present

## 2016-01-22 DIAGNOSIS — N289 Disorder of kidney and ureter, unspecified: Secondary | ICD-10-CM

## 2016-01-22 DIAGNOSIS — E785 Hyperlipidemia, unspecified: Secondary | ICD-10-CM | POA: Diagnosis present

## 2016-01-22 DIAGNOSIS — Z8249 Family history of ischemic heart disease and other diseases of the circulatory system: Secondary | ICD-10-CM

## 2016-01-22 DIAGNOSIS — R748 Abnormal levels of other serum enzymes: Secondary | ICD-10-CM | POA: Diagnosis present

## 2016-01-22 DIAGNOSIS — R410 Disorientation, unspecified: Secondary | ICD-10-CM | POA: Diagnosis present

## 2016-01-22 DIAGNOSIS — I252 Old myocardial infarction: Secondary | ICD-10-CM

## 2016-01-22 DIAGNOSIS — K219 Gastro-esophageal reflux disease without esophagitis: Secondary | ICD-10-CM | POA: Diagnosis present

## 2016-01-22 DIAGNOSIS — I739 Peripheral vascular disease, unspecified: Secondary | ICD-10-CM | POA: Diagnosis present

## 2016-01-22 DIAGNOSIS — F329 Major depressive disorder, single episode, unspecified: Secondary | ICD-10-CM | POA: Diagnosis present

## 2016-01-22 DIAGNOSIS — I2583 Coronary atherosclerosis due to lipid rich plaque: Secondary | ICD-10-CM | POA: Diagnosis present

## 2016-01-22 DIAGNOSIS — R079 Chest pain, unspecified: Secondary | ICD-10-CM | POA: Diagnosis not present

## 2016-01-22 DIAGNOSIS — I1310 Hypertensive heart and chronic kidney disease without heart failure, with stage 1 through stage 4 chronic kidney disease, or unspecified chronic kidney disease: Secondary | ICD-10-CM | POA: Diagnosis not present

## 2016-01-22 DIAGNOSIS — F0390 Unspecified dementia without behavioral disturbance: Secondary | ICD-10-CM | POA: Diagnosis not present

## 2016-01-22 DIAGNOSIS — Z7982 Long term (current) use of aspirin: Secondary | ICD-10-CM

## 2016-01-22 HISTORY — DX: Hypertensive heart disease without heart failure: I11.9

## 2016-01-22 HISTORY — DX: Bradycardia, unspecified: R00.1

## 2016-01-22 HISTORY — DX: Chronic kidney disease, stage 3 unspecified: N18.30

## 2016-01-22 HISTORY — DX: Other ill-defined heart diseases: I51.89

## 2016-01-22 HISTORY — DX: Chronic kidney disease, stage 3 (moderate): N18.3

## 2016-01-22 LAB — COMPREHENSIVE METABOLIC PANEL
ALBUMIN: 4 g/dL (ref 3.5–5.0)
ALT: 18 U/L (ref 17–63)
AST: 17 U/L (ref 15–41)
Alkaline Phosphatase: 51 U/L (ref 38–126)
Anion gap: 6 (ref 5–15)
BUN: 29 mg/dL — AB (ref 6–20)
CHLORIDE: 106 mmol/L (ref 101–111)
CO2: 26 mmol/L (ref 22–32)
CREATININE: 1.86 mg/dL — AB (ref 0.61–1.24)
Calcium: 9.5 mg/dL (ref 8.9–10.3)
GFR calc Af Amer: 35 mL/min — ABNORMAL LOW (ref >60)
GFR, EST NON AFRICAN AMERICAN: 30 mL/min — AB (ref >60)
GLUCOSE: 88 mg/dL (ref 65–99)
Potassium: 4.5 mmol/L (ref 3.5–5.1)
Sodium: 138 mmol/L (ref 135–145)
Total Bilirubin: 0.7 mg/dL (ref 0.3–1.2)
Total Protein: 6.5 g/dL (ref 6.5–8.1)

## 2016-01-22 LAB — CBC
HEMATOCRIT: 43.6 % (ref 39.0–52.0)
Hemoglobin: 14.3 g/dL (ref 13.0–17.0)
MCH: 30.2 pg (ref 26.0–34.0)
MCHC: 32.8 g/dL (ref 30.0–36.0)
MCV: 92 fL (ref 78.0–100.0)
PLATELETS: 154 10*3/uL (ref 150–400)
RBC: 4.74 MIL/uL (ref 4.22–5.81)
RDW: 13.1 % (ref 11.5–15.5)
WBC: 8.4 10*3/uL (ref 4.0–10.5)

## 2016-01-22 LAB — URINALYSIS, ROUTINE W REFLEX MICROSCOPIC
BILIRUBIN URINE: NEGATIVE
GLUCOSE, UA: NEGATIVE mg/dL
HGB URINE DIPSTICK: NEGATIVE
Ketones, ur: NEGATIVE mg/dL
Leukocytes, UA: NEGATIVE
Nitrite: NEGATIVE
PH: 6 (ref 5.0–8.0)
Protein, ur: NEGATIVE mg/dL
SPECIFIC GRAVITY, URINE: 1.016 (ref 1.005–1.030)

## 2016-01-22 LAB — LACTIC ACID, PLASMA: Lactic Acid, Venous: 0.8 mmol/L (ref 0.5–1.9)

## 2016-01-22 LAB — CBG MONITORING, ED: Glucose-Capillary: 79 mg/dL (ref 65–99)

## 2016-01-22 MED ORDER — CEPHALEXIN 500 MG PO CAPS
500.0000 mg | ORAL_CAPSULE | Freq: Two times a day (BID) | ORAL | Status: DC
  Filled 2016-01-22: qty 1

## 2016-01-22 MED ORDER — SODIUM CHLORIDE 0.9 % IV BOLUS (SEPSIS)
1000.0000 mL | Freq: Once | INTRAVENOUS | Status: AC
  Administered 2016-01-22: 1000 mL via INTRAVENOUS

## 2016-01-22 MED ORDER — HYDRALAZINE HCL 20 MG/ML IJ SOLN
5.0000 mg | Freq: Once | INTRAMUSCULAR | Status: AC | PRN
  Administered 2016-01-22: 5 mg via INTRAVENOUS
  Filled 2016-01-22: qty 1

## 2016-01-22 MED ORDER — LABETALOL HCL 5 MG/ML IV SOLN: 10.0000 mg | Freq: Once | INTRAVENOUS | Status: DC

## 2016-01-22 MED ORDER — VENLAFAXINE HCL ER 75 MG PO CP24
75.0000 mg | ORAL_CAPSULE | Freq: Every day | ORAL | Status: DC
  Administered 2016-01-24 – 2016-01-25 (×2): 75 mg via ORAL
  Filled 2016-01-22 (×2): qty 1

## 2016-01-22 MED ORDER — IOPAMIDOL (ISOVUE-300) INJECTION 61%
INTRAVENOUS | Status: AC
  Administered 2016-01-22: 75 mL
  Filled 2016-01-22: qty 75

## 2016-01-22 MED ORDER — HYDRALAZINE HCL 20 MG/ML IJ SOLN: 5.0000 mg | Freq: Once | INTRAMUSCULAR | Status: DC

## 2016-01-22 MED ORDER — ENOXAPARIN SODIUM 30 MG/0.3ML ~~LOC~~ SOLN: 30.0000 mg | Freq: Every day | SUBCUTANEOUS | Status: DC

## 2016-01-22 MED ORDER — LISINOPRIL 10 MG PO TABS
10.0000 mg | ORAL_TABLET | Freq: Every day | ORAL | Status: DC
  Administered 2016-01-23: 10 mg via ORAL
  Filled 2016-01-22: qty 1

## 2016-01-22 MED ORDER — ASPIRIN EC 81 MG PO TBEC
81.0000 mg | DELAYED_RELEASE_TABLET | Freq: Every day | ORAL | Status: DC
  Administered 2016-01-23 – 2016-01-25 (×3): 81 mg via ORAL
  Filled 2016-01-22 (×4): qty 1

## 2016-01-22 MED ORDER — HYDROCODONE-ACETAMINOPHEN 5-325 MG PO TABS
1.0000 | ORAL_TABLET | Freq: Once | ORAL | Status: AC
  Administered 2016-01-22: 1 via ORAL
  Filled 2016-01-22: qty 1

## 2016-01-22 MED ORDER — SODIUM CHLORIDE 0.9 % IV SOLN
INTRAVENOUS | Status: DC
  Administered 2016-01-22: 17:00:00 via INTRAVENOUS

## 2016-01-22 MED ORDER — ACETAMINOPHEN 325 MG PO TABS: 650.0000 mg | ORAL_TABLET | Freq: Once | ORAL | Status: AC

## 2016-01-22 MED ORDER — PANTOPRAZOLE SODIUM 40 MG PO TBEC: 80.0000 mg | DELAYED_RELEASE_TABLET | Freq: Every day | ORAL | Status: DC | PRN

## 2016-01-22 MED ORDER — ISOSORBIDE MONONITRATE ER 30 MG PO TB24
30.0000 mg | ORAL_TABLET | Freq: Every day | ORAL | Status: DC
  Administered 2016-01-23 – 2016-01-25 (×3): 30 mg via ORAL
  Filled 2016-01-22 (×3): qty 1

## 2016-01-22 MED ORDER — ROSUVASTATIN CALCIUM 10 MG PO TABS
5.0000 mg | ORAL_TABLET | Freq: Every evening | ORAL | Status: DC
  Administered 2016-01-23 – 2016-01-24 (×2): 5 mg via ORAL
  Filled 2016-01-22 (×2): qty 1

## 2016-01-22 MED ORDER — ACETAMINOPHEN 500 MG PO TABS
1000.0000 mg | ORAL_TABLET | Freq: Three times a day (TID) | ORAL | Status: DC
  Administered 2016-01-23 – 2016-01-25 (×6): 1000 mg via ORAL
  Filled 2016-01-22 (×7): qty 2

## 2016-01-22 NOTE — ED Provider Notes (Signed)
[facility] DEPT Provider Note   CSN: [id number] Arrival date & time: 01/22/16  1529     History   Chief Complaint Chief Complaint  Patient presents with  . Altered Mental Status    HPI Scott [name] is a 82 y.o. male.  Pt lives at an independent living facility with his wife.  He normally is able to care for himself and is alert and oriented.  He was here on 8/29 and was dx'd with an UTI and was placed on Keflex.  However, his urine culture showed no growth.  The pt was a little confused yesterday, but today, he has been very confused.  The wife said that he woke up and was stumbling around running into things.  He had to be helped to eat breakfast and lunch.  He knows his name, but is otherwise not oriented.  He has not had any fevers.  He has had some back pain and had a tramadol at 0400.      Past Medical History:  Diagnosis Date  . Anginal pain ([place]) ~ 1992  . Anxiety   . CAD (coronary artery disease)    with CABG in 1983  . Claudication ([place])    BLE  . Depression   . Dyspnea 2010   syndrome - extensive  . Erectile dysfunction   . Exertional shortness of breath   . GERD (gastroesophageal reflux disease)   . H/O hiatal hernia   . History of blood transfusion 2014  . Hx of adenomatous colonic polyps 2007  . Hyperlipidemia   . Hypertension   . Inguinal hernia   . Iron deficiency anemia   . Shingles 1990's  . Sigmoid diverticulosis   . Upper GI bleeding 2014   Gastritis and possible duodenal ulcer seen on EGD, felt secondary to NSAIDs  . Urethral stricture 11/2008  . Vasovagal syncope 05/2006  . Weight loss     Patient Active Problem List   Diagnosis Date Noted  . Altered mental status 01/22/2016  . Bradycardia, toprol stopped 10/15/2014  . CAD (coronary artery disease) of artery bypass graft, ALL VGs occluded, patent LIMA -->LAD, patent dominant RCA 10/15/2014  . Elevated troponin 10/13/2014  . Non-STEMI (non-ST elevated myocardial infarction) ([place])  10/13/2014  . NSTEMI (non-ST elevated myocardial infarction) ([place])   . GI bleed 03/24/2013  . Anemia 03/24/2013  . CKD (chronic kidney disease) 03/24/2013  . Syncope 08/15/2012  . Orthostasis 08/15/2012  . AKI (acute kidney injury) ([place]) 08/15/2012  . Dehydration 08/15/2012  . Thrombocytopenia ([place]) 08/15/2012  . Hypertension   . Anxiety   . Depression   . GERD (gastroesophageal reflux disease)   . Hyperlipidemia   . CAD (coronary artery disease)   . HIATAL HERNIA 09/21/2008  . DYSPNEA 09/21/2008  . Nonspecific (abnormal) findings on radiological and other examination of body structure 09/21/2008  . CT, CHEST, ABNORMAL 09/21/2008    Past Surgical History:  Procedure Laterality Date  . CARDIAC CATHETERIZATION  2007   Left main 100%, RCA 70% ostial, all grafts were patent, EF 40-45 percent, distal circumflex subtotal after graft insertion, medical therapy   . CARDIAC CATHETERIZATION N/A 10/14/2014   Procedure: Left Heart Cath and Cors/Grafts Angiography;  Surgeon: Lorretta Harp, MD;  Location: [facility];  Service: Cardiovascular;  Laterality: N/A;  . CHOLECYSTECTOMY  1980's  . CORONARY ARTERY BYPASS GRAFT  1994    LIMA-LAD, SVG-D1, SVG-OM,  SVG-RCA; performed in [place], [place]  . ESOPHAGOGASTRODUODENOSCOPY N/A 03/25/2013  Procedure: ESOPHAGOGASTRODUODENOSCOPY (EGD);  Surgeon: Missy Sabins, MD;  gastritis, cannot rule out duodenal ulcer   . INGUINAL HERNIA REPAIR Left   . TRANSURETHRAL RESECTION OF PROSTATE         Home Medications    Prior to Admission medications   Medication Sig Start Date End Date Taking? Authorizing Provider  acetaminophen (TYLENOL) 325 MG tablet Take 2 tablets (650 mg total) by mouth every 4 (four) hours as needed for headache or mild pain. 10/15/14  Yes Isaiah Serge, NP  albuterol (PROAIR HFA) 108 (90 Base) MCG/ACT inhaler Inhale 2 puffs into the lungs every 6 (six) hours as needed for wheezing or shortness of breath.   Yes  Historical Provider, MD  aspirin EC 81 MG tablet Take 81 mg by mouth daily.   Yes Historical Provider, MD  cephALEXin (KEFLEX) 500 MG capsule Take 2 capsules (1,000 mg total) by mouth 2 (two) times daily. Patient taking differently: Take 1,000 mg by mouth 2 (two) times daily. 10 day course started 01/16/16 01/16/16  Yes Orpah Greek, MD  desvenlafaxine (PRISTIQ) 50 MG 24 hr tablet Take 25 mg by mouth every evening.    Yes Historical Provider, MD  esomeprazole (NEXIUM) 40 MG capsule Take 40 mg by mouth daily as needed (acid reflux/ heartburn).   Yes Historical Provider, MD  ferrous sulfate (KP FERROUS SULFATE) 325 (65 FE) MG tablet Take 325 mg by mouth daily with breakfast.    Yes Historical Provider, MD  isosorbide mononitrate (IMDUR) 30 MG 24 hr tablet Take 30 mg by mouth daily.   Yes Historical Provider, MD  lisinopril (PRINIVIL,ZESTRIL) 10 MG tablet Take 1 tablet (10 mg total) by mouth daily. 07/31/15  Yes Jettie Booze, MD  nitroGLYCERIN (NITROSTAT) 0.4 MG SL tablet Place 1 tablet (0.4 mg total) under the tongue every 5 (five) minutes x 3 doses as needed for chest pain. 10/15/14  Yes Isaiah Serge, NP  Omega-3 Fatty Acids (FISH OIL PO) Take 1 capsule by mouth daily.   Yes Historical Provider, MD  rosuvastatin (CRESTOR) 10 MG tablet Take 5 mg by mouth every evening.    Yes Historical Provider, MD  traMADol (ULTRAM) 50 MG tablet Take 0.5 tablets (25 mg total) by mouth every 6 (six) hours as needed. Patient taking differently: Take 25 mg by mouth every 6 (six) hours as needed (pain).  01/16/16  Yes Orpah Greek, MD  vitamin B-12 (CYANOCOBALAMIN) 1000 MCG tablet Take 1,000 mcg by mouth every evening.    Yes Historical Provider, MD  esomeprazole (NEXIUM) 40 MG packet Take 40 mg by mouth 2 (two) times daily. Patient not taking: Reported on 01/16/2016 03/26/13   Lavone Orn, MD    Family History Family History  Problem Relation Age of Onset  . Hypertension Mother   . Heart  failure Brother   . Heart attack Neg Hx   . Stroke Neg Hx     Social History Social History  Substance Use Topics  . Smoking status: Never Smoker  . Smokeless tobacco: Never Used  . Alcohol use 4.2 oz/week    7 Glasses of wine per week     Comment: wine - daily     Allergies   Review of patient's allergies indicates no known allergies.   Review of Systems Review of Systems  Musculoskeletal: Positive for back pain.  Neurological:       Confusion, ambulatory dysfunction  All other systems reviewed and are negative.    Physical Exam  Updated Vital Signs BP 192/94   Pulse 83   Temp 98.8 F (37.1 C) (Rectal)   Resp 20   SpO2 97%   Physical Exam  Constitutional: He appears well-developed and well-nourished.  HENT:  Head: Normocephalic and atraumatic.  Right Ear: External ear normal.  Left Ear: External ear normal.  Nose: Nose normal.  Mouth/Throat: Oropharynx is clear and moist.  Eyes: Conjunctivae and EOM are normal. Pupils are equal, round, and reactive to light.  Neck: Normal range of motion. Neck supple.  Cardiovascular: Normal rate, regular rhythm, normal heart sounds and intact distal pulses.   Pulmonary/Chest: Effort normal and breath sounds normal.  Abdominal: Soft. Bowel sounds are normal. A hernia is present. Hernia confirmed positive in the right inguinal area.  Genitourinary:  Genitourinary Comments: Hernia is tender, but reducible  Musculoskeletal: Normal range of motion.  Neurological: He is alert. He is disoriented.  Pt is awake and oriented only to his name.  He will follow commands, but it takes him awhile to do them.  Skin: Skin is warm and dry.  Nursing note and vitals reviewed.    ED Treatments / Results  Labs (all labs ordered are listed, but only abnormal results are displayed) Labs Reviewed  COMPREHENSIVE METABOLIC PANEL - Abnormal; Notable for the following:       Result Value   BUN 29 (*)    Creatinine, Ser 1.86 (*)    GFR calc  non Af Amer 30 (*)    GFR calc Af Amer 35 (*)    All other components within normal limits  URINE CULTURE  CBC  LACTIC ACID, PLASMA  URINALYSIS, ROUTINE W REFLEX MICROSCOPIC (NOT AT [facility])  CBG MONITORING, ED    EKG  EKG Interpretation  Date/Time:  Monday January 22 2016 15:38:23 EDT Ventricular Rate:  61 PR Interval:    QRS Duration: 83 QT Interval:  405 QTC Calculation: 408 R Axis:   58 Text Interpretation:  Sinus rhythm Minimal ST elevation, anterior leads Confirmed by Gilford Raid MD, Keana Dueitt ([id number]) on 01/22/2016 4:11:36 PM       Radiology Dg Chest 2 View  Result Date: 01/22/2016 CLINICAL DATA:  Altered mental status today. Chest pain and exertional shortness of breath. EXAM: CHEST  2 VIEW COMPARISON:  PA and lateral chest 10/13/2014 and single view of the chest 09/07/2013. FINDINGS: A large hiatal hernia is again seen. The patient is status post CABG. Left basilar atelectasis is noted. No pneumothorax or pleural effusion. No focal bony abnormality. IMPRESSION: No acute disease. Large hiatal hernia. Electronically Signed   By: Inge Rise M.D.   On: 01/22/2016 16:44   Ct Head Wo Contrast  Result Date: 01/22/2016 CLINICAL DATA:  Patient was at baseline yesterday and is unable walk today. Recent UTI. EXAM: CT HEAD WITHOUT CONTRAST TECHNIQUE: Contiguous axial images were obtained from the base of the skull through the vertex without intravenous contrast. COMPARISON:  July 11, 2012 FINDINGS: Brain: No subdural, epidural, or subarachnoid hemorrhage. Ventricles and sulci R prominent but essentially stable in the interval. The cerebellum is normal. Mild decreased attenuation in the right side of the pons on axial image and may be artifactual versus an age-indeterminate lacunar infarct. Remainder of the brainstem is normal. The basal cisterns are widely patent. Scattered white matter changes are identified. While they are similar in the interval, there has been some mild interval  worsening. There is a lacunar infarct in the right thalamus not seen previously, favored to be nonacute but not specific.  Low attenuation projected over the left external capsule on axial image 19 is noted to be volume averaging off the CSF in the adjacent fissure based on coronal images. No other evidence of acute infarct or ischemia. No mass, mass effect, or midline shift. Vascular: There are calcifications within the intracranial portions of the carotid arteries. The vessel are otherwise normal in appearance. Skull: Normal. Sinuses/Orbits: Debris is again seen in the sphenoid sinus with mucosal thickening in the ethmoid, frontal, and sphenoid sinuses. The mastoid air cells and middle ears are well aerated. Other: No other acute abnormalities. IMPRESSION: 1. There is a lacunar infarct in the right thalamus not seen in 2014, age indeterminate but favored to be nonacute. Low-attenuation in the right side of the pons could be artifactual versus an age indeterminate lacunar infarct. Recommend clinical correlation. An MRI could better evaluate the brain for subtle ischemia/infarct if clinical concern persists. 2. Sinus disease as above. Electronically Signed   By: Dorise Bullion III M.D   On: 01/22/2016 17:54   Ct Abdomen Pelvis W Contrast  Result Date: 01/22/2016 CLINICAL DATA:  Severe abdominal pain EXAM: CT ABDOMEN AND PELVIS WITH CONTRAST TECHNIQUE: Multidetector CT imaging of the abdomen and pelvis was performed using the standard protocol following bolus administration of intravenous contrast. CONTRAST:  1 ISOVUE-300 IOPAMIDOL (ISOVUE-300) INJECTION 61% COMPARISON:  CT scan January 16, 2016 FINDINGS: There is a large hiatal hernia with most of the stomach above the diaphragm. The lung bases are otherwise unchanged. No free air or free fluid. There is a right inguinal hernia containing distal small bowel without obstruction. The patient is status post cholecystectomy. The liver and portal vein are otherwise  normal. Granulomata are seen in the otherwise stable spleen. Low-attenuation the spleen is stable as well of doubtful significance. The pancreas and adrenal glands are normal. The left kidney is atrophic compared to the right with no hydronephrosis or perinephric stranding. No ureteral stones. The right kidney also demonstrates no stones or masses. No hydronephrosis. No right ureteral stones. Atherosclerotic changes seen in the non aneurysmal aorta. No adenopathy. The small bowel is normal. The colon demonstrates diverticulosis without diverticulitis. The appendix is normal. No adenopathy in the pelvis. There is a TURP defect in the prostate. The bladder is mildly distended but otherwise unremarkable. Severe degenerative changes in the spine. Delayed images demonstrate no filling defects in the bilateral renal collecting systems. IMPRESSION: 1. There is a large right inguinal hernia containing small bowel without obstruction. 2. There is a large hiatal hernia with most of the stomach above the diaphragm. Electronically Signed   By: Dorise Bullion III M.D   On: 01/22/2016 19:22    Procedures Procedures (including critical care time)  Medications Ordered in ED Medications  sodium chloride 0.9 % bolus 1,000 mL (0 mLs Intravenous Stopped 01/22/16 1717)    And  0.9 %  sodium chloride infusion ( Intravenous New Bag/Given 01/22/16 1718)  sodium chloride 0.9 % bolus 1,000 mL (0 mLs Intravenous Stopped 01/22/16 1930)  iopamidol (ISOVUE-300) 61 % injection (75 mLs  Contrast Given 01/22/16 1824)  HYDROcodone-acetaminophen (NORCO/VICODIN) 5-325 MG per tablet 1 tablet (1 tablet Oral Given 01/22/16 2039)     Initial Impression / Assessment and Plan / ED Course  I have reviewed the triage vital signs and the nursing notes.  Pertinent labs & imaging results that were available during my care of the patient were reviewed by me and considered in my medical decision making (see chart for details).  Clinical  Course    While here, pt has remained very confused.  He has urinated all over the room.  He has tried to get up and has stumbled around the room.  He has tried to urinate in the soiled linen basket.  He keeps saying that something hurts, but can't localize the pain.  At this point, pt is not able to safely go back home, and I am not sure what is causing sx.  I spoke with Dr. Loleta Books ([facility]) who will admit pt for obs.  Final Clinical Impressions(s) / ED Diagnoses   Final diagnoses:  Altered mental status, unspecified altered mental status type  CRI (chronic renal insufficiency), unspecified stage    New Prescriptions New Prescriptions   No medications on file     Isla Pence, MD 01/22/16 2139

## 2016-01-22 NOTE — ED Notes (Addendum)
Pt lives with wife in independent living facility and at baseline is able to care for self. Patient is being treated with cephalexin starting on tuesday for UTI and tramadol for leg pain. Wife states yesterday patient was alert and oriented x4 but daughter noticed for a moment patient had an episode of confusion. Per wife, This morning pt woke up at 4 am and patient was groaning and pt went back to sleep till 11 am and was groaning again so wife gave him a tramadol for pain.

## 2016-01-22 NOTE — ED Triage Notes (Signed)
[facility] for altered mental status. Patient at baseline is a&ox4 at baseline and amblautory, today is alert and oriented only to self. Patient is currently being treated for UTI. Pt able to follow simple commands but poor attention. Negative stroke screen.   Lungs clear throughout, 12-lead unremarkable.

## 2016-01-22 NOTE — ED Notes (Signed)
Hospitalist at bedside 

## 2016-01-22 NOTE — ED Notes (Signed)
Patient taken to CT.

## 2016-01-22 NOTE — ED Notes (Addendum)
Pt unable to voice if he needs to void at this time. Pt has history of urethral strictures discussed with MD other options for obtaining urine sample. Will try condom cathter because patient is unable to voice needs at present time.  RN placed condom catheter with Scott [name] and explained procedure to pt and wife.

## 2016-01-22 NOTE — ED Notes (Signed)
Patient ambulated with walker with steady gait by [facility] EMT

## 2016-01-22 NOTE — H&P (Signed)
History and Physical  Patient Name: Scott [name]     QZE:[id number]    DOB: 1924-01-06    DOA: 01/22/2016 PCP: Henrine Screws, MD   Patient coming from: [facility]  Chief Complaint: Altered mental status  HPI: Scott [name] is a 82 y.o. male with a past medical history significant for CAD s/p CABG in '83, CKD III-IV baseline Cr 1.9, HTN, and anemia who presents with 2 days altered mental status.  The patient was in his normal state of health (lives in independent living, independent with all ADLs, no known dementia), until one week ago. He presented to the ER with bilateral leg pain, was diagnosed incidentally with UTI because of gross hematuria, urine nitrites and pyuria and started on cephalexin (culture negative), and discharged on tramadol for pain.  His wife reports that his pain was poorly controlled and she was giving him 25 mg tramadol every six hours or sooner, and since yesterday giving 50 mg tramadol every 4-6 hours.    Then yesterday, she noticed at lunch he was confused.  His daughter asked him a question and responded with a non-sequitur about "cutting down trees".  This odd behavior continued off and on all afternoon (standing for prolonged periods staring at the carpet, seeming confused by his bedspread) and this morning until she finally thought it was getting worse so she brought him to the ER.  At no point did he seem to have a fever, chills, cough.  At no point did he complain of specific pain (he seemed frequently to her to BE in pain, but could not articulate where).  At no point did he have facial droop, slurred speech, trouble walking, focal weakness, LOC, seizure.  ED course: -Afebrile, heart rate 60s, respirations and pulse oximetry normal, hypertensive to 180/100 mmHg -Na 138, K 4.2, Cr 1.86 (baseline 1.8-2.0), WBC 8.4 K, Hgb 14, lactic acid normal -Urinalysis without hematuria or pyuria or nitrates -CXR clear -CT head without contrast  showed lacunar infarcts, new from 2014 but chronicity indeterminate, MRI recommended for better definition -CT of the abdomen and pelvis with contrast showed no stones, hydronephrosis, stranding, abnormalities of the pancreas small bowel or colon, and only a large hiatal hernia and inguinal hernia -He was given 2.5L fluids and TRH were asked to evaluate     ROS: Review of Systems  Unable to perform ROS: Mental status change          Past Medical History:  Diagnosis Date  . Anginal pain ([place]) ~ 1992  . Anxiety   . CAD (coronary artery disease)    with CABG in 1983  . Claudication ([place])    BLE  . Depression   . Dyspnea 2010   syndrome - extensive  . Erectile dysfunction   . Exertional shortness of breath   . GERD (gastroesophageal reflux disease)   . H/O hiatal hernia   . History of blood transfusion 2014  . Hx of adenomatous colonic polyps 2007  . Hyperlipidemia   . Hypertension   . Inguinal hernia   . Iron deficiency anemia   . Shingles 1990's  . Sigmoid diverticulosis   . Upper GI bleeding 2014   Gastritis and possible duodenal ulcer seen on EGD, felt secondary to NSAIDs  . Urethral stricture 11/2008  . Vasovagal syncope 05/2006  . Weight loss     Past Surgical History:  Procedure Laterality Date  . CARDIAC CATHETERIZATION  2007   Left main 100%, RCA 70% ostial,  all grafts were patent, EF 40-45 percent, distal circumflex subtotal after graft insertion, medical therapy   . CARDIAC CATHETERIZATION N/A 10/14/2014   Procedure: Left Heart Cath and Cors/Grafts Angiography;  Surgeon: Lorretta Harp, MD;  Location: [facility];  Service: Cardiovascular;  Laterality: N/A;  . CHOLECYSTECTOMY  1980's  . CORONARY ARTERY BYPASS GRAFT  1994    LIMA-LAD, SVG-D1, SVG-OM,  SVG-RCA; performed in [place], [place]  . ESOPHAGOGASTRODUODENOSCOPY N/A 03/25/2013   Procedure: ESOPHAGOGASTRODUODENOSCOPY (EGD);  Surgeon: Missy Sabins, MD;  gastritis, cannot rule out duodenal  ulcer   . INGUINAL HERNIA REPAIR Left   . TRANSURETHRAL RESECTION OF PROSTATE      Social History: Patient lives in [place] living with his wife at [facility].  The patient walks with a cane at baseline.  Never smoker.  Moderate alcohol use, none recently.  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  Independent with ADLs at baseline, no known dementia.  No Known Allergies  Family history: family history includes Heart failure in his brother; Hypertension in his mother.  Prior to Admission medications   Medication Sig Start Date End Date Taking? Authorizing Provider  acetaminophen (TYLENOL) 325 MG tablet Take 2 tablets (650 mg total) by mouth every 4 (four) hours as needed for headache or mild pain. 10/15/14  Yes Isaiah Serge, NP  albuterol (PROAIR HFA) 108 (90 Base) MCG/ACT inhaler Inhale 2 puffs into the lungs every 6 (six) hours as needed for wheezing or shortness of breath.   Yes Historical Provider, MD  aspirin EC 81 MG tablet Take 81 mg by mouth daily.   Yes Historical Provider, MD  cephALEXin (KEFLEX) 500 MG capsule Take 2 capsules (1,000 mg total) by mouth 2 (two) times daily. Patient taking differently: Take 1,000 mg by mouth 2 (two) times daily. 10 day course started 01/16/16 01/16/16  Yes Orpah Greek, MD  desvenlafaxine (PRISTIQ) 50 MG 24 hr tablet Take 25 mg by mouth every evening.    Yes Historical Provider, MD  esomeprazole (NEXIUM) 40 MG capsule Take 40 mg by mouth daily as needed (acid reflux/ heartburn).   Yes Historical Provider, MD  ferrous sulfate (KP FERROUS SULFATE) 325 (65 FE) MG tablet Take 325 mg by mouth daily with breakfast.    Yes Historical Provider, MD  isosorbide mononitrate (IMDUR) 30 MG 24 hr tablet Take 30 mg by mouth daily.   Yes Historical Provider, MD  lisinopril (PRINIVIL,ZESTRIL) 10 MG tablet Take 1 tablet (10 mg total) by mouth daily. 07/31/15  Yes Jettie Booze, MD    nitroGLYCERIN (NITROSTAT) 0.4 MG SL tablet Place 1 tablet (0.4 mg total) under the tongue every 5 (five) minutes x 3 doses as needed for chest pain. 10/15/14  Yes Isaiah Serge, NP  Omega-3 Fatty Acids (FISH OIL PO) Take 1 capsule by mouth daily.   Yes Historical Provider, MD  rosuvastatin (CRESTOR) 10 MG tablet Take 5 mg by mouth every evening.    Yes Historical Provider, MD  traMADol (ULTRAM) 50 MG tablet Take 0.5 tablets (25 mg total) by mouth every 6 (six) hours as needed. Patient taking differently: Take 25 mg by mouth every 6 (six) hours as needed (pain).  01/16/16  Yes Orpah Greek, MD  vitamin B-12 (CYANOCOBALAMIN) 1000 MCG tablet Take 1,000 mcg by mouth every evening.    Yes Historical Provider, MD  esomeprazole (NEXIUM) 40 MG packet Take 40 mg by mouth 2 (two)  times daily. Patient not taking: Reported on 01/16/2016 03/26/13   Lavone Orn, MD       Physical Exam: BP 176/87   Pulse 68   Temp 98.8 F (37.1 C) (Rectal)   Resp 16   SpO2 99%  General appearance: Well-developed, thin elderly adult male, confused and rocking back and forth, groaning occasionally.  At times makes eye contact and makes simple answers, at times does not acknowledge questions.   Eyes: Anicteric, conjunctiva pink, lids and lashes normal.     ENT: No nasal deformity, discharge, or epistaxis.  OP moist without lesions.   Lymph: No cervical or supraclavicular lymphadenopathy. Skin: Warm and dry.  No jaundice.  No suspicious rashes or lesions. Cardiac: RRR, nl S1-S2, no murmurs appreciated.  Capillary refill is brisk.  JVP normal.  No LE edema.  Radial and DP pulses 2+ and symmetric, feet warm. Respiratory: Normal respiratory rate and rhythm.  CTAB without rales or wheezes. GI: No ascites, distension, hepatosplenomegaly.   Voluntary guarding, no focal tenderness.   MSK: No deformities or effusions.  No clubbing/cyanosis. Neuro: Pupils reactive, EOM unable to test because of patient cooperation.  Equal  palate elevation, normal phonation, articulation.  Trapezius normal.  Upper extremity strength 5/5 and symmetric.  Gait shuffling, lower extremity strength symmetric.  When asked "what is your name?" responds "[place]..." which his wife says is his address. When asked his age, states "68" confidently.  Otherwise intermittently inattentive to questions.  Psych: Affect constrained, appears in pain, rocking.  Delirium.      Labs on Admission:  I have personally reviewed following labs and imaging studies: CBC:  Recent Labs Lab 01/16/16 0430 01/22/16 1535  WBC 10.0 8.4  NEUTROABS 7.8*  --   HGB 13.3 14.3  HCT 40.4 43.6  MCV 91.8 92.0  PLT 167 856   Basic Metabolic Panel:  Recent Labs Lab 01/16/16 0430 01/22/16 1535  NA 137 138  K 4.4 4.5  CL 109 106  CO2 20* 26  GLUCOSE 107* 88  BUN 32* 29*  CREATININE 1.68* 1.86*  CALCIUM 8.8* 9.5   GFR: CrCl cannot be calculated (Unknown ideal weight.).  Liver Function Tests:  Recent Labs Lab 01/22/16 1535  AST 17  ALT 18  ALKPHOS 51  BILITOT 0.7  PROT 6.5  ALBUMIN 4.0   CBG:  Recent Labs Lab 01/22/16 1552  GLUCAP 79   Sepsis Labs: Lactic acid normal  Recent Results (from the past 240 hour(s))  Urine culture     Status: None   Collection Time: 01/16/16  4:25 AM  Result Value Ref Range Status   Specimen Description URINE, RANDOM  Final   Special Requests ADDED [id number]  Final   Culture NO GROWTH  Final   Report Status 01/17/2016 FINAL  Final         Radiological Exams on Admission: Personally reviewed: Dg Chest 2 View  Result Date: 01/22/2016 CLINICAL DATA:  Altered mental status today. Chest pain and exertional shortness of breath. EXAM: CHEST  2 VIEW COMPARISON:  PA and lateral chest 10/13/2014 and single view of the chest 09/07/2013. FINDINGS: A large hiatal hernia is again seen. The patient is status post CABG. Left basilar atelectasis is noted. No pneumothorax or pleural effusion. No focal bony abnormality.  IMPRESSION: No acute disease. Large hiatal hernia. Electronically Signed   By: Inge Rise M.D.   On: 01/22/2016 16:44   Ct Head Wo Contrast  Result Date: 01/22/2016 CLINICAL DATA:  Patient was at  baseline yesterday and is unable walk today. Recent UTI. EXAM: CT HEAD WITHOUT CONTRAST TECHNIQUE: Contiguous axial images were obtained from the base of the skull through the vertex without intravenous contrast. COMPARISON:  July 11, 2012 FINDINGS: Brain: No subdural, epidural, or subarachnoid hemorrhage. Ventricles and sulci R prominent but essentially stable in the interval. The cerebellum is normal. Mild decreased attenuation in the right side of the pons on axial image and may be artifactual versus an age-indeterminate lacunar infarct. Remainder of the brainstem is normal. The basal cisterns are widely patent. Scattered white matter changes are identified. While they are similar in the interval, there has been some mild interval worsening. There is a lacunar infarct in the right thalamus not seen previously, favored to be nonacute but not specific. Low attenuation projected over the left external capsule on axial image 19 is noted to be volume averaging off the CSF in the adjacent fissure based on coronal images. No other evidence of acute infarct or ischemia. No mass, mass effect, or midline shift. Vascular: There are calcifications within the intracranial portions of the carotid arteries. The vessel are otherwise normal in appearance. Skull: Normal. Sinuses/Orbits: Debris is again seen in the sphenoid sinus with mucosal thickening in the ethmoid, frontal, and sphenoid sinuses. The mastoid air cells and middle ears are well aerated. Other: No other acute abnormalities. IMPRESSION: 1. There is a lacunar infarct in the right thalamus not seen in 2014, age indeterminate but favored to be nonacute. Low-attenuation in the right side of the pons could be artifactual versus an age indeterminate lacunar infarct.  Recommend clinical correlation. An MRI could better evaluate the brain for subtle ischemia/infarct if clinical concern persists. 2. Sinus disease as above. Electronically Signed   By: Dorise Bullion III M.D   On: 01/22/2016 17:54   Ct Abdomen Pelvis W Contrast  Result Date: 01/22/2016 CLINICAL DATA:  Severe abdominal pain EXAM: CT ABDOMEN AND PELVIS WITH CONTRAST TECHNIQUE: Multidetector CT imaging of the abdomen and pelvis was performed using the standard protocol following bolus administration of intravenous contrast. CONTRAST:  1 ISOVUE-300 IOPAMIDOL (ISOVUE-300) INJECTION 61% COMPARISON:  CT scan January 16, 2016 FINDINGS: There is a large hiatal hernia with most of the stomach above the diaphragm. The lung bases are otherwise unchanged. No free air or free fluid. There is a right inguinal hernia containing distal small bowel without obstruction. The patient is status post cholecystectomy. The liver and portal vein are otherwise normal. Granulomata are seen in the otherwise stable spleen. Low-attenuation the spleen is stable as well of doubtful significance. The pancreas and adrenal glands are normal. The left kidney is atrophic compared to the right with no hydronephrosis or perinephric stranding. No ureteral stones. The right kidney also demonstrates no stones or masses. No hydronephrosis. No right ureteral stones. Atherosclerotic changes seen in the non aneurysmal aorta. No adenopathy. The small bowel is normal. The colon demonstrates diverticulosis without diverticulitis. The appendix is normal. No adenopathy in the pelvis. There is a TURP defect in the prostate. The bladder is mildly distended but otherwise unremarkable. Severe degenerative changes in the spine. Delayed images demonstrate no filling defects in the bilateral renal collecting systems. IMPRESSION: 1. There is a large right inguinal hernia containing small bowel without obstruction. 2. There is a large hiatal hernia with most of the stomach  above the diaphragm. Electronically Signed   By: Dorise Bullion III M.D   On: 01/22/2016 19:22    EKG: Independently reviewed. Rate 61, QTc 408, sinus  rhythm, no ST changes.    Assessment/Plan 1. Delirium:  Tramadol is favored to be the inciting factor in the setting of chronic renal insufficiency.   Dehydration is doubted to be contributing based on labs, and he has been generously hydrated in the ER.  Hypertensive encephalopathy may also contribute.  We'll check TSH and troponin.  Will also follow-up CT findings in brain. Infection doubted.    -Hold tramadol -Acetaminophen 1g TID for pain, reassess pain when mental status clearer -BP control tonight with hydralazine then resume home Imdur and lisinopril -Add on troponin and TSH -MRI brain ordered     2. HTN and CAD secondary prevention:  Hypertensive at admission.  Wife thinks no BP meds in 2 days.  Intolerant to BBs. -Hydralazine once -Resume home Imdur and hydralazine -Continue statin and aspirin  3. CKD:  Stable, at baseline.  Cr 1.8-2.0 at baseline.  eGFR 25-35 typically.  4. UTI:  -Continue cephalexin BID until 9/7  5. Anxiety: -Hold desvenlafaxine for 1 day  6. Other medications: -Continue PPI    DVT prophylaxis: Lovenox  Code Status: DO NOT RESUSCITATE  Family Communication: Wife at bedside, an opportunity for questions was given and all questions were answered.  CODE STATUS was confirmed.  Disposition Plan: Anticipate hold tramadol and treat BP and observe ovenight.  MRI brain tomorrow.  If mental status improves and MRI normal, discharge home tomorrow or Wednesday. Consults called: None Admission status: OBS, med surg At the point of initial evaluation, it is my clinical opinion that admission for OBSERVATION is reasonable and necessary because the patient's presenting complaints in the context of their chronic conditions represent sufficient risk of deterioration or significant morbidity to constitute  reasonable grounds for close observation in the hospital setting, but that the patient may be medically stable for discharge from the hospital within 24 to 48 hours.    Medical decision making: Patient seen at 9:50 PM on 01/22/2016.  The patient was discussed with Dr. Gilford Raid. What exists of the patient's chart was reviewed in depth.  Clinical condition: stable.        Edwin Dada [facility] Pager [phone]

## 2016-01-22 NOTE — ED Notes (Signed)
Pt to x-ray & CT
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
## 2016-01-22 NOTE — ED Notes (Signed)
Pt returned from scans.  

## 2016-01-23 ENCOUNTER — Observation Stay (HOSPITAL_COMMUNITY): Payer: Medicare Other

## 2016-01-23 ENCOUNTER — Encounter (HOSPITAL_COMMUNITY): Payer: Self-pay | Admitting: Nurse Practitioner

## 2016-01-23 DIAGNOSIS — I739 Peripheral vascular disease, unspecified: Secondary | ICD-10-CM | POA: Diagnosis not present

## 2016-01-23 DIAGNOSIS — F0390 Unspecified dementia without behavioral disturbance: Secondary | ICD-10-CM | POA: Diagnosis not present

## 2016-01-23 DIAGNOSIS — R4182 Altered mental status, unspecified: Secondary | ICD-10-CM | POA: Diagnosis not present

## 2016-01-23 DIAGNOSIS — I25708 Atherosclerosis of coronary artery bypass graft(s), unspecified, with other forms of angina pectoris: Secondary | ICD-10-CM | POA: Diagnosis not present

## 2016-01-23 DIAGNOSIS — N184 Chronic kidney disease, stage 4 (severe): Secondary | ICD-10-CM | POA: Diagnosis not present

## 2016-01-23 DIAGNOSIS — D696 Thrombocytopenia, unspecified: Secondary | ICD-10-CM | POA: Diagnosis not present

## 2016-01-23 DIAGNOSIS — F329 Major depressive disorder, single episode, unspecified: Secondary | ICD-10-CM | POA: Diagnosis not present

## 2016-01-23 DIAGNOSIS — R410 Disorientation, unspecified: Secondary | ICD-10-CM

## 2016-01-23 DIAGNOSIS — G9340 Encephalopathy, unspecified: Secondary | ICD-10-CM

## 2016-01-23 DIAGNOSIS — R7989 Other specified abnormal findings of blood chemistry: Secondary | ICD-10-CM

## 2016-01-23 DIAGNOSIS — F419 Anxiety disorder, unspecified: Secondary | ICD-10-CM | POA: Diagnosis not present

## 2016-01-23 DIAGNOSIS — R748 Abnormal levels of other serum enzymes: Secondary | ICD-10-CM | POA: Diagnosis not present

## 2016-01-23 DIAGNOSIS — I2510 Atherosclerotic heart disease of native coronary artery without angina pectoris: Secondary | ICD-10-CM

## 2016-01-23 DIAGNOSIS — Z66 Do not resuscitate: Secondary | ICD-10-CM | POA: Diagnosis not present

## 2016-01-23 DIAGNOSIS — N390 Urinary tract infection, site not specified: Secondary | ICD-10-CM | POA: Diagnosis not present

## 2016-01-23 DIAGNOSIS — I1310 Hypertensive heart and chronic kidney disease without heart failure, with stage 1 through stage 4 chronic kidney disease, or unspecified chronic kidney disease: Secondary | ICD-10-CM | POA: Diagnosis not present

## 2016-01-23 LAB — BASIC METABOLIC PANEL
Anion gap: 7 (ref 5–15)
BUN: 23 mg/dL — AB (ref 6–20)
CHLORIDE: 110 mmol/L (ref 101–111)
CO2: 24 mmol/L (ref 22–32)
CREATININE: 1.63 mg/dL — AB (ref 0.61–1.24)
Calcium: 9.2 mg/dL (ref 8.9–10.3)
GFR calc Af Amer: 41 mL/min — ABNORMAL LOW (ref >60)
GFR calc non Af Amer: 35 mL/min — ABNORMAL LOW (ref >60)
GLUCOSE: 98 mg/dL (ref 65–99)
Potassium: 4.6 mmol/L (ref 3.5–5.1)
SODIUM: 141 mmol/L (ref 135–145)

## 2016-01-23 LAB — TROPONIN I
Troponin I: 0.14 ng/mL (ref <0.03)
Troponin I: 0.24 ng/mL (ref <0.03)
Troponin I: 0.26 ng/mL (ref <0.03)
Troponin I: 0.33 ng/mL (ref <0.03)

## 2016-01-23 LAB — URINE CULTURE: CULTURE: NO GROWTH

## 2016-01-23 LAB — CBC
HEMATOCRIT: 42 % (ref 39.0–52.0)
Hemoglobin: 13.5 g/dL (ref 13.0–17.0)
MCH: 29.3 pg (ref 26.0–34.0)
MCHC: 32.1 g/dL (ref 30.0–36.0)
MCV: 91.3 fL (ref 78.0–100.0)
PLATELETS: 133 10*3/uL — AB (ref 150–400)
RBC: 4.6 MIL/uL (ref 4.22–5.81)
RDW: 13.1 % (ref 11.5–15.5)
WBC: 7.7 10*3/uL (ref 4.0–10.5)

## 2016-01-23 LAB — TSH: TSH: 2.186 u[IU]/mL (ref 0.350–4.500)

## 2016-01-23 LAB — AMMONIA: AMMONIA: 22 umol/L (ref 9–35)

## 2016-01-23 LAB — VITAMIN B12: VITAMIN B 12: 869 pg/mL (ref 180–914)

## 2016-01-23 LAB — HEPARIN LEVEL (UNFRACTIONATED): HEPARIN UNFRACTIONATED: 0.29 [IU]/mL — AB (ref 0.30–0.70)

## 2016-01-23 MED ORDER — HEPARIN BOLUS VIA INFUSION
2000.0000 [IU] | Freq: Once | INTRAVENOUS | Status: AC
Start: 2016-01-23 — End: 2016-01-23
  Administered 2016-01-23: 2000 [IU] via INTRAVENOUS
  Filled 2016-01-23: qty 2000

## 2016-01-23 MED ORDER — HEPARIN (PORCINE) IN NACL 100-0.45 UNIT/ML-% IJ SOLN
750.0000 [IU]/h | INTRAMUSCULAR | Status: DC
  Administered 2016-01-23: 750 [IU]/h via INTRAVENOUS
  Filled 2016-01-23: qty 250

## 2016-01-23 MED ORDER — HEPARIN (PORCINE) IN NACL 100-0.45 UNIT/ML-% IJ SOLN
900.0000 [IU]/h | INTRAMUSCULAR | Status: DC
  Administered 2016-01-23: 900 [IU]/h via INTRAVENOUS

## 2016-01-23 MED ORDER — LISINOPRIL 20 MG PO TABS
20.0000 mg | ORAL_TABLET | Freq: Every day | ORAL | Status: DC
  Administered 2016-01-24 – 2016-01-25 (×2): 20 mg via ORAL
  Filled 2016-01-23 (×2): qty 1

## 2016-01-23 MED ORDER — LORAZEPAM 2 MG/ML IJ SOLN
1.0000 mg | Freq: Once | INTRAMUSCULAR | Status: AC
  Administered 2016-01-23: 1 mg via INTRAVENOUS
  Filled 2016-01-23: qty 1

## 2016-01-23 MED ORDER — HALOPERIDOL LACTATE 5 MG/ML IJ SOLN
2.5000 mg | Freq: Once | INTRAMUSCULAR | Status: AC
  Administered 2016-01-23: 2.5 mg via INTRAVENOUS
  Filled 2016-01-23: qty 1

## 2016-01-23 MED ORDER — HALOPERIDOL LACTATE 5 MG/ML IJ SOLN: 2.5000 mg | Freq: Four times a day (QID) | INTRAMUSCULAR | Status: DC | PRN

## 2016-01-23 NOTE — Progress Notes (Signed)
PROGRESS NOTE  Scott [name] XFG:[id number] DOB: 06/04/1923 DOA: 01/22/2016 PCP: Henrine Screws, MD  Brief History:  82 year old male with a history of coronary artery disease, CKD stage III, hypertension, hyperlipidemia, depression presented with acute mental status change for 2 days. On 01/16/2016, the patient went to the emergency department for complaints of bilateral leg pain. At that time, the patient was diagnosed with a "UTI" when he was noted to have hematuria. The patient was discharged from the emergency department with cephalexin and tramadol. Apparently, the patient's pain remains uncontrolled, and his wife increased his tramadol from 25 mg to 50 mg every 6 hours.. Around lunchtime on 01/21/2016, the patient was noted to be confused staring off at the carpet for long periods of time. Because of continued worsening of his mental status, the patient was brought to the emergency department. BMP revealed a serum creatinine 1.86 with essentially unremarkable CBC except for his chronic thrombocytopenia. CT of the brain in the emergency department revealed a possible lacunar infarct in the right thalamus favoring nonacute timeframe. There was also a low attenuation lesion in the right side of the pons. CT of abdomen and pelvis showed a large hiatus hernia and right inguinal hernia without obstruction.   Assessment/Plan: Acute encephalopathy -Likely multifactorial including tramadol and possible cerebral or cardiac ischemia, and a degree of HTN encephalopathy and volume depletion (appears dry) -Examination shows that the patient has some myoclonus/asterixis -EEG -MRI brain pending -01/22/2016 CT brain possible lacunar infarct, right thalamus -tramadol may decrease seizure threshold resulting in his staring episodes -d/c tramadol -TSH 2.186 -Check ammonia -Check B12  Elevated troponin with history of CAD and CABG -Difficult to ascertain clinically whether the patient is  having chest pain secondary to his encephalopathy -Consulted cardiology -10/14/2015 heart catheterization--100% occluded grafts, 40% mid RCA -Check echocardiogram -Continue Imdur -med record shows intolerance to BB-->bradycardia -started on heparin gtt overnight 01/23/16  Pyuria -01/16/2016 UA 6-30 WBCs -Culture was negative -Repeat UA 01/22/2016 negative pyuria -Discontinue antibiotics and observe clinically  Cognitive impairment -Advised based, the patient's wife states that he has a degree of impairment -Does not have formal diagnosis of dementia, but spouse acknowledges he needs formal eval  CKD stage III-IV -Baseline creatinine 1.6-1.9 -He is at baseline hypertension -Monitor creatinine on lisinopril  Hypertension  -Continue Imdur and lisinopril    hyperlipidemia -Continue statin  Depression -Restart Effexor  Leg pain -??difficult to ascertain presently -exam is benign presently -re-assess daily as mentation improves   Disposition Plan:   Not stable for d/c; diagnostic work up in progress Family Communication:   Wife updated on phone 9/5--Total time spent 35 minutes.  Greater than 50% spent face to face counseling and coordinating care.   Consultants:  Cardiology  Code Status:  DNR  DVT Prophylaxis:  IV Heparin    Procedures: As Listed in Progress Note Above  Antibiotics: None    Subjective: Patient is pleasantly confused. Review of systems is very limited. He denies any headache, shortness breath, abdominal pain. Remainder review of systems unobtainable secondary to patient's encephalopathy.   Objective: Vitals:   01/22/16 2108 01/22/16 2240 01/22/16 2309 01/23/16 0527  BP: 176/87 181/80 (!) 176/72 (!) 174/81  Pulse: 68 78 83 75  Resp: '16 16 16 17  '$ Temp:   98.9 F (37.2 C) 98.3 F (36.8 C)  TempSrc:   Oral Oral  SpO2: 99% 97% 97% 97%  Weight:   71 kg (156 lb 8.4 oz)  Height:   '5\' 6"'$  (1.676 m)     Intake/Output Summary (Last 24 hours) at  01/23/16 0818 Last data filed at 01/23/16 7322  Gross per 24 hour  Intake            31.38 ml  Output              120 ml  Net           -88.62 ml   Weight change:  Exam:   General:  Pt is alert, follows commands appropriately, not in acute distress  HEENT: No icterus, No thrush, No neck mass, [place]/AT  Cardiovascular: RRR, S1/S2, no rubs, no gallops  Respiratory: CTA bilaterally, no wheezing, no crackles, no rhonchi  Abdomen: Soft/+BS, non tender, non distended, no guarding  Extremities: No edema, No lymphangitis, No petechiae, No rashes, no synovitis   Data Reviewed: I have personally reviewed following labs and imaging studies Basic Metabolic Panel:  Recent Labs Lab 01/22/16 1535 01/23/16 0514  NA 138 141  K 4.5 4.6  CL 106 110  CO2 26 24  GLUCOSE 88 98  BUN 29* 23*  CREATININE 1.86* 1.63*  CALCIUM 9.5 9.2   Liver Function Tests:  Recent Labs Lab 01/22/16 1535  AST 17  ALT 18  ALKPHOS 51  BILITOT 0.7  PROT 6.5  ALBUMIN 4.0   No results for input(s): LIPASE, AMYLASE in the last 168 hours. No results for input(s): AMMONIA in the last 168 hours. Coagulation Profile: No results for input(s): INR, PROTIME in the last 168 hours. CBC:  Recent Labs Lab 01/22/16 1535 01/23/16 0514  WBC 8.4 7.7  HGB 14.3 13.5  HCT 43.6 42.0  MCV 92.0 91.3  PLT 154 133*   Cardiac Enzymes:  Recent Labs Lab 01/22/16 2343 01/23/16 0514  TROPONINI 0.26* 0.33*   BNP: Invalid input(s): POCBNP CBG:  Recent Labs Lab 01/22/16 1552  GLUCAP 79   HbA1C: No results for input(s): HGBA1C in the last 72 hours. Urine analysis:    Component Value Date/Time   COLORURINE YELLOW 01/22/2016 1900   APPEARANCEUR CLEAR 01/22/2016 1900   LABSPEC 1.016 01/22/2016 1900   PHURINE 6.0 01/22/2016 1900   GLUCOSEU NEGATIVE 01/22/2016 1900   HGBUR NEGATIVE 01/22/2016 1900   BILIRUBINUR NEGATIVE 01/22/2016 1900   KETONESUR NEGATIVE 01/22/2016 1900   PROTEINUR NEGATIVE 01/22/2016  1900   UROBILINOGEN 0.2 08/15/2012 1215   NITRITE NEGATIVE 01/22/2016 1900   LEUKOCYTESUR NEGATIVE 01/22/2016 1900   Sepsis Labs: '@LABRCNTIP'$ (procalcitonin:4,lacticidven:4) ) Recent Results (from the past 240 hour(s))  Urine culture     Status: None   Collection Time: 01/16/16  4:25 AM  Result Value Ref Range Status   Specimen Description URINE, RANDOM  Final   Special Requests ADDED [id number]  Final   Culture NO GROWTH  Final   Report Status 01/17/2016 FINAL  Final     Scheduled Meds: . acetaminophen  1,000 mg Oral TID  . aspirin EC  81 mg Oral Daily  . cephALEXin  500 mg Oral BID  . isosorbide mononitrate  30 mg Oral Daily  . lisinopril  10 mg Oral Daily  . rosuvastatin  5 mg Oral QPM  . [START ON 01/24/2016] venlafaxine XR  75 mg Oral Q breakfast   Continuous Infusions: . heparin 750 Units/hr (01/23/16 0203)    Procedures/Studies: Dg Chest 2 View  Result Date: 01/22/2016 CLINICAL DATA:  Altered mental status today. Chest pain and exertional shortness of breath. EXAM: CHEST  2 VIEW COMPARISON:  PA and lateral chest 10/13/2014 and single view of the chest 09/07/2013. FINDINGS: A large hiatal hernia is again seen. The patient is status post CABG. Left basilar atelectasis is noted. No pneumothorax or pleural effusion. No focal bony abnormality. IMPRESSION: No acute disease. Large hiatal hernia. Electronically Signed   By: Inge Rise M.D.   On: 01/22/2016 16:44   Ct Head Wo Contrast  Result Date: 01/22/2016 CLINICAL DATA:  Patient was at baseline yesterday and is unable walk today. Recent UTI. EXAM: CT HEAD WITHOUT CONTRAST TECHNIQUE: Contiguous axial images were obtained from the base of the skull through the vertex without intravenous contrast. COMPARISON:  July 11, 2012 FINDINGS: Brain: No subdural, epidural, or subarachnoid hemorrhage. Ventricles and sulci R prominent but essentially stable in the interval. The cerebellum is normal. Mild decreased attenuation in the  right side of the pons on axial image and may be artifactual versus an age-indeterminate lacunar infarct. Remainder of the brainstem is normal. The basal cisterns are widely patent. Scattered white matter changes are identified. While they are similar in the interval, there has been some mild interval worsening. There is a lacunar infarct in the right thalamus not seen previously, favored to be nonacute but not specific. Low attenuation projected over the left external capsule on axial image 19 is noted to be volume averaging off the CSF in the adjacent fissure based on coronal images. No other evidence of acute infarct or ischemia. No mass, mass effect, or midline shift. Vascular: There are calcifications within the intracranial portions of the carotid arteries. The vessel are otherwise normal in appearance. Skull: Normal. Sinuses/Orbits: Debris is again seen in the sphenoid sinus with mucosal thickening in the ethmoid, frontal, and sphenoid sinuses. The mastoid air cells and middle ears are well aerated. Other: No other acute abnormalities. IMPRESSION: 1. There is a lacunar infarct in the right thalamus not seen in 2014, age indeterminate but favored to be nonacute. Low-attenuation in the right side of the pons could be artifactual versus an age indeterminate lacunar infarct. Recommend clinical correlation. An MRI could better evaluate the brain for subtle ischemia/infarct if clinical concern persists. 2. Sinus disease as above. Electronically Signed   By: Dorise Bullion III M.D   On: 01/22/2016 17:54   Ct Abdomen Pelvis W Contrast  Result Date: 01/22/2016 CLINICAL DATA:  Severe abdominal pain EXAM: CT ABDOMEN AND PELVIS WITH CONTRAST TECHNIQUE: Multidetector CT imaging of the abdomen and pelvis was performed using the standard protocol following bolus administration of intravenous contrast. CONTRAST:  1 ISOVUE-300 IOPAMIDOL (ISOVUE-300) INJECTION 61% COMPARISON:  CT scan January 16, 2016 FINDINGS: There is a  large hiatal hernia with most of the stomach above the diaphragm. The lung bases are otherwise unchanged. No free air or free fluid. There is a right inguinal hernia containing distal small bowel without obstruction. The patient is status post cholecystectomy. The liver and portal vein are otherwise normal. Granulomata are seen in the otherwise stable spleen. Low-attenuation the spleen is stable as well of doubtful significance. The pancreas and adrenal glands are normal. The left kidney is atrophic compared to the right with no hydronephrosis or perinephric stranding. No ureteral stones. The right kidney also demonstrates no stones or masses. No hydronephrosis. No right ureteral stones. Atherosclerotic changes seen in the non aneurysmal aorta. No adenopathy. The small bowel is normal. The colon demonstrates diverticulosis without diverticulitis. The appendix is normal. No adenopathy in the pelvis. There is a TURP defect in the prostate. The bladder is mildly  distended but otherwise unremarkable. Severe degenerative changes in the spine. Delayed images demonstrate no filling defects in the bilateral renal collecting systems. IMPRESSION: 1. There is a large right inguinal hernia containing small bowel without obstruction. 2. There is a large hiatal hernia with most of the stomach above the diaphragm. Electronically Signed   By: Dorise Bullion III M.D   On: 01/22/2016 19:22   Ct Renal Stone Study  Result Date: 01/16/2016 CLINICAL DATA:  Bilateral flank and leg pain EXAM: CT ABDOMEN AND PELVIS WITHOUT CONTRAST TECHNIQUE: Multidetector CT imaging of the abdomen and pelvis was performed following the standard protocol without IV contrast. COMPARISON:  None. FINDINGS: Lower chest:  Large hiatal hernia. Hepatobiliary: Cholecystectomy.  Normal liver and bile ducts. Pancreas: Normal Spleen: Scattered calcified granulomata.  Otherwise normal. Adrenals/Urinary Tract: The adrenals and kidneys are normal in appearance.  There is no urinary calculus evident. There is no hydronephrosis or ureteral dilatation. Collecting systems and ureters appear unremarkable. The urinary bladder is remarkable only for mild bladder base impression from the prostate, as well as probable TURP defect. Stomach/Bowel: Large hiatal hernia with nearly all of the stomach in the chest. The distal antrum and pylorus are below the diaphragm. Small bowel is negative for intrinsic abnormality. No small bowel dilatation or obstruction. Extensive colonic diverticulosis. No evidence of acute diverticulitis or other acute inflammatory process. The appendix is normal. There is unobstructed small bowel within a right inguinal hernia. Vascular/Lymphatic: The abdominal aorta is normal in caliber. There is extensive atherosclerotic calcification. There is no adenopathy in the abdomen or pelvis. Reproductive: Unremarkable. Other: No bowel obstruction. No extraluminal air. No ascites. No focal inflammatory changes are evident. Musculoskeletal: No significant skeletal lesion. Moderate lumbar degenerative disc and facet changes are present. IMPRESSION: 1. Large hiatal hernia.  Most of the stomach is in the chest 2. Right inguinal hernia containing unobstructed small bowel. 3. Diverticulosis. 4. Normal appendix. No acute inflammatory changes are evident in the abdomen or pelvis. Electronically Signed   By: Andreas Newport M.D.   On: 01/16/2016 05:51   Dg Hips Bilat With Pelvis 3-4 Views  Result Date: 01/16/2016 CLINICAL DATA:  Bilateral hip pain EXAM: DG HIP (WITH OR WITHOUT PELVIS) 3-4V BILAT COMPARISON:  None. FINDINGS: No fracture or ducts location of either hip. There is mild bilateral osteoarthrosis of the hips. No pelvic fracture is visible. Visualized bowel gas pattern is normal. IMPRESSION: No acute fracture or dislocation of the hips. Mild bilateral hip osteoarthrosis. Electronically Signed   By: Ulyses Jarred M.D.   On: 01/16/2016 06:08    Chadwin Fury,  DO  [facility] Pager [phone]  If 7PM-7AM, please contact night-coverage [email or web address] Password [email or web address] 01/23/2016, 8:18 AM   LOS: 0 days

## 2016-01-23 NOTE — Progress Notes (Signed)
EEG completed; results pending.    

## 2016-01-23 NOTE — Consult Note (Signed)
Cardiology Consult    Patient ID: JHOEL STIEG MRN: [id number], DOB/AGE: 08/02/1923   Admit date: 01/22/2016 Date of Consult: 01/23/2016  Primary Physician: Henrine Screws, MD Primary Cardiologist: Lendell Caprice, MD  Requesting Provider: D. Tat, MD  Patient Profile    82 y/o ? with a h/o CAD s/p remote CABG, HTN, HL, Anemia, and CKD III, who was admitted 9/4 with AMS and found to have a mild troponin elevation.  Past Medical History   Past Medical History:  Diagnosis Date  . Anxiety   . Bradycardia    a. BB d/c'd in 2016.  [name] CAD (coronary artery disease)    a. 1983 s/p CABG [facility], [place]);  b. 2007 NSTEMI->med Rx for distal LCX dzs; c. 09/2014 NSTEMI/Cath: LIMA->LAD & diag ok, native RCA dominant and patent.   VG->RCA 100, VG->LCX 100 -->Med Rx.  . CKD (chronic kidney disease), stage III   . Claudication ([place])    BLE  . Depression   . Diastolic dysfunction    a. 10/2014 Echo: F 60-65%, Gr1 DD, mild AS, mild AI, mild to mod MR, PASP 5mHg.  .Marland KitchenDyspnea 2010   syndrome - extensive  . Erectile dysfunction   . GERD (gastroesophageal reflux disease)   . H/O hiatal hernia   . History of blood transfusion 2014  . Hx of adenomatous colonic polyps 2007  . Hyperlipidemia   . Hypertensive heart disease   . Inguinal hernia   . Iron deficiency anemia   . Shingles 1990's  . Sigmoid diverticulosis   . Upper GI bleeding 2014   Gastritis and possible duodenal ulcer seen on EGD, felt secondary to NSAIDs  . Urethral stricture 11/2008  . Vasovagal syncope 05/2006  . Weight loss     Past Surgical History:  Procedure Laterality Date  . CARDIAC CATHETERIZATION  2007   Left main 100%, RCA 70% ostial, all grafts were patent, EF 40-45 percent, distal circumflex subtotal after graft insertion, medical therapy   . CARDIAC CATHETERIZATION N/A 10/14/2014   Procedure: Left Heart Cath and Cors/Grafts Angiography;  Surgeon: JLorretta Harp MD;  [facility];  Service:  Cardiovascular;  Laterality: N/A;  . CHOLECYSTECTOMY  1980's  . CORONARY ARTERY BYPASS GRAFT  1994    LIMA-LAD, SVG-D1, SVG-OM,  SVG[place]RCA[place] . ESOPHAGOGASTRODUODENOSCOPY N/A 03/25/2013   Procedure: ESOPHAGOGASTRODUODENOSCOPY (EGD);  Surgeon: JMissy Sabins MD;  gastritis, cannot rule out duodenal ulcer   . INGUINAL HERNIA REPAIR Left   . TRANSURETHRAL RESECTION OF PROSTATE       Allergies  No Known Allergies  History of Present Illness    82y/o ? with the above complex PMH [place] and again in 09/2014 with finding of occluded VG  RCA and occluded VG  LCX.  The LIMA  LAD and native RCA were patent with adequate flow and med Rx was recommended.  He also has a h/o HTN, HL, diastolic dysfxn, CKD III, Anemia, GERD, GIB, claudication, and anxiety/depression.  He last saw Dr. VIrish Lackin 07/2015, @ which time he was doing well.    On 8/29, he was seen in the ED for c/o left lower back pain and pain in his legs.  CT was negative.  UA was notable for mod Leuks, + nitrite, and numerous RBC's.  He was dx with UTI, given a dose of rocephin and subsequently d/c'd on keflex and tramadol.  His wife says that following ER visit, he often complained of low back and leg pain, and so she was giving him tramadol 50 mg - 1/2 tab, just about every six hours.  By 9/3, he cont to have pain and so she gave him a whole tab on one occasion.  She also started to note that he was becoming confused on 9/3 and as this progressed, she brought him into the ED on 9/4.  There, he was hypertensive.  UA neg.  Lytes nl, renal fxn @ baseline, Head CT with lacunar infarcts (new since 2014).  He was admitted and subsequently found to have elevated troponins with a relatively flat trend (0.26  0.33  0.24).  MRI of the brain showed no acute findings.  EEG notable for diffuse slowing.    He denies experiencing any c/p or dyspnea either  prior to or since admission.  Per wife, his mental status has improved since admission.  Inpatient Medications    . acetaminophen  1,000 mg Oral TID  . aspirin EC  81 mg Oral Daily  . isosorbide mononitrate  30 mg Oral Daily  . lisinopril  10 mg Oral Daily  . rosuvastatin  5 mg Oral QPM  . [START ON 01/24/2016] venlafaxine XR  75 mg Oral Q breakfast    Family History    Family History  Problem Relation Age of Onset  . Hypertension Mother   . Heart disease Mother   . Heart disease Father   . Heart failure Brother   . Heart attack Neg Hx   . Stroke Neg Hx     Social History    Social History   Social History  . Marital status: Married    Spouse name: N/A  . Number of children: N/A  . Years of education: N/A   Occupational History  . Retired    Social History Main Topics  . Smoking status: Never Smoker  . Smokeless tobacco: Never Used  . Alcohol use 4.2 oz/week    7 Glasses of wine per week     Comment: wine - daily  . Drug use: No  . Sexual activity: No   Other Topics Concern  . Not on file   Social History Narrative   He lives in [facility], independent living, with his wife.     Review of Systems    General:  AMS prior to admission.  No chills, fever, night sweats or weight changes.  Cardiovascular:  No chest pain, dyspnea on exertion, edema, orthopnea, palpitations, paroxysmal nocturnal dyspnea. Dermatological: No rash, lesions/masses Respiratory: No cough, dyspnea Urologic: +++ hematuria last week, no dysuria Abdominal:   No nausea, vomiting, diarrhea, bright red blood per rectum, melena, or hematemesis Neurologic:  No visual changes, wkns. All other systems reviewed and are otherwise negative except as noted above.  Physical Exam    Blood pressure (!) 174/81, pulse 75, temperature 98.3 F (36.8 C), temperature source Oral, resp. rate 17, height '5\' 6"'$  (1.676 m), weight 156 lb 8.4 oz (71 kg), SpO2 97 %.  General: Pleasant, NAD Psych: Normal  affect. Neuro: Alert and oriented X 3 - currently, though he does not remember events leading up to hospitalization. Moves all extremities spontaneously. HEENT: Normal  Neck: Supple without bruits or JVD. Lungs:  Resp regular and unlabored, CTA. Heart: RRR no s3, s4, or murmurs. Abdomen: Soft, non-tender, non-distended, BS + x 4.  Extremities: No clubbing, cyanosis or edema. DP/PT/Radials 2+ and equal bilaterally.  Labs  Recent Labs  01/22/16 2343 01/23/16 0514 01/23/16 1033  TROPONINI 0.26* 0.33* 0.24*   Lab Results  Component Value Date   WBC 7.7 01/23/2016   HGB 13.5 01/23/2016   HCT 42.0 01/23/2016   MCV 91.3 01/23/2016   PLT 133 (L) 01/23/2016    Recent Labs Lab 01/22/16 1535 01/23/16 0514  NA 138 141  K 4.5 4.6  CL 106 110  CO2 26 24  BUN 29* 23*  CREATININE 1.86* 1.63*  CALCIUM 9.5 9.2  PROT 6.5  --   BILITOT 0.7  --   ALKPHOS 51  --   ALT 18  --   AST 17  --   GLUCOSE 88 98   Lab Results  Component Value Date   CHOL 175 10/14/2014   HDL 39 (L) 10/14/2014   LDLCALC 114 (H) 10/14/2014   TRIG 108 10/14/2014    Radiology Studies    Dg Chest 2 View  Result Date: 01/22/2016 CLINICAL DATA:  Altered mental status today. Chest pain and exertional shortness of breath. EXAM: CHEST  2 VIEW COMPARISON:  PA and lateral chest 10/13/2014 and single view of the chest 09/07/2013. FINDINGS: A large hiatal hernia is again seen. The patient is status post CABG. Left basilar atelectasis is noted. No pneumothorax or pleural effusion. No focal bony abnormality. IMPRESSION: No acute disease. Large hiatal hernia. Electronically Signed   By: Inge Rise M.D.   On: 01/22/2016 16:44   Ct Head Wo Contrast  Result Date: 01/22/2016 CLINICAL DATA:  Patient was at baseline yesterday and is unable walk today. Recent UTI. EXAM: CT HEAD WITHOUT CONTRAST TECHNIQUE: Contiguous axial images were obtained from the base of the skull through the vertex without intravenous contrast.  COMPARISON:  July 11, 2012 FINDINGS: Brain: No subdural, epidural, or subarachnoid hemorrhage. Ventricles and sulci R prominent but essentially stable in the interval. The cerebellum is normal. Mild decreased attenuation in the right side of the pons on axial image and may be artifactual versus an age-indeterminate lacunar infarct. Remainder of the brainstem is normal. The basal cisterns are widely patent. Scattered white matter changes are identified. While they are similar in the interval, there has been some mild interval worsening. There is a lacunar infarct in the right thalamus not seen previously, favored to be nonacute but not specific. Low attenuation projected over the left external capsule on axial image 19 is noted to be volume averaging off the CSF in the adjacent fissure based on coronal images. No other evidence of acute infarct or ischemia. No mass, mass effect, or midline shift. Vascular: There are calcifications within the intracranial portions of the carotid arteries. The vessel are otherwise normal in appearance. Skull: Normal. Sinuses/Orbits: Debris is again seen in the sphenoid sinus with mucosal thickening in the ethmoid, frontal, and sphenoid sinuses. The mastoid air cells and middle ears are well aerated. Other: No other acute abnormalities. IMPRESSION: 1. There is a lacunar infarct in the right thalamus not seen in 2014, age indeterminate but favored to be nonacute. Low-attenuation in the right side of the pons could be artifactual versus an age indeterminate lacunar infarct. Recommend clinical correlation. An MRI could better evaluate the brain for subtle ischemia/infarct if clinical concern persists. 2. Sinus disease as above. Electronically Signed   By: Dorise Bullion III M.D   On: 01/22/2016 17:54   Mr Brain Wo Contrast  Result Date: 01/23/2016 CLINICAL DATA:  Altered mental status. Forgetfulness. Disorientation. EXAM: MRI HEAD WITHOUT CONTRAST TECHNIQUE: Multiplanar, multiecho  pulse sequences of the brain and surrounding structures were obtained without intravenous contrast. COMPARISON:  Head CT 01/22/2016 and 07/11/2012. FINDINGS: The study suffers from considerable motion degradation. Brain: Diffusion imaging does not show any acute or subacute infarction. There chronic small-vessel ischemic changes affecting the pons. No cerebellar insult. Cerebral hemispheres show chronic small-vessel ischemic changes affecting the thalami in the cerebral hemispheric deep and subcortical white matter. No large vessel territory infarction. No mass lesion, hemorrhage, hydrocephalus or extra-axial collection. No pituitary mass. Vascular: Major vessels show flow. Skull and upper cervical spine: Negative Sinuses/Orbits: Mucosal inflammatory changes affecting the paranasal sinuses, most pronounced in the sphenoid sinus. Other: None IMPRESSION: Motion degraded exam. No acute or reversible intracranial finding. Atrophy and chronic small vessel ischemic changes throughout the brain. Mucosal inflammation of the paranasal sinuses. Electronically Signed   By: Nelson Chimes M.D.   On: 01/23/2016 14:07   Ct Abdomen Pelvis W Contrast  Result Date: 01/22/2016 CLINICAL DATA:  Severe abdominal pain EXAM: CT ABDOMEN AND PELVIS WITH CONTRAST TECHNIQUE: Multidetector CT imaging of the abdomen and pelvis was performed using the standard protocol following bolus administration of intravenous contrast. CONTRAST:  1 ISOVUE-300 IOPAMIDOL (ISOVUE-300) INJECTION 61% COMPARISON:  CT scan January 16, 2016 FINDINGS: There is a large hiatal hernia with most of the stomach above the diaphragm. The lung bases are otherwise unchanged. No free air or free fluid. There is a right inguinal hernia containing distal small bowel without obstruction. The patient is status post cholecystectomy. The liver and portal vein are otherwise normal. Granulomata are seen in the otherwise stable spleen. Low-attenuation the spleen is stable as well of  doubtful significance. The pancreas and adrenal glands are normal. The left kidney is atrophic compared to the right with no hydronephrosis or perinephric stranding. No ureteral stones. The right kidney also demonstrates no stones or masses. No hydronephrosis. No right ureteral stones. Atherosclerotic changes seen in the non aneurysmal aorta. No adenopathy. The small bowel is normal. The colon demonstrates diverticulosis without diverticulitis. The appendix is normal. No adenopathy in the pelvis. There is a TURP defect in the prostate. The bladder is mildly distended but otherwise unremarkable. Severe degenerative changes in the spine. Delayed images demonstrate no filling defects in the bilateral renal collecting systems. IMPRESSION: 1. There is a large right inguinal hernia containing small bowel without obstruction. 2. There is a large hiatal hernia with most of the stomach above the diaphragm. Electronically Signed   By: Dorise Bullion III M.D   On: 01/22/2016 19:22   Ct Renal Stone Study  Result Date: 01/16/2016 CLINICAL DATA:  Bilateral flank and leg pain EXAM: CT ABDOMEN AND PELVIS WITHOUT CONTRAST TECHNIQUE: Multidetector CT imaging of the abdomen and pelvis was performed following the standard protocol without IV contrast. COMPARISON:  None. FINDINGS: Lower chest:  Large hiatal hernia. Hepatobiliary: Cholecystectomy.  Normal liver and bile ducts. Pancreas: Normal Spleen: Scattered calcified granulomata.  Otherwise normal. Adrenals/Urinary Tract: The adrenals and kidneys are normal in appearance. There is no urinary calculus evident. There is no hydronephrosis or ureteral dilatation. Collecting systems and ureters appear unremarkable. The urinary bladder is remarkable only for mild bladder base impression from the prostate, as well as probable TURP defect. Stomach/Bowel: Large hiatal hernia with nearly all of the stomach in the chest. The distal antrum and pylorus are below the diaphragm. Small bowel is  negative for intrinsic abnormality. No small bowel dilatation or obstruction. Extensive colonic diverticulosis. No evidence of acute diverticulitis or other acute inflammatory process.  The appendix is normal. There is unobstructed small bowel within a right inguinal hernia. Vascular/Lymphatic: The abdominal aorta is normal in caliber. There is extensive atherosclerotic calcification. There is no adenopathy in the abdomen or pelvis. Reproductive: Unremarkable. Other: No bowel obstruction. No extraluminal air. No ascites. No focal inflammatory changes are evident. Musculoskeletal: No significant skeletal lesion. Moderate lumbar degenerative disc and facet changes are present. IMPRESSION: 1. Large hiatal hernia.  Most of the stomach is in the chest 2. Right inguinal hernia containing unobstructed small bowel. 3. Diverticulosis. 4. Normal appendix. No acute inflammatory changes are evident in the abdomen or pelvis. Electronically Signed   By: Andreas Newport M.D.   On: 01/16/2016 05:51   Dg Hips Bilat With Pelvis 3-4 Views  Result Date: 01/16/2016 CLINICAL DATA:  Bilateral hip pain EXAM: DG HIP (WITH OR WITHOUT PELVIS) 3-4V BILAT COMPARISON:  None. FINDINGS: No fracture or ducts location of either hip. There is mild bilateral osteoarthrosis of the hips. No pelvic fracture is visible. Visualized bowel gas pattern is normal. IMPRESSION: No acute fracture or dislocation of the hips. Mild bilateral hip osteoarthrosis. Electronically Signed   By: Ulyses Jarred M.D.   On: 01/16/2016 06:08    ECG & Cardiac Imaging    RSR, 61, no acute st/t changes.  Assessment & Plan    1.  Altered Mental Status:  Pt presented 9/4 with a 2 day h/o AMS in the setting of frequent tramadol usage for low back and leg pain.  Mental status now clearing.  CT head/MRI w/o acute findings.  Neuro on board.  2.  Elevated troponin/CAD:  H/o CABG in 1983 with most recent cath in setting of nstemi in 09/2014  patent LIMA  LAD and patent  native RCA (occluded LM, occluded VG  RCA, occluded VG  OM, occluded VG  Diag).  Med Rx since then and he has done reasonably well.  Troponin again noted to be elevated on this admission.  He denies recent c/p or dyspnea.  Wife unaware of any c/p @ home.  Trop elevation likely represents demand ischemia in setting above, recent back pain.  Would not pursue further ischemic eval. D/C heparin.  Cont asa, statin, nitrate, and acei.  No  blocker 2/2 h/o bradycardia on  blocker.  3.  Hypertensive Heart Disease:  BP's have been trending quite high - 174/81 earlier.  Titrate lisinopril to 20.  Follow creat.  Would have a low threshold to add amlodipine if pressure remains elevated after further acei titration.  4.  HL: cont statin.  5.  Recent UTI/hematuria:  F/u ua neg.  Completed abx.  Signed, Murray Hodgkins, NP 01/23/2016, 3:13 PM  I have seen, examined and evaluated the patient this PM along with Mr. Sharolyn Douglas, NP-C.  After reviewing all the available data and chart, we discussed the patients laboratory, study & physical findings as well as symptoms in detail. I agree with his findings, examination as well as impression recommendations as per our discussion.    Very pleasant elderly gentleman with known extensive coronary disease has had a relatively recent cardiac catheterization in setting of minor troponin elevations last year that was thought to be ACS. He now presents yet again with some mild troponin elevation, but with no signs symptoms to suggest ACS. He came in with confusion likely related to increased dose of tramadol. There is no suggestion of any chest tightness or pressure or dyspnea that I can tell.  It appears that his mental status has started  to clear up some, but he still remains a very poor historian himself.  At this point I would recommend that we'll try to avoid any further ischemic evaluation with either noninvasive or invasive evaluation. A demand ischemia versus simply related  to his renal insufficiency.  He was being medically managed for extensive coronary disease already. I think were fine stopping heparin as that does not seem to be any evidence of ACS. Continue aspirin and statin, nitrate, ACE inhibitor. He is not on beta blocker due to bradycardia.[name]'  If necessary for additional blood pressure control I do agree with increasing lisinopril.  At this point I don't think that we need any further cardiac evaluation. Would simply continue to treat his medical issues. We will be available for assistance, but we'll sign off for now.   Glenetta Hew, M.D., M.S. Interventional Cardiologist   Pager # [phone] Phone # [phone] [address] [place], [place] [address]

## 2016-01-23 NOTE — Progress Notes (Signed)
ANTICOAGULATION CONSULT NOTE - Initial Consult  Pharmacy Consult for Heparin Indication: chest pain/ACS  No Known Allergies  Patient Measurements: Height: '5\' 6"'$  (167.6 cm) Weight: 156 lb 8.4 oz (71 kg) IBW/kg (Calculated) : 63.8   Vital Signs: Temp: 98.9 F (37.2 C) (09/04 2309) Temp Source: Oral (09/04 2309) BP: 176/72 (09/04 2309) Pulse Rate: 83 (09/04 2309)  Labs:  Recent Labs  01/22/16 1535 01/22/16 2343  HGB 14.3  --   HCT 43.6  --   PLT 154  --   CREATININE 1.86*  --   TROPONINI  --  0.26*    Estimated Creatinine Clearance: 23.8 mL/min (by C-G formula based on SCr of 1.86 mg/dL).   Medical History: Past Medical History:  Diagnosis Date  . Anginal pain ([place]) ~ 1992  . Anxiety   . CAD (coronary artery disease)    with CABG in 1983  . Claudication ([place])    BLE  . Depression   . Dyspnea 2010   syndrome - extensive  . Erectile dysfunction   . Exertional shortness of breath   . GERD (gastroesophageal reflux disease)   . H/O hiatal hernia   . History of blood transfusion 2014  . Hx of adenomatous colonic polyps 2007  . Hyperlipidemia   . Hypertension   . Inguinal hernia   . Iron deficiency anemia   . Shingles 1990's  . Sigmoid diverticulosis   . Upper GI bleeding 2014   Gastritis and possible duodenal ulcer seen on EGD, felt secondary to NSAIDs  . Urethral stricture 11/2008  . Vasovagal syncope 05/2006  . Weight loss     Medications:  Prescriptions Prior to Admission  Medication Sig Dispense Refill Last Dose  . acetaminophen (TYLENOL) 325 MG tablet Take 2 tablets (650 mg total) by mouth every 4 (four) hours as needed for headache or mild pain.   01/22/2016 at am  . albuterol (PROAIR HFA) 108 (90 Base) MCG/ACT inhaler Inhale 2 puffs into the lungs every 6 (six) hours as needed for wheezing or shortness of breath.   never used  . aspirin EC 81 MG tablet Take 81 mg by mouth daily.   2-3 days ago  . cephALEXin (KEFLEX) 500 MG capsule Take 2 capsules  (1,000 mg total) by mouth 2 (two) times daily. (Patient taking differently: Take 1,000 mg by mouth 2 (two) times daily. 10 day course started 01/16/16) 40 capsule 0 01/22/2016 at am  . desvenlafaxine (PRISTIQ) 50 MG 24 hr tablet Take 25 mg by mouth every evening.    few days ago  . esomeprazole (NEXIUM) 40 MG capsule Take 40 mg by mouth daily as needed (acid reflux/ heartburn).   unknown  . ferrous sulfate (KP FERROUS SULFATE) 325 (65 FE) MG tablet Take 325 mg by mouth daily with breakfast.    few days ago  . isosorbide mononitrate (IMDUR) 30 MG 24 hr tablet Take 30 mg by mouth daily.   few days ago  . lisinopril (PRINIVIL,ZESTRIL) 10 MG tablet Take 1 tablet (10 mg total) by mouth daily. 90 tablet 1 few days ago  . nitroGLYCERIN (NITROSTAT) 0.4 MG SL tablet Place 1 tablet (0.4 mg total) under the tongue every 5 (five) minutes x 3 doses as needed for chest pain. 25 tablet 4 never used  . Omega-3 Fatty Acids (FISH OIL PO) Take 1 capsule by mouth daily.   few days ago  . rosuvastatin (CRESTOR) 10 MG tablet Take 5 mg by mouth every evening.    few  nights ago  . vitamin B-12 (CYANOCOBALAMIN) 1000 MCG tablet Take 1,000 mcg by mouth every evening.    few nights ago    Assessment: 82 y.o. male with elevated cardiac markers for heparin  Goal of Therapy:  Heparin level 0.3-0.7 units/ml Monitor platelets by anticoagulation protocol: Yes   Plan:  Heparin 2000 units IV bolus, then start heparin 750 units/hr Check heparin level in 8 hours.   Caryl Pina 01/23/2016,1:02 AM

## 2016-01-23 NOTE — Care Management Obs Status (Signed)
[place] NOTIFICATION   Patient Details  Name: Scott [name] MRN: [id number] Date of Birth: 1923-06-28   Medicare Observation Status Notification Given:  Yes    Flavio Lindroth, Rory Percy, RN 01/23/2016, 2:08 PM

## 2016-01-23 NOTE — Progress Notes (Signed)
CRITICAL VALUE ALERT  Critical value received:  Troponin 0.26  Date of notification:  01-23-16  Time of notification:  0034  Critical value read back:Yes.    Nurse who received alert:  Rutherford Nail  MD notified (1st page):  Lamar Blinks, NP  Time of first page:  0038  MD notified (2nd page):  Time of second page:  Responding MD:  Lamar Blinks  Time MD responded:  0040  Bud Face Rn

## 2016-01-23 NOTE — Progress Notes (Signed)
ANTICOAGULATION CONSULT NOTE -Follow up Pharmacy Consult for Heparin Indication: chest pain/ACS  No Known Allergies  Patient Measurements: Height: '5\' 6"'$  (167.6 cm) Weight: 156 lb 8.4 oz (71 kg) IBW/kg (Calculated) : 63.8  Heparin dosing weight = TBW =71 kg   Vital Signs: Temp: 98.3 F (36.8 C) (09/05 0527) Temp Source: Oral (09/05 0527) BP: 174/81 (09/05 0527) Pulse Rate: 75 (09/05 0527)  Labs:  Recent Labs  01/22/16 1535 01/22/16 2343 01/23/16 0514 01/23/16 1033  HGB 14.3  --  13.5  --   HCT 43.6  --  42.0  --   PLT 154  --  133*  --   HEPARINUNFRC  --   --   --  0.29*  CREATININE 1.86*  --  1.63*  --   TROPONINI  --  0.26* 0.33* 0.24*    Estimated Creatinine Clearance: 27.2 mL/min (by C-G formula based on SCr of 1.63 mg/dL).   Medical History: Past Medical History:  Diagnosis Date  . Anginal pain ([place]) ~ 1992  . Anxiety   . CAD (coronary artery disease)    with CABG in 1983  . Claudication ([place])    BLE  . Depression   . Dyspnea 2010   syndrome - extensive  . Erectile dysfunction   . Exertional shortness of breath   . GERD (gastroesophageal reflux disease)   . H/O hiatal hernia   . History of blood transfusion 2014  . Hx of adenomatous colonic polyps 2007  . Hyperlipidemia   . Hypertension   . Inguinal hernia   . Iron deficiency anemia   . Shingles 1990's  . Sigmoid diverticulosis   . Upper GI bleeding 2014   Gastritis and possible duodenal ulcer seen on EGD, felt secondary to NSAIDs  . Urethral stricture 11/2008  . Vasovagal syncope 05/2006  . Weight loss     Medications:  Prescriptions Prior to Admission  Medication Sig Dispense Refill Last Dose  . acetaminophen (TYLENOL) 325 MG tablet Take 2 tablets (650 mg total) by mouth every 4 (four) hours as needed for headache or mild pain.   01/22/2016 at am  . albuterol (PROAIR HFA) 108 (90 Base) MCG/ACT inhaler Inhale 2 puffs into the lungs every 6 (six) hours as needed for wheezing or shortness of  breath.   never used  . aspirin EC 81 MG tablet Take 81 mg by mouth daily.   2-3 days ago  . cephALEXin (KEFLEX) 500 MG capsule Take 2 capsules (1,000 mg total) by mouth 2 (two) times daily. (Patient taking differently: Take 1,000 mg by mouth 2 (two) times daily. 10 day course started 01/16/16) 40 capsule 0 01/22/2016 at am  . desvenlafaxine (PRISTIQ) 50 MG 24 hr tablet Take 25 mg by mouth every evening.    few days ago  . esomeprazole (NEXIUM) 40 MG capsule Take 40 mg by mouth daily as needed (acid reflux/ heartburn).   unknown  . ferrous sulfate (KP FERROUS SULFATE) 325 (65 FE) MG tablet Take 325 mg by mouth daily with breakfast.    few days ago  . isosorbide mononitrate (IMDUR) 30 MG 24 hr tablet Take 30 mg by mouth daily.   few days ago  . lisinopril (PRINIVIL,ZESTRIL) 10 MG tablet Take 1 tablet (10 mg total) by mouth daily. 90 tablet 1 few days ago  . nitroGLYCERIN (NITROSTAT) 0.4 MG SL tablet Place 1 tablet (0.4 mg total) under the tongue every 5 (five) minutes x 3 doses as needed for chest pain. 25 tablet  4 never used  . Omega-3 Fatty Acids (FISH OIL PO) Take 1 capsule by mouth daily.   few days ago  . rosuvastatin (CRESTOR) 10 MG tablet Take 5 mg by mouth every evening.    few nights ago  . vitamin B-12 (CYANOCOBALAMIN) 1000 MCG tablet Take 1,000 mcg by mouth every evening.    few nights ago    Assessment: 82 y.o. male with mental status change and elevated cardiac markers with history of CAD and CABG. He was started on IV heparin infusion  per pharmacy consult- for r/o ACS/ ?chest pain. The initial  8 hour heparin level = 0.29 on rate 750 units/hr.   SUBtherapeutic level, almost at goal 0.3-0.7 units/ml.  H/H within normal limits/stable, PLTC slight decrease to 133K. No bleeding reported.   MRI brain pending -01/22/2016 CT brain possible lacunar infarct, right thalamus  Goal of Therapy:  Heparin level 0.3-0.7 units/ml Monitor platelets by anticoagulation protocol: Yes   Plan:   Increase heparin drip rate to 900 units/hr Check heparin level in 6 hours.  Daily heparin level and CBC  Nicole Cella, RPh Clinical Pharmacist Pager: [phone] 01/23/2016,12:17 PM

## 2016-01-23 NOTE — Procedures (Signed)
ELECTROENCEPHALOGRAM REPORT  Date of Study: 01/23/2016  Patient's Name: Scott [name] MRN: [id number] Date of Birth: 05/19/1924  Referring Provider: Dr. Shanon Brow Tat  Clinical History: This is a 82 year old man with mental status change.   Medications: acetaminophen (TYLENOL) tablet 1,000 mg  aspirin EC tablet 81 mg  haloperidol lactate (HALDOL) injection 2.5 mg  isosorbide mononitrate (IMDUR) 24 hr tablet 30 mg  lisinopril (PRINIVIL,ZESTRIL) tablet 10 mg  pantoprazole (PROTONIX) EC tablet 80 mg  rosuvastatin (CRESTOR) tablet 5 mg  venlafaxine XR (EFFEXOR-XR) 24 hr capsule 75 mg   Technical Summary: A multichannel digital EEG recording measured by the international 10-20 system with electrodes applied with paste and impedances below 5000 ohms performed as portable with EKG monitoring in an awake and drowsy patient.  Hyperventilation and photic stimulation were not performed.  The digital EEG was referentially recorded, reformatted, and digitally filtered in a variety of bipolar and referential montages for optimal display.   Description: The patient is awake and drowsy during the recording. He is noted to be confused.  During maximal wakefulness, there is a symmetric, medium voltage 6-7 Hz posterior dominant rhythm that attenuates with eye opening. This is admixed with a moderate amount of diffuse 4-5 Hz theta and occasional 2-3 Hz delta slowing of the waking background.  During drowsiness, there is an increase in theta and delta slowing of the background. Normal sleep architecture was not seen. Hyperventilation and photic stimulation were not performed.  There were no epileptiform discharges or electrographic seizures seen.    EKG lead was unremarkable.  Impression: This awake and drowsy EEG is abnormal due to moderate diffuse slowing of the waking background.  Clinical Correlation of the above findings indicates diffuse cerebral dysfunction that is non-specific in etiology and can  be seen with hypoxic/ischemic injury, toxic/metabolic encephalopathies, neurodegenerative disorders, or medication effect.  The absence of epileptiform discharges does not rule out a clinical diagnosis of epilepsy.  Clinical correlation is advised.   Ellouise Newer, M.D.

## 2016-01-23 NOTE — Progress Notes (Addendum)
Follow-up: Notified by RN regarding elevated troponin of 0.26. Pt remains confused but RN reports no objective s/s pain. VSS.  Assessment/Plan: 1. Elevated troponin: Significant h/o CAD, NSTEMI, CABG in 1983 w/ diagnostic coronary arteriography in 2016 which revealed occluded vein grafts. Record indicates some h/o chronically elevated troponin's as well. Discussed pt w/ Dr Percival Spanish w/ cardiology service who recommended Heparin qtt and echo in am. Will continue to cycle troponin's and monitor closely on telemetry. Repeat EKG in am.  Jeryl Columbia, NP-C [facility] Pager [phone]

## 2016-01-24 ENCOUNTER — Observation Stay (HOSPITAL_BASED_OUTPATIENT_CLINIC_OR_DEPARTMENT_OTHER): Payer: Medicare Other

## 2016-01-24 DIAGNOSIS — Z66 Do not resuscitate: Secondary | ICD-10-CM | POA: Diagnosis present

## 2016-01-24 DIAGNOSIS — R4182 Altered mental status, unspecified: Secondary | ICD-10-CM | POA: Diagnosis present

## 2016-01-24 DIAGNOSIS — I2583 Coronary atherosclerosis due to lipid rich plaque: Secondary | ICD-10-CM | POA: Diagnosis present

## 2016-01-24 DIAGNOSIS — R410 Disorientation, unspecified: Secondary | ICD-10-CM | POA: Diagnosis not present

## 2016-01-24 DIAGNOSIS — Z79899 Other long term (current) drug therapy: Secondary | ICD-10-CM | POA: Diagnosis not present

## 2016-01-24 DIAGNOSIS — I1310 Hypertensive heart and chronic kidney disease without heart failure, with stage 1 through stage 4 chronic kidney disease, or unspecified chronic kidney disease: Secondary | ICD-10-CM | POA: Diagnosis present

## 2016-01-24 DIAGNOSIS — R7889 Finding of other specified substances, not normally found in blood: Secondary | ICD-10-CM | POA: Diagnosis not present

## 2016-01-24 DIAGNOSIS — Z9049 Acquired absence of other specified parts of digestive tract: Secondary | ICD-10-CM | POA: Diagnosis not present

## 2016-01-24 DIAGNOSIS — G9340 Encephalopathy, unspecified: Secondary | ICD-10-CM | POA: Diagnosis not present

## 2016-01-24 DIAGNOSIS — F0390 Unspecified dementia without behavioral disturbance: Secondary | ICD-10-CM | POA: Diagnosis present

## 2016-01-24 DIAGNOSIS — K4090 Unilateral inguinal hernia, without obstruction or gangrene, not specified as recurrent: Secondary | ICD-10-CM | POA: Diagnosis present

## 2016-01-24 DIAGNOSIS — E785 Hyperlipidemia, unspecified: Secondary | ICD-10-CM | POA: Diagnosis present

## 2016-01-24 DIAGNOSIS — K219 Gastro-esophageal reflux disease without esophagitis: Secondary | ICD-10-CM | POA: Diagnosis present

## 2016-01-24 DIAGNOSIS — Z8249 Family history of ischemic heart disease and other diseases of the circulatory system: Secondary | ICD-10-CM | POA: Diagnosis not present

## 2016-01-24 DIAGNOSIS — F419 Anxiety disorder, unspecified: Secondary | ICD-10-CM | POA: Diagnosis present

## 2016-01-24 DIAGNOSIS — I10 Essential (primary) hypertension: Secondary | ICD-10-CM | POA: Diagnosis not present

## 2016-01-24 DIAGNOSIS — I739 Peripheral vascular disease, unspecified: Secondary | ICD-10-CM | POA: Diagnosis present

## 2016-01-24 DIAGNOSIS — N184 Chronic kidney disease, stage 4 (severe): Secondary | ICD-10-CM | POA: Diagnosis present

## 2016-01-24 DIAGNOSIS — I252 Old myocardial infarction: Secondary | ICD-10-CM | POA: Diagnosis not present

## 2016-01-24 DIAGNOSIS — F329 Major depressive disorder, single episode, unspecified: Secondary | ICD-10-CM | POA: Diagnosis present

## 2016-01-24 DIAGNOSIS — N189 Chronic kidney disease, unspecified: Secondary | ICD-10-CM | POA: Diagnosis not present

## 2016-01-24 DIAGNOSIS — R748 Abnormal levels of other serum enzymes: Secondary | ICD-10-CM | POA: Diagnosis present

## 2016-01-24 DIAGNOSIS — N390 Urinary tract infection, site not specified: Secondary | ICD-10-CM | POA: Diagnosis present

## 2016-01-24 DIAGNOSIS — K449 Diaphragmatic hernia without obstruction or gangrene: Secondary | ICD-10-CM | POA: Diagnosis present

## 2016-01-24 DIAGNOSIS — I2510 Atherosclerotic heart disease of native coronary artery without angina pectoris: Secondary | ICD-10-CM | POA: Diagnosis present

## 2016-01-24 DIAGNOSIS — D696 Thrombocytopenia, unspecified: Secondary | ICD-10-CM | POA: Diagnosis present

## 2016-01-24 DIAGNOSIS — Z7982 Long term (current) use of aspirin: Secondary | ICD-10-CM | POA: Diagnosis not present

## 2016-01-24 LAB — BASIC METABOLIC PANEL
Anion gap: 13 (ref 5–15)
BUN: 25 mg/dL — AB (ref 6–20)
CHLORIDE: 105 mmol/L (ref 101–111)
CO2: 22 mmol/L (ref 22–32)
CREATININE: 1.69 mg/dL — AB (ref 0.61–1.24)
Calcium: 8.8 mg/dL — ABNORMAL LOW (ref 8.9–10.3)
GFR calc Af Amer: 39 mL/min — ABNORMAL LOW (ref >60)
GFR calc non Af Amer: 34 mL/min — ABNORMAL LOW (ref >60)
GLUCOSE: 99 mg/dL (ref 65–99)
POTASSIUM: 4 mmol/L (ref 3.5–5.1)
SODIUM: 140 mmol/L (ref 135–145)

## 2016-01-24 LAB — CBC
HCT: 40.2 % (ref 39.0–52.0)
HEMOGLOBIN: 12.9 g/dL — AB (ref 13.0–17.0)
MCH: 29.4 pg (ref 26.0–34.0)
MCHC: 32.1 g/dL (ref 30.0–36.0)
MCV: 91.6 fL (ref 78.0–100.0)
PLATELETS: 139 10*3/uL — AB (ref 150–400)
RBC: 4.39 MIL/uL (ref 4.22–5.81)
RDW: 13.2 % (ref 11.5–15.5)
WBC: 7.9 10*3/uL (ref 4.0–10.5)

## 2016-01-24 LAB — ECHOCARDIOGRAM COMPLETE
Height: 66 in
WEIGHTICAEL: 2451.52 [oz_av]

## 2016-01-24 MED ORDER — ENOXAPARIN SODIUM 30 MG/0.3ML ~~LOC~~ SOLN
30.0000 mg | SUBCUTANEOUS | Status: DC
  Administered 2016-01-24: 30 mg via SUBCUTANEOUS
  Filled 2016-01-24: qty 0.3

## 2016-01-24 NOTE — Progress Notes (Signed)
PROGRESS NOTE  Scott [name] UTM:[id number] DOB: Jul 14, 1923 DOA: 01/22/2016 PCP: Henrine Screws, MD  Brief History:  82 year old male with a history of coronary artery disease, CKD stage III, hypertension, hyperlipidemia, depression presented with acute mental status change for 2 days. On 01/16/2016, the patient went to the emergency department for complaints of bilateral leg pain. At that time, the patient was diagnosed with a "UTI" when he was noted to have hematuria. The patient was discharged from the emergency department with cephalexin and tramadol. Apparently, the patient's pain remained uncontrolled, and his wife increased his tramadol from 25 mg to 50 mg every 6 hours.. Around lunchtime on 01/21/2016, the patient was noted to be confused staring off at the carpet for long periods of time. Because of continued worsening of his mental status, the patient was brought to the emergency department. BMP revealed a serum creatinine 1.86 with essentially unremarkable CBC except for his chronic thrombocytopenia. CT of the brain in the emergency department revealed a possible lacunar infarct in the right thalamus favoring nonacute timeframe. There was also a low attenuation lesion in the right side of the pons. CT of abdomen and pelvis showed a large hiatus hernia and right inguinal hernia without obstruction. Patient had some confusion even prior to ED visit. Since admission, workup has been unrevealing otherwise. Remains confused on 9/6. Neurology consulted.  Assessment/Plan: Acute encephalopathy -Likely multifactorial including tramadol and possible cerebral or cardiac ischemia, and a degree of HTN encephalopathy and volume depletion (appears dry) -Examination showed that the patient has some myoclonus/asterixis -EEG 01/23/16: Abnormal due to moderate diffuse slowing of the waking background. Nonspecific in etiology. No epileptiform discharges noted. -MRI brain: No acute or reversible  intracranial findings. -01/22/2016 CT brain possible lacunar infarct, right thalamus -tramadol may decrease seizure threshold resulting in his staring episodes. Tramadol discontinued -TSH 2.186 -Ammonia: 22, B12: 869 and urine culture negative. - Persistent confusion,? Related to residual effects of tramadol complicating chronic kidney disease in elderly patient with underlying mild-to-moderate dementia. - Neurology consulted on 9/6 for assistance.  Elevated troponin with history of CAD and CABG -Difficult to ascertain clinically whether the patient is having chest pain secondary to his encephalopathy -10/14/2015 heart catheterization--100% occluded grafts, 40% mid RCA -Check echocardiogram -Continue Imdur -med record shows intolerance to BB-->bradycardia - Cardiology consultation 9/5 appreciated: Troponin elevation likely represents demand ischemia and would not pursue further ischemic evaluation. Heparin was discontinued. Continue aspirin, statins, nitrates and ACEI. No beta blockers secondary to history of bradycardia on beta blockers.  Pyuria -01/16/2016 UA 6-30 WBCs -Culture was negative -Repeat UA 01/22/2016 negative pyuria. -Discontinued antibiotics and observe clinically  Cognitive impairment/dementia -Advised based, the patient's wife states that he has a degree of impairment -Does not have formal diagnosis of dementia, but spouse acknowledges he needs formal eval  CKD stage III-IV -Baseline creatinine 1.6-1.9 -He is at baseline hypertension -Monitor creatinine on lisinopril. Creatinine stable in the 1.6 range.  Hypertension  -Continue Imdur and lisinopril . Reasonable inpatient control  Hyperlipidemia -Continue statin  Depression -Restart Effexor  Leg pain -??difficult to ascertain presently. Apparently has been a chronic problem followed by his PCP. -exam is benign presently -re-assess daily as mentation improves  Thrombocytopenia - Stable.   Disposition  Plan:   Not stable for d/c; diagnostic work up in progress Family Communication:   Discussed with patient's spouse and daughter at bedside in detail. Updated care and answered questions.   Consultants:  Cardiology  Code Status:  DNR  DVT Prophylaxis:  Lovenox   Procedures: As Listed in Progress Note Above  Antibiotics: None    Subjective: Patient is pleasantly confused and unable to provide history. As per spouse and daughter at bedside, patient was much more coherent yesterday and has worsened again today. No pain reported.  Objective: Vitals:   01/23/16 1821 01/23/16 2020 01/24/16 0634 01/24/16 0859  BP: (!) 105/59 101/60 (!) 139/91 (!) 154/69  Pulse: 96 67 70 74  Resp: '18 18 16 16  '$ Temp: 97.8 F (36.6 C) 97.6 F (36.4 C) 98.6 F (37 C) 97.6 F (36.4 C)  TempSrc: Oral Oral Oral Oral  SpO2: 97% 95% 100% 97%  Weight:  69.5 kg (153 lb 3.5 oz)    Height:        Intake/Output Summary (Last 24 hours) at 01/24/16 1748 Last data filed at 01/24/16 1732  Gross per 24 hour  Intake              630 ml  Output                0 ml  Net              630 ml   Weight change: -1.5 kg (-3 lb 4.9 oz) Exam:   General:  Elderly frail male patient lying comfortably supine in bed. Fidgeting but does not appear in pain or any distress.  HEENT: No icterus, No thrush, No neck mass, [place]/AT  Cardiovascular: RRR, S1/S2, no rubs, no gallops. Not on telemetry  Respiratory: CTA bilaterally, no wheezing, no crackles, no rhonchi  Abdomen: Soft/+BS, non tender, non distended, no guarding  Extremities: No edema, No lymphangitis, No petechiae, No rashes, no synovitis  CNS: Oriented only to self. No cranial nerve or focal muscle deficits. Neck supple.   Data Reviewed: I have personally reviewed following labs and imaging studies Basic Metabolic Panel:  Recent Labs Lab 01/22/16 1535 01/23/16 0514 01/24/16 0434  NA 138 141 140  K 4.5 4.6 4.0  CL 106 110 105  CO2 '26 24 22    '$ GLUCOSE 88 98 99  BUN 29* 23* 25*  CREATININE 1.86* 1.63* 1.69*  CALCIUM 9.5 9.2 8.8*   Liver Function Tests:  Recent Labs Lab 01/22/16 1535  AST 17  ALT 18  ALKPHOS 51  BILITOT 0.7  PROT 6.5  ALBUMIN 4.0   No results for input(s): LIPASE, AMYLASE in the last 168 hours.  Recent Labs Lab 01/23/16 1033  AMMONIA 22   Coagulation Profile: No results for input(s): INR, PROTIME in the last 168 hours. CBC:  Recent Labs Lab 01/22/16 1535 01/23/16 0514 01/24/16 0434  WBC 8.4 7.7 7.9  HGB 14.3 13.5 12.9*  HCT 43.6 42.0 40.2  MCV 92.0 91.3 91.6  PLT 154 133* 139*   Cardiac Enzymes:  Recent Labs Lab 01/22/16 2343 01/23/16 0514 01/23/16 1033 01/23/16 1645  TROPONINI 0.26* 0.33* 0.24* 0.14*   BNP: Invalid input(s): POCBNP CBG:  Recent Labs Lab 01/22/16 1552  GLUCAP 79   HbA1C: No results for input(s): HGBA1C in the last 72 hours. Urine analysis:    Component Value Date/Time   COLORURINE YELLOW 01/22/2016 1900   APPEARANCEUR CLEAR 01/22/2016 1900   LABSPEC 1.016 01/22/2016 1900   PHURINE 6.0 01/22/2016 1900   GLUCOSEU NEGATIVE 01/22/2016 1900   HGBUR NEGATIVE 01/22/2016 1900   BILIRUBINUR NEGATIVE 01/22/2016 1900   KETONESUR NEGATIVE 01/22/2016 1900   PROTEINUR NEGATIVE 01/22/2016 1900   UROBILINOGEN 0.2 08/15/2012  [place] 01/22/2016 [place] 01/22/2016 1900   Sepsis Labs: '@LABRCNTIP'$ (procalcitonin:4,lacticidven:4) ) Recent Results (from the past 240 hour(s))  Urine culture     Status: None   Collection Time: 01/16/16  4:25 AM  Result Value Ref Range Status   Specimen Description URINE, RANDOM  Final   Special Requests ADDED [id number]  Final   Culture NO GROWTH  Final   Report Status 01/17/2016 FINAL  Final  Urine culture     Status: None   Collection Time: 01/22/16  7:00 PM  Result Value Ref Range Status   Specimen Description URINE, RANDOM  Final   Special Requests NONE  Final   Culture NO GROWTH   Final   Report Status 01/23/2016 FINAL  Final     Scheduled Meds: . acetaminophen  1,000 mg Oral TID  . aspirin EC  81 mg Oral Daily  . isosorbide mononitrate  30 mg Oral Daily  . lisinopril  20 mg Oral Daily  . rosuvastatin  5 mg Oral QPM  . venlafaxine XR  75 mg Oral Q breakfast   Continuous Infusions:    Procedures/Studies: Dg Chest 2 View  Result Date: 01/22/2016 CLINICAL DATA:  Altered mental status today. Chest pain and exertional shortness of breath. EXAM: CHEST  2 VIEW COMPARISON:  PA and lateral chest 10/13/2014 and single view of the chest 09/07/2013. FINDINGS: A large hiatal hernia is again seen. The patient is status post CABG. Left basilar atelectasis is noted. No pneumothorax or pleural effusion. No focal bony abnormality. IMPRESSION: No acute disease. Large hiatal hernia. Electronically Signed   By: Inge Rise M.D.   On: 01/22/2016 16:44   Ct Head Wo Contrast  Result Date: 01/22/2016 CLINICAL DATA:  Patient was at baseline yesterday and is unable walk today. Recent UTI. EXAM: CT HEAD WITHOUT CONTRAST TECHNIQUE: Contiguous axial images were obtained from the base of the skull through the vertex without intravenous contrast. COMPARISON:  July 11, 2012 FINDINGS: Brain: No subdural, epidural, or subarachnoid hemorrhage. Ventricles and sulci R prominent but essentially stable in the interval. The cerebellum is normal. Mild decreased attenuation in the right side of the pons on axial image and may be artifactual versus an age-indeterminate lacunar infarct. Remainder of the brainstem is normal. The basal cisterns are widely patent. Scattered white matter changes are identified. While they are similar in the interval, there has been some mild interval worsening. There is a lacunar infarct in the right thalamus not seen previously, favored to be nonacute but not specific. Low attenuation projected over the left external capsule on axial image 19 is noted to be volume averaging off  the CSF in the adjacent fissure based on coronal images. No other evidence of acute infarct or ischemia. No mass, mass effect, or midline shift. Vascular: There are calcifications within the intracranial portions of the carotid arteries. The vessel are otherwise normal in appearance. Skull: Normal. Sinuses/Orbits: Debris is again seen in the sphenoid sinus with mucosal thickening in the ethmoid, frontal, and sphenoid sinuses. The mastoid air cells and middle ears are well aerated. Other: No other acute abnormalities. IMPRESSION: 1. There is a lacunar infarct in the right thalamus not seen in 2014, age indeterminate but favored to be nonacute. Low-attenuation in the right side of the pons could be artifactual versus an age indeterminate lacunar infarct. Recommend clinical correlation. An MRI could better evaluate the brain for subtle ischemia/infarct if clinical concern persists. 2. Sinus disease as above.  Electronically Signed   By: Dorise Bullion III M.D   On: 01/22/2016 17:54   Mr Brain Wo Contrast  Result Date: 01/23/2016 CLINICAL DATA:  Altered mental status. Forgetfulness. Disorientation. EXAM: MRI HEAD WITHOUT CONTRAST TECHNIQUE: Multiplanar, multiecho pulse sequences of the brain and surrounding structures were obtained without intravenous contrast. COMPARISON:  Head CT 01/22/2016 and 07/11/2012. FINDINGS: The study suffers from considerable motion degradation. Brain: Diffusion imaging does not show any acute or subacute infarction. There chronic small-vessel ischemic changes affecting the pons. No cerebellar insult. Cerebral hemispheres show chronic small-vessel ischemic changes affecting the thalami in the cerebral hemispheric deep and subcortical white matter. No large vessel territory infarction. No mass lesion, hemorrhage, hydrocephalus or extra-axial collection. No pituitary mass. Vascular: Major vessels show flow. Skull and upper cervical spine: Negative Sinuses/Orbits: Mucosal inflammatory changes  affecting the paranasal sinuses, most pronounced in the sphenoid sinus. Other: None IMPRESSION: Motion degraded exam. No acute or reversible intracranial finding. Atrophy and chronic small vessel ischemic changes throughout the brain. Mucosal inflammation of the paranasal sinuses. Electronically Signed   By: Nelson Chimes M.D.   On: 01/23/2016 14:07   Ct Abdomen Pelvis W Contrast  Result Date: 01/22/2016 CLINICAL DATA:  Severe abdominal pain EXAM: CT ABDOMEN AND PELVIS WITH CONTRAST TECHNIQUE: Multidetector CT imaging of the abdomen and pelvis was performed using the standard protocol following bolus administration of intravenous contrast. CONTRAST:  1 ISOVUE-300 IOPAMIDOL (ISOVUE-300) INJECTION 61% COMPARISON:  CT scan January 16, 2016 FINDINGS: There is a large hiatal hernia with most of the stomach above the diaphragm. The lung bases are otherwise unchanged. No free air or free fluid. There is a right inguinal hernia containing distal small bowel without obstruction. The patient is status post cholecystectomy. The liver and portal vein are otherwise normal. Granulomata are seen in the otherwise stable spleen. Low-attenuation the spleen is stable as well of doubtful significance. The pancreas and adrenal glands are normal. The left kidney is atrophic compared to the right with no hydronephrosis or perinephric stranding. No ureteral stones. The right kidney also demonstrates no stones or masses. No hydronephrosis. No right ureteral stones. Atherosclerotic changes seen in the non aneurysmal aorta. No adenopathy. The small bowel is normal. The colon demonstrates diverticulosis without diverticulitis. The appendix is normal. No adenopathy in the pelvis. There is a TURP defect in the prostate. The bladder is mildly distended but otherwise unremarkable. Severe degenerative changes in the spine. Delayed images demonstrate no filling defects in the bilateral renal collecting systems. IMPRESSION: 1. There is a large right  inguinal hernia containing small bowel without obstruction. 2. There is a large hiatal hernia with most of the stomach above the diaphragm. Electronically Signed   By: Dorise Bullion III M.D   On: 01/22/2016 19:22   Ct Renal Stone Study  Result Date: 01/16/2016 CLINICAL DATA:  Bilateral flank and leg pain EXAM: CT ABDOMEN AND PELVIS WITHOUT CONTRAST TECHNIQUE: Multidetector CT imaging of the abdomen and pelvis was performed following the standard protocol without IV contrast. COMPARISON:  None. FINDINGS: Lower chest:  Large hiatal hernia. Hepatobiliary: Cholecystectomy.  Normal liver and bile ducts. Pancreas: Normal Spleen: Scattered calcified granulomata.  Otherwise normal. Adrenals/Urinary Tract: The adrenals and kidneys are normal in appearance. There is no urinary calculus evident. There is no hydronephrosis or ureteral dilatation. Collecting systems and ureters appear unremarkable. The urinary bladder is remarkable only for mild bladder base impression from the prostate, as well as probable TURP defect. Stomach/Bowel: Large hiatal hernia with nearly all  of the stomach in the chest. The distal antrum and pylorus are below the diaphragm. Small bowel is negative for intrinsic abnormality. No small bowel dilatation or obstruction. Extensive colonic diverticulosis. No evidence of acute diverticulitis or other acute inflammatory process. The appendix is normal. There is unobstructed small bowel within a right inguinal hernia. Vascular/Lymphatic: The abdominal aorta is normal in caliber. There is extensive atherosclerotic calcification. There is no adenopathy in the abdomen or pelvis. Reproductive: Unremarkable. Other: No bowel obstruction. No extraluminal air. No ascites. No focal inflammatory changes are evident. Musculoskeletal: No significant skeletal lesion. Moderate lumbar degenerative disc and facet changes are present. IMPRESSION: 1. Large hiatal hernia.  Most of the stomach is in the chest 2. Right  inguinal hernia containing unobstructed small bowel. 3. Diverticulosis. 4. Normal appendix. No acute inflammatory changes are evident in the abdomen or pelvis. Electronically Signed   By: Andreas Newport M.D.   On: 01/16/2016 05:51   Dg Hips Bilat With Pelvis 3-4 Views  Result Date: 01/16/2016 CLINICAL DATA:  Bilateral hip pain EXAM: DG HIP (WITH OR WITHOUT PELVIS) 3-4V BILAT COMPARISON:  None. FINDINGS: No fracture or ducts location of either hip. There is mild bilateral osteoarthrosis of the hips. No pelvic fracture is visible. Visualized bowel gas pattern is normal. IMPRESSION: No acute fracture or dislocation of the hips. Mild bilateral hip osteoarthrosis. Electronically Signed   By: Ulyses Jarred M.D.   On: 01/16/2016 06:08    Lawan Nanez, MD, FACP, FHM. [facility] Pager [phone]  If 7PM-7AM, please contact night-coverage [email or web address] Password [email or web address] 01/24/2016, 6:05 PM   LOS: 0 days

## 2016-01-24 NOTE — Progress Notes (Signed)
  Echocardiogram 2D Echocardiogram has been performed.  Scott [name] 01/24/2016, 2:52 PM

## 2016-01-24 NOTE — Consult Note (Signed)
NEURO HOSPITALIST CONSULT NOTE   Requestig physician: Dr. Algis Liming   Reason for Consult: AMS    HPI:                                                                                                                                          Scott [name] is an 82 y.o. male with approximately 3+ years of progressively worsening memory issues per daughter. Patient has not driven a car, taking care of the bills, made a meal for approximately 3 years and this was due to his worsening memory. Patient was recently seen in the hospital for confusion and found to have a UTI twitch was treated with Rocephin. Per daughter patient had returned home however he remained somewhat confused as he would wake up in the middle the night and would not go back to sleep. Per daughter he apparently was able to go for walks but as per his baseline she is unsure if he actually fully return to baseline. Patient was brought back to the hospital due to increased confusion. It was also noted in the chart the patient still had significant pain and was taking multiple doses of tramadol. Currently on consultation patient is able to follow my commands but only simple commands. He is unable to tell me where he is, the month, or the year. He is able to name objects without difficulty. Daughter states that she has not noted any hallucinations either auditory or visual.  Of note looking up Keflex which she was placed on discharge. One of the common side effects is increased confusion.  Creatinine 1.69  Calcium 8.8 Vitamin B12 869 MRI brain obtained and showed no acute abnormalities however this was slightly motion degraded.  Past Medical History:  Diagnosis Date  . Anxiety   . Bradycardia    a. BB d/c'd in 2016.  [name] CAD (coronary artery disease)    a. 1983 s/p CABG [facility] [place]);  b. 2007 NSTEMI->med Rx for distal LCX dzs; c. 09/2014 NSTEMI/Cath: LIMA->LAD & diag ok, native RCA dominant and patent.    VG->RCA 100, VG->LCX 100 -->Med Rx.  . CKD (chronic kidney disease), stage III   . Claudication ([place])    BLE  . Depression   . Diastolic dysfunction    a. 10/2014 Echo: F 60-65%, Gr1 DD, mild AS, mild AI, mild to mod MR, PASP 90mHg.  .Marland KitchenDyspnea 2010   syndrome - extensive  . Erectile dysfunction   . GERD (gastroesophageal reflux disease)   . H/O hiatal hernia   . History of blood transfusion 2014  . Hx of adenomatous colonic polyps 2007  . Hyperlipidemia   . Hypertensive heart disease   . Inguinal hernia   . Iron deficiency anemia   . Shingles 1990's  .  Sigmoid diverticulosis   . Upper GI bleeding 2014   Gastritis and possible duodenal ulcer seen on EGD, felt secondary to NSAIDs  . Urethral stricture 11/2008  . Vasovagal syncope 05/2006  . Weight loss     Past Surgical History:  Procedure Laterality Date  . CARDIAC CATHETERIZATION  2007   Left main 100%, RCA 70% ostial, all grafts were patent, EF 40-45 percent, distal circumflex subtotal after graft insertion, medical therapy   . CARDIAC CATHETERIZATION N/A 10/14/2014   Procedure: Left Heart Cath and Cors/Grafts Angiography;  Surgeon: Lorretta Harp, MD;  Location: [facility];  Service: Cardiovascular;  Laterality: N/A;  . CHOLECYSTECTOMY  1980's  . CORONARY ARTERY BYPASS GRAFT  1994    LIMA-LAD, SVG-D1, SVG-OM,  SVG-RCA; performed in [place], [place]  . ESOPHAGOGASTRODUODENOSCOPY N/A 03/25/2013   Procedure: ESOPHAGOGASTRODUODENOSCOPY (EGD);  Surgeon: Missy Sabins, MD;  gastritis, cannot rule out duodenal ulcer   . INGUINAL HERNIA REPAIR Left   . TRANSURETHRAL RESECTION OF PROSTATE      Family History  Problem Relation Age of Onset  . Hypertension Mother   . Heart disease Mother   . Heart disease Father   . Heart failure Brother   . Heart attack Neg Hx   . Stroke Neg Hx      Social History:  reports that he has never smoked. He has never used smokeless tobacco. He reports that he drinks about 4.2 oz of  alcohol per week . He reports that he does not use drugs.  No Known Allergies  MEDICATIONS:                                                                                                                     Prior to Admission:  Prescriptions Prior to Admission  Medication Sig Dispense Refill Last Dose  . acetaminophen (TYLENOL) 325 MG tablet Take 2 tablets (650 mg total) by mouth every 4 (four) hours as needed for headache or mild pain.   01/22/2016 at am  . albuterol (PROAIR HFA) 108 (90 Base) MCG/ACT inhaler Inhale 2 puffs into the lungs every 6 (six) hours as needed for wheezing or shortness of breath.   never used  . aspirin EC 81 MG tablet Take 81 mg by mouth daily.   2-3 days ago  . cephALEXin (KEFLEX) 500 MG capsule Take 2 capsules (1,000 mg total) by mouth 2 (two) times daily. (Patient taking differently: Take 1,000 mg by mouth 2 (two) times daily. 10 day course started 01/16/16) 40 capsule 0 01/22/2016 at am  . desvenlafaxine (PRISTIQ) 50 MG 24 hr tablet Take 25 mg by mouth every evening.    few days ago  . esomeprazole (NEXIUM) 40 MG capsule Take 40 mg by mouth daily as needed (acid reflux/ heartburn).   unknown  . ferrous sulfate (KP FERROUS SULFATE) 325 (65 FE) MG tablet Take 325 mg by mouth daily with breakfast.    few days ago  . isosorbide mononitrate (IMDUR) 30 MG 24  hr tablet Take 30 mg by mouth daily.   few days ago  . lisinopril (PRINIVIL,ZESTRIL) 10 MG tablet Take 1 tablet (10 mg total) by mouth daily. 90 tablet 1 few days ago  . nitroGLYCERIN (NITROSTAT) 0.4 MG SL tablet Place 1 tablet (0.4 mg total) under the tongue every 5 (five) minutes x 3 doses as needed for chest pain. 25 tablet 4 never used  . Omega-3 Fatty Acids (FISH OIL PO) Take 1 capsule by mouth daily.   few days ago  . rosuvastatin (CRESTOR) 10 MG tablet Take 5 mg by mouth every evening.    few nights ago  . vitamin B-12 (CYANOCOBALAMIN) 1000 MCG tablet Take 1,000 mcg by mouth every evening.    few nights ago    Scheduled: . acetaminophen  1,000 mg Oral TID  . aspirin EC  81 mg Oral Daily  . isosorbide mononitrate  30 mg Oral Daily  . lisinopril  20 mg Oral Daily  . rosuvastatin  5 mg Oral QPM  . venlafaxine XR  75 mg Oral Q breakfast     ROS:                                                                                                                                       History obtained from Daughter  General ROS: negative for - chills, fatigue, fever, night sweats, weight gain or weight loss Psychological ROS: negative for - behavioral disorder, hallucinations, memory difficulties, mood swings or suicidal ideation Ophthalmic ROS: negative for - blurry vision, double vision, eye pain or loss of vision ENT ROS: negative for - epistaxis, nasal discharge, oral lesions, sore throat, tinnitus or vertigo Allergy and Immunology ROS: negative for - hives or itchy/watery eyes Hematological and Lymphatic ROS: negative for - bleeding problems, bruising or swollen lymph nodes Endocrine ROS: negative for - galactorrhea, hair pattern changes, polydipsia/polyuria or temperature intolerance Respiratory ROS: negative for - cough, hemoptysis, shortness of breath or wheezing Cardiovascular ROS: negative for - chest pain, dyspnea on exertion, edema or irregular heartbeat Gastrointestinal ROS: negative for - abdominal pain, diarrhea, hematemesis, nausea/vomiting or stool incontinence Genito-Urinary ROS: negative for - dysuria, hematuria, incontinence or urinary frequency/urgency Musculoskeletal ROS: negative for - joint swelling or muscular weakness Neurological ROS: as noted in HPI Dermatological ROS: negative for rash and skin lesion changes   Blood pressure (!) 154/69, pulse 74, temperature 97.6 F (36.4 C), temperature source Oral, resp. rate 16, height '5\' 6"'$  (1.676 m), weight 69.5 kg (153 lb 3.5 oz), SpO2 97 %.   Neurologic Examination:  HEENT-  Normocephalic, no lesions, without obvious abnormality.  Normal external eye and conjunctiva.  Normal TM's bilaterally.  Normal auditory canals and external ears. Normal external nose, mucus membranes and septum.  Normal pharynx. Cardiovascular- S1, S2 normal, pulses palpable throughout   Lungs- chest clear, no wheezing, rales, normal symmetric air entry Abdomen- normal findings: bowel sounds normal Extremities- no edema Lymph-no adenopathy palpable Musculoskeletal-no joint tenderness, deformity or swelling Skin-warm and dry, no hyperpigmentation, vitiligo, or suspicious lesions  Neurological Examination Mental Status: Alert, not oriented to place, person, date. Speech is fluent without dysarthria or aphasia. Only able to follow simple commands. Cranial Nerves: II: Visual fields grossly normal, pupils equal, round, reactive to light and accommodation III,IV, VI: ptosis not present, extra-ocular motions intact bilaterally V,VII: smile symmetric, facial light touch sensation normal bilaterally VIII: hearing normal bilaterally IX,X: uvula rises symmetrically XI: bilateral shoulder shrug XII: midline tongue extension Motor: Right : Upper extremity   5/5    Left:     Upper extremity   5/5  Lower extremity   5/5     Lower extremity   5/5 Tone and bulk:normal tone throughout; no atrophy noted Sensory: Pinprick and light touch intact throughout, bilaterally Deep Tendon Reflexes: 1+ and symmetric throughout Plantars: Right: downgoing   Left: downgoing Cerebellar: normal finger-to-nose,and normal heel-to-shin test Gait: Not tested      Lab Results: Basic Metabolic Panel:  Recent Labs Lab 01/22/16 1535 01/23/16 0514 01/24/16 0434  NA 138 141 140  K 4.5 4.6 4.0  CL 106 110 105  CO2 '26 24 22  '$ GLUCOSE 88 98 99  BUN 29* 23* 25*  CREATININE 1.86* 1.63* 1.69*  CALCIUM 9.5 9.2 8.8*    Liver Function Tests:  Recent Labs Lab  01/22/16 1535  AST 17  ALT 18  ALKPHOS 51  BILITOT 0.7  PROT 6.5  ALBUMIN 4.0   No results for input(s): LIPASE, AMYLASE in the last 168 hours.  Recent Labs Lab 01/23/16 1033  AMMONIA 22    CBC:  Recent Labs Lab 01/22/16 1535 01/23/16 0514 01/24/16 0434  WBC 8.4 7.7 7.9  HGB 14.3 13.5 12.9*  HCT 43.6 42.0 40.2  MCV 92.0 91.3 91.6  PLT 154 133* 139*    Cardiac Enzymes:  Recent Labs Lab 01/22/16 2343 01/23/16 0514 01/23/16 1033 01/23/16 1645  TROPONINI 0.26* 0.33* 0.24* 0.14*    Lipid Panel: No results for input(s): CHOL, TRIG, HDL, CHOLHDL, VLDL, LDLCALC in the last 168 hours.  CBG:  Recent Labs Lab 01/22/16 2440  NUUVOZ 36    Microbiology: Results for orders placed or performed during the hospital encounter of 01/22/16  Urine culture     Status: None   Collection Time: 01/22/16  7:00 PM  Result Value Ref Range Status   Specimen Description URINE, RANDOM  Final   Special Requests NONE  Final   Culture NO GROWTH  Final   Report Status 01/23/2016 FINAL  Final    Coagulation Studies: No results for input(s): LABPROT, INR in the last 72 hours.  Imaging: Dg Chest 2 View  Result Date: 01/22/2016 CLINICAL DATA:  Altered mental status today. Chest pain and exertional shortness of breath. EXAM: CHEST  2 VIEW COMPARISON:  PA and lateral chest 10/13/2014 and single view of the chest 09/07/2013. FINDINGS: A large hiatal hernia is again seen. The patient is status post CABG. Left basilar atelectasis is noted. No pneumothorax or pleural effusion. No focal bony abnormality. IMPRESSION: No acute disease. Large hiatal hernia. Electronically  Signed   By: Inge Rise M.D.   On: 01/22/2016 16:44   Ct Head Wo Contrast  Result Date: 01/22/2016 CLINICAL DATA:  Patient was at baseline yesterday and is unable walk today. Recent UTI. EXAM: CT HEAD WITHOUT CONTRAST TECHNIQUE: Contiguous axial images were obtained from the base of the skull through the vertex without  intravenous contrast. COMPARISON:  July 11, 2012 FINDINGS: Brain: No subdural, epidural, or subarachnoid hemorrhage. Ventricles and sulci R prominent but essentially stable in the interval. The cerebellum is normal. Mild decreased attenuation in the right side of the pons on axial image and may be artifactual versus an age-indeterminate lacunar infarct. Remainder of the brainstem is normal. The basal cisterns are widely patent. Scattered white matter changes are identified. While they are similar in the interval, there has been some mild interval worsening. There is a lacunar infarct in the right thalamus not seen previously, favored to be nonacute but not specific. Low attenuation projected over the left external capsule on axial image 19 is noted to be volume averaging off the CSF in the adjacent fissure based on coronal images. No other evidence of acute infarct or ischemia. No mass, mass effect, or midline shift. Vascular: There are calcifications within the intracranial portions of the carotid arteries. The vessel are otherwise normal in appearance. Skull: Normal. Sinuses/Orbits: Debris is again seen in the sphenoid sinus with mucosal thickening in the ethmoid, frontal, and sphenoid sinuses. The mastoid air cells and middle ears are well aerated. Other: No other acute abnormalities. IMPRESSION: 1. There is a lacunar infarct in the right thalamus not seen in 2014, age indeterminate but favored to be nonacute. Low-attenuation in the right side of the pons could be artifactual versus an age indeterminate lacunar infarct. Recommend clinical correlation. An MRI could better evaluate the brain for subtle ischemia/infarct if clinical concern persists. 2. Sinus disease as above. Electronically Signed   By: Dorise Bullion III M.D   On: 01/22/2016 17:54   Mr Brain Wo Contrast  Result Date: 01/23/2016 CLINICAL DATA:  Altered mental status. Forgetfulness. Disorientation. EXAM: MRI HEAD WITHOUT CONTRAST TECHNIQUE:  Multiplanar, multiecho pulse sequences of the brain and surrounding structures were obtained without intravenous contrast. COMPARISON:  Head CT 01/22/2016 and 07/11/2012. FINDINGS: The study suffers from considerable motion degradation. Brain: Diffusion imaging does not show any acute or subacute infarction. There chronic small-vessel ischemic changes affecting the pons. No cerebellar insult. Cerebral hemispheres show chronic small-vessel ischemic changes affecting the thalami in the cerebral hemispheric deep and subcortical white matter. No large vessel territory infarction. No mass lesion, hemorrhage, hydrocephalus or extra-axial collection. No pituitary mass. Vascular: Major vessels show flow. Skull and upper cervical spine: Negative Sinuses/Orbits: Mucosal inflammatory changes affecting the paranasal sinuses, most pronounced in the sphenoid sinus. Other: None IMPRESSION: Motion degraded exam. No acute or reversible intracranial finding. Atrophy and chronic small vessel ischemic changes throughout the brain. Mucosal inflammation of the paranasal sinuses. Electronically Signed   By: Nelson Chimes M.D.   On: 01/23/2016 14:07   Ct Abdomen Pelvis W Contrast  Result Date: 01/22/2016 CLINICAL DATA:  Severe abdominal pain EXAM: CT ABDOMEN AND PELVIS WITH CONTRAST TECHNIQUE: Multidetector CT imaging of the abdomen and pelvis was performed using the standard protocol following bolus administration of intravenous contrast. CONTRAST:  1 ISOVUE-300 IOPAMIDOL (ISOVUE-300) INJECTION 61% COMPARISON:  CT scan January 16, 2016 FINDINGS: There is a large hiatal hernia with most of the stomach above the diaphragm. The lung bases are otherwise unchanged. No free  air or free fluid. There is a right inguinal hernia containing distal small bowel without obstruction. The patient is status post cholecystectomy. The liver and portal vein are otherwise normal. Granulomata are seen in the otherwise stable spleen. Low-attenuation the spleen  is stable as well of doubtful significance. The pancreas and adrenal glands are normal. The left kidney is atrophic compared to the right with no hydronephrosis or perinephric stranding. No ureteral stones. The right kidney also demonstrates no stones or masses. No hydronephrosis. No right ureteral stones. Atherosclerotic changes seen in the non aneurysmal aorta. No adenopathy. The small bowel is normal. The colon demonstrates diverticulosis without diverticulitis. The appendix is normal. No adenopathy in the pelvis. There is a TURP defect in the prostate. The bladder is mildly distended but otherwise unremarkable. Severe degenerative changes in the spine. Delayed images demonstrate no filling defects in the bilateral renal collecting systems. IMPRESSION: 1. There is a large right inguinal hernia containing small bowel without obstruction. 2. There is a large hiatal hernia with most of the stomach above the diaphragm. Electronically Signed   By: Dorise Bullion III M.D   On: 01/22/2016 19:22       Assessment and plan per attending neurologist  Etta Quill PA-C [facility] [phone]  01/24/2016, 3:22 PM   Assessment/Plan: This is a 82 year old male with 3 years plus declining mental status however has not yet been fully diagnosed with dementia. Patient had recent urinary tract infection which was treated with Keflex and confusion seemed to be improving slightly however patient remains slightly confused waking in the middle and night at 2 AM and not going back to sleep at times. Patient was brought back to the hospital with increased confusion. Thus far metabolic panel has not shown etiology for confusion, patient has remained afebrile with no white blood cell count, UA has been negative, urine culture negative.  Although I do not suspect this is related to seizure we will obtain a EEG. I suspect this is likely a delirium in the setting of underlying neurocognitive decline.   Awaiting  EEG, if negative, most like a result of progressive decline along with a component of delirium from interruption of his night/day cycle, recent infection and medication.  Please call back with any questions

## 2016-01-24 NOTE — Plan of Care (Signed)
Problem: Safety: Goal: Ability to remain free from injury will improve Outcome: Not Progressing RN instructed patient to call and wait for staff assistance prior to getting out of bed but patient confused and forgetful.  In the beginning of this shift patient's wife at bedside and also reinforced use of call light; showing patient which button to press for assistance.  Shortly after patient's wife educated patient on use of call light, patient's wife asked patient which button he should press for help.  Patient unable to answer.  Staff have been alerted to patient getting up unassisted multiple times thus far this shift due to sounding bed alarm.

## 2016-01-24 NOTE — Evaluation (Signed)
Physical Therapy Evaluation Patient Details Name: Scott [name] MRN: [id number] DOB: 08/17/1923 Today's Date: 01/24/2016   History of Present Illness  82 year old male with a history of coronary artery disease, CKD stage III, hypertension, hyperlipidemia, depression presented with acute mental status change for 2 days. On 01/16/2016, the patient went to the emergency department for complaints of bilateral leg pain. At that time, the patient was diagnosed with a "UTI" when he was noted to have hematuria. The patient was discharged from the emergency department with cephalexin and tramadol. Apparently, the patient's pain remains uncontrolled, and his wife increased his tramadol from 25 mg to 50 mg every 6 hours.. Around lunchtime on 01/21/2016, the patient was noted to be confused  Clinical Impression  Pt admitted with above diagnosis. Pt currently with functional limitations due to the deficits listed below (see PT Problem List). Pt mentation improved after ambulating though he still could not state where he was today. Ambulated with cane and min HHA. He continues to have LBP and fear that he could have same problems at home without LBP being addressed. Recommend HHPT to further evaluate this.  Pt will benefit from skilled PT to increase their independence and safety with mobility to allow discharge to the venue listed below.       Follow Up Recommendations Home health PT    Equipment Recommendations  None recommended by PT    Recommendations for Other Services       Precautions / Restrictions Precautions Precautions: Fall Restrictions Weight Bearing Restrictions: No      Mobility  Bed Mobility Overal bed mobility: Needs Assistance Bed Mobility: Sit to Supine       Sit to supine: Min assist   General bed mobility comments: pt received in bathroom on toilet. He needed min A to LE's for return to bed but once in bed, could scoot laterally to position self  Transfers Overall  transfer level: Needs assistance Equipment used: Rolling walker (2 wheeled) Transfers: Sit to/from Stand Sit to Stand: Min assist         General transfer comment: min A to stabilize RW as pt stood from toilet  Ambulation/Gait Ambulation/Gait assistance: Min assist Ambulation Distance (Feet): 100 Feet Assistive device: Straight cane;1 person hand held assist Gait Pattern/deviations: Step-through pattern;Staggering right;Staggering left Gait velocity: decreased   General Gait Details: pt began ambulation with RW, however, he was not familiar with use if RW and affected gait pattern negatively. Given his cane from home and then was safe with ambulation with it on right and min HHA on left. Occasional increased sway and min A to correct to prevent full LOB. Pt fatigued after 100' and ready to return to supine. Reported back pain after this  Stairs            Wheelchair Mobility    Modified Rankin (Stroke Patients Only)       Balance Overall balance assessment: Needs assistance Sitting-balance support: No upper extremity supported Sitting balance-Leahy Scale: Good     Standing balance support: Single extremity supported Standing balance-Leahy Scale: Fair                               Pertinent Vitals/Pain Pain Assessment: Faces Faces Pain Scale: Hurts little more Pain Location: back pain Pain Descriptors / Indicators: Aching Pain Intervention(s): Limited activity within patient's tolerance;Monitored during session    Home Living Family/patient expects to be discharged to:: Private residence Living Arrangements:  Spouse/significant other Available Help at Discharge: Family;Available 24 hours/day Type of Home: Independent living facility Home Access: Level entry     Home Layout: One level Home Equipment: Cane - single point Additional Comments: pt and wife live at [facility]. Wife reports that pt often forgetful but not more than what she  would expect from a 82 yo man she states. This is a big change for him    Prior Function Level of Independence: Independent with assistive device(s)         Comments: ambulates with SPC     Hand Dominance        Extremity/Trunk Assessment   Upper Extremity Assessment: Generalized weakness           Lower Extremity Assessment: Generalized weakness      Cervical / Trunk Assessment: Kyphotic  Communication   Communication: No difficulties  Cognition Arousal/Alertness: Awake/alert Behavior During Therapy: WFL for tasks assessed/performed Overall Cognitive Status: Impaired/Different from baseline Area of Impairment: Memory     Memory: Decreased short-term memory         General Comments: pt unable to tell me where he is and not very communicative beginning of session. However, after ambulation, he was more alert and appropriate, able to talk about [place] and what he has been watching on TV recently    General Comments      Exercises        Assessment/Plan    PT Assessment Patient needs continued PT services  PT Diagnosis Abnormality of gait;Generalized weakness;Acute pain   PT Problem List Decreased strength;Decreased activity tolerance;Decreased balance;Decreased mobility;Decreased cognition;Decreased knowledge of use of DME;Decreased knowledge of precautions;Pain  PT Treatment Interventions DME instruction;Gait training;Functional mobility training;Therapeutic activities;Therapeutic exercise;Balance training;Cognitive remediation;Patient/family education   PT Goals (Current goals can be found in the Care Plan section) Acute Rehab PT Goals Patient Stated Goal: pt did not state but wife wants pt to go home PT Goal Formulation: With patient/family Time For Goal Achievement: 02/07/16 Potential to Achieve Goals: Good    Frequency Min 3X/week   Barriers to discharge        Co-evaluation               End of Session Equipment Utilized During  Treatment: Gait belt Activity Tolerance: Patient tolerated treatment well Patient left: in bed;with call bell/phone within reach;with bed alarm set;with family/visitor present Nurse Communication: Mobility status    Functional Assessment Tool Used: clinical judgement Functional Limitation: Mobility: Walking and moving around Mobility: Walking and Moving Around Current Status [phone]): At least 1 percent but less than 20 percent impaired, limited or restricted Mobility: Walking and Moving Around Goal Status [phone]): 0 percent impaired, limited or restricted    Time: 1152-1212 PT Time Calculation (min) (ACUTE ONLY): 20 min   Charges:   PT Evaluation $PT Eval Moderate Complexity: 1 Procedure     PT G Codes:   PT G-Codes **NOT FOR INPATIENT CLASS** Functional Assessment Tool Used: clinical judgement Functional Limitation: Mobility: Walking and moving around Mobility: Walking and Moving Around Current Status ([id number]): At least 1 percent but less than 20 percent impaired, limited or restricted Mobility: Walking and Moving Around Goal Status [phone]): 0 percent impaired, limited or restricted  Leighton Roach, [place], [place] 01/24/2016, 1:58 PM

## 2016-01-25 MED ORDER — LISINOPRIL 10 MG PO TABS: 20.0000 mg | ORAL_TABLET | Freq: Every day | ORAL | 0 refills | Status: DC

## 2016-01-25 NOTE — Care Management Note (Signed)
Case Management Note  Patient Details  Name: Scott [name] MRN: [id number] Date of Birth: June 12, 1923  Subjective/Objective:   CM following for progression and d/c planning.                  Action/Plan: 01/25/2016 Met with pt wife on 01/23/2016, pt and wife are residents of [facility], plan is to return to that facility. Pt will need HHPT, this CM contacted [facility] [employer], social worker Azucena Kuba advised this CM to fax orders for HHPT to their physical therapy dept @ [phone]. This was faxed and a copy of the order and face to face was given to the wife in case the nurse at [facility]   Expected Discharge Date:     01/25/2016             Expected Discharge Plan:  [place]  In-House Referral:  NA  Discharge planning Services  CM Consult  Post Acute Care Choice:  Home Health Choice offered to:  Spouse  DME Arranged:  N/A DME Agency:   NA  HH Arranged:  PT HH Agency:   (PT to be provided by [facility] Physical Therapy Dept. )  Status of Service:  Completed, signed off  If discussed at [place] of Stay Meetings, dates discussed:    Additional Comments:  Adron Bene, RN 01/25/2016, 3:29 PM

## 2016-01-25 NOTE — Evaluation (Signed)
Occupational Therapy Evaluation Patient Details Name: Scott [name] MRN: [id number] DOB: [birth year] Today's Date: 01/25/2016    History of Present Illness 82 year old male with a history of coronary artery disease, CKD stage III, hypertension, hyperlipidemia, depression presented with acute mental status change for 2 days. On 01/16/2016, the patient went to the emergency department for complaints of bilateral leg pain. At that time, the patient was diagnosed with a "UTI" when he was noted to have hematuria. The patient was discharged from the emergency department with cephalexin and tramadol. Apparently, the patient's pain remains uncontrolled, and his wife increased his tramadol from 25 mg to 50 mg every 6 hours.. Around lunchtime on 01/21/2016, the patient was noted to be confused   Clinical Impression   Pt is at sup - min guard A level with ADLs and ADL mobility with some cognitive deficits, decreased fucntionl balance decreased safety awareness. No further acute OT is indicated at this time and pt to return to [facility] with [facility] with [facility].     Follow Up Recommendations  No OT follow up;Supervision/Assistance - 24 hour    Equipment Recommendations  None recommended by OT    Recommendations for Other Services       Precautions / Restrictions Precautions Precautions: Fall Restrictions Weight Bearing Restrictions: No      Mobility Bed Mobility Overal bed mobility: Needs Assistance         Sit to supine: Supervision      Transfers Overall transfer level: Needs assistance Equipment used: Rolling walker (2 wheeled);1 person hand held assist Transfers: Sit to/from Stand Sit to Stand: Min guard              Balance     Sitting balance-Leahy Scale: Good       Standing balance-Leahy Scale: Fair                              ADL Overall ADL's : Needs assistance/impaired     Grooming: Wash/dry face;Wash/dry hands;Standing;Min guard    Upper Body Bathing: Set up;Sitting   Lower Body Bathing: Sit to/from stand;Sitting/lateral leans;Min guard;Supervison/ safety   Upper Body Dressing : Sitting;Supervision/safety   Lower Body Dressing: Supervision/safety;Min guard;Sit to/from stand;Sitting/lateral leans   Toilet Transfer: Min guard;Comfort height toilet;Grab bars   Toileting- Water quality scientist and Hygiene: Min guard;Sit to/from stand   Tub/ Banker: Min guard;3 in 1;Grab bars   Functional mobility during ADLs: Min guard;Supervision/safety       Vision  no change from baseline              Pertinent Vitals/Pain Pain Assessment: No/denies pain     Hand Dominance Right   Extremity/Trunk Assessment Upper Extremity Assessment Upper Extremity Assessment: Generalized weakness   Lower Extremity Assessment Lower Extremity Assessment: Defer to PT evaluation   Cervical / Trunk Assessment Cervical / Trunk Assessment: Kyphotic   Communication Communication Communication: No difficulties   Cognition Arousal/Alertness: Awake/alert Behavior During Therapy: WFL for tasks assessed/performed Overall Cognitive Status: Impaired/Different from baseline Area of Impairment: Memory;Safety/judgement     Memory: Decreased short-term memory   Safety/Judgement: Decreased awareness of safety         General Comments   pt very pleasant and cooperative, wife supportive                 Home Living Family/patient expects to be discharged to:: Private residence Living Arrangements: Spouse/significant other Available Help at Discharge: Family;Available  24 hours/day Type of Home: Independent living facility Home Access: Level entry     Home Layout: One level     Bathroom Shower/Tub: Occupational psychologist: Handicapped height     Home Equipment: [place] - single point;Shower seat   Additional Comments: pt and wife live at [facility]. Wife reports that pt often forgetful but  not more than what she would expect from a 82 yo man she states. This is a big change for him      Prior Functioning/Environment Level of Independence: Independent with assistive device(s)        Comments: ambulates with SPC    OT Diagnosis: Generalized weakness;Cognitive deficits   OT Problem List: Impaired balance (sitting and/or standing);Decreased cognition;Decreased safety awareness;Decreased knowledge of use of DME or AE;Decreased activity tolerance   OT Treatment/Interventions:      OT Goals(Current goals can be found in the care plan section) Acute Rehab OT Goals Patient Stated Goal: go home  OT Frequency:     Barriers to D/C:                          End of Session Equipment Utilized During Treatment: Gait belt  Activity Tolerance: Patient tolerated treatment well Patient left: in bed;with nursing/sitter in room;with family/visitor present (sitting EOB)   Time: 1427-6701 OT Time Calculation (min): 28 min Charges:  OT General Charges $OT Visit: 1 Procedure OT Evaluation $OT Eval Moderate Complexity: 1 Procedure OT Treatments $Therapeutic Activity: 8-22 mins G-Codes:    Britt Bottom 01/25/2016, 1:26 PM

## 2016-01-25 NOTE — Discharge Instructions (Signed)
Confusion Confusion is the inability to think with your usual speed or clarity. Confusion may come on quickly or slowly over time. How quickly the confusion comes on depends on the cause. Confusion can be due to any number of causes. CAUSES   Concussion, head injury, or head trauma.  Seizures.  Stroke.  Fever.  Brain tumor.  Age related decreased brain function (dementia).  Heightened emotional states like rage or terror.  Mental illness in which the person loses the ability to determine what is real and what is not (hallucinations).  Infections such as a urinary tract infection (UTI).  Toxic effects from alcohol, drugs, or prescription medicines.  Dehydration and an imbalance of salts in the body (electrolytes).  Lack of sleep.  Low blood sugar (diabetes).  Low levels of oxygen from conditions such as chronic lung disorders.  Drug interactions or other medicine side effects.  Nutritional deficiencies, especially niacin, thiamine, vitamin C, or vitamin B.  Sudden drop in body temperature (hypothermia).  Change in routine, such as when traveling or hospitalized. SIGNS AND SYMPTOMS  People often describe their thinking as cloudy or unclear when they are confused. Confusion can also include feeling disoriented. That means you are unaware of where or who you are. You may also not know what the date or time is. If confused, you may also have difficulty paying attention, remembering, and making decisions. Some people also act aggressively when they are confused.  DIAGNOSIS  The medical evaluation of confusion may include:  Blood and urine tests.  X-rays.  Brain and nervous system tests.  Analyzing your brain waves (electroencephalogram or EEG).  Magnetic resonance imaging (MRI) of your head.  Computed tomography (CT) scan of your head.  Mental status tests in which your health care provider may ask many questions. Some of these questions may seem silly or strange,  but they are a very important test to help diagnose and treat confusion. TREATMENT  An admission to the hospital may not be needed, but a person with confusion should not be left alone. Stay with a family member or friend until the confusion clears. Avoid alcohol, pain relievers, or sedative drugs until you have fully recovered. Do not drive until directed by your health care provider. HOME CARE INSTRUCTIONS  What family and friends can do:  To find out if someone is confused, ask the person to state his or her name, age, and the date. If the person is unsure or answers incorrectly, he or she is confused.  Always introduce yourself, no matter how well the person knows you.  Often remind the person of his or her location.  Place a calendar and clock near the confused person.  Help the person with his or her medicines. You may want to use a pill box, an alarm as a reminder, or give the person each dose as prescribed.  Talk about current events and plans for the day.  Try to keep the environment calm, quiet, and peaceful.  Make sure the person keeps follow-up visits with his or her health care provider. PREVENTION  Ways to prevent confusion:  Avoid alcohol.  Eat a balanced diet.  Get enough sleep.  Take medicine only as directed by your health care provider.  Do not become isolated. Spend time with other people and make plans for your days.  Keep careful watch on your blood sugar levels if you are diabetic. SEEK IMMEDIATE MEDICAL CARE IF:   You develop severe headaches, repeated vomiting, seizures, blackouts, or   slurred speech.  There is increasing confusion, weakness, numbness, restlessness, or personality changes.  You develop a loss of balance, have marked dizziness, feel uncoordinated, or fall.  You have delusions, hallucinations, or develop severe anxiety.  Your family members think you need to be rechecked.   This information is not intended to replace advice given  to you by your health care provider. Make sure you discuss any questions you have with your health care provider.   Document Released: 06/13/2004 Document Revised: 05/27/2014 Document Reviewed: 06/11/2013 Elsevier Interactive Patient Education 2016 [employer]  

## 2016-01-25 NOTE — Discharge Summary (Addendum)
Physician Discharge Summary  Scott [name] QPY:[id number] DOB: Jun 28, 1923  PCP: Henrine Screws, MD  Admit date: 01/22/2016 Discharge date: 01/25/2016  Admitted From: Home Disposition:  Home  Recommendations for Outpatient Follow-up:  1. Dr. Henrine Screws, PCP: Patient advised to keep prior appointment on 01/30/16. To be seen with repeat labs (CBC & BMP).  Home Health: PT Equipment/Devices: None    Discharge Condition: Improved and stable  CODE STATUS: DO NOT RESUSCITATE  Diet recommendation: Heart healthy diet  Discharge Diagnoses:  Principal Problem:   Delirium Active Problems:   Essential hypertension   Coronary artery disease due to lipid rich plaque   CKD (chronic kidney disease), stage IV (HCC)   CAD (coronary artery disease) of artery bypass graft, ALL VGs occluded, patent LIMA -->LAD, patent dominant RCA   Acute encephalopathy   Altered mental status   Brief/Interim Summary: 82 year old male with a history of coronary artery disease, CKD stage III, hypertension, hyperlipidemia, depression presented with acute mental status change for 2 days. On 01/16/2016, the patient went to the emergency department for complaints of bilateral leg pain. At that time, the patient was diagnosed with a "UTI" when he was noted to have hematuria. The patient was discharged from the emergency department with cephalexin and tramadol. Apparently, the patient's pain remained uncontrolled, and his wife increased his tramadol from 25 mg to 50 mg every 6 hours.. Around lunchtime on 01/21/2016, the patient was noted to be confused staring off at the carpet for long periods of time. Because of continued worsening of his mental status, the patient was brought to the emergency department. BMP revealed a serum creatinine 1.86 with essentially unremarkable CBC except for his chronic thrombocytopenia. CT of the brain in the emergency department revealed a possible lacunar infarct in the right thalamus  favoring nonacute timeframe. There was also a low attenuation lesion in the right side of the pons. CT of abdomen and pelvis showed a large hiatus hernia and right inguinal hernia without obstruction. Patient had some confusion even prior to ED visit. Since admission, workup has been unrevealing otherwise. Remained confused on 9/6. Neurology consulted.  Assessment/Plan: Acute encephalopathy/delirium -Likely multifactorial including tramadol, pain (now resolved), disrupted night/day cycle complicating underlying dementia. -Examination showed that the patient has some myoclonus/asterixis -EEG 01/23/16: Abnormal due to moderate diffuse slowing of the waking background. Nonspecific in etiology. No epileptiform discharges noted. -MRI brain: No acute or reversible intracranial findings. -01/22/2016 CT brain possible lacunar infarct, right thalamus -tramadol may decrease seizure threshold resulting in his staring episodes. Tramadol discontinued -TSH 2.186 -Ammonia: 22, B12: 869 and urine culture negative. - Patient had persistent confusion on 8/6 and neurology was consulted. They suspect patient has progressive neurocognitive decline along with a competent to clear him from interruption of his night/day cycle, recent infection and medications. - Patient has significantly improved today. Although patient is alert and oriented to self and partly to place, he appears slightly confused and as per his spouse at bedside, she states that she has been talking to him this morning and indicates that he is almost close to his baseline. She has been counseled extensively to use acetaminophen for pain and avoid any stronger pain medications i.e. tramadol. She verbalizes understanding.  Elevated troponin with history of CAD and CABG -Difficult to ascertain clinically whether the patient is having chest pain secondary to his encephalopathy -10/14/2015 heart catheterization--100% occluded grafts, 40% mid RCA -Check  echocardiogram: Results as below and unremarkable.  -Continue Imdur -med record shows intolerance to  BB-->bradycardia - Cardiology consultation 9/5 appreciated: Troponin elevation likely represents demand ischemia and would not pursue further ischemic evaluation. Heparin was discontinued. Continue aspirin, statins, nitrates and ACEI (dose was increased) . No beta blockers secondary to history of bradycardia on beta blockers.  Pyuria -01/16/2016 UA 6-30 WBCs -Culture was negative -Repeat UA 01/22/2016 negative pyuria. -Discontinued antibiotics and observe clinically  Cognitive impairment/dementia -Advised based, the patient's wife states that he has a degree of impairment -Does not have formal diagnosis of dementia, but spouse acknowledges he needs formal eval - Probably has moderate degree of dementia which can be further evaluated as outpatient.   CKD stage III-IV -Baseline creatinine 1.6-1.9 -He is at baseline hypertension -Monitor creatinine on lisinopril, dose was increased during this admission . Creatinine stable in the 1.6 range. follow BMP as outpatient.   Hypertension  -Continue Imdur and lisinopril, dose increased. Blood pressure control improved. Outpatient follow-up and may consider adding amlodipine if remains consistently greater than 150/90.  Hyperlipidemia -Continue statin  Depression - Continue Pristiq.  Leg pain -??difficult to ascertain presently. Apparently has been a chronic problem followed by his PCP. -exam is benign presently - Has not complained of pain over the last 2 days.   Thrombocytopenia - Stable.  Large right inguinal hernia with unobstructed bowel - Seen on CT   Large hiatal hernia - With most of the stomach above the diaphragm. Seems asymptomatic.    Discharge Instructions  Discharge Instructions    Call MD for:    Complete by:  As directed   Worsening altered mental status or confusion.   Call MD for:  severe uncontrolled  pain    Complete by:  As directed   Diet - low sodium heart healthy    Complete by:  As directed   Increase activity slowly    Complete by:  As directed       Medication List    STOP taking these medications   cephALEXin 500 MG capsule Commonly known as:  KEFLEX     TAKE these medications   acetaminophen 325 MG tablet Commonly known as:  TYLENOL Take 2 tablets (650 mg total) by mouth every 4 (four) hours as needed for headache or mild pain.   aspirin EC 81 MG tablet Take 81 mg by mouth daily.   esomeprazole 40 MG capsule Commonly known as:  NEXIUM Take 40 mg by mouth daily as needed (acid reflux/ heartburn).   FISH OIL PO Take 1 capsule by mouth daily.   isosorbide mononitrate 30 MG 24 hr tablet Commonly known as:  IMDUR Take 30 mg by mouth daily.   KP FERROUS SULFATE 325 (65 FE) MG tablet Generic drug:  ferrous sulfate Take 325 mg by mouth daily with breakfast.   lisinopril 10 MG tablet Commonly known as:  PRINIVIL,ZESTRIL Take 2 tablets (20 mg total) by mouth daily. What changed:  how much to take   nitroGLYCERIN 0.4 MG SL tablet Commonly known as:  NITROSTAT Place 1 tablet (0.4 mg total) under the tongue every 5 (five) minutes x 3 doses as needed for chest pain.   PRISTIQ 50 MG 24 hr tablet Generic drug:  desvenlafaxine Take 25 mg by mouth every evening.   PROAIR HFA 108 (90 Base) MCG/ACT inhaler Generic drug:  albuterol Inhale 2 puffs into the lungs every 6 (six) hours as needed for wheezing or shortness of breath.   rosuvastatin 10 MG tablet Commonly known as:  CRESTOR Take 5 mg by mouth every evening.  vitamin B-12 1000 MCG tablet Commonly known as:  CYANOCOBALAMIN Take 1,000 mcg by mouth every evening.      Follow-up Information    GATES,ROBERT NEVILL, MD Follow up on 01/30/2016.   Specialty:  Internal Medicine Why:  Spouse states that patient has prior appointment on 01/30/16 and is advised to keep that appointment. To be seen with repeat labs  (CBC & BMP). Contact information: [address] [place] [address] [phone]          No Known Allergies  Consultations:  Neurology   Cardiology   Procedures/Studies: Dg Chest 2 View  Result Date: 01/22/2016 CLINICAL DATA:  Altered mental status today. Chest pain and exertional shortness of breath. EXAM: CHEST  2 VIEW COMPARISON:  PA and lateral chest 10/13/2014 and single view of the chest 09/07/2013. FINDINGS: A large hiatal hernia is again seen. The patient is status post CABG. Left basilar atelectasis is noted. No pneumothorax or pleural effusion. No focal bony abnormality. IMPRESSION: No acute disease. Large hiatal hernia. Electronically Signed   By: Inge Rise M.D.   On: 01/22/2016 16:44   Ct Head Wo Contrast  Result Date: 01/22/2016 CLINICAL DATA:  Patient was at baseline yesterday and is unable walk today. Recent UTI. EXAM: CT HEAD WITHOUT CONTRAST TECHNIQUE: Contiguous axial images were obtained from the base of the skull through the vertex without intravenous contrast. COMPARISON:  July 11, 2012 FINDINGS: Brain: No subdural, epidural, or subarachnoid hemorrhage. Ventricles and sulci R prominent but essentially stable in the interval. The cerebellum is normal. Mild decreased attenuation in the right side of the pons on axial image and may be artifactual versus an age-indeterminate lacunar infarct. Remainder of the brainstem is normal. The basal cisterns are widely patent. Scattered white matter changes are identified. While they are similar in the interval, there has been some mild interval worsening. There is a lacunar infarct in the right thalamus not seen previously, favored to be nonacute but not specific. Low attenuation projected over the left external capsule on axial image 19 is noted to be volume averaging off the CSF in the adjacent fissure based on coronal images. No other evidence of acute infarct or ischemia. No mass, mass effect, or  midline shift. Vascular: There are calcifications within the intracranial portions of the carotid arteries. The vessel are otherwise normal in appearance. Skull: Normal. Sinuses/Orbits: Debris is again seen in the sphenoid sinus with mucosal thickening in the ethmoid, frontal, and sphenoid sinuses. The mastoid air cells and middle ears are well aerated. Other: No other acute abnormalities. IMPRESSION: 1. There is a lacunar infarct in the right thalamus not seen in 2014, age indeterminate but favored to be nonacute. Low-attenuation in the right side of the pons could be artifactual versus an age indeterminate lacunar infarct. Recommend clinical correlation. An MRI could better evaluate the brain for subtle ischemia/infarct if clinical concern persists. 2. Sinus disease as above. Electronically Signed   By: Dorise Bullion III M.D   On: 01/22/2016 17:54   Mr Brain Wo Contrast  Result Date: 01/23/2016 CLINICAL DATA:  Altered mental status. Forgetfulness. Disorientation. EXAM: MRI HEAD WITHOUT CONTRAST TECHNIQUE: Multiplanar, multiecho pulse sequences of the brain and surrounding structures were obtained without intravenous contrast. COMPARISON:  Head CT 01/22/2016 and 07/11/2012. FINDINGS: The study suffers from considerable motion degradation. Brain: Diffusion imaging does not show any acute or subacute infarction. There chronic small-vessel ischemic changes affecting the pons. No cerebellar insult. Cerebral hemispheres show chronic small-vessel ischemic changes  affecting the thalami in the cerebral hemispheric deep and subcortical white matter. No large vessel territory infarction. No mass lesion, hemorrhage, hydrocephalus or extra-axial collection. No pituitary mass. Vascular: Major vessels show flow. Skull and upper cervical spine: Negative Sinuses/Orbits: Mucosal inflammatory changes affecting the paranasal sinuses, most pronounced in the sphenoid sinus. Other: None IMPRESSION: Motion degraded exam. No acute or  reversible intracranial finding. Atrophy and chronic small vessel ischemic changes throughout the brain. Mucosal inflammation of the paranasal sinuses. Electronically Signed   By: Nelson Chimes M.D.   On: 01/23/2016 14:07   Ct Abdomen Pelvis W Contrast  Result Date: 01/22/2016 CLINICAL DATA:  Severe abdominal pain EXAM: CT ABDOMEN AND PELVIS WITH CONTRAST TECHNIQUE: Multidetector CT imaging of the abdomen and pelvis was performed using the standard protocol following bolus administration of intravenous contrast. CONTRAST:  1 ISOVUE-300 IOPAMIDOL (ISOVUE-300) INJECTION 61% COMPARISON:  CT scan January 16, 2016 FINDINGS: There is a large hiatal hernia with most of the stomach above the diaphragm. The lung bases are otherwise unchanged. No free air or free fluid. There is a right inguinal hernia containing distal small bowel without obstruction. The patient is status post cholecystectomy. The liver and portal vein are otherwise normal. Granulomata are seen in the otherwise stable spleen. Low-attenuation the spleen is stable as well of doubtful significance. The pancreas and adrenal glands are normal. The left kidney is atrophic compared to the right with no hydronephrosis or perinephric stranding. No ureteral stones. The right kidney also demonstrates no stones or masses. No hydronephrosis. No right ureteral stones. Atherosclerotic changes seen in the non aneurysmal aorta. No adenopathy. The small bowel is normal. The colon demonstrates diverticulosis without diverticulitis. The appendix is normal. No adenopathy in the pelvis. There is a TURP defect in the prostate. The bladder is mildly distended but otherwise unremarkable. Severe degenerative changes in the spine. Delayed images demonstrate no filling defects in the bilateral renal collecting systems. IMPRESSION: 1. There is a large right inguinal hernia containing small bowel without obstruction. 2. There is a large hiatal hernia with most of the stomach above the  diaphragm. Electronically Signed   By: Dorise Bullion III M.D   On: 01/22/2016 19:22   Ct Renal Stone Study  Result Date: 01/16/2016 CLINICAL DATA:  Bilateral flank and leg pain EXAM: CT ABDOMEN AND PELVIS WITHOUT CONTRAST TECHNIQUE: Multidetector CT imaging of the abdomen and pelvis was performed following the standard protocol without IV contrast. COMPARISON:  None. FINDINGS: Lower chest:  Large hiatal hernia. Hepatobiliary: Cholecystectomy.  Normal liver and bile ducts. Pancreas: Normal Spleen: Scattered calcified granulomata.  Otherwise normal. Adrenals/Urinary Tract: The adrenals and kidneys are normal in appearance. There is no urinary calculus evident. There is no hydronephrosis or ureteral dilatation. Collecting systems and ureters appear unremarkable. The urinary bladder is remarkable only for mild bladder base impression from the prostate, as well as probable TURP defect. Stomach/Bowel: Large hiatal hernia with nearly all of the stomach in the chest. The distal antrum and pylorus are below the diaphragm. Small bowel is negative for intrinsic abnormality. No small bowel dilatation or obstruction. Extensive colonic diverticulosis. No evidence of acute diverticulitis or other acute inflammatory process. The appendix is normal. There is unobstructed small bowel within a right inguinal hernia. Vascular/Lymphatic: The abdominal aorta is normal in caliber. There is extensive atherosclerotic calcification. There is no adenopathy in the abdomen or pelvis. Reproductive: Unremarkable. Other: No bowel obstruction. No extraluminal air. No ascites. No focal inflammatory changes are evident. Musculoskeletal: No significant skeletal  lesion. Moderate lumbar degenerative disc and facet changes are present. IMPRESSION: 1. Large hiatal hernia.  Most of the stomach is in the chest 2. Right inguinal hernia containing unobstructed small bowel. 3. Diverticulosis. 4. Normal appendix. No acute inflammatory changes are evident  in the abdomen or pelvis. Electronically Signed   By: Andreas Newport M.D.   On: 01/16/2016 05:51   Dg Hips Bilat With Pelvis 3-4 Views  Result Date: 01/16/2016 CLINICAL DATA:  Bilateral hip pain EXAM: DG HIP (WITH OR WITHOUT PELVIS) 3-4V BILAT COMPARISON:  None. FINDINGS: No fracture or ducts location of either hip. There is mild bilateral osteoarthrosis of the hips. No pelvic fracture is visible. Visualized bowel gas pattern is normal. IMPRESSION: No acute fracture or dislocation of the hips. Mild bilateral hip osteoarthrosis. Electronically Signed   By: Ulyses Jarred M.D.   On: 01/16/2016 06:08  2-D echo 01/24/16: Study Conclusions  - Left ventricle: The cavity size was normal. There was moderate   focal basal hypertrophy. Systolic function was normal. The   estimated ejection fraction was in the range of 55% to 60%. Wall   motion was normal; there were no regional wall motion   abnormalities. There was an increased relative contribution of   atrial contraction to ventricular filling. Doppler parameters are   consistent with abnormal left ventricular relaxation (grade 1   diastolic dysfunction). - Aortic valve: Moderately calcified annulus. Trileaflet; mildly   thickened, mildly calcified leaflets. There was very mild   stenosis. There was mild regurgitation. Valve area (VTI): 1.84   cm^2. Valve area (Vmax): 1.48 cm^2. Valve area (Vmean): 1.71   cm^2.    Subjective:  seems slightly confused. Seen sitting up and eating breakfast by himself. Oriented to self and partly to place. Sitter at bedside. Discussed with spouse at bedside who indicates that he is much better today and that his mental status is almost close to baseline.   Discharge Exam:  Vitals:   01/24/16 1805 01/24/16 2048 01/25/16 0504 01/25/16 1017  BP: 129/63 137/66 (!) 162/74 (!) 163/75  Pulse: 78 81 70 70  Resp: '16 16 17 17  '$ Temp: 98.1 F (36.7 C) 98.3 F (36.8 C) 98.2 F (36.8 C) 97.4 F (36.3 C)  TempSrc: Oral  Oral Oral Oral  SpO2: 100% 95% 98% 97%  Weight:  72.1 kg (158 lb 15.2 oz)    Height:         General:  Elderly frail male patient sitting up comfortably in bed and eating breakfast by himself. Looks much improved compared to yesterday.   HEENT: No icterus, No thrush, No neck mass, Wirt/AT  Cardiovascular: RRR, S1/S2, no rubs, no gallops. Not on telemetry  Respiratory: CTA bilaterally, no wheezing, no crackles, no rhonchi  Abdomen: Soft/+BS, non tender, non distended, no guarding  Extremities: No edema, No lymphangitis, No petechiae, No rashes, no synovitis  CNS: Alert and oriented to self, partly to place. No focal neurological deficits. Neck supple.    The results of significant diagnostics from this hospitalization (including imaging, microbiology, ancillary and laboratory) are listed below for reference.     Microbiology: Recent Results (from the past 240 hour(s))  Urine culture     Status: None   Collection Time: 01/16/16  4:25 AM  Result Value Ref Range Status   Specimen Description URINE, RANDOM  Final   Special Requests ADDED [id number]  Final   Culture NO GROWTH  Final   Report Status 01/17/2016 FINAL  Final  Urine culture  Status: None   Collection Time: 01/22/16  7:00 PM  Result Value Ref Range Status   Specimen Description URINE, RANDOM  Final   Special Requests NONE  Final   Culture NO GROWTH  Final   Report Status 01/23/2016 FINAL  Final     Labs: BNP (last 3 results) No results for input(s): BNP in the last 8760 hours. Basic Metabolic Panel:  Recent Labs Lab 01/22/16 1535 01/23/16 0514 01/24/16 0434  NA 138 141 140  K 4.5 4.6 4.0  CL 106 110 105  CO2 '26 24 22  '$ GLUCOSE 88 98 99  BUN 29* 23* 25*  CREATININE 1.86* 1.63* 1.69*  CALCIUM 9.5 9.2 8.8*   Liver Function Tests:  Recent Labs Lab 01/22/16 1535  AST 17  ALT 18  ALKPHOS 51  BILITOT 0.7  PROT 6.5  ALBUMIN 4.0   No results for input(s): LIPASE, AMYLASE in the last 168  hours.  Recent Labs Lab 01/23/16 1033  AMMONIA 22   CBC:  Recent Labs Lab 01/22/16 1535 01/23/16 0514 01/24/16 0434  WBC 8.4 7.7 7.9  HGB 14.3 13.5 12.9*  HCT 43.6 42.0 40.2  MCV 92.0 91.3 91.6  PLT 154 133* 139*   Cardiac Enzymes:  Recent Labs Lab 01/22/16 2343 01/23/16 0514 01/23/16 1033 01/23/16 1645  TROPONINI 0.26* 0.33* 0.24* 0.14*   BNP: Invalid input(s): POCBNP CBG:  Recent Labs Lab 01/22/16 1552  GLUCAP 79   D-Dimer No results for input(s): DDIMER in the last 72 hours. Hgb A1c No results for input(s): HGBA1C in the last 72 hours. Lipid Profile No results for input(s): CHOL, HDL, LDLCALC, TRIG, CHOLHDL, LDLDIRECT in the last 72 hours. Thyroid function studies  Recent Labs  01/22/16 2343  TSH 2.186   Anemia work up  Recent Labs  01/23/16 1033  VITAMINB12 869   Urinalysis    Component Value Date/Time   COLORURINE YELLOW 01/22/2016 1900   APPEARANCEUR CLEAR 01/22/2016 1900   LABSPEC 1.016 01/22/2016 1900   PHURINE 6.0 01/22/2016 1900   GLUCOSEU NEGATIVE 01/22/2016 1900   HGBUR NEGATIVE 01/22/2016 1900   BILIRUBINUR NEGATIVE 01/22/2016 1900   KETONESUR NEGATIVE 01/22/2016 1900   PROTEINUR NEGATIVE 01/22/2016 1900   UROBILINOGEN 0.2 08/15/2012 1215   NITRITE NEGATIVE 01/22/2016 1900   [place] 01/22/2016 1900      Time coordinating discharge: Over 30 minutes  SIGNED:  Vernell Leep, MD, FACP, [place]. [facility] Pager [phone]  If 7PM-7AM, please contact night-coverage [email or web address] Password [email or web address] 01/25/2016, 2:51 PM

## 2016-01-29 DIAGNOSIS — E785 Hyperlipidemia, unspecified: Secondary | ICD-10-CM | POA: Diagnosis not present

## 2016-01-29 DIAGNOSIS — R2681 Unsteadiness on feet: Secondary | ICD-10-CM | POA: Diagnosis not present

## 2016-01-29 DIAGNOSIS — K219 Gastro-esophageal reflux disease without esophagitis: Secondary | ICD-10-CM | POA: Diagnosis not present

## 2016-01-29 DIAGNOSIS — F418 Other specified anxiety disorders: Secondary | ICD-10-CM | POA: Diagnosis not present

## 2016-01-29 DIAGNOSIS — M6281 Muscle weakness (generalized): Secondary | ICD-10-CM | POA: Diagnosis not present

## 2016-01-29 DIAGNOSIS — D509 Iron deficiency anemia, unspecified: Secondary | ICD-10-CM | POA: Diagnosis not present

## 2016-01-29 DIAGNOSIS — I2510 Atherosclerotic heart disease of native coronary artery without angina pectoris: Secondary | ICD-10-CM | POA: Diagnosis not present

## 2016-01-29 DIAGNOSIS — N189 Chronic kidney disease, unspecified: Secondary | ICD-10-CM | POA: Diagnosis not present

## 2016-01-29 DIAGNOSIS — I10 Essential (primary) hypertension: Secondary | ICD-10-CM | POA: Diagnosis not present

## 2016-01-30 DIAGNOSIS — E538 Deficiency of other specified B group vitamins: Secondary | ICD-10-CM | POA: Diagnosis not present

## 2016-01-30 DIAGNOSIS — I25111 Atherosclerotic heart disease of native coronary artery with angina pectoris with documented spasm: Secondary | ICD-10-CM | POA: Diagnosis not present

## 2016-01-30 DIAGNOSIS — G629 Polyneuropathy, unspecified: Secondary | ICD-10-CM | POA: Diagnosis not present

## 2016-01-30 DIAGNOSIS — I10 Essential (primary) hypertension: Secondary | ICD-10-CM | POA: Diagnosis not present

## 2016-01-30 DIAGNOSIS — K219 Gastro-esophageal reflux disease without esophagitis: Secondary | ICD-10-CM | POA: Diagnosis not present

## 2016-01-30 DIAGNOSIS — D81818 Other biotin-dependent carboxylase deficiency: Secondary | ICD-10-CM | POA: Diagnosis not present

## 2016-01-30 DIAGNOSIS — D509 Iron deficiency anemia, unspecified: Secondary | ICD-10-CM | POA: Diagnosis not present

## 2016-01-30 DIAGNOSIS — R4182 Altered mental status, unspecified: Secondary | ICD-10-CM | POA: Diagnosis not present

## 2016-01-30 DIAGNOSIS — F329 Major depressive disorder, single episode, unspecified: Secondary | ICD-10-CM | POA: Diagnosis not present

## 2016-01-30 DIAGNOSIS — I25119 Atherosclerotic heart disease of native coronary artery with unspecified angina pectoris: Secondary | ICD-10-CM | POA: Diagnosis not present

## 2016-01-31 DIAGNOSIS — I10 Essential (primary) hypertension: Secondary | ICD-10-CM | POA: Diagnosis not present

## 2016-01-31 DIAGNOSIS — M6281 Muscle weakness (generalized): Secondary | ICD-10-CM | POA: Diagnosis not present

## 2016-01-31 DIAGNOSIS — K219 Gastro-esophageal reflux disease without esophagitis: Secondary | ICD-10-CM | POA: Diagnosis not present

## 2016-01-31 DIAGNOSIS — N189 Chronic kidney disease, unspecified: Secondary | ICD-10-CM | POA: Diagnosis not present

## 2016-01-31 DIAGNOSIS — R2681 Unsteadiness on feet: Secondary | ICD-10-CM | POA: Diagnosis not present

## 2016-01-31 DIAGNOSIS — E785 Hyperlipidemia, unspecified: Secondary | ICD-10-CM | POA: Diagnosis not present

## 2016-02-02 DIAGNOSIS — I10 Essential (primary) hypertension: Secondary | ICD-10-CM | POA: Diagnosis not present

## 2016-02-02 DIAGNOSIS — R2681 Unsteadiness on feet: Secondary | ICD-10-CM | POA: Diagnosis not present

## 2016-02-02 DIAGNOSIS — M6281 Muscle weakness (generalized): Secondary | ICD-10-CM | POA: Diagnosis not present

## 2016-02-02 DIAGNOSIS — N189 Chronic kidney disease, unspecified: Secondary | ICD-10-CM | POA: Diagnosis not present

## 2016-02-02 DIAGNOSIS — K219 Gastro-esophageal reflux disease without esophagitis: Secondary | ICD-10-CM | POA: Diagnosis not present

## 2016-02-02 DIAGNOSIS — E785 Hyperlipidemia, unspecified: Secondary | ICD-10-CM | POA: Diagnosis not present

## 2016-02-05 DIAGNOSIS — E785 Hyperlipidemia, unspecified: Secondary | ICD-10-CM | POA: Diagnosis not present

## 2016-02-05 DIAGNOSIS — R2681 Unsteadiness on feet: Secondary | ICD-10-CM | POA: Diagnosis not present

## 2016-02-05 DIAGNOSIS — K219 Gastro-esophageal reflux disease without esophagitis: Secondary | ICD-10-CM | POA: Diagnosis not present

## 2016-02-05 DIAGNOSIS — I10 Essential (primary) hypertension: Secondary | ICD-10-CM | POA: Diagnosis not present

## 2016-02-05 DIAGNOSIS — N189 Chronic kidney disease, unspecified: Secondary | ICD-10-CM | POA: Diagnosis not present

## 2016-02-05 DIAGNOSIS — M6281 Muscle weakness (generalized): Secondary | ICD-10-CM | POA: Diagnosis not present

## 2016-02-07 DIAGNOSIS — N189 Chronic kidney disease, unspecified: Secondary | ICD-10-CM | POA: Diagnosis not present

## 2016-02-07 DIAGNOSIS — E785 Hyperlipidemia, unspecified: Secondary | ICD-10-CM | POA: Diagnosis not present

## 2016-02-07 DIAGNOSIS — M6281 Muscle weakness (generalized): Secondary | ICD-10-CM | POA: Diagnosis not present

## 2016-02-07 DIAGNOSIS — K219 Gastro-esophageal reflux disease without esophagitis: Secondary | ICD-10-CM | POA: Diagnosis not present

## 2016-02-07 DIAGNOSIS — R2681 Unsteadiness on feet: Secondary | ICD-10-CM | POA: Diagnosis not present

## 2016-02-07 DIAGNOSIS — I10 Essential (primary) hypertension: Secondary | ICD-10-CM | POA: Diagnosis not present

## 2016-02-09 DIAGNOSIS — M6281 Muscle weakness (generalized): Secondary | ICD-10-CM | POA: Diagnosis not present

## 2016-02-09 DIAGNOSIS — K219 Gastro-esophageal reflux disease without esophagitis: Secondary | ICD-10-CM | POA: Diagnosis not present

## 2016-02-09 DIAGNOSIS — R2681 Unsteadiness on feet: Secondary | ICD-10-CM | POA: Diagnosis not present

## 2016-02-09 DIAGNOSIS — E785 Hyperlipidemia, unspecified: Secondary | ICD-10-CM | POA: Diagnosis not present

## 2016-02-09 DIAGNOSIS — I10 Essential (primary) hypertension: Secondary | ICD-10-CM | POA: Diagnosis not present

## 2016-02-09 DIAGNOSIS — N189 Chronic kidney disease, unspecified: Secondary | ICD-10-CM | POA: Diagnosis not present

## 2016-02-12 DIAGNOSIS — E785 Hyperlipidemia, unspecified: Secondary | ICD-10-CM | POA: Diagnosis not present

## 2016-02-12 DIAGNOSIS — I10 Essential (primary) hypertension: Secondary | ICD-10-CM | POA: Diagnosis not present

## 2016-02-12 DIAGNOSIS — M6281 Muscle weakness (generalized): Secondary | ICD-10-CM | POA: Diagnosis not present

## 2016-02-12 DIAGNOSIS — K219 Gastro-esophageal reflux disease without esophagitis: Secondary | ICD-10-CM | POA: Diagnosis not present

## 2016-02-12 DIAGNOSIS — R2681 Unsteadiness on feet: Secondary | ICD-10-CM | POA: Diagnosis not present

## 2016-02-12 DIAGNOSIS — N189 Chronic kidney disease, unspecified: Secondary | ICD-10-CM | POA: Diagnosis not present

## 2016-02-14 DIAGNOSIS — I10 Essential (primary) hypertension: Secondary | ICD-10-CM | POA: Diagnosis not present

## 2016-02-14 DIAGNOSIS — E785 Hyperlipidemia, unspecified: Secondary | ICD-10-CM | POA: Diagnosis not present

## 2016-02-14 DIAGNOSIS — K219 Gastro-esophageal reflux disease without esophagitis: Secondary | ICD-10-CM | POA: Diagnosis not present

## 2016-02-14 DIAGNOSIS — M6281 Muscle weakness (generalized): Secondary | ICD-10-CM | POA: Diagnosis not present

## 2016-02-14 DIAGNOSIS — N189 Chronic kidney disease, unspecified: Secondary | ICD-10-CM | POA: Diagnosis not present

## 2016-02-14 DIAGNOSIS — R2681 Unsteadiness on feet: Secondary | ICD-10-CM | POA: Diagnosis not present

## 2016-02-16 DIAGNOSIS — M6281 Muscle weakness (generalized): Secondary | ICD-10-CM | POA: Diagnosis not present

## 2016-02-16 DIAGNOSIS — R2681 Unsteadiness on feet: Secondary | ICD-10-CM | POA: Diagnosis not present

## 2016-02-16 DIAGNOSIS — I10 Essential (primary) hypertension: Secondary | ICD-10-CM | POA: Diagnosis not present

## 2016-02-16 DIAGNOSIS — K219 Gastro-esophageal reflux disease without esophagitis: Secondary | ICD-10-CM | POA: Diagnosis not present

## 2016-02-16 DIAGNOSIS — N189 Chronic kidney disease, unspecified: Secondary | ICD-10-CM | POA: Diagnosis not present

## 2016-02-16 DIAGNOSIS — E785 Hyperlipidemia, unspecified: Secondary | ICD-10-CM | POA: Diagnosis not present

## 2016-02-19 DIAGNOSIS — K219 Gastro-esophageal reflux disease without esophagitis: Secondary | ICD-10-CM | POA: Diagnosis not present

## 2016-02-19 DIAGNOSIS — F418 Other specified anxiety disorders: Secondary | ICD-10-CM | POA: Diagnosis not present

## 2016-02-19 DIAGNOSIS — I2510 Atherosclerotic heart disease of native coronary artery without angina pectoris: Secondary | ICD-10-CM | POA: Diagnosis not present

## 2016-02-19 DIAGNOSIS — I10 Essential (primary) hypertension: Secondary | ICD-10-CM | POA: Diagnosis not present

## 2016-02-19 DIAGNOSIS — D509 Iron deficiency anemia, unspecified: Secondary | ICD-10-CM | POA: Diagnosis not present

## 2016-02-19 DIAGNOSIS — E785 Hyperlipidemia, unspecified: Secondary | ICD-10-CM | POA: Diagnosis not present

## 2016-02-19 DIAGNOSIS — R2681 Unsteadiness on feet: Secondary | ICD-10-CM | POA: Diagnosis not present

## 2016-02-19 DIAGNOSIS — N189 Chronic kidney disease, unspecified: Secondary | ICD-10-CM | POA: Diagnosis not present

## 2016-02-19 DIAGNOSIS — M6281 Muscle weakness (generalized): Secondary | ICD-10-CM | POA: Diagnosis not present

## 2016-02-21 DIAGNOSIS — R2681 Unsteadiness on feet: Secondary | ICD-10-CM | POA: Diagnosis not present

## 2016-02-21 DIAGNOSIS — K219 Gastro-esophageal reflux disease without esophagitis: Secondary | ICD-10-CM | POA: Diagnosis not present

## 2016-02-21 DIAGNOSIS — E785 Hyperlipidemia, unspecified: Secondary | ICD-10-CM | POA: Diagnosis not present

## 2016-02-21 DIAGNOSIS — I10 Essential (primary) hypertension: Secondary | ICD-10-CM | POA: Diagnosis not present

## 2016-02-21 DIAGNOSIS — M6281 Muscle weakness (generalized): Secondary | ICD-10-CM | POA: Diagnosis not present

## 2016-02-21 DIAGNOSIS — N189 Chronic kidney disease, unspecified: Secondary | ICD-10-CM | POA: Diagnosis not present

## 2016-02-26 DIAGNOSIS — I10 Essential (primary) hypertension: Secondary | ICD-10-CM | POA: Diagnosis not present

## 2016-02-26 DIAGNOSIS — E785 Hyperlipidemia, unspecified: Secondary | ICD-10-CM | POA: Diagnosis not present

## 2016-02-26 DIAGNOSIS — R2681 Unsteadiness on feet: Secondary | ICD-10-CM | POA: Diagnosis not present

## 2016-02-26 DIAGNOSIS — M6281 Muscle weakness (generalized): Secondary | ICD-10-CM | POA: Diagnosis not present

## 2016-02-26 DIAGNOSIS — K219 Gastro-esophageal reflux disease without esophagitis: Secondary | ICD-10-CM | POA: Diagnosis not present

## 2016-02-26 DIAGNOSIS — N189 Chronic kidney disease, unspecified: Secondary | ICD-10-CM | POA: Diagnosis not present

## 2016-02-29 DIAGNOSIS — Z23 Encounter for immunization: Secondary | ICD-10-CM | POA: Diagnosis not present

## 2016-03-01 DIAGNOSIS — N189 Chronic kidney disease, unspecified: Secondary | ICD-10-CM | POA: Diagnosis not present

## 2016-03-01 DIAGNOSIS — R2681 Unsteadiness on feet: Secondary | ICD-10-CM | POA: Diagnosis not present

## 2016-03-01 DIAGNOSIS — I10 Essential (primary) hypertension: Secondary | ICD-10-CM | POA: Diagnosis not present

## 2016-03-01 DIAGNOSIS — K219 Gastro-esophageal reflux disease without esophagitis: Secondary | ICD-10-CM | POA: Diagnosis not present

## 2016-03-01 DIAGNOSIS — M6281 Muscle weakness (generalized): Secondary | ICD-10-CM | POA: Diagnosis not present

## 2016-03-01 DIAGNOSIS — E785 Hyperlipidemia, unspecified: Secondary | ICD-10-CM | POA: Diagnosis not present

## 2016-03-08 DIAGNOSIS — D509 Iron deficiency anemia, unspecified: Secondary | ICD-10-CM | POA: Diagnosis not present

## 2016-03-08 DIAGNOSIS — G629 Polyneuropathy, unspecified: Secondary | ICD-10-CM | POA: Diagnosis not present

## 2016-03-08 DIAGNOSIS — D81818 Other biotin-dependent carboxylase deficiency: Secondary | ICD-10-CM | POA: Diagnosis not present

## 2016-03-08 DIAGNOSIS — K219 Gastro-esophageal reflux disease without esophagitis: Secondary | ICD-10-CM | POA: Diagnosis not present

## 2016-03-08 DIAGNOSIS — E538 Deficiency of other specified B group vitamins: Secondary | ICD-10-CM | POA: Diagnosis not present

## 2016-03-08 DIAGNOSIS — F329 Major depressive disorder, single episode, unspecified: Secondary | ICD-10-CM | POA: Diagnosis not present

## 2016-03-08 DIAGNOSIS — I10 Essential (primary) hypertension: Secondary | ICD-10-CM | POA: Diagnosis not present

## 2016-03-08 DIAGNOSIS — R4182 Altered mental status, unspecified: Secondary | ICD-10-CM | POA: Diagnosis not present

## 2016-03-08 DIAGNOSIS — I25119 Atherosclerotic heart disease of native coronary artery with unspecified angina pectoris: Secondary | ICD-10-CM | POA: Diagnosis not present

## 2016-03-08 DIAGNOSIS — I25111 Atherosclerotic heart disease of native coronary artery with angina pectoris with documented spasm: Secondary | ICD-10-CM | POA: Diagnosis not present

## 2016-03-27 DIAGNOSIS — C44222 Squamous cell carcinoma of skin of right ear and external auricular canal: Secondary | ICD-10-CM | POA: Diagnosis not present

## 2016-03-27 DIAGNOSIS — D485 Neoplasm of uncertain behavior of skin: Secondary | ICD-10-CM | POA: Diagnosis not present

## 2016-03-27 DIAGNOSIS — L820 Inflamed seborrheic keratosis: Secondary | ICD-10-CM | POA: Diagnosis not present

## 2016-03-27 DIAGNOSIS — C4441 Basal cell carcinoma of skin of scalp and neck: Secondary | ICD-10-CM | POA: Diagnosis not present

## 2016-03-27 DIAGNOSIS — L570 Actinic keratosis: Secondary | ICD-10-CM | POA: Diagnosis not present

## 2016-03-27 DIAGNOSIS — L821 Other seborrheic keratosis: Secondary | ICD-10-CM | POA: Diagnosis not present

## 2016-03-27 DIAGNOSIS — L814 Other melanin hyperpigmentation: Secondary | ICD-10-CM | POA: Diagnosis not present

## 2016-04-16 DIAGNOSIS — I10 Essential (primary) hypertension: Secondary | ICD-10-CM | POA: Diagnosis not present

## 2016-04-16 DIAGNOSIS — G629 Polyneuropathy, unspecified: Secondary | ICD-10-CM | POA: Diagnosis not present

## 2016-04-16 DIAGNOSIS — E538 Deficiency of other specified B group vitamins: Secondary | ICD-10-CM | POA: Diagnosis not present

## 2016-04-16 DIAGNOSIS — K219 Gastro-esophageal reflux disease without esophagitis: Secondary | ICD-10-CM | POA: Diagnosis not present

## 2016-04-16 DIAGNOSIS — I25119 Atherosclerotic heart disease of native coronary artery with unspecified angina pectoris: Secondary | ICD-10-CM | POA: Diagnosis not present

## 2016-04-16 DIAGNOSIS — D509 Iron deficiency anemia, unspecified: Secondary | ICD-10-CM | POA: Diagnosis not present

## 2016-04-16 DIAGNOSIS — F329 Major depressive disorder, single episode, unspecified: Secondary | ICD-10-CM | POA: Diagnosis not present

## 2016-04-16 DIAGNOSIS — R4182 Altered mental status, unspecified: Secondary | ICD-10-CM | POA: Diagnosis not present

## 2016-05-23 ENCOUNTER — Other Ambulatory Visit: Payer: Self-pay | Admitting: Internal Medicine

## 2016-05-23 DIAGNOSIS — Z0000 Encounter for general adult medical examination without abnormal findings: Secondary | ICD-10-CM | POA: Diagnosis not present

## 2016-05-23 DIAGNOSIS — I10 Essential (primary) hypertension: Secondary | ICD-10-CM | POA: Diagnosis not present

## 2016-05-23 DIAGNOSIS — K219 Gastro-esophageal reflux disease without esophagitis: Secondary | ICD-10-CM | POA: Diagnosis not present

## 2016-05-23 DIAGNOSIS — E559 Vitamin D deficiency, unspecified: Secondary | ICD-10-CM | POA: Diagnosis not present

## 2016-05-23 DIAGNOSIS — Z79899 Other long term (current) drug therapy: Secondary | ICD-10-CM | POA: Diagnosis not present

## 2016-05-23 DIAGNOSIS — E538 Deficiency of other specified B group vitamins: Secondary | ICD-10-CM | POA: Diagnosis not present

## 2016-05-23 DIAGNOSIS — R4182 Altered mental status, unspecified: Secondary | ICD-10-CM | POA: Diagnosis not present

## 2016-05-23 DIAGNOSIS — G629 Polyneuropathy, unspecified: Secondary | ICD-10-CM | POA: Diagnosis not present

## 2016-05-23 DIAGNOSIS — I25119 Atherosclerotic heart disease of native coronary artery with unspecified angina pectoris: Secondary | ICD-10-CM | POA: Diagnosis not present

## 2016-05-23 DIAGNOSIS — L989 Disorder of the skin and subcutaneous tissue, unspecified: Secondary | ICD-10-CM | POA: Diagnosis not present

## 2016-05-23 DIAGNOSIS — Z1389 Encounter for screening for other disorder: Secondary | ICD-10-CM | POA: Diagnosis not present

## 2016-05-23 DIAGNOSIS — C44519 Basal cell carcinoma of skin of other part of trunk: Secondary | ICD-10-CM | POA: Diagnosis not present

## 2016-05-23 DIAGNOSIS — F322 Major depressive disorder, single episode, severe without psychotic features: Secondary | ICD-10-CM | POA: Diagnosis not present

## 2016-05-23 DIAGNOSIS — D509 Iron deficiency anemia, unspecified: Secondary | ICD-10-CM | POA: Diagnosis not present

## 2016-05-29 DIAGNOSIS — C44529 Squamous cell carcinoma of skin of other part of trunk: Secondary | ICD-10-CM | POA: Diagnosis not present

## 2016-05-29 DIAGNOSIS — D485 Neoplasm of uncertain behavior of skin: Secondary | ICD-10-CM | POA: Diagnosis not present

## 2016-05-29 DIAGNOSIS — C44222 Squamous cell carcinoma of skin of right ear and external auricular canal: Secondary | ICD-10-CM | POA: Diagnosis not present

## 2016-05-29 DIAGNOSIS — C4441 Basal cell carcinoma of skin of scalp and neck: Secondary | ICD-10-CM | POA: Diagnosis not present

## 2016-06-20 DIAGNOSIS — C44319 Basal cell carcinoma of skin of other parts of face: Secondary | ICD-10-CM | POA: Diagnosis not present

## 2016-06-20 DIAGNOSIS — C44222 Squamous cell carcinoma of skin of right ear and external auricular canal: Secondary | ICD-10-CM | POA: Diagnosis not present

## 2016-07-04 DIAGNOSIS — Z85828 Personal history of other malignant neoplasm of skin: Secondary | ICD-10-CM | POA: Diagnosis not present

## 2016-07-04 DIAGNOSIS — C44519 Basal cell carcinoma of skin of other part of trunk: Secondary | ICD-10-CM | POA: Diagnosis not present

## 2016-07-18 DIAGNOSIS — C44719 Basal cell carcinoma of skin of left lower limb, including hip: Secondary | ICD-10-CM | POA: Diagnosis not present

## 2016-08-14 ENCOUNTER — Encounter: Payer: Self-pay | Admitting: Interventional Cardiology

## 2016-08-25 NOTE — Progress Notes (Signed)
Patient ID: Scott [name], male   DOB: 1923/07/19, 83 y.o.   MRN: [id number]     Cardiology Office Note   Date:  08/26/2016   ID:  [name], DOB [birth year], MRN [id number]  PCP:  Henrine Screws, MD    No chief complaint on file. f/u CAD   Wt Readings from Last 3 Encounters:  08/26/16 158 lb (71.7 kg)  01/24/16 158 lb 15.2 oz (72.1 kg)  08/02/15 160 lb 12.8 oz (72.9 kg)       History of Present Illness: Scott [name] is a 83 y.o. male  Who has history of CAD status post CABG in 57 in [place], [place]. He was admitted with a non-STEMI in 2007 and grafts were patent at that time but there was distal disease in the circumflex and is treated medically. He EF was 40-45% that time. He returned with a non-STEMI on 10/13/14 and underwent cardiac catheterization by Dr. [name]. He had a patent LIMA to the LAD and diagonal branch and patent dominant RCA. He had occluded vein grafts. Medical therapy was recommended. Toprol was stopped due to bradycardia.   He still walks regularly, every other day for 30 minutes (about 0.5 miles) and does not have any chest discomfort.  He goes to his gym and uses some light weights.  No bleeding problems.  He has some leg pains.  He uses a cane.  NO recent falls.  No NTG use recently.  He reports a lack of sex drive and wonders if this is related to his heart.  He is just not interested.    Past Medical History:  Diagnosis Date  . Anxiety   . Bradycardia    a. BB d/c'd in 2016.  [name] CAD (coronary artery disease)    a. 1983 s/p CABG [facility] [place]);  b. 2007 NSTEMI->med Rx for distal LCX dzs; c. 09/2014 NSTEMI/Cath: LIMA->LAD & diag ok, native RCA dominant and patent.   VG->RCA 100, VG->LCX 100 -->Med Rx.  . CKD (chronic kidney disease), stage III   . Claudication ([place])    BLE  . Depression   . Diastolic dysfunction    a. 10/2014 Echo: F 60-65%, Gr1 DD, mild AS, mild AI, mild to mod MR, PASP 41mHg.  .Marland KitchenDyspnea 2010   syndrome -  extensive  . Erectile dysfunction   . GERD (gastroesophageal reflux disease)   . H/O hiatal hernia   . History of blood transfusion 2014  . Hx of adenomatous colonic polyps 2007  . Hyperlipidemia   . Hypertensive heart disease   . Inguinal hernia   . Iron deficiency anemia   . Shingles 1990's  . Sigmoid diverticulosis   . Upper GI bleeding 2014   Gastritis and possible duodenal ulcer seen on EGD, felt secondary to NSAIDs  . Urethral stricture 11/2008  . Vasovagal syncope 05/2006  . Weight loss     Past Surgical History:  Procedure Laterality Date  . CARDIAC CATHETERIZATION  2007   Left main 100%, RCA 70% ostial, all grafts were patent, EF 40-45 percent, distal circumflex subtotal after graft insertion, medical therapy   . CARDIAC CATHETERIZATION N/A 10/14/2014   Procedure: Left Heart Cath and Cors/Grafts Angiography;  Surgeon: JLorretta Harp MD;  [facility];  Service: Cardiovascular;  Laterality: N/A;  . CHOLECYSTECTOMY  1980's  . CORONARY ARTERY BYPASS GRAFT  1994    LIMA-LAD, SVG-D1, SVG-OM,  SVG[place]RCA[place] ESOPHAGOGASTRODUODENOSCOPY N/A 03/25/2013  Procedure: ESOPHAGOGASTRODUODENOSCOPY (EGD);  Surgeon: Missy Sabins, MD;  gastritis, cannot rule out duodenal ulcer   . INGUINAL HERNIA REPAIR Left   . TRANSURETHRAL RESECTION OF PROSTATE       Current Outpatient Prescriptions  Medication Sig Dispense Refill  . acetaminophen (TYLENOL) 325 MG tablet Take 2 tablets (650 mg total) by mouth every 4 (four) hours as needed for headache or mild pain.    [name] albuterol (PROAIR HFA) 108 (90 Base) MCG/ACT inhaler Inhale 2 puffs into the lungs every 6 (six) hours as needed for wheezing or shortness of breath.    [name] aspirin EC 81 MG tablet Take 81 mg by mouth daily.    [name] desvenlafaxine (PRISTIQ) 50 MG 24 hr tablet Take 25 mg by mouth every evening.     [name] esomeprazole (NEXIUM) 40 MG capsule Take 40 mg by mouth daily as needed (acid reflux/  heartburn).    . ferrous sulfate (KP FERROUS SULFATE) 325 (65 FE) MG tablet Take 325 mg by mouth daily with breakfast.     . isosorbide mononitrate (IMDUR) 30 MG 24 hr tablet Take 30 mg by mouth daily.    [name] lisinopril (PRINIVIL,ZESTRIL) 10 MG tablet Take 2 tablets (20 mg total) by mouth daily. 60 tablet 0  . nitroGLYCERIN (NITROSTAT) 0.4 MG SL tablet Place 1 tablet (0.4 mg total) under the tongue every 5 (five) minutes x 3 doses as needed for chest pain. 25 tablet 4  . Omega-3 Fatty Acids (FISH OIL PO) Take 1 capsule by mouth daily.    . rosuvastatin (CRESTOR) 10 MG tablet Take 5 mg by mouth every evening.     . vitamin B-12 (CYANOCOBALAMIN) 1000 MCG tablet Take 1,000 mcg by mouth every evening.     [name] LYRICA 50 MG capsule      No current facility-administered medications for this visit.     Allergies:   Patient has no known allergies.    Social History:  The patient  reports that he has never smoked. He has never used smokeless tobacco. He reports that he drinks about 4.2 oz of alcohol per week . He reports that he does not use drugs.   Family History:  The patient's family history includes Heart disease in his father and mother; Heart failure in his brother; Hypertension in his mother.    ROS:  Please see the history of present illness.   Otherwise, review of systems are positive for leg pain, knee pain, needs a cane- decreased libido.   All other systems are reviewed and negative.    PHYSICAL EXAM: VS:  BP 126/62   Pulse 61   Ht '5\' 7"'$  (1.702 m)   Wt 158 lb (71.7 kg)   SpO2 98%   BMI 24.75 kg/m  , BMI Body mass index is 24.75 kg/m. GEN: Well nourished, well developed, in no acute distress  HEENT: normal  Neck: no JVD, carotid bruits, or masses Cardiac: RRR; no murmurs, rubs, or gallops,no edema  Respiratory:  clear to auscultation bilaterally, normal work of breathing GI: soft, nontender, nondistended, + BS MS: no deformity or atrophy  Skin: warm and dry, no rash Neuro:   Strength and sensation are intact Psych: euthymic mood, full affect    Recent Labs: 01/22/2016: ALT 18; TSH 2.186 01/24/2016: BUN 25; Creatinine, Ser 1.69; Hemoglobin 12.9; Platelets 139; Potassium 4.0; Sodium 140   Lipid Panel    Component Value Date/Time   CHOL 175 10/14/2014 0314   TRIG 108 10/14/2014 0314  HDL 39 (L) 10/14/2014 0314   CHOLHDL 4.5 10/14/2014 0314   VLDL 22 10/14/2014 0314   LDLCALC 114 (H) 10/14/2014 0314     Other studies Reviewed: Additional studies/ records that were reviewed today with results demonstrating: cath results as above; lipids as noted.   ASSESSMENT AND PLAN:        1. CAD: Continue aspirin and imdur for CAD sx.  Angina well controlled. Graft disease noted at the cath in 2016. Occluded SVG to RCA and occluded SVG to circumflex. He has flow in his native RCA which was thought to be adequate. Continue medical management at this point. No anginal symptoms on current medical therapy.  Tolerating meds well.  No bleeding issues.  Walk as much as possible. 2. hypertension: Well controlled. Continue current blood pressure medications.  Follow up with Dr. Inda Merlin as well.  Will get most recent labs. 3. hyperlipidemia: LDL was 114 at last check. This is above target but he has had difficulty tolerating any higher doses of Crestor. Continue current dose of lipid-lowering therapy.  WOuld not be more aggressive at this time.  4. anemia From GI bleeding in the past: Resolved.  This is been an issue for him in the past. Not having symptoms of anemia at this time. It does limit how aggressive we can be with anticoagulation and antiplatelet therapy.Tolerating baby aspirin. 5. Decreased libido: I think this may be normal for his age.  I don't think it represents a cardiac issue.  He will f/u with Dr. Inda Merlin regarding this issue.    Current medicines are reviewed at length with the patient today.  The patient concerns regarding his medicines were addressed.  The  following changes have been made:  No change  Labs/ tests ordered today include: none No orders of the defined types were placed in this encounter.   Recommend 150 minutes/week of aerobic exercise Low fat, low carb, high fiber diet recommended  Disposition:   FU in 1 year   Signed, Larae Grooms, MD  08/26/2016 10:28 AM    [facility] [address], [place], [place]  [address] Phone: [phone]; Fax: [phone]

## 2016-08-26 ENCOUNTER — Ambulatory Visit (INDEPENDENT_AMBULATORY_CARE_PROVIDER_SITE_OTHER): Payer: Medicare Other | Admitting: Interventional Cardiology

## 2016-08-26 ENCOUNTER — Encounter: Payer: Self-pay | Admitting: Interventional Cardiology

## 2016-08-26 VITALS — BP 126/62 | HR 61 | Ht 67.0 in | Wt 158.0 lb

## 2016-08-26 DIAGNOSIS — E782 Mixed hyperlipidemia: Secondary | ICD-10-CM

## 2016-08-26 DIAGNOSIS — R6882 Decreased libido: Secondary | ICD-10-CM | POA: Diagnosis not present

## 2016-08-26 DIAGNOSIS — I25810 Atherosclerosis of coronary artery bypass graft(s) without angina pectoris: Secondary | ICD-10-CM | POA: Diagnosis not present

## 2016-08-26 DIAGNOSIS — I10 Essential (primary) hypertension: Secondary | ICD-10-CM | POA: Diagnosis not present

## 2016-08-26 NOTE — Patient Instructions (Signed)
Medication Instructions:  Your physician recommends that you continue on your current medications as directed. Please refer to the Current Medication list given to you today.   Labwork: None  Testing/Procedures: None  Follow-Up: Your physician wants you to follow-up in: 1 year with Dr. Irish Lack. You will receive a reminder letter in the mail two months in advance. If you don't receive a letter, please call our office to schedule the follow-up appointment.   Any Other Special Instructions Will Be Listed Below (If Applicable).     If you need a refill on your cardiac medications before your next appointment, please call your pharmacy.

## 2016-09-26 DIAGNOSIS — K219 Gastro-esophageal reflux disease without esophagitis: Secondary | ICD-10-CM | POA: Diagnosis not present

## 2016-09-26 DIAGNOSIS — R252 Cramp and spasm: Secondary | ICD-10-CM | POA: Diagnosis not present

## 2016-09-26 DIAGNOSIS — I25119 Atherosclerotic heart disease of native coronary artery with unspecified angina pectoris: Secondary | ICD-10-CM | POA: Diagnosis not present

## 2016-09-26 DIAGNOSIS — E538 Deficiency of other specified B group vitamins: Secondary | ICD-10-CM | POA: Diagnosis not present

## 2016-09-26 DIAGNOSIS — I10 Essential (primary) hypertension: Secondary | ICD-10-CM | POA: Diagnosis not present

## 2016-09-26 DIAGNOSIS — F322 Major depressive disorder, single episode, severe without psychotic features: Secondary | ICD-10-CM | POA: Diagnosis not present

## 2016-09-26 DIAGNOSIS — F0390 Unspecified dementia without behavioral disturbance: Secondary | ICD-10-CM | POA: Diagnosis not present

## 2016-09-26 DIAGNOSIS — L820 Inflamed seborrheic keratosis: Secondary | ICD-10-CM | POA: Diagnosis not present

## 2016-09-26 DIAGNOSIS — E559 Vitamin D deficiency, unspecified: Secondary | ICD-10-CM | POA: Diagnosis not present

## 2016-09-26 DIAGNOSIS — G629 Polyneuropathy, unspecified: Secondary | ICD-10-CM | POA: Diagnosis not present

## 2016-09-26 DIAGNOSIS — D509 Iron deficiency anemia, unspecified: Secondary | ICD-10-CM | POA: Diagnosis not present

## 2016-09-26 DIAGNOSIS — L989 Disorder of the skin and subcutaneous tissue, unspecified: Secondary | ICD-10-CM | POA: Diagnosis not present

## 2016-10-30 DIAGNOSIS — C4441 Basal cell carcinoma of skin of scalp and neck: Secondary | ICD-10-CM | POA: Diagnosis not present

## 2016-10-30 DIAGNOSIS — C44222 Squamous cell carcinoma of skin of right ear and external auricular canal: Secondary | ICD-10-CM | POA: Diagnosis not present

## 2016-10-30 DIAGNOSIS — L570 Actinic keratosis: Secondary | ICD-10-CM | POA: Diagnosis not present

## 2016-10-30 DIAGNOSIS — C44519 Basal cell carcinoma of skin of other part of trunk: Secondary | ICD-10-CM | POA: Diagnosis not present

## 2017-02-26 DIAGNOSIS — Z23 Encounter for immunization: Secondary | ICD-10-CM | POA: Diagnosis not present

## 2017-02-26 DIAGNOSIS — L814 Other melanin hyperpigmentation: Secondary | ICD-10-CM | POA: Diagnosis not present

## 2017-02-26 DIAGNOSIS — C44519 Basal cell carcinoma of skin of other part of trunk: Secondary | ICD-10-CM | POA: Diagnosis not present

## 2017-02-26 DIAGNOSIS — L570 Actinic keratosis: Secondary | ICD-10-CM | POA: Diagnosis not present

## 2017-03-03 ENCOUNTER — Emergency Department (HOSPITAL_COMMUNITY)
Admission: EM | Admit: 2017-03-03 | Discharge: 2017-03-03 | Disposition: A | Discharge status: Home / Self Care | Payer: Medicare Other | Attending: Emergency Medicine | Admitting: Emergency Medicine

## 2017-03-03 ENCOUNTER — Encounter (HOSPITAL_COMMUNITY): Payer: Self-pay | Admitting: Emergency Medicine

## 2017-03-03 ENCOUNTER — Emergency Department (HOSPITAL_COMMUNITY): Payer: Medicare Other

## 2017-03-03 DIAGNOSIS — N184 Chronic kidney disease, stage 4 (severe): Secondary | ICD-10-CM | POA: Insufficient documentation

## 2017-03-03 DIAGNOSIS — C3412 Malignant neoplasm of upper lobe, left bronchus or lung: Secondary | ICD-10-CM | POA: Insufficient documentation

## 2017-03-03 DIAGNOSIS — I130 Hypertensive heart and chronic kidney disease with heart failure and stage 1 through stage 4 chronic kidney disease, or unspecified chronic kidney disease: Secondary | ICD-10-CM | POA: Insufficient documentation

## 2017-03-03 DIAGNOSIS — J90 Pleural effusion, not elsewhere classified: Secondary | ICD-10-CM | POA: Insufficient documentation

## 2017-03-03 DIAGNOSIS — I509 Heart failure, unspecified: Secondary | ICD-10-CM | POA: Diagnosis not present

## 2017-03-03 DIAGNOSIS — I2510 Atherosclerotic heart disease of native coronary artery without angina pectoris: Secondary | ICD-10-CM | POA: Insufficient documentation

## 2017-03-03 DIAGNOSIS — Z7982 Long term (current) use of aspirin: Secondary | ICD-10-CM | POA: Diagnosis not present

## 2017-03-03 DIAGNOSIS — R0602 Shortness of breath: Secondary | ICD-10-CM | POA: Diagnosis not present

## 2017-03-03 DIAGNOSIS — Z79899 Other long term (current) drug therapy: Secondary | ICD-10-CM | POA: Insufficient documentation

## 2017-03-03 LAB — CBC WITH DIFFERENTIAL/PLATELET
BASOS ABS: 0 10*3/uL (ref 0.0–0.1)
BASOS PCT: 0 %
Eosinophils Absolute: 0.4 10*3/uL (ref 0.0–0.7)
Eosinophils Relative: 5 %
HEMATOCRIT: 40.3 % (ref 39.0–52.0)
Hemoglobin: 12.9 g/dL — ABNORMAL LOW (ref 13.0–17.0)
LYMPHS PCT: 17 %
Lymphs Abs: 1.4 10*3/uL (ref 0.7–4.0)
MCH: 28.5 pg (ref 26.0–34.0)
MCHC: 32 g/dL (ref 30.0–36.0)
MCV: 89.2 fL (ref 78.0–100.0)
Monocytes Absolute: 0.6 10*3/uL (ref 0.1–1.0)
Monocytes Relative: 7 %
Neutro Abs: 6.1 10*3/uL (ref 1.7–7.7)
Neutrophils Relative %: 71 %
PLATELETS: 173 10*3/uL (ref 150–400)
RBC: 4.52 MIL/uL (ref 4.22–5.81)
RDW: 13.5 % (ref 11.5–15.5)
WBC: 8.6 10*3/uL (ref 4.0–10.5)

## 2017-03-03 LAB — COMPREHENSIVE METABOLIC PANEL
ALT: 14 U/L — ABNORMAL LOW (ref 17–63)
ANION GAP: 7 (ref 5–15)
AST: 16 U/L (ref 15–41)
Albumin: 3.8 g/dL (ref 3.5–5.0)
Alkaline Phosphatase: 57 U/L (ref 38–126)
BILIRUBIN TOTAL: 0.5 mg/dL (ref 0.3–1.2)
BUN: 41 mg/dL — ABNORMAL HIGH (ref 6–20)
CALCIUM: 9.1 mg/dL (ref 8.9–10.3)
CO2: 23 mmol/L (ref 22–32)
Chloride: 107 mmol/L (ref 101–111)
Creatinine, Ser: 2.1 mg/dL — ABNORMAL HIGH (ref 0.61–1.24)
GFR, EST AFRICAN AMERICAN: 30 mL/min — AB (ref >60)
GFR, EST NON AFRICAN AMERICAN: 26 mL/min — AB (ref >60)
Glucose, Bld: 95 mg/dL (ref 65–99)
POTASSIUM: 5.3 mmol/L — AB (ref 3.5–5.1)
Sodium: 137 mmol/L (ref 135–145)
TOTAL PROTEIN: 6.8 g/dL (ref 6.5–8.1)

## 2017-03-03 MED ORDER — SODIUM CHLORIDE 0.9 % IV BOLUS (SEPSIS)
1000.0000 mL | Freq: Once | INTRAVENOUS | Status: AC
  Administered 2017-03-03: 1000 mL via INTRAVENOUS

## 2017-03-03 NOTE — ED Notes (Signed)
Coming from [place] walk in clinic-states possible pleural effusion-no respiratory distress

## 2017-03-03 NOTE — ED Provider Notes (Signed)
[facility] DEPT Provider Note   CSN: [id number] Arrival date & time: 03/03/17  1543     History   Chief Complaint Chief Complaint  Patient presents with  . Shortness of Breath    HPI Scott [name] is a 83 y.o. male.  HPI  83 y.o. male with a hx of CAD, CKD, Diastolic Dysfunction, HTN, HLD, presents to the Emergency Department today from PCP offuce due to plueal effusion noted on CXR. Notes symptoms yesterday due to dyspnea with exertion. Denies chest pain. No N/V. No diaphoresis. No chills or night sweats. No weight loss. Pt denies cough/cognestion. No URI symptoms. No fevers. Does endorse gradual worsening of shortness of breath over the past several weeks. No abd pain. No headaches. No visual changes. No pain. No other symptoms noted    Past Medical History:  Diagnosis Date  . Anxiety   . Bradycardia    a. BB d/c'd in 2016.  [name] CAD (coronary artery disease)    a. 1983 s/p CABG [facility] [place]);  b. 2007 NSTEMI->med Rx for distal LCX dzs; c. 09/2014 NSTEMI/Cath: LIMA->LAD & diag ok, native RCA dominant and patent.   VG->RCA 100, VG->LCX 100 -->Med Rx.  . CKD (chronic kidney disease), stage III ([place])   . Claudication ([place])    BLE  . Depression   . Diastolic dysfunction    a. 10/2014 Echo: F 60-65%, Gr1 DD, mild AS, mild AI, mild to mod MR, PASP 25mmHg.  [name] Dyspnea 2010   syndrome - extensive  . Erectile dysfunction   . GERD (gastroesophageal reflux disease)   . H/O hiatal hernia   . History of blood transfusion 2014  . Hx of adenomatous colonic polyps 2007  . Hyperlipidemia   . Hypertensive heart disease   . Inguinal hernia   . Iron deficiency anemia   . Shingles 1990's  . Sigmoid diverticulosis   . Upper GI bleeding 2014   Gastritis and possible duodenal ulcer seen on EGD, felt secondary to NSAIDs  . Urethral stricture 11/2008  . Vasovagal syncope 05/2006  . Weight loss     Patient Active Problem List   Diagnosis Date Noted  .  Decreased libido 08/26/2016  . Altered mental status 01/24/2016  . Acute encephalopathy 01/23/2016  . Delirium 01/22/2016  . Bradycardia, toprol stopped 10/15/2014  . CAD (coronary artery disease) of artery bypass graft, ALL VGs occluded, patent LIMA -->LAD, patent dominant RCA 10/15/2014  . Anemia 03/24/2013  . CKD (chronic kidney disease), stage IV ([place]) 03/24/2013  . Essential hypertension   . Anxiety   . Depression   . GERD (gastroesophageal reflux disease)   . Hyperlipidemia   . Coronary artery disease due to lipid rich plaque   . HIATAL HERNIA 09/21/2008  . CT, CHEST, ABNORMAL 09/21/2008    Past Surgical History:  Procedure Laterality Date  . CARDIAC CATHETERIZATION  2007   Left main 100%, RCA 70% ostial, all grafts were patent, EF 40-45 percent, distal circumflex subtotal after graft insertion, medical therapy   . CARDIAC CATHETERIZATION N/A 10/14/2014   Procedure: Left Heart Cath and Cors/Grafts Angiography;  Surgeon: Lorretta Harp, MD;  Location: [facility];  Service: Cardiovascular;  Laterality: N/A;  . CHOLECYSTECTOMY  1980's  . CORONARY ARTERY BYPASS GRAFT  1994    LIMA-LAD, SVG-D1, SVG-OM,  SVG-RCA; performed in [place], [place]  . ESOPHAGOGASTRODUODENOSCOPY N/A 03/25/2013   Procedure: ESOPHAGOGASTRODUODENOSCOPY (EGD);  Surgeon: Missy Sabins, MD;  gastritis, cannot rule out duodenal  ulcer   . INGUINAL HERNIA REPAIR Left   . TRANSURETHRAL RESECTION OF PROSTATE         Home Medications    Prior to Admission medications   Medication Sig Start Date End Date Taking? Authorizing Provider  acetaminophen (TYLENOL) 325 MG tablet Take 2 tablets (650 mg total) by mouth every 4 (four) hours as needed for headache or mild pain. 10/15/14   Isaiah Serge, NP  albuterol [facility] HFA) 108 (90 Base) MCG/ACT inhaler Inhale 2 puffs into the lungs every 6 (six) hours as needed for wheezing or shortness of breath.    [provider]  aspirin EC 81 MG tablet  Take 81 mg by mouth daily.    [provider]  desvenlafaxine (PRISTIQ) 50 MG 24 hr tablet Take 25 mg by mouth every evening.     [provider]  esomeprazole (NEXIUM) 40 MG capsule Take 40 mg by mouth daily as needed (acid reflux/ heartburn).    [provider]  ferrous sulfate (KP FERROUS SULFATE) 325 (65 FE) MG tablet Take 325 mg by mouth daily with breakfast.     [provider]  isosorbide mononitrate (IMDUR) 30 MG 24 hr tablet Take 30 mg by mouth daily.    [provider]  lisinopril (PRINIVIL,ZESTRIL) 10 MG tablet Take 2 tablets (20 mg total) by mouth daily. 01/25/16   Modena Jansky, MD  LYRICA 50 MG capsule  06/21/16   [provider]  nitroGLYCERIN (NITROSTAT) 0.4 MG SL tablet Place 1 tablet (0.4 mg total) under the tongue every 5 (five) minutes x 3 doses as needed for chest pain. 10/15/14   Isaiah Serge, NP  Omega-3 Fatty Acids (FISH OIL PO) Take 1 capsule by mouth daily.    [provider]  rosuvastatin (CRESTOR) 10 MG tablet Take 5 mg by mouth every evening.     [provider]  vitamin B-12 (CYANOCOBALAMIN) 1000 MCG tablet Take 1,000 mcg by mouth every evening.     [provider]    Family History Family History  Problem Relation Age of Onset  . Hypertension Mother   . Heart disease Mother   . Heart disease Father   . Heart failure Brother   . Heart attack Neg Hx   . Stroke Neg Hx     Social History Social History  Substance Use Topics  . Smoking status: Never Smoker  . Smokeless tobacco: Never Used  . Alcohol use 4.2 oz/week    7 Glasses of wine per week     Comment: wine - daily     Allergies   Patient has no known allergies.   Review of Systems Review of Systems ROS reviewed and all are negative for acute change except as noted in the HPI.  Physical Exam Updated Vital Signs BP (!) 160/60 (BP Location: Left Arm)   Pulse (!) 52   Temp (!) 97.5 F (36.4 C) (Oral)   Resp  17   Ht 5\' 6"  (1.676 m)   Wt 73 kg (161 lb)   SpO2 100%   BMI 25.99 kg/m   Physical Exam  Constitutional: He is oriented to person, place, and time. He appears well-developed and well-nourished. No distress.  HENT:  Head: Normocephalic and atraumatic.  Right Ear: Tympanic membrane, external ear and ear canal normal.  Left Ear: Tympanic membrane, external ear and ear canal normal.  Nose: Nose normal.  Mouth/Throat: Uvula is midline, oropharynx is clear and moist and mucous membranes  are normal. No trismus in the jaw. No oropharyngeal exudate, posterior oropharyngeal erythema or tonsillar abscesses.  Eyes: Pupils are equal, round, and reactive to light. EOM are normal.  Neck: Normal range of motion. Neck supple. No tracheal deviation present.  Cardiovascular: Normal rate, regular rhythm, S1 normal, S2 normal, normal heart sounds, intact distal pulses and normal pulses.   Pulmonary/Chest: Effort normal. No respiratory distress. He has decreased breath sounds in the left lower field. He has no wheezes. He has no rhonchi. He has no rales.  Abdominal: Normal appearance and bowel sounds are normal. There is no tenderness.  Musculoskeletal: Normal range of motion.  Neurological: He is alert and oriented to person, place, and time.  Skin: Skin is warm and dry.  Psychiatric: He has a normal mood and affect. His speech is normal and behavior is normal. Thought content normal.  Nursing note and vitals reviewed.    ED Treatments / Results  Labs (all labs ordered are listed, but only abnormal results are displayed) Labs Reviewed  COMPREHENSIVE METABOLIC PANEL - Abnormal; Notable for the following:       Result Value   Potassium 5.3 (*)    BUN 41 (*)    Creatinine, Ser 2.10 (*)    ALT 14 (*)    GFR calc non Af Amer 26 (*)    GFR calc Af Amer 30 (*)    All other components within normal limits  CBC WITH DIFFERENTIAL/PLATELET - Abnormal; Notable for the following:    Hemoglobin 12.9 (*)     All other components within normal limits    EKG  EKG Interpretation  Date/Time:  Monday March 03 2017 16:19:18 EDT Ventricular Rate:  59 PR Interval:    QRS Duration: 76 QT Interval:  401 QTC Calculation: 398 R Axis:   69 Text Interpretation:  Sinus rhythm Low voltage, precordial leads When compared with ECG of 01/23/2016, Limb lead reversal has been corrected When compared with ECG of 01/22/2016, No significant change was found Confirmed by Delora Fuel ([id number]) on 03/03/2017 5:03:36 PM       Radiology Dg Chest 2 View  Result Date: 03/03/2017 CLINICAL DATA:  Shortness of breath EXAM: CHEST  2 VIEW COMPARISON:  03/03/2017, 01/22/2016 FINDINGS: Post sternotomy changes. Right lung shows mild basilar atelectasis. Moderate left pleural effusion, stable to slight increase in size compared to prior. Dense airspace disease in the lingula and left base. Slightly enlarged cardiomediastinal silhouette. No pneumothorax. Degenerative changes of the spine. IMPRESSION: 1. Slight increased size of a moderate left pleural effusion with basilar atelectasis or pneumonia 2. Probable subsegmental atelectasis within the medial right lung base. Electronically Signed   By: Donavan Foil M.D.   On: 03/03/2017 17:14   Ct Chest Wo Contrast  Result Date: 03/03/2017 CLINICAL DATA:  Shortness of breath with pleural effusion. EXAM: CT CHEST WITHOUT CONTRAST TECHNIQUE: Multidetector CT imaging of the chest was performed following the standard protocol without IV contrast. COMPARISON:  Chest x-ray 03/03/2017 FINDINGS: Cardiovascular: The heart size is normal. No pericardial effusion. Patient is status post CABG Atherosclerotic calcification is noted in the wall of the thoracic aorta. Mediastinum/Nodes: No mediastinal lymphadenopathy. No evidence for gross hilar lymphadenopathy although assessment is limited by the lack of intravenous contrast on today's study. Calcified nodal tissue is seen in the left hilum. Large hiatal  hernia evident with organoaxial volvulus of the stomach. No evidence for gastric obstruction. No axillary lymphadenopathy. Lungs/Pleura: 2.4 x 3.6 cm masslike lesion is identified in the posterior  left upper lobe, along the major fissure. This is associated with areas of interlobular septal thickening in the left upper lobe. Small left pleural effusion is associated. Multiple scattered pulmonary nodules are evident, more apparent on the right than the left but some of these are calcified. Upper Abdomen: Calcified granulomata noted in the spleen. 19 mm right adrenal nodule has average attenuation most suggestive of benign adrenal adenoma. Musculoskeletal: Bone windows reveal no worrisome lytic or sclerotic osseous lesions. IMPRESSION: 1. 2.4 x 3.6 cm apparent mass in the posterior left upper lobe with associated left upper lobe interlobular septal thickening. Imaging features concerning for neoplasm with lymphangitic tumor spread. 2. Small left pleural effusion. 3. Large hiatal hernia contains nearly the entire stomach with evidence of organoaxial volvulus. No evidence for gastric obstruction. 4. 19 mm right adrenal adenoma. Electronically Signed   By: Misty Stanley M.D.   On: 03/03/2017 21:00    Procedures Procedures (including critical care time)  Medications Ordered in ED Medications  sodium chloride 0.9 % bolus 1,000 mL (1,000 mLs Intravenous New Bag/Given 03/03/17 2142)     Initial Impression / Assessment and Plan / ED Course  I have reviewed the triage vital signs and the nursing notes.  Pertinent labs & imaging results that were available during my care of the patient were reviewed by me and considered in my medical decision making (see chart for details).  Final Clinical Impressions(s) / ED Diagnoses  {I have reviewed and evaluated the relevant laboratory values. {I have reviewed and evaluated the relevant imaging studies. {I have interpreted the relevant EKG. {I have reviewed the relevant  previous healthcare records.  {I obtained HPI from historian. {Patient discussed with supervising physician.  ED Course:  Assessment: Pt is a 83 y.o. male with a hx of CAD, CKD, Diastolic Dysfunction, HTN, HLD, presents to the Emergency Department today from PCP offuce due to plueal effusion noted on CXR. Notes symptoms yesterday due to dyspnea with exertion. Denies chest pain. No N/V. No diaphoresis. No chills or night sweats. No weight loss. Pt denies cough/cognestion. No URI symptoms. No fevers. Does endorse gradual worsening of shortness of breath over the past several weeks. No abd pain. No headaches. No visual changes. No pain. On exam, pt in NAD. Nontoxic/nonseptic appearing. VSS. No hypoxia. Afebrile. Lungs with decreased sounds on left. Heart RRR. Abdomen nontender soft. CBC unremarkable. CMP with potassium 5.3. CXR with moderate left pleural effusion. Possible PNA. Due to absence of URI symptoms, there is concern for possible malignancy. CT Chest w/o contrast ordered due to creatinine, which showed 2.4 x 3.6cm mass in left upper lobe concerning for neoplasm. Seen by supervising physician. Consult to Oncology (Dr. Irene Limbo) will arrange outpatient follow up with Oncology. Pt stable for DC. Strict return precautions given. Plan is to DC home. At time of discharge, Patient is in no acute distress. Vital Signs are stable. Patient is able to ambulate. Patient able to tolerate PO.   Disposition/Plan:  DC Home Additional Verbal discharge instructions given and discussed with patient.  Pt Instructed to f/u with Oncology in the next week for evaluation and treatment of symptoms. Return precautions given Pt acknowledges and agrees with plan  Supervising Physician Milton Ferguson, MD  Final diagnoses:  Pleural effusion  Malignant neoplasm of upper lobe of left lung [facility])    New Prescriptions New Prescriptions   No medications on file     Conni Slipper 03/03/17 2152    Milton Ferguson,  MD 03/03/17 2309

## 2017-03-03 NOTE — ED Triage Notes (Signed)
Pt sent from doctor office for positive plueral effusion; pt had chest xray done today related to pt wheezing, SOB, and weakness onset last night.

## 2017-03-03 NOTE — Discharge Instructions (Signed)
Please read and follow all provided instructions.  Your diagnoses today include:  1. Pleural effusion   2. Malignant neoplasm of upper lobe of left lung ([place])     Tests performed today include: Vital signs. See below for your results today.   Medications prescribed:  Take as prescribed   Home care instructions:  Follow any educational materials contained in this packet.  Follow-up instructions: Please follow-up with Oncology for further evaluation of symptoms and treatment   Return instructions:  Please return to the Emergency Department if you do not get better, if you get worse, or new symptoms OR  - Fever (temperature greater than 101.80F)  - Bleeding that does not stop with holding pressure to the area    -Severe pain (please note that you may be more sore the day after your accident)  - Chest Pain  - Difficulty breathing  - Severe nausea or vomiting  - Inability to tolerate food and liquids  - Passing out  - Skin becoming red around your wounds  - Change in mental status (confusion or lethargy)  - New numbness or weakness    Please return if you have any other emergent concerns.  Additional Information:  Your vital signs today were: BP (!) 160/60 (BP Location: Left Arm)    Pulse (!) 52    Temp (!) 97.5 F (36.4 C) (Oral)    Resp 17    Ht 5\' 6"  (1.676 m)    Wt 73 kg (161 lb)    SpO2 100%    BMI 25.99 kg/m  If your blood pressure (BP) was elevated above 135/85 this visit, please have this repeated by your doctor within one month. ---------------

## 2017-03-06 ENCOUNTER — Telehealth: Payer: Self-pay | Admitting: Hematology

## 2017-03-06 ENCOUNTER — Encounter: Payer: Self-pay | Admitting: Hematology

## 2017-03-06 DIAGNOSIS — R0602 Shortness of breath: Secondary | ICD-10-CM | POA: Diagnosis not present

## 2017-03-06 DIAGNOSIS — J90 Pleural effusion, not elsewhere classified: Secondary | ICD-10-CM | POA: Diagnosis not present

## 2017-03-06 DIAGNOSIS — R918 Other nonspecific abnormal finding of lung field: Secondary | ICD-10-CM | POA: Diagnosis not present

## 2017-03-06 NOTE — Telephone Encounter (Signed)
Appt has been scheduled for the pt to see Dr. Irene Limbo on 10/24 at 11am. Aware to arrive 30 minutes early. Letter mailed.

## 2017-03-12 ENCOUNTER — Telehealth: Payer: Self-pay | Admitting: Hematology

## 2017-03-12 ENCOUNTER — Ambulatory Visit (HOSPITAL_BASED_OUTPATIENT_CLINIC_OR_DEPARTMENT_OTHER): Payer: Medicare Other | Admitting: Hematology

## 2017-03-12 ENCOUNTER — Encounter: Payer: Self-pay | Admitting: Hematology

## 2017-03-12 VITALS — BP 156/86 | HR 57 | Temp 98.0°F | Resp 16 | Ht 66.0 in | Wt 161.1 lb

## 2017-03-12 DIAGNOSIS — I25810 Atherosclerosis of coronary artery bypass graft(s) without angina pectoris: Secondary | ICD-10-CM

## 2017-03-12 DIAGNOSIS — R918 Other nonspecific abnormal finding of lung field: Secondary | ICD-10-CM

## 2017-03-12 DIAGNOSIS — C3490 Malignant neoplasm of unspecified part of unspecified bronchus or lung: Secondary | ICD-10-CM

## 2017-03-12 DIAGNOSIS — J90 Pleural effusion, not elsewhere classified: Secondary | ICD-10-CM | POA: Diagnosis not present

## 2017-03-12 DIAGNOSIS — N189 Chronic kidney disease, unspecified: Secondary | ICD-10-CM

## 2017-03-12 DIAGNOSIS — C3412 Malignant neoplasm of upper lobe, left bronchus or lung: Secondary | ICD-10-CM

## 2017-03-12 NOTE — Telephone Encounter (Signed)
Scheduled appt per 10/24 los [facility] radiology to contact patient with pet/ct. Gave patient AVS and calender per los.

## 2017-03-12 NOTE — Progress Notes (Signed)
HEMATOLOGY/ONCOLOGY CONSULTATION NOTE  Date of Service: 03/12/2017  Patient Care Team: Josetta Huddle, MD as PCP - General (Internal Medicine)  CHIEF COMPLAINTS/PURPOSE OF CONSULTATION:  Left upper lobe lung mass  HISTORY OF PRESENTING ILLNESS:   Scott [name] is a wonderful 83 y.o. male who has been referred to [place] by Dr. Josetta Huddle, MD for evaluation of suspected lung cancer.   He has a hx of CAD, CKD, DiastolicCHF, HTN, HLD. He presented to the ED on 03/03/2017 for SOB and was found to have pleural effusion. He was also to have a mass in the upper lobe of left lung after a CT chest Wo contrast with results showing:IMPRESSION:1. 2.4 x 3.6 cm apparent mass in the posterior left upper lobe with associated left upper lobe interlobular septal thickening. Imaging features concerning for neoplasm with lymphangitic tumor spread. 2. Small left pleural effusion. 3. Large hiatal hernia contains nearly the entire stomach with evidence of organoaxial volvulus. No evidence for gastric obstruction. 4. 19 mm right adrenal adenoma.  He is accompanied by his wife and daughter. He states that he began feeling SOB 2 weeks ago. He is not a smoker and has not smoked in the past. He states that he has not been evaluated by his cardiologist, Dr. Larae Grooms. For his recent SOB.  On review of systems, patient reports fatigue, intermittent SOB, and denies fever, chills, CP, cough, weight loss, and any other accompanying symptoms.  MEDICAL HISTORY:  Past Medical History:  Diagnosis Date  . Anxiety   . Bradycardia    a. BB d/c'd in 2016.  [name] CAD (coronary artery disease)    a. 1983 s/p CABG [facility] [place]);  b. 2007 NSTEMI->med Rx for distal LCX dzs; c. 09/2014 NSTEMI/Cath: LIMA->LAD & diag ok, native RCA dominant and patent.   VG->RCA 100, VG->LCX 100 -->Med Rx.  . CKD (chronic kidney disease), stage III ([place])   . Claudication ([place])    BLE  . Depression   . Diastolic dysfunction    a. 10/2014  Echo: F 60-65%, Gr1 DD, mild AS, mild AI, mild to mod MR, PASP 51mmHg.  [name] Dyspnea 2010   syndrome - extensive  . Erectile dysfunction   . GERD (gastroesophageal reflux disease)   . H/O hiatal hernia   . History of blood transfusion 2014  . Hx of adenomatous colonic polyps 2007  . Hyperlipidemia   . Hypertensive heart disease   . Inguinal hernia   . Iron deficiency anemia   . Shingles 1990's  . Sigmoid diverticulosis   . Upper GI bleeding 2014   Gastritis and possible duodenal ulcer seen on EGD, felt secondary to NSAIDs  . Urethral stricture 11/2008  . Vasovagal syncope 05/2006  . Weight loss     SURGICAL HISTORY: Past Surgical History:  Procedure Laterality Date  . CARDIAC CATHETERIZATION  2007   Left main 100%, RCA 70% ostial, all grafts were patent, EF 40-45 percent, distal circumflex subtotal after graft insertion, medical therapy   . CARDIAC CATHETERIZATION N/A 10/14/2014   Procedure: Left Heart Cath and Cors/Grafts Angiography;  Surgeon: Lorretta Harp, MD;  Location: [facility];  Service: Cardiovascular;  Laterality: N/A;  . CHOLECYSTECTOMY  1980's  . CORONARY ARTERY BYPASS GRAFT  1994    LIMA-LAD, SVG-D1, SVG-OM,  SVG-RCA; performed in [place], [place]  . ESOPHAGOGASTRODUODENOSCOPY N/A 03/25/2013   Procedure: ESOPHAGOGASTRODUODENOSCOPY (EGD);  Surgeon: Missy Sabins, MD;  gastritis, cannot rule out duodenal ulcer   . INGUINAL HERNIA  REPAIR Left   . TRANSURETHRAL RESECTION OF PROSTATE      SOCIAL HISTORY: Social History   Social History  . Marital status: Married    Spouse name: N/A  . Number of children: N/A  . Years of education: N/A   Occupational History  . Retired    Social History Main Topics  . Smoking status: Never Smoker  . Smokeless tobacco: Never Used  . Alcohol use 4.2 oz/week    7 Glasses of wine per week     Comment: wine - daily  . Drug use: No  . Sexual activity: No   Other Topics Concern  . Not on file   Social History  Narrative   He lives in [facility], independent living, with his wife.    FAMILY HISTORY: Family History  Problem Relation Age of Onset  . Hypertension Mother   . Heart disease Mother   . Heart disease Father   . Heart failure Brother   . Heart attack Neg Hx   . Stroke Neg Hx     ALLERGIES:  has No Known Allergies.  MEDICATIONS:  Current Outpatient Prescriptions  Medication Sig Dispense Refill  . acetaminophen (TYLENOL) 325 MG tablet Take 2 tablets (650 mg total) by mouth every 4 (four) hours as needed for headache or mild pain.    [name] albuterol (PROAIR HFA) 108 (90 Base) MCG/ACT inhaler Inhale 2 puffs into the lungs every 6 (six) hours as needed for wheezing or shortness of breath.    [name] aspirin EC 81 MG tablet Take 81 mg by mouth daily as needed for moderate pain.     [name] desvenlafaxine (PRISTIQ) 50 MG 24 hr tablet Take 25 mg by mouth daily.     [name] esomeprazole (NEXIUM) 40 MG capsule Take 40 mg by mouth daily as needed (acid reflux/ heartburn).    . FERROCITE 324 MG TABS tablet TAKE 1 TABLET BY MOUTH QOD  11  . isosorbide mononitrate (IMDUR) 30 MG 24 hr tablet Take 30 mg by mouth daily.    [name] lisinopril (PRINIVIL,ZESTRIL) 10 MG tablet Take 2 tablets (20 mg total) by mouth daily. 60 tablet 0  . LYRICA 50 MG capsule Take 50 mg by mouth daily as needed (pain).     . nitroGLYCERIN (NITROSTAT) 0.4 MG SL tablet Place 1 tablet (0.4 mg total) under the tongue every 5 (five) minutes x 3 doses as needed for chest pain. 25 tablet 4  . Omega-3 Fatty Acids (FISH OIL PO) Take 1 capsule by mouth daily.    . vitamin B-12 (CYANOCOBALAMIN) 1000 MCG tablet Take 1,000 mcg by mouth every evening.      No current facility-administered medications for this visit.     REVIEW OF SYSTEMS:    10 Point review of Systems was done is negative except as noted above.  PHYSICAL EXAMINATION: ECOG PERFORMANCE STATUS: 2 - Symptomatic, <50% confined to bed  . Vitals:   03/12/17 1158  BP: (!) 156/86  Pulse:  (!) 57  Resp: 16  Temp: 98 F (36.7 C)  SpO2: 99%   Filed Weights   03/12/17 1158  Weight: 161 lb 1.6 oz (73.1 kg)   .Body mass index is 26 kg/m.  GENERAL:alert, in no acute distress and comfortable SKIN: no acute rashes, no significant lesions EYES: conjunctiva are pink and non-injected, sclera anicteric OROPHARYNX: MMM, no exudates, no oropharyngeal erythema or ulceration NECK: supple, no JVD LYMPH:  no palpable lymphadenopathy in the cervical, axillary or inguinal regions LUNGS:  clear to auscultation b/l with normal respiratory effort HEART: regular rate & rhythm ABDOMEN:  normoactive bowel sounds , non tender, not distended. Extremity: no pedal edema PSYCH: alert & oriented x 3 with fluent speech NEURO: no focal motor/sensory deficits  LABORATORY DATA:  I have reviewed the data as listed  . CBC Latest Ref Rng & Units 03/03/2017 01/24/2016 01/23/2016  WBC 4.0 - 10.5 K/uL 8.6 7.9 7.7  Hemoglobin 13.0 - 17.0 g/dL 12.9(L) 12.9(L) 13.5  Hematocrit 39.0 - 52.0 % 40.3 40.2 42.0  Platelets 150 - 400 K/uL 173 139(L) 133(L)    . CMP Latest Ref Rng & Units 03/03/2017 01/24/2016 01/23/2016  Glucose 65 - 99 mg/dL 95 99 98  BUN 6 - 20 mg/dL 41(H) 25(H) 23(H)  Creatinine 0.61 - 1.24 mg/dL 2.10(H) 1.69(H) 1.63(H)  Sodium 135 - 145 mmol/L 137 140 141  Potassium 3.5 - 5.1 mmol/L 5.3(H) 4.0 4.6  Chloride 101 - 111 mmol/L 107 105 110  CO2 22 - 32 mmol/L 23 22 24   Calcium 8.9 - 10.3 mg/dL 9.1 8.8(L) 9.2  Total Protein 6.5 - 8.1 g/dL 6.8 - -  Total Bilirubin 0.3 - 1.2 mg/dL 0.5 - -  Alkaline Phos 38 - 126 U/L 57 - -  AST 15 - 41 U/L 16 - -  ALT 17 - 63 U/L 14(L) - -    RADIOGRAPHIC STUDIES: I have personally reviewed the radiological images as listed and agreed with the findings in the report. Dg Chest 2 View  Result Date: 03/03/2017 CLINICAL DATA:  Shortness of breath EXAM: CHEST  2 VIEW COMPARISON:  03/03/2017, 01/22/2016 FINDINGS: Post sternotomy changes. Right lung shows mild  basilar atelectasis. Moderate left pleural effusion, stable to slight increase in size compared to prior. Dense airspace disease in the lingula and left base. Slightly enlarged cardiomediastinal silhouette. No pneumothorax. Degenerative changes of the spine. IMPRESSION: 1. Slight increased size of a moderate left pleural effusion with basilar atelectasis or pneumonia 2. Probable subsegmental atelectasis within the medial right lung base. Electronically Signed   By: Donavan Foil M.D.   On: 03/03/2017 17:14   Ct Chest Wo Contrast  Result Date: 03/03/2017 CLINICAL DATA:  Shortness of breath with pleural effusion. EXAM: CT CHEST WITHOUT CONTRAST TECHNIQUE: Multidetector CT imaging of the chest was performed following the standard protocol without IV contrast. COMPARISON:  Chest x-ray 03/03/2017 FINDINGS: Cardiovascular: The heart size is normal. No pericardial effusion. Patient is status post CABG Atherosclerotic calcification is noted in the wall of the thoracic aorta. Mediastinum/Nodes: No mediastinal lymphadenopathy. No evidence for gross hilar lymphadenopathy although assessment is limited by the lack of intravenous contrast on today's study. Calcified nodal tissue is seen in the left hilum. Large hiatal hernia evident with organoaxial volvulus of the stomach. No evidence for gastric obstruction. No axillary lymphadenopathy. Lungs/Pleura: 2.4 x 3.6 cm masslike lesion is identified in the posterior left upper lobe, along the major fissure. This is associated with areas of interlobular septal thickening in the left upper lobe. Small left pleural effusion is associated. Multiple scattered pulmonary nodules are evident, more apparent on the right than the left but some of these are calcified. Upper Abdomen: Calcified granulomata noted in the spleen. 19 mm right adrenal nodule has average attenuation most suggestive of benign adrenal adenoma. Musculoskeletal: Bone windows reveal no worrisome lytic or sclerotic  osseous lesions. IMPRESSION: 1. 2.4 x 3.6 cm apparent mass in the posterior left upper lobe with associated left upper lobe interlobular septal thickening. Imaging features concerning  for neoplasm with lymphangitic tumor spread. 2. Small left pleural effusion. 3. Large hiatal hernia contains nearly the entire stomach with evidence of organoaxial volvulus. No evidence for gastric obstruction. 4. 19 mm right adrenal adenoma. Electronically Signed   By: Misty Stanley M.D.   On: 03/03/2017 21:00    ASSESSMENT & PLAN:   KYSHON TOLLIVER is a wonderful 83 y.o. male with left upper lobe mass.  1) Newly noted LUL lung mass concerning for malignant neoplasm of upper lobe of left lung   CT chest Wo contrast on 03/03/2017:IMPRESSION: 1. 2.4 x 3.6 cm apparent mass in the posterior left upper lobe with associated left upper lobe interlobular septal thickening. Imaging features concerning for neoplasm with lymphangitic tumor spread. 2. Small left pleural effusion. 3. Large hiatal hernia contains nearly the entire stomach with evidence of organoaxial volvulus. No evidence for gastric obstruction. 4. 19 mm right adrenal adenoma.  PLAN  -I discussed in detail with pt and his family results of CT scan and diagnostic possibilities. -PET/CT for intiial staging for likely lung cancerand to direct diagnostic w/u and treatment approach. -if localized Stage I disease will need to consider possible SBRT w or without risk of Bx of LUL lung lesion . (would not be a good surgical candidate  Given age and medical co-morbidities. -if most extensive disease will plan to biopsy alternative site if possible to minimize bx related complications. -broadly discussed treatment approach based on stage of disease and his age/medical co-morbidities/performance status. -PET scan in 3-5 days -will use this for diagnostic and treatment strategy. Will decide on biopsy vs radiation oncology  for SRS if localized -If there is sig. effusion  will consider tapping for diagnosing    RTC with Dr Irene Limbo in 10-12 days with PET/CT results  All of the patients questions were answered with apparent satisfaction. The patient knows to call the clinic with any problems, questions or concerns.  I spent 45 minutes counseling the patient face to face. The total time spent in the appointment was 60 minutes and more than 50% was on counseling and direct patient cares.  Sullivan Lone MD [place] AAHIVMS [facility] Hematology/Oncology Physician [facility]  (Office):       [phone] (Work cell):  [phone] (Fax):           [phone]  03/12/2017 12:53 PM    This document serves as a record of services personally performed by Sullivan Lone, MD. It was created on her behalf by Alean Rinne, a trained medical scribe. The creation of this record is based on the scribe's personal observations and the provider's statements to them. This document has been checked and approved by the attending provider.

## 2017-03-17 ENCOUNTER — Encounter (HOSPITAL_COMMUNITY)
Admission: RE | Admit: 2017-03-17 | Discharge: 2017-03-17 | Disposition: A | Discharge status: Home / Self Care | Payer: Medicare Other | Source: Ambulatory Visit | Attending: Hematology | Admitting: Hematology

## 2017-03-17 DIAGNOSIS — C3490 Malignant neoplasm of unspecified part of unspecified bronchus or lung: Secondary | ICD-10-CM | POA: Diagnosis not present

## 2017-03-17 LAB — GLUCOSE, CAPILLARY: GLUCOSE-CAPILLARY: 89 mg/dL (ref 65–99)

## 2017-03-17 MED ORDER — FLUDEOXYGLUCOSE F - 18 (FDG) INJECTION
7.9900 | Freq: Once | INTRAVENOUS | Status: AC | PRN
  Administered 2017-03-17: 7.99 via INTRAVENOUS

## 2017-03-20 NOTE — Progress Notes (Signed)
HEMATOLOGY/ONCOLOGY CONSULTATION NOTE  Date of Service: 03/21/2017  Patient Care Team: Josetta Huddle, MD as PCP - General (Internal Medicine)  CHIEF COMPLAINTS/PURPOSE OF CONSULTATION:  Left upper lobe lung mass  HISTORY OF PRESENTING ILLNESS:   Scott [name] is a wonderful 83 y.o. male who has been referred to [place] by Dr. Josetta Huddle, MD for evaluation of suspected lung cancer.   He has a hx of CAD, CKD, Diastolic CHF, HTN, HLD. He presented to the ED on 03/03/2017 for SOB and was found to have pleural effusion. He was also to have a mass in the upper lobe of left lung after a CT chest Wo contrast with results showing:IMPRESSION:1. 2.4 x 3.6 cm apparent mass in the posterior left upper lobe with associated left upper lobe interlobular septal thickening. Imaging features concerning for neoplasm with lymphangitic tumor spread. 2. Small left pleural effusion. 3. Large hiatal hernia contains nearly the entire stomach with evidence of organoaxial volvulus. No evidence for gastric obstruction. 4. 19 mm right adrenal adenoma.  He is accompanied by his wife and daughter. He states that he began feeling SOB 2 weeks ago. He is not a smoker and has not smoked in the past. He states that he has not been evaluated by his cardiologist, Dr. Larae Grooms. For his recent SOB.  On review of systems, patient reports fatigue, intermittent SOB, and denies fever, chills, CP, cough, weight loss, and any other accompanying symptoms.  INTERVAL HISTORY:   Pt presents to the office today for follow up and to discuss PET scan results. He is accompanied by his wife and two daughters. He reports that he is doing well overall. Wife reports that he is able to continue with his daily activities and was very active last week. He lives in [place] with his wife.   Since his last visit to the office, he has had a PET scan on 03/17/2017 with results showing: Left upper lobe lung mass is hypermetabolic and  consistent with primary lung neoplasm. There is also metastatic left hilar adenopathy. Moderate to large left pleural effusion with overlying atelectasis but no obvious hypermetabolic pleural nodules. No findings for metastatic disease involving the neck, abdomen/pelvis or osseous structures. We discussed in details several approaches to further evaluation and management. On review of systems, pt reports mild exertional SOB and denies SOB at rest. He denies CP. He denies any other symptoms at this time.     MEDICAL HISTORY:  Past Medical History:  Diagnosis Date  . Anxiety   . Bradycardia    a. BB d/c'd in 2016.  [name] CAD (coronary artery disease)    a. 1983 s/p CABG [facility], [place]);  b. 2007 NSTEMI->med Rx for distal LCX dzs; c. 09/2014 NSTEMI/Cath: LIMA->LAD & diag ok, native RCA dominant and patent.   VG->RCA 100, VG->LCX 100 -->Med Rx.  . CKD (chronic kidney disease), stage III ([place])   . Claudication ([place])    BLE  . Depression   . Diastolic dysfunction    a. 10/2014 Echo: F 60-65%, Gr1 DD, mild AS, mild AI, mild to mod MR, PASP 33mmHg.  [name] Dyspnea 2010   syndrome - extensive  . Erectile dysfunction   . GERD (gastroesophageal reflux disease)   . H/O hiatal hernia   . History of blood transfusion 2014  . Hx of adenomatous colonic polyps 2007  . Hyperlipidemia   . Hypertensive heart disease   . Inguinal hernia   . Iron deficiency anemia   .  Shingles 1990's  . Sigmoid diverticulosis   . Upper GI bleeding 2014   Gastritis and possible duodenal ulcer seen on EGD, felt secondary to NSAIDs  . Urethral stricture 11/2008  . Vasovagal syncope 05/2006  . Weight loss     SURGICAL HISTORY: Past Surgical History:  Procedure Laterality Date  . CARDIAC CATHETERIZATION  2007   Left main 100%, RCA 70% ostial, all grafts were patent, EF 40-45 percent, distal circumflex subtotal after graft insertion, medical therapy   . CARDIAC CATHETERIZATION N/A 10/14/2014   Procedure: Left Heart Cath and  Cors/Grafts Angiography;  Surgeon: Lorretta Harp, MD;  Location: [facility];  Service: Cardiovascular;  Laterality: N/A;  . CHOLECYSTECTOMY  1980's  . CORONARY ARTERY BYPASS GRAFT  1994    LIMA-LAD, SVG-D1, SVG-OM,  SVG-RCA; performed in [place], [place]  . ESOPHAGOGASTRODUODENOSCOPY N/A 03/25/2013   Procedure: ESOPHAGOGASTRODUODENOSCOPY (EGD);  Surgeon: Missy Sabins, MD;  gastritis, cannot rule out duodenal ulcer   . INGUINAL HERNIA REPAIR Left   . TRANSURETHRAL RESECTION OF PROSTATE      SOCIAL HISTORY: Social History   Social History  . Marital status: Married    Spouse name: N/A  . Number of children: N/A  . Years of education: N/A   Occupational History  . Retired    Social History Main Topics  . Smoking status: Never Smoker  . Smokeless tobacco: Never Used  . Alcohol use 4.2 oz/week    7 Glasses of wine per week     Comment: wine - daily  . Drug use: No  . Sexual activity: No   Other Topics Concern  . Not on file   Social History Narrative   He lives in [facility], independent living, with his wife.    FAMILY HISTORY: Family History  Problem Relation Age of Onset  . Hypertension Mother   . Heart disease Mother   . Heart disease Father   . Heart failure Brother   . Heart attack Neg Hx   . Stroke Neg Hx     ALLERGIES:  has No Known Allergies.  MEDICATIONS:  Current Outpatient Prescriptions  Medication Sig Dispense Refill  . acetaminophen (TYLENOL) 325 MG tablet Take 2 tablets (650 mg total) by mouth every 4 (four) hours as needed for headache or mild pain.    [name] albuterol (PROAIR HFA) 108 (90 Base) MCG/ACT inhaler Inhale 2 puffs into the lungs every 6 (six) hours as needed for wheezing or shortness of breath.    [name] aspirin EC 81 MG tablet Take 81 mg by mouth daily as needed for moderate pain.     [name] desvenlafaxine (PRISTIQ) 50 MG 24 hr tablet Take 25 mg by mouth daily.     [name] esomeprazole (NEXIUM) 40 MG capsule Take 40 mg by mouth daily as  needed (acid reflux/ heartburn).    . FERROCITE 324 MG TABS tablet TAKE 1 TABLET BY MOUTH QOD  11  . isosorbide mononitrate (IMDUR) 30 MG 24 hr tablet Take 30 mg by mouth daily.    [name] lisinopril (PRINIVIL,ZESTRIL) 10 MG tablet Take 2 tablets (20 mg total) by mouth daily. 60 tablet 0  . LYRICA 50 MG capsule Take 50 mg by mouth daily as needed (pain).     . nitroGLYCERIN (NITROSTAT) 0.4 MG SL tablet Place 1 tablet (0.4 mg total) under the tongue every 5 (five) minutes x 3 doses as needed for chest pain. 25 tablet 4  . Omega-3 Fatty Acids (FISH OIL PO) Take  1 capsule by mouth daily.    . vitamin B-12 (CYANOCOBALAMIN) 1000 MCG tablet Take 1,000 mcg by mouth every evening.      No current facility-administered medications for this visit.     REVIEW OF SYSTEMS:    10 Point review of Systems was done is negative except as noted above.  PHYSICAL EXAMINATION:  ECOG PERFORMANCE STATUS: 2 - Symptomatic, <50% confined to bed  . Vitals:   03/21/17 1141  BP: (!) 179/70  Pulse: (!) 59  Resp: 18  Temp: 98 F (36.7 C)  SpO2: 100%   Filed Weights   03/21/17 1141  Weight: 159 lb 8 oz (72.3 kg)   .Body mass index is 25.74 kg/m.  GENERAL:alert, in no acute distress and comfortable SKIN: no acute rashes, no significant lesions EYES: conjunctiva are pink and non-injected, sclera anicteric OROPHARYNX: MMM, no exudates, no oropharyngeal erythema or ulceration NECK: supple, no JVD LYMPH:  no palpable lymphadenopathy in the cervical, axillary or inguinal regions LUNGS: clear to auscultation b/l with normal respiratory effort HEART: regular rate & rhythm ABDOMEN:  normoactive bowel sounds , non tender, not distended. Extremity: no pedal edema PSYCH: alert & oriented x 3 with fluent speech NEURO: no focal motor/sensory deficits  LABORATORY DATA:  I have reviewed the data as listed  . CBC Latest Ref Rng & Units 03/03/2017 01/24/2016 01/23/2016  WBC 4.0 - 10.5 K/uL 8.6 7.9 7.7  Hemoglobin 13.0 -  17.0 g/dL 12.9(L) 12.9(L) 13.5  Hematocrit 39.0 - 52.0 % 40.3 40.2 42.0  Platelets 150 - 400 K/uL 173 139(L) 133(L)    . CMP Latest Ref Rng & Units 03/03/2017 01/24/2016 01/23/2016  Glucose 65 - 99 mg/dL 95 99 98  BUN 6 - 20 mg/dL 41(H) 25(H) 23(H)  Creatinine 0.61 - 1.24 mg/dL 2.10(H) 1.69(H) 1.63(H)  Sodium 135 - 145 mmol/L 137 140 141  Potassium 3.5 - 5.1 mmol/L 5.3(H) 4.0 4.6  Chloride 101 - 111 mmol/L 107 105 110  CO2 22 - 32 mmol/L 23 22 24   Calcium 8.9 - 10.3 mg/dL 9.1 8.8(L) 9.2  Total Protein 6.5 - 8.1 g/dL 6.8 - -  Total Bilirubin 0.3 - 1.2 mg/dL 0.5 - -  Alkaline Phos 38 - 126 U/L 57 - -  AST 15 - 41 U/L 16 - -  ALT 17 - 63 U/L 14(L) - -    RADIOGRAPHIC STUDIES: I have personally reviewed the radiological images as listed and agreed with the findings in the report. Dg Chest 2 View  Result Date: 03/03/2017 CLINICAL DATA:  Shortness of breath EXAM: CHEST  2 VIEW COMPARISON:  03/03/2017, 01/22/2016 FINDINGS: Post sternotomy changes. Right lung shows mild basilar atelectasis. Moderate left pleural effusion, stable to slight increase in size compared to prior. Dense airspace disease in the lingula and left base. Slightly enlarged cardiomediastinal silhouette. No pneumothorax. Degenerative changes of the spine. IMPRESSION: 1. Slight increased size of a moderate left pleural effusion with basilar atelectasis or pneumonia 2. Probable subsegmental atelectasis within the medial right lung base. Electronically Signed   By: Donavan Foil M.D.   On: 03/03/2017 17:14   Ct Chest Wo Contrast  Result Date: 03/03/2017 CLINICAL DATA:  Shortness of breath with pleural effusion. EXAM: CT CHEST WITHOUT CONTRAST TECHNIQUE: Multidetector CT imaging of the chest was performed following the standard protocol without IV contrast. COMPARISON:  Chest x-ray 03/03/2017 FINDINGS: Cardiovascular: The heart size is normal. No pericardial effusion. Patient is status post CABG Atherosclerotic calcification is  noted in the wall  of the thoracic aorta. Mediastinum/Nodes: No mediastinal lymphadenopathy. No evidence for gross hilar lymphadenopathy although assessment is limited by the lack of intravenous contrast on today's study. Calcified nodal tissue is seen in the left hilum. Large hiatal hernia evident with organoaxial volvulus of the stomach. No evidence for gastric obstruction. No axillary lymphadenopathy. Lungs/Pleura: 2.4 x 3.6 cm masslike lesion is identified in the posterior left upper lobe, along the major fissure. This is associated with areas of interlobular septal thickening in the left upper lobe. Small left pleural effusion is associated. Multiple scattered pulmonary nodules are evident, more apparent on the right than the left but some of these are calcified. Upper Abdomen: Calcified granulomata noted in the spleen. 19 mm right adrenal nodule has average attenuation most suggestive of benign adrenal adenoma. Musculoskeletal: Bone windows reveal no worrisome lytic or sclerotic osseous lesions. IMPRESSION: 1. 2.4 x 3.6 cm apparent mass in the posterior left upper lobe with associated left upper lobe interlobular septal thickening. Imaging features concerning for neoplasm with lymphangitic tumor spread. 2. Small left pleural effusion. 3. Large hiatal hernia contains nearly the entire stomach with evidence of organoaxial volvulus. No evidence for gastric obstruction. 4. 19 mm right adrenal adenoma. Electronically Signed   By: Misty Stanley M.D.   On: 03/03/2017 21:00   Nm Pet Image Initial (pi) Skull Base To Thigh  Result Date: 03/17/2017 CLINICAL DATA:  Initial treatment strategy for left upper lobe lung lesion. EXAM: NUCLEAR MEDICINE PET SKULL BASE TO THIGH TECHNIQUE: 7.99 mCi F-18 FDG was injected intravenously. Full-ring PET imaging was performed from the skull base to thigh after the radiotracer. CT data was obtained and used for attenuation correction and anatomic localization. FASTING BLOOD GLUCOSE:   Value: 89 mg/dl COMPARISON:  Chest CT 03/03/2017. FINDINGS: NECK: No hypermetabolic lymph nodes in the neck. No neck mass. Asymmetric FDG activity is noted in the right longus coli muscle. This is not pathologic. CHEST: Left upper lobe lung mass is hypermetabolic with SUV max of 81.82. There are also small hypermetabolic left hilar lymph nodes measuring 9 mm with SUV max of 8.4. No other lung lesions are identified. There is a large left pleural effusion but no obvious hypermetabolic pleural nodules. Stable tortuosity, ectasia and calcification of the thoracic aorta along with dense three-vessel coronary artery calcifications and surgical changes from bypass surgery. Numerous calcified mediastinal and hilar lymph nodes are noted. ABDOMEN/PELVIS: No abnormal hypermetabolic activity within the liver, pancreas, adrenal glands, or spleen. No hypermetabolic lymph nodes in the abdomen or pelvis. Advanced atherosclerotic calcifications involving the abdominal aorta and branch vessels. Large right inguinal hernia containing small bowel loops without findings for incarceration or obstruction. TURP defect involving enlarged prostate gland. SKELETON: No focal hypermetabolic activity to suggest skeletal metastasis. IMPRESSION: 1. Left upper lobe lung mass is hypermetabolic and consistent with primary lung neoplasm. There is also metastatic left hilar adenopathy. 2. Moderate to large left pleural effusion with overlying atelectasis but no obvious hypermetabolic pleural nodules. 3. No findings for metastatic disease involving the neck, abdomen/pelvis or osseous structures. Electronically Signed   By: Marijo Sanes M.D.   On: 03/17/2017 10:40    ASSESSMENT & PLAN:   Scott [name] is a wonderful 83 y.o. male with left upper lobe mass.  1) Newly noted LUL lung mass concerning for malignant neoplasm of upper lobe of left lung with associated moderated left pleural effusion and left hilar LNadenopathy  CT chest Wo  contrast on 03/03/2017: IMPRESSION: 1. 2.4 x 3.6 cm apparent  mass in the posterior left upper lobe with associated left upper lobe interlobular septal thickening. Imaging features concerning for neoplasm with lymphangitic tumor spread. 2. Small left pleural effusion. 3. Large hiatal hernia contains nearly the entire stomach with evidence of organoaxial volvulus. No evidence for gastric obstruction. 4. 19 mm right adrenal adenoma. -PET scan on 03/17/2017 with results showing: Left upper lobe lung mass is hypermetabolic and consistent with primary lung neoplasm. There is also metastatic left hilar adenopathy. Moderate to large left pleural effusion with overlying atelectasis but no obvious hypermetabolic pleural nodules. No findings for metastatic disease involving the neck, abdomen/pelvis or osseous structures.  PLAN -I discussed in detail with pt and his family results of PET scan and diagnostic possibilities. -MRI brain to complete staging -US guided left pleural thoracentesis for diagnostic and staging purposes. -f thoracentesis -not diagnostic might need CT guided biopsy of LUL lung mass if safety possible. -patient and his family agreed to have fluid aspiration of his pleural effusion.  -if most extensive disease will plan to biopsy alternative site if possible to minimize bx related complications. -broadly discussed treatment approach based on stage of disease and his age/medical co-morbidities/performance status. -Will give the patient a referral to Radiation Oncology to consider SBRT /palliative to LUL lung mass -- esp if stagin shows only localized disease. -discussed goals of care in details.  US guided left pleural thoracentesis diagnostic ? Malignant in 3-5 days.  MRI brain w/o contrast in 5 days Radiation oncology referral to consider SRS to left upper lobe lung mass in 2 weeks if localized disease. RTC with Dr. Irene Limbo in 2 weeks.    All of the patients questions were answered with  apparent satisfaction. The patient knows to call the clinic with any problems, questions or concerns.  I spent 30 minutes counseling the patient face to face. The total time spent in the appointment was 40 minutes and more than 50% was on counseling and direct patient cares.  .I have reviewed the above documentation for accuracy and completeness, and I agree with the above.  Sullivan Lone MD [place] AAHIVMS [facility] Hematology/Oncology Physician [facility]  (Office):       [phone] (Work cell):  [phone] (Fax):           [phone]  03/21/2017 12:53 PM    This document serves as a record of services personally performed by Sullivan Lone, MD. It was created on his behalf by Steva Colder, a trained medical scribe. The creation of this record is based on the scribe's personal observations and the provider's statements to them. This document has been checked and approved by the attending provider.

## 2017-03-21 ENCOUNTER — Encounter: Payer: Self-pay | Admitting: Radiation Oncology

## 2017-03-21 ENCOUNTER — Ambulatory Visit (HOSPITAL_BASED_OUTPATIENT_CLINIC_OR_DEPARTMENT_OTHER): Payer: Medicare Other | Admitting: Hematology

## 2017-03-21 ENCOUNTER — Telehealth: Payer: Self-pay

## 2017-03-21 ENCOUNTER — Encounter: Payer: Self-pay | Admitting: Hematology

## 2017-03-21 VITALS — BP 179/70 | HR 59 | Temp 98.0°F | Resp 18 | Ht 66.0 in | Wt 159.5 lb

## 2017-03-21 DIAGNOSIS — C3490 Malignant neoplasm of unspecified part of unspecified bronchus or lung: Secondary | ICD-10-CM

## 2017-03-21 DIAGNOSIS — J90 Pleural effusion, not elsewhere classified: Secondary | ICD-10-CM

## 2017-03-21 DIAGNOSIS — C3412 Malignant neoplasm of upper lobe, left bronchus or lung: Secondary | ICD-10-CM

## 2017-03-21 DIAGNOSIS — I25810 Atherosclerosis of coronary artery bypass graft(s) without angina pectoris: Secondary | ICD-10-CM

## 2017-03-21 DIAGNOSIS — R918 Other nonspecific abnormal finding of lung field: Secondary | ICD-10-CM | POA: Diagnosis not present

## 2017-03-21 NOTE — Patient Instructions (Signed)
Thank you for choosing [facility] to provide your oncology and hematology care.  To afford each patient quality time with our providers, please arrive 30 minutes before your scheduled appointment time.  If you arrive late for your appointment, you may be asked to reschedule.  We strive to give you quality time with our providers, and arriving late affects you and other patients whose appointments are after yours.   If you are a no show for multiple scheduled visits, you may be dismissed from the clinic at the providers discretion.    Again, thank you for choosing [facility], our hope is that these requests will decrease the amount of time that you wait before being seen by our physicians.  ______________________________________________________________________  Should you have questions after your visit to the [facility], please contact our office at [phone] between the hours of 8:30 and 4:30 p.m.    Voicemails left after 4:30p.m will not be returned until the following business day.    For prescription refill requests, please have your pharmacy contact us directly.  Please also try to allow 48 hours for prescription requests.    Please contact the scheduling department for questions regarding scheduling.  For scheduling of procedures such as PET scans, CT scans, MRI, Ultrasound, etc please contact central scheduling at [phone].    Resources For Cancer Patients and Caregivers:   Oncolink.org:  A wonderful resource for patients and healthcare providers for information regarding your disease, ways to tract your treatment, what to expect, etc.     American Cancer Society:  800-227-2345  Can help patients locate various types of support and financial assistance  Cancer Care: 1-800-813-HOPE (4673) Provides financial assistance, online support groups, medication/co-pay assistance.    [place] DSS:  336-641-3447 Where to apply for food  stamps, Medicaid, and utility assistance  Medicare Rights Center: 800-333-4114 Helps people with Medicare understand their rights and benefits, navigate the Medicare system, and secure the quality healthcare they deserve  SCAT: 336-333-6589 [place] [employer]'s shared-ride transportation service for eligible riders who have a disability that prevents them from riding the fixed route bus.    For additional information on assistance programs please contact our social worker:   Grier Hock/Abigail Elmore:  [phone]            

## 2017-03-21 NOTE — Telephone Encounter (Signed)
Printed avs and calender for upcoming appointment, also called and scheduled ordered appointments. Per 11/2 los

## 2017-03-24 ENCOUNTER — Ambulatory Visit (HOSPITAL_COMMUNITY)
Admission: RE | Admit: 2017-03-24 | Discharge: 2017-03-24 | Disposition: A | Discharge status: Home / Self Care | Payer: Medicare Other | Source: Ambulatory Visit | Attending: Hematology | Admitting: Hematology

## 2017-03-24 DIAGNOSIS — C3490 Malignant neoplasm of unspecified part of unspecified bronchus or lung: Secondary | ICD-10-CM | POA: Diagnosis not present

## 2017-03-24 DIAGNOSIS — R51 Headache: Secondary | ICD-10-CM | POA: Diagnosis not present

## 2017-03-24 DIAGNOSIS — G319 Degenerative disease of nervous system, unspecified: Secondary | ICD-10-CM | POA: Insufficient documentation

## 2017-03-28 ENCOUNTER — Ambulatory Visit (HOSPITAL_COMMUNITY)
Admission: RE | Admit: 2017-03-28 | Discharge: 2017-03-28 | Disposition: A | Discharge status: Home / Self Care | Payer: Medicare Other | Source: Ambulatory Visit | Attending: Hematology | Admitting: Hematology

## 2017-03-28 ENCOUNTER — Ambulatory Visit (HOSPITAL_COMMUNITY)
Admission: RE | Admit: 2017-03-28 | Discharge: 2017-03-28 | Disposition: A | Discharge status: Home / Self Care | Payer: Medicare Other | Source: Ambulatory Visit | Attending: General Surgery | Admitting: General Surgery

## 2017-03-28 DIAGNOSIS — I517 Cardiomegaly: Secondary | ICD-10-CM | POA: Diagnosis not present

## 2017-03-28 DIAGNOSIS — C3412 Malignant neoplasm of upper lobe, left bronchus or lung: Secondary | ICD-10-CM | POA: Diagnosis not present

## 2017-03-28 DIAGNOSIS — Z9889 Other specified postprocedural states: Secondary | ICD-10-CM | POA: Diagnosis not present

## 2017-03-28 DIAGNOSIS — J910 Malignant pleural effusion: Secondary | ICD-10-CM | POA: Diagnosis not present

## 2017-03-28 DIAGNOSIS — J90 Pleural effusion, not elsewhere classified: Secondary | ICD-10-CM | POA: Diagnosis not present

## 2017-03-28 DIAGNOSIS — C384 Malignant neoplasm of pleura: Secondary | ICD-10-CM | POA: Insufficient documentation

## 2017-03-28 LAB — LACTATE DEHYDROGENASE, PLEURAL OR PERITONEAL FLUID: LD FL: 112 U/L — AB (ref 3–23)

## 2017-03-28 LAB — BODY FLUID CELL COUNT WITH DIFFERENTIAL
Lymphs, Fluid: 85 %
MONOCYTE-MACROPHAGE-SEROUS FLUID: 11 % — AB (ref 50–90)
Neutrophil Count, Fluid: 5 % (ref 0–25)
WBC FLUID: 1877 uL — AB (ref 0–1000)

## 2017-03-28 LAB — GLUCOSE, PLEURAL OR PERITONEAL FLUID: Glucose, Fluid: 91 mg/dL

## 2017-03-28 LAB — PROTEIN, PLEURAL OR PERITONEAL FLUID: TOTAL PROTEIN, FLUID: 4 g/dL

## 2017-03-28 MED ORDER — LIDOCAINE HCL 2 % IJ SOLN
INTRAMUSCULAR | Status: AC
  Filled 2017-03-28: qty 10

## 2017-03-28 NOTE — Procedures (Signed)
Ultrasound-guided diagnostic and therapeutic left thoracentesis performed yielding 1 liters of serous colored fluid. No immediate complications. Follow-up chest x-ray pending.      Scott [name] E 10:42 AM 03/28/2017

## 2017-03-31 LAB — BODY FLUID CULTURE: CULTURE: NO GROWTH

## 2017-03-31 LAB — PH, BODY FLUID: pH, Body Fluid: 7.5

## 2017-04-04 ENCOUNTER — Telehealth: Payer: Self-pay | Admitting: *Deleted

## 2017-04-04 ENCOUNTER — Encounter: Payer: Self-pay | Admitting: Hematology

## 2017-04-04 ENCOUNTER — Telehealth: Payer: Self-pay | Admitting: Hematology

## 2017-04-04 ENCOUNTER — Ambulatory Visit (HOSPITAL_BASED_OUTPATIENT_CLINIC_OR_DEPARTMENT_OTHER): Payer: Medicare Other | Admitting: Hematology

## 2017-04-04 VITALS — BP 145/62 | HR 66 | Temp 97.9°F | Resp 17 | Ht 66.0 in | Wt 157.3 lb

## 2017-04-04 DIAGNOSIS — I25810 Atherosclerosis of coronary artery bypass graft(s) without angina pectoris: Secondary | ICD-10-CM | POA: Diagnosis not present

## 2017-04-04 DIAGNOSIS — C3492 Malignant neoplasm of unspecified part of left bronchus or lung: Secondary | ICD-10-CM

## 2017-04-04 DIAGNOSIS — J910 Malignant pleural effusion: Secondary | ICD-10-CM | POA: Diagnosis not present

## 2017-04-04 DIAGNOSIS — C3412 Malignant neoplasm of upper lobe, left bronchus or lung: Secondary | ICD-10-CM | POA: Diagnosis not present

## 2017-04-04 DIAGNOSIS — N189 Chronic kidney disease, unspecified: Secondary | ICD-10-CM | POA: Diagnosis not present

## 2017-04-04 NOTE — Progress Notes (Signed)
Thoracic Location of Tumor / Histology: Left upper lobe lung mass  Patient presented to the ED on 03/03/2017 for SOB and could only walk short distances and was found to have pleural effusion.  Biopsies revealed:   03/28/17 Diagnosis PLEURAL FLUID, LEFT (SPECIMEN 1 OF 1 COLLECTED 03/28/17): MALIGNANT CELLS CONSISTENT WITH METASTATIC ADENOCARCINOMA.  Tobacco/Marijuana/Snuff/ETOH use: patient has never smoked.  Drinks a glass of wine daily - currently not drinking.  Past/Anticipated interventions by cardiothoracic surgery, if any: yes  Past/Anticipated interventions by medical oncology, if any: sample sent for PDL1 testing Per Dr. Irene Limbo - if mutation present would recommended targeted therapy -if targetable mutations not present would need to consider palliative systemic immunotherapy vs Best support cares.   Signs/Symptoms  Weight changes, if any: no  Respiratory complaints, if any: yes - has a dry cough, shortness of breath with activity  Hemoptysis, if any: no Pain issues, if any:  Yes - has occasional left chest pain since the thoracenteses.   SAFETY ISSUES:  Prior radiation? no  Pacemaker/ICD? no   Possible current pregnancy?no  Is the patient on methotrexate? no  Current Complaints / other details:  MRI of brain done 11/6.  Patient is here with his wife and daughter.  BP (!) 133/103 (BP Location: Left Arm, Patient Position: Sitting)   Pulse 63   Temp 97.9 F (36.6 C) (Oral)   Ht 5' 6"  (1.676 m)   Wt 158 lb 3.2 oz (71.8 kg)   SpO2 98%   BMI 25.53 kg/m    Wt Readings from Last 3 Encounters:  04/07/17 158 lb 3.2 oz (71.8 kg)  04/04/17 157 lb 4.8 oz (71.4 kg)  03/24/17 159 lb (72.1 kg)

## 2017-04-04 NOTE — Telephone Encounter (Signed)
Gave patient AVS. Patient declined calendar. Patient scheduled per 11/16 los.

## 2017-04-04 NOTE — Telephone Encounter (Signed)
Per Dr. Irene Limbo, pathology contacted to add PDL-1 testing to foundation one results.  Per Suanne Marker in pathology, OK to add.

## 2017-04-04 NOTE — Progress Notes (Signed)
HEMATOLOGY/ONCOLOGY CLINIC NOTE  Date of Service: 04/04/2017  Patient Care Team: Scott Huddle, MD as PCP - General (Internal Medicine)  CHIEF COMPLAINTS/PURPOSE OF CONSULTATION:  Left upper lobe lung mass  HISTORY OF PRESENTING ILLNESS:   Scott [name] is a wonderful 83 y.o. male who has been referred to [place] by Dr. Josetta Huddle, MD for evaluation of suspected lung cancer.   He has a hx of CAD, CKD, Diastolic CHF, HTN, HLD. He presented to the ED on 03/03/2017 for SOB and was found to have pleural effusion. He was also to have a mass in the upper lobe of left lung after a CT chest Wo contrast with results showing:IMPRESSION:1. 2.4 x 3.6 cm apparent mass in the posterior left upper lobe with associated left upper lobe interlobular septal thickening. Imaging features concerning for neoplasm with lymphangitic tumor spread. 2. Small left pleural effusion. 3. Large hiatal hernia contains nearly the entire stomach with evidence of organoaxial volvulus. No evidence for gastric obstruction. 4. 19 mm right adrenal adenoma.  He is accompanied by his wife and daughter. He states that he began feeling SOB 2 weeks ago. He is not a smoker and has not smoked in the past. He states that he has not been evaluated by his cardiologist, Dr. Larae Grooms. For his recent SOB.  On review of systems, patient reports fatigue, intermittent SOB, and denies fever, chills, CP, cough, weight loss, and any other accompanying symptoms.  INTERVAL HISTORY:   Pt presents to the office today for follow up  to discuss the results of his recent brain MRI and after his thoracentesis. He is accompanied by his wife and daughter. He reports that he is doing well overall in the interim. The MRI of the brain was negative for concerns of spread of metastatic disease. Unfortunately, the fluid from his thoracentesis did return with malignant cells consistent with metastatic adenocarcinoma of the lung. He has been going well  overall otherwise, and without acute complaints. He has not had any prohibitive issues with chest pain or shortness of breath.   On review of systems, pt denies fever, chills, rash, mouth sores, weight loss, decreased appetite, urinary complaints. Denies pain. Pt denies abdominal pain, nausea, vomiting.     MEDICAL HISTORY:  Past Medical History:  Diagnosis Date  . Anxiety   . Bradycardia    a. BB d/c'd in 2016.  [name] CAD (coronary artery disease)    a. 1983 s/p CABG [facility] [place]);  b. 2007 NSTEMI->med Rx for distal LCX dzs; c. 09/2014 NSTEMI/Cath: LIMA->LAD & diag ok, native RCA dominant and patent.   VG->RCA 100, VG->LCX 100 -->Med Rx.  . CKD (chronic kidney disease), stage III ([place])   . Claudication ([place])    BLE  . Depression   . Diastolic dysfunction    a. 10/2014 Echo: F 60-65%, Gr1 DD, mild AS, mild AI, mild to mod MR, PASP 63mHg.  .Marland KitchenDyspnea 2010   syndrome - extensive  . Erectile dysfunction   . GERD (gastroesophageal reflux disease)   . H/O hiatal hernia   . History of blood transfusion 2014  . Hx of adenomatous colonic polyps 2007  . Hyperlipidemia   . Hypertensive heart disease   . Inguinal hernia   . Iron deficiency anemia   . Shingles 1990's  . Sigmoid diverticulosis   . Upper GI bleeding 2014   Gastritis and possible duodenal ulcer seen on EGD, felt secondary to NSAIDs  . Urethral stricture 11/2008  . Vasovagal  syncope 05/2006  . Weight loss     SURGICAL HISTORY: Past Surgical History:  Procedure Laterality Date  . CARDIAC CATHETERIZATION  2007   Left main 100%, RCA 70% ostial, all grafts were patent, EF 40-45 percent, distal circumflex subtotal after graft insertion, medical therapy   . CHOLECYSTECTOMY  1980's  . CORONARY ARTERY BYPASS GRAFT  1994    LIMA-LAD, SVG-D1, SVG-OM,  SVG-RCA; performed in [place], [place]  . ESOPHAGOGASTRODUODENOSCOPY (EGD) N/A 03/25/2013   Performed by Missy Sabins, MD at [place]  . INGUINAL HERNIA REPAIR Left   .  Left Heart Cath and Cors/Grafts Angiography N/A 10/14/2014   Performed by Lorretta Harp, MD at [place] [facility]  . TRANSURETHRAL RESECTION OF PROSTATE      SOCIAL HISTORY: Social History   Socioeconomic History  . Marital status: Married    Spouse name: Not on file  . Number of children: Not on file  . Years of education: Not on file  . Highest education level: Not on file  Social Needs  . Financial resource strain: Not on file  . Food insecurity - worry: Not on file  . Food insecurity - inability: Not on file  . Transportation needs - medical: Not on file  . Transportation needs - non-medical: Not on file  Occupational History  . Occupation: Retired  Tobacco Use  . Smoking status: Never Smoker  . Smokeless tobacco: Never Used  Substance and Sexual Activity  . Alcohol use: Yes    Alcohol/week: 4.2 oz    Types: 7 Glasses of wine per week    Comment: wine - daily  . Drug use: No  . Sexual activity: No    Birth control/protection: Abstinence  Other Topics Concern  . Not on file  Social History Narrative   He lives in [facility], independent living, with his wife.    FAMILY HISTORY: Family History  Problem Relation Age of Onset  . Hypertension Mother   . Heart disease Mother   . Heart disease Father   . Heart failure Brother   . Heart attack Neg Hx   . Stroke Neg Hx     ALLERGIES:  has No Known Allergies.  MEDICATIONS:  Current Outpatient Medications  Medication Sig Dispense Refill  . acetaminophen (TYLENOL) 325 MG tablet Take 2 tablets (650 mg total) by mouth every 4 (four) hours as needed for headache or mild pain.    [name] albuterol (PROAIR HFA) 108 (90 Base) MCG/ACT inhaler Inhale 2 puffs into the lungs every 6 (six) hours as needed for wheezing or shortness of breath.    [name] aspirin EC 81 MG tablet Take 81 mg by mouth daily as needed for moderate pain.     [name] desvenlafaxine (PRISTIQ) 50 MG 24 hr tablet Take 25 mg by mouth daily.     [name] esomeprazole (NEXIUM)  40 MG capsule Take 40 mg by mouth daily as needed (acid reflux/ heartburn).    . FERROCITE 324 MG TABS tablet TAKE 1 TABLET BY MOUTH QOD  11  . isosorbide mononitrate (IMDUR) 30 MG 24 hr tablet Take 30 mg by mouth daily.    [name] lisinopril (PRINIVIL,ZESTRIL) 10 MG tablet Take 2 tablets (20 mg total) by mouth daily. 60 tablet 0  . LYRICA 50 MG capsule Take 50 mg by mouth daily as needed (pain).     . nitroGLYCERIN (NITROSTAT) 0.4 MG SL tablet Place 1 tablet (0.4 mg total) under the tongue every 5 (five) minutes x  3 doses as needed for chest pain. 25 tablet 4  . Omega-3 Fatty Acids (FISH OIL PO) Take 1 capsule by mouth daily.    . vitamin B-12 (CYANOCOBALAMIN) 1000 MCG tablet Take 1,000 mcg by mouth every evening.      No current facility-administered medications for this visit.     REVIEW OF SYSTEMS:    10 Point review of Systems was done is negative except as noted above.  PHYSICAL EXAMINATION:  ECOG PERFORMANCE STATUS: 2 - Symptomatic, <50% confined to bed  . Vitals:   04/04/17 1201  BP: (!) 145/62  Pulse: 66  Resp: 17  Temp: 97.9 F (36.6 C)  SpO2: 100%   Filed Weights   04/04/17 1201  Weight: 157 lb 4.8 oz (71.4 kg)   .Body mass index is 25.39 kg/m.  GENERAL:alert, in no acute distress and comfortable SKIN: no acute rashes, no significant lesions EYES: conjunctiva are pink and non-injected, sclera anicteric OROPHARYNX: MMM, no exudates, no oropharyngeal erythema or ulceration NECK: supple, no JVD LYMPH:  no palpable lymphadenopathy in the cervical, axillary or inguinal regions LUNGS: clear to auscultation b/l with normal respiratory effort HEART: regular rate & rhythm ABDOMEN:  normoactive bowel sounds , non tender, not distended. Extremity: no pedal edema PSYCH: alert & oriented x 3 with fluent speech NEURO: no focal motor/sensory deficits  LABORATORY DATA:  I have reviewed the data as listed  . CBC Latest Ref Rng & Units 03/03/2017 01/24/2016 01/23/2016  WBC 4.0 -  10.5 K/uL 8.6 7.9 7.7  Hemoglobin 13.0 - 17.0 g/dL 12.9(L) 12.9(L) 13.5  Hematocrit 39.0 - 52.0 % 40.3 40.2 42.0  Platelets 150 - 400 K/uL 173 139(L) 133(L)    . CMP Latest Ref Rng & Units 03/03/2017 01/24/2016 01/23/2016  Glucose 65 - 99 mg/dL 95 99 98  BUN 6 - 20 mg/dL 41(H) 25(H) 23(H)  Creatinine 0.61 - 1.24 mg/dL 2.10(H) 1.69(H) 1.63(H)  Sodium 135 - 145 mmol/L 137 140 141  Potassium 3.5 - 5.1 mmol/L 5.3(H) 4.0 4.6  Chloride 101 - 111 mmol/L 107 105 110  CO2 22 - 32 mmol/L 23 22 24   Calcium 8.9 - 10.3 mg/dL 9.1 8.8(L) 9.2  Total Protein 6.5 - 8.1 g/dL 6.8 - -  Total Bilirubin 0.3 - 1.2 mg/dL 0.5 - -  Alkaline Phos 38 - 126 U/L 57 - -  AST 15 - 41 U/L 16 - -  ALT 17 - 63 U/L 14(L) - -     RADIOGRAPHIC STUDIES: I have personally reviewed the radiological images as listed and agreed with the findings in the report. Dg Chest 1 View  Result Date: 03/28/2017 CLINICAL DATA:  Post left thoracentesis.  1 L of fluid removed. EXAM: CHEST 1 VIEW COMPARISON:  03/03/2017 and CT 03/03/2017 FINDINGS: Examination demonstrates significant improvement in the previously noted moderate size left pleural effusion. Small amount of residual left pleural fluid and likely mild left basilar atelectasis. No pneumothorax. Mild stable cardiomegaly. Remainder of the exam is unchanged to include moderate size hiatal hernia. IMPRESSION: Significant decrease in size of patient's left pleural effusion with small amount of residual left pleural fluid. No pneumothorax. Mild stable cardiomegaly. Electronically Signed   By: Marin Olp M.D.   On: 03/28/2017 11:00   Mr Brain Wo Contrast  Result Date: 03/25/2017 CLINICAL DATA:  Lung cancer.  Staging EXAM: MRI HEAD WITHOUT CONTRAST TECHNIQUE: Multiplanar, multiecho pulse sequences of the brain and surrounding structures were obtained without intravenous contrast. COMPARISON:  CT head 01/22/2016 FINDINGS:  Brain: Intravenous contrast not administered due to renal  insufficiency. This limits sensitivity of the examination for metastatic disease. Moderate to advanced generalized atrophy. Scattered small white matter hyperintensities throughout the white matter brainstem. Pattern most consistent with chronic microvascular ischemia. Chronic infarct in the right thalamus. Negative for acute infarct. Negative for hemorrhage or edema. Negative for mass lesion. Chronic microhemorrhage right inferior cerebellum. No other areas of hemorrhage Vascular: Normal arterial flow voids Skull and upper cervical spine: Negative Sinuses/Orbits: Mucosal edema paranasal sinuses with air-fluid level in the sphenoid sinus. Other: None IMPRESSION: Moderate to advanced atrophy. Moderate chronic microvascular ischemic change in the white matter. No acute infarct Negative for mass lesion. Note that small metastatic deposits can be missed without intravenous contrast. Electronically Signed   By: Franchot Gallo M.D.   On: 03/25/2017 10:06   Nm Pet Image Initial (pi) Skull Base To Thigh  Result Date: 03/17/2017 CLINICAL DATA:  Initial treatment strategy for left upper lobe lung lesion. EXAM: NUCLEAR MEDICINE PET SKULL BASE TO THIGH TECHNIQUE: 7.99 mCi F-18 FDG was injected intravenously. Full-ring PET imaging was performed from the skull base to thigh after the radiotracer. CT data was obtained and used for attenuation correction and anatomic localization. FASTING BLOOD GLUCOSE:  Value: 89 mg/dl COMPARISON:  Chest CT 03/03/2017. FINDINGS: NECK: No hypermetabolic lymph nodes in the neck. No neck mass. Asymmetric FDG activity is noted in the right longus coli muscle. This is not pathologic. CHEST: Left upper lobe lung mass is hypermetabolic with SUV max of 70.35. There are also small hypermetabolic left hilar lymph nodes measuring 9 mm with SUV max of 8.4. No other lung lesions are identified. There is a large left pleural effusion but no obvious hypermetabolic pleural nodules. Stable tortuosity, ectasia  and calcification of the thoracic aorta along with dense three-vessel coronary artery calcifications and surgical changes from bypass surgery. Numerous calcified mediastinal and hilar lymph nodes are noted. ABDOMEN/PELVIS: No abnormal hypermetabolic activity within the liver, pancreas, adrenal glands, or spleen. No hypermetabolic lymph nodes in the abdomen or pelvis. Advanced atherosclerotic calcifications involving the abdominal aorta and branch vessels. Large right inguinal hernia containing small bowel loops without findings for incarceration or obstruction. TURP defect involving enlarged prostate gland. SKELETON: No focal hypermetabolic activity to suggest skeletal metastasis. IMPRESSION: 1. Left upper lobe lung mass is hypermetabolic and consistent with primary lung neoplasm. There is also metastatic left hilar adenopathy. 2. Moderate to large left pleural effusion with overlying atelectasis but no obvious hypermetabolic pleural nodules. 3. No findings for metastatic disease involving the neck, abdomen/pelvis or osseous structures. Electronically Signed   By: Marijo Sanes M.D.   On: 03/17/2017 10:40   US Thoracentesis Asp Pleural Space W/img Guide  Result Date: 03/28/2017 INDICATION: Possible Pulmonary malignancy with left-sided pleural effusion. Request is made for diagnostic and therapeutic thoracentesis. EXAM: ULTRASOUND GUIDED DIAGNOSTIC AND THERAPEUTIC THORACENTESIS MEDICATIONS: 2% lidocaine COMPLICATIONS: None immediate. PROCEDURE: An ultrasound guided thoracentesis was thoroughly discussed with the patient and questions answered. The benefits, risks, alternatives and complications were also discussed. The patient understands and wishes to proceed with the procedure. Written consent was obtained. Ultrasound was performed to localize and mark an adequate pocket of fluid in the left chest. The area was then prepped and draped in the normal sterile fashion. 2% Lidocaine was used for local anesthesia.  Under ultrasound guidance a Safe-T-Centesis catheter was introduced. Thoracentesis was performed. The catheter was removed and a dressing applied. FINDINGS: A total of approximately 1 L of serous fluid  was removed. Samples were sent to the laboratory as requested by the clinical team. IMPRESSION: Successful ultrasound guided left thoracentesis yielding 1 L of pleural fluid. Read by: Saverio Danker, PA-C Electronically Signed   By: Corrie Mckusick D.O.   On: 03/28/2017 11:14    ASSESSMENT & PLAN:   DEWEY VIENS is a wonderful 83 y.o. male with left upper lobe mass.  1) Newly diagnosed Metastatic Left lung Adenocarcinoma involving LUL lung mass left lung with associated moderated malignant left pleural effusion and left hilar LNadenopathy.  PLAN -Patient breathing is improved after diagnostic/therapeutic thoracentesis. -I discussed in detail with pt and his family results of his thoracentesis and MRI brain in great detail  -the diagnosis, stage, natural history, prognosis and treatment options were discussed in details. --We sent out Foundation One genetic testing to evaluate for specific mutations so that we may potentially have more options for oral targeted treatment options and PDL1 testing in regards to immunotherapy, we should have the results next week. -if mutation present would recommended targeted therapy -if targetable mutations not present would need to consider palliative systemic immunotherapy vs Best support cares. -palliative single agent chemoRx - less preferable given age and medical co-morbidities (would not tolerate platinum doublet) -patient aware treatments would be palliative and not curative. -I previously gave the patient a referral to Radiation Oncology to consider SBRT considering this could be limited stage Lung cancer but now noted to be metastatic to pleural space. Might have a role for palliative RT in the future as a part of broader treatment with systemic  treatments. -As of today, 04/04/17, he is relatively asymptomatic and not very limited symptomatically. I did inform him that recurrent malignant pleural effusion will likely need attention as well. -he will think about the treatment options and let us know.  RTC with Dr Irene Limbo in 2 weeks to discussed final treatment plan based on PDL1 and foundation One results.  All of the patients questions were answered with apparent satisfaction. The patient knows to call the clinic with any problems, questions or concerns.  I spent 30 minutes counseling the patient face to face. The total time spent in the appointment was 40 minutes and more than 50% was on counseling and direct patient cares.  I have reviewed the above documentation for accuracy and completeness, and I agree with the above.  Sullivan Lone MD [place] AAHIVMS [facility] Hematology/Oncology Physician [facility]  (Office):       [phone] (Work cell):  [phone] (Fax):           [phone]  04/04/2017 11:48 AM   This document serves as a record of services personally performed by Sullivan Lone, MD. It was created on his behalf by Reola Mosher, a trained medical scribe. The creation of this record is based on the scribe's personal observations and the provider's statements to them.   .I have reviewed the above documentation for accuracy and completeness, and I agree with the above. Brunetta Genera MD MS

## 2017-04-04 NOTE — Patient Instructions (Signed)
Thank you for choosing [facility] to provide your oncology and hematology care.  To afford each patient quality time with our providers, please arrive 30 minutes before your scheduled appointment time.  If you arrive late for your appointment, you may be asked to reschedule.  We strive to give you quality time with our providers, and arriving late affects you and other patients whose appointments are after yours.   If you are a no show for multiple scheduled visits, you may be dismissed from the clinic at the providers discretion.    Again, thank you for choosing [facility], our hope is that these requests will decrease the amount of time that you wait before being seen by our physicians.  ______________________________________________________________________  Should you have questions after your visit to the [facility], please contact our office at [phone] between the hours of 8:30 and 4:30 p.m.    Voicemails left after 4:30p.m will not be returned until the following business day.    For prescription refill requests, please have your pharmacy contact us directly.  Please also try to allow 48 hours for prescription requests.    Please contact the scheduling department for questions regarding scheduling.  For scheduling of procedures such as PET scans, CT scans, MRI, Ultrasound, etc please contact central scheduling at [phone].    Resources For Cancer Patients and Caregivers:   Oncolink.org:  A wonderful resource for patients and healthcare providers for information regarding your disease, ways to tract your treatment, what to expect, etc.     American Cancer Society:  800-227-2345  Can help patients locate various types of support and financial assistance  Cancer Care: 1-800-813-HOPE (4673) Provides financial assistance, online support groups, medication/co-pay assistance.    [place] DSS:  336-641-3447 Where to apply for food  stamps, Medicaid, and utility assistance  Medicare Rights Center: 800-333-4114 Helps people with Medicare understand their rights and benefits, navigate the Medicare system, and secure the quality healthcare they deserve  SCAT: 336-333-6589 [place] [employer]'s shared-ride transportation service for eligible riders who have a disability that prevents them from riding the fixed route bus.    For additional information on assistance programs please contact our social worker:   Grier Hock/Abigail Elmore:  [phone]            

## 2017-04-07 ENCOUNTER — Encounter: Payer: Self-pay | Admitting: Radiation Oncology

## 2017-04-07 ENCOUNTER — Ambulatory Visit
Admission: RE | Admit: 2017-04-07 | Discharge: 2017-04-07 | Disposition: A | Discharge status: Home / Self Care | Payer: Medicare Other | Source: Ambulatory Visit | Attending: Radiation Oncology | Admitting: Radiation Oncology

## 2017-04-07 ENCOUNTER — Other Ambulatory Visit: Payer: Self-pay

## 2017-04-07 DIAGNOSIS — J910 Malignant pleural effusion: Secondary | ICD-10-CM | POA: Insufficient documentation

## 2017-04-07 DIAGNOSIS — C3412 Malignant neoplasm of upper lobe, left bronchus or lung: Secondary | ICD-10-CM | POA: Diagnosis not present

## 2017-04-07 DIAGNOSIS — N35919 Unspecified urethral stricture, male, unspecified site: Secondary | ICD-10-CM | POA: Insufficient documentation

## 2017-04-07 DIAGNOSIS — N183 Chronic kidney disease, stage 3 (moderate): Secondary | ICD-10-CM | POA: Insufficient documentation

## 2017-04-07 DIAGNOSIS — K219 Gastro-esophageal reflux disease without esophagitis: Secondary | ICD-10-CM | POA: Insufficient documentation

## 2017-04-07 DIAGNOSIS — Z7982 Long term (current) use of aspirin: Secondary | ICD-10-CM | POA: Insufficient documentation

## 2017-04-07 DIAGNOSIS — K449 Diaphragmatic hernia without obstruction or gangrene: Secondary | ICD-10-CM | POA: Insufficient documentation

## 2017-04-07 DIAGNOSIS — F419 Anxiety disorder, unspecified: Secondary | ICD-10-CM | POA: Insufficient documentation

## 2017-04-07 DIAGNOSIS — E785 Hyperlipidemia, unspecified: Secondary | ICD-10-CM | POA: Insufficient documentation

## 2017-04-07 DIAGNOSIS — F329 Major depressive disorder, single episode, unspecified: Secondary | ICD-10-CM | POA: Insufficient documentation

## 2017-04-07 DIAGNOSIS — I1310 Hypertensive heart and chronic kidney disease without heart failure, with stage 1 through stage 4 chronic kidney disease, or unspecified chronic kidney disease: Secondary | ICD-10-CM | POA: Insufficient documentation

## 2017-04-07 DIAGNOSIS — Z9889 Other specified postprocedural states: Secondary | ICD-10-CM | POA: Insufficient documentation

## 2017-04-07 DIAGNOSIS — N400 Enlarged prostate without lower urinary tract symptoms: Secondary | ICD-10-CM | POA: Insufficient documentation

## 2017-04-07 DIAGNOSIS — I700 Atherosclerosis of aorta: Secondary | ICD-10-CM | POA: Insufficient documentation

## 2017-04-07 DIAGNOSIS — J9811 Atelectasis: Secondary | ICD-10-CM | POA: Insufficient documentation

## 2017-04-07 DIAGNOSIS — Z79899 Other long term (current) drug therapy: Secondary | ICD-10-CM | POA: Insufficient documentation

## 2017-04-07 DIAGNOSIS — I2510 Atherosclerotic heart disease of native coronary artery without angina pectoris: Secondary | ICD-10-CM | POA: Insufficient documentation

## 2017-04-07 DIAGNOSIS — Z951 Presence of aortocoronary bypass graft: Secondary | ICD-10-CM | POA: Insufficient documentation

## 2017-04-07 NOTE — Progress Notes (Signed)
Radiation Oncology         [phone] ________________________________  Initial Outpatient Consultation  Name: Scott [name] MRN: [id number]  Date: 04/07/2017  DOB: Jan 17, 1924  LE:XNTZG, Herbie [place], MD  Brunetta Genera, MD   REFERRING PHYSICIAN: Brunetta Genera, MD  DIAGNOSIS: The encounter diagnosis was Primary adenocarcinoma of upper lobe of left lung ([place]).  Stage IV (cT2, cN1, pM1a) Adenocarcinoma of the lung with primary site in the left upper lobe  HISTORY OF PRESENT ILLNESS::Scott [name] is a 83 y.o. male who is presenting for consultation of possible radiation therapy for left lung cancer.   Of note, patient was seen in the ED on 03/03/17 for SOB symptoms where a chest CT was done revealing a 2.4 x 3.6 cm mass in the left upper lobe concerning for neoplasm with lymphangitic tumor spread. He was then referred to Dr. Irene Limbo with medical oncology and a PET scan was done on 03/17/17. This revealed the left upper lobe mass to be hypermetabolic and consistent with primary lung neoplasm. Metastatic left hilar adenopathy was also identified. A brain MRI without contrast was performed on 03/24/17 and was negative for any mass lesions. A thoracentesis was done on 03/28/17 with cytology consistent with malignant cells consistent with metastatic adenocarcinoma of the lung.  Pt presents to the office today accompanied by his wife and daughter. He reports that he is doing well overall. He states he become short of breath when walking the halls at the assisted living center in which he and his wife live. He reports some mild chest pain but does not take any pain medications for it. His breathing has improved since thoracentesis.  On review of systems, intermittent SOB, mild chest discomfort. He denies hemoptysis, low back pain, HA. He denies ever smoking or being exposed to second hand smoke for prolonged periods of time. He reports drinking one glass of wine daily.   PREVIOUS  RADIATION THERAPY: No  PAST MEDICAL HISTORY:  has a past medical history of Anxiety, Bradycardia, CAD (coronary artery disease), CKD (chronic kidney disease), stage III ([place]), Claudication ([place]), Depression, Diastolic dysfunction, Dyspnea (2010), Erectile dysfunction, GERD (gastroesophageal reflux disease), H/O hiatal hernia, History of blood transfusion (2014), adenomatous colonic polyps (2007), Hyperlipidemia, Hypertensive heart disease, Inguinal hernia, Iron deficiency anemia, Shingles (1990's), Sigmoid diverticulosis, Upper GI bleeding (2014), Urethral stricture (11/2008), Vasovagal syncope (05/2006), and Weight loss.    PAST SURGICAL HISTORY: Past Surgical History:  Procedure Laterality Date  . CARDIAC CATHETERIZATION  2007   Left main 100%, RCA 70% ostial, all grafts were patent, EF 40-45 percent, distal circumflex subtotal after graft insertion, medical therapy   . CHOLECYSTECTOMY  1980's  . CORONARY ARTERY BYPASS GRAFT  1994    LIMA-LAD, SVG-D1, SVG-OM,  SVG-RCA; performed in [place], [place]  . ESOPHAGOGASTRODUODENOSCOPY (EGD) N/A 03/25/2013   Performed by Missy Sabins, MD at [facility]  . INGUINAL HERNIA REPAIR Left   . Left Heart Cath and Cors/Grafts Angiography N/A 10/14/2014   Performed by Lorretta Harp, MD at [facility]  . TRANSURETHRAL RESECTION OF PROSTATE      FAMILY HISTORY: family history includes Heart disease in his father and mother; Heart failure in his brother; Hypertension in his mother.  SOCIAL HISTORY:  reports that  has never smoked. he has never used smokeless tobacco. He reports that he drinks about 4.2 oz of alcohol per week. He reports that he does not use drugs.  ALLERGIES: Patient has no known allergies.  MEDICATIONS:  Current Outpatient Medications  Medication Sig Dispense Refill  . aspirin EC 81 MG tablet Take 81 mg by mouth daily as needed for moderate pain.     [name] BREO ELLIPTA 100-25 MCG/INH AEPB 1 PUFF BY MOUTH DAILY  11  .  buPROPion (WELLBUTRIN XL) 150 MG 24 hr tablet     . desvenlafaxine (PRISTIQ) 50 MG 24 hr tablet Take 25 mg by mouth daily.     [name] esomeprazole (NEXIUM) 40 MG capsule Take 40 mg by mouth daily as needed (acid reflux/ heartburn).    . FERROCITE 324 MG TABS tablet TAKE 1 TABLET BY MOUTH QOD  11  . isosorbide mononitrate (IMDUR) 30 MG 24 hr tablet Take 30 mg by mouth daily.    [name] lisinopril (PRINIVIL,ZESTRIL) 10 MG tablet Take 2 tablets (20 mg total) by mouth daily. 60 tablet 0  . nitroGLYCERIN (NITROSTAT) 0.4 MG SL tablet Place 1 tablet (0.4 mg total) under the tongue every 5 (five) minutes x 3 doses as needed for chest pain. 25 tablet 4  . Omega-3 Fatty Acids (FISH OIL PO) Take 1 capsule by mouth daily.    . rosuvastatin (CRESTOR) 10 MG tablet     . vitamin B-12 (CYANOCOBALAMIN) 1000 MCG tablet Take 1,000 mcg by mouth every evening.     [name] acetaminophen (TYLENOL) 325 MG tablet Take 2 tablets (650 mg total) by mouth every 4 (four) hours as needed for headache or mild pain. (Patient not taking: Reported on 04/07/2017)    . albuterol (PROAIR HFA) 108 (90 Base) MCG/ACT inhaler Inhale 2 puffs into the lungs every 6 (six) hours as needed for wheezing or shortness of breath.    [name] LYRICA 50 MG capsule Take 50 mg by mouth daily as needed (pain).      No current facility-administered medications for this encounter.     REVIEW OF SYSTEMS:  A 10+ POINT REVIEW OF SYSTEMS WAS OBTAINED including neurology, dermatology, psychiatry, cardiac, respiratory, lymph, extremities, GI, GU, Musculoskeletal, constitutional, breasts, reproductive, HEENT.  All pertinent positives are noted in the HPI.  All others are negative.   PHYSICAL EXAM:  height is 5\' 6"  (1.676 m) and weight is 158 lb 3.2 oz (71.8 kg). His oral temperature is 97.9 F (36.6 C). His blood pressure is 133/103 (abnormal) and his pulse is 63. His oxygen saturation is 98%.   General: Alert and oriented, in no acute distress HEENT: Head is normocephalic.  Extraocular movements are intact. Oropharynx is clear. Neck: Neck is supple, no palpable cervical or supraclavicular lymphadenopathy. Heart: Regular in rate and rhythm with no murmurs, rubs, or gallops. Chest: Mildly decreased breath sounds on the left lower lung field. No rhonchi, wheezes, or rales. Abdomen: Soft, nontender, nondistended, with no rigidity or guarding. Extremities: No cyanosis or edema. Lymphatics: see Neck Exam Skin: No concerning lesions. Musculoskeletal: symmetric strength and muscle tone throughout. Support brace noted along the right wrist area Neurologic: Cranial nerves II through XII are grossly intact. No obvious focalities. Speech is fluent. Coordination is intact. Psychiatric: Judgment and insight are intact. Affect is appropriate.   ECOG = 1  LABORATORY DATA:  Lab Results  Component Value Date   WBC 8.6 03/03/2017   HGB 12.9 (L) 03/03/2017   HCT 40.3 03/03/2017   MCV 89.2 03/03/2017   PLT 173 03/03/2017   NEUTROABS 6.1 03/03/2017   Lab Results  Component Value Date   NA 137 03/03/2017   K 5.3 (H) 03/03/2017   CL 107 03/03/2017  CO2 23 03/03/2017   GLUCOSE 95 03/03/2017   CREATININE 2.10 (H) 03/03/2017   CALCIUM 9.1 03/03/2017      RADIOGRAPHY: Dg Chest 1 View  Result Date: 03/28/2017 CLINICAL DATA:  Post left thoracentesis.  1 L of fluid removed. EXAM: CHEST 1 VIEW COMPARISON:  03/03/2017 and CT 03/03/2017 FINDINGS: Examination demonstrates significant improvement in the previously noted moderate size left pleural effusion. Small amount of residual left pleural fluid and likely mild left basilar atelectasis. No pneumothorax. Mild stable cardiomegaly. Remainder of the exam is unchanged to include moderate size hiatal hernia. IMPRESSION: Significant decrease in size of patient's left pleural effusion with small amount of residual left pleural fluid. No pneumothorax. Mild stable cardiomegaly. Electronically Signed   By: Marin Olp M.D.   On:  03/28/2017 11:00   Mr Brain Wo Contrast  Result Date: 03/25/2017 CLINICAL DATA:  Lung cancer.  Staging EXAM: MRI HEAD WITHOUT CONTRAST TECHNIQUE: Multiplanar, multiecho pulse sequences of the brain and surrounding structures were obtained without intravenous contrast. COMPARISON:  CT head 01/22/2016 FINDINGS: Brain: Intravenous contrast not administered due to renal insufficiency. This limits sensitivity of the examination for metastatic disease. Moderate to advanced generalized atrophy. Scattered small white matter hyperintensities throughout the white matter brainstem. Pattern most consistent with chronic microvascular ischemia. Chronic infarct in the right thalamus. Negative for acute infarct. Negative for hemorrhage or edema. Negative for mass lesion. Chronic microhemorrhage right inferior cerebellum. No other areas of hemorrhage Vascular: Normal arterial flow voids Skull and upper cervical spine: Negative Sinuses/Orbits: Mucosal edema paranasal sinuses with air-fluid level in the sphenoid sinus. Other: None IMPRESSION: Moderate to advanced atrophy. Moderate chronic microvascular ischemic change in the white matter. No acute infarct Negative for mass lesion. Note that small metastatic deposits can be missed without intravenous contrast. Electronically Signed   By: Franchot Gallo M.D.   On: 03/25/2017 10:06   Nm Pet Image Initial (pi) Skull Base To Thigh  Result Date: 03/17/2017 CLINICAL DATA:  Initial treatment strategy for left upper lobe lung lesion. EXAM: NUCLEAR MEDICINE PET SKULL BASE TO THIGH TECHNIQUE: 7.99 mCi F-18 FDG was injected intravenously. Full-ring PET imaging was performed from the skull base to thigh after the radiotracer. CT data was obtained and used for attenuation correction and anatomic localization. FASTING BLOOD GLUCOSE:  Value: 89 mg/dl COMPARISON:  Chest CT 03/03/2017. FINDINGS: NECK: No hypermetabolic lymph nodes in the neck. No neck mass. Asymmetric FDG activity is noted in  the right longus coli muscle. This is not pathologic. CHEST: Left upper lobe lung mass is hypermetabolic with SUV max of 88.41. There are also small hypermetabolic left hilar lymph nodes measuring 9 mm with SUV max of 8.4. No other lung lesions are identified. There is a large left pleural effusion but no obvious hypermetabolic pleural nodules. Stable tortuosity, ectasia and calcification of the thoracic aorta along with dense three-vessel coronary artery calcifications and surgical changes from bypass surgery. Numerous calcified mediastinal and hilar lymph nodes are noted. ABDOMEN/PELVIS: No abnormal hypermetabolic activity within the liver, pancreas, adrenal glands, or spleen. No hypermetabolic lymph nodes in the abdomen or pelvis. Advanced atherosclerotic calcifications involving the abdominal aorta and branch vessels. Large right inguinal hernia containing small bowel loops without findings for incarceration or obstruction. TURP defect involving enlarged prostate gland. SKELETON: No focal hypermetabolic activity to suggest skeletal metastasis. IMPRESSION: 1. Left upper lobe lung mass is hypermetabolic and consistent with primary lung neoplasm. There is also metastatic left hilar adenopathy. 2. Moderate to large left pleural effusion  with overlying atelectasis but no obvious hypermetabolic pleural nodules. 3. No findings for metastatic disease involving the neck, abdomen/pelvis or osseous structures. Electronically Signed   By: Marijo Sanes M.D.   On: 03/17/2017 10:40   US Thoracentesis Asp Pleural Space W/img Guide  Result Date: 03/28/2017 INDICATION: Possible Pulmonary malignancy with left-sided pleural effusion. Request is made for diagnostic and therapeutic thoracentesis. EXAM: ULTRASOUND GUIDED DIAGNOSTIC AND THERAPEUTIC THORACENTESIS MEDICATIONS: 2% lidocaine COMPLICATIONS: None immediate. PROCEDURE: An ultrasound guided thoracentesis was thoroughly discussed with the patient and questions answered.  The benefits, risks, alternatives and complications were also discussed. The patient understands and wishes to proceed with the procedure. Written consent was obtained. Ultrasound was performed to localize and mark an adequate pocket of fluid in the left chest. The area was then prepped and draped in the normal sterile fashion. 2% Lidocaine was used for local anesthesia. Under ultrasound guidance a Safe-T-Centesis catheter was introduced. Thoracentesis was performed. The catheter was removed and a dressing applied. FINDINGS: A total of approximately 1 L of serous fluid was removed. Samples were sent to the laboratory as requested by the clinical team. IMPRESSION: Successful ultrasound guided left thoracentesis yielding 1 L of pleural fluid. Read by: Saverio Danker, PA-C Electronically Signed   By: Corrie Mckusick D.O.   On: 03/28/2017 11:14      IMPRESSION: Stage IV (cT2, cN1, pM1a) adenocarcinoma consistent with left lung primary   Scott [name] is a  83 y.o. male who presents today to discuss the role of radiotherapy in the ongoing management of his left lung cancer. Patient was found to have a malignant pleural effusion which changes his stage to IV adenocarcinoma of the lung therefore he would not be a candidate for curative radiation therapy. He does not have any symptoms such as painful osseous metastasis, hemoptysis or post obstructive pneumonia and therefore patient is not a candidate for palliative radiation therapy.   PLAN: Follow up PRN with radiation oncology. Patient with follow up with Dr. Irene Limbo once his results are back considering his targeted therapy.      ------------------------------------------------  Blair Promise, PhD, MD  This document serves as a record of services personally performed by Gery Pray, MD. It was created on her behalf by Marlowe Kays, a trained medical scribe. The creation of this record is based on the scribe's personal observations and the provider's  statements to them. This document has been checked and approved by the attending provider.

## 2017-04-09 ENCOUNTER — Other Ambulatory Visit: Payer: Self-pay

## 2017-04-09 ENCOUNTER — Emergency Department (HOSPITAL_COMMUNITY)
Admission: EM | Admit: 2017-04-09 | Discharge: 2017-04-09 | Disposition: A | Discharge status: Home / Self Care | Payer: Medicare Other | Attending: Emergency Medicine | Admitting: Emergency Medicine

## 2017-04-09 ENCOUNTER — Emergency Department (HOSPITAL_COMMUNITY): Payer: Medicare Other

## 2017-04-09 ENCOUNTER — Encounter (HOSPITAL_COMMUNITY): Payer: Self-pay | Admitting: Emergency Medicine

## 2017-04-09 DIAGNOSIS — N183 Chronic kidney disease, stage 3 (moderate): Secondary | ICD-10-CM | POA: Diagnosis not present

## 2017-04-09 DIAGNOSIS — I2510 Atherosclerotic heart disease of native coronary artery without angina pectoris: Secondary | ICD-10-CM | POA: Diagnosis not present

## 2017-04-09 DIAGNOSIS — N35919 Unspecified urethral stricture, male, unspecified site: Secondary | ICD-10-CM | POA: Diagnosis not present

## 2017-04-09 DIAGNOSIS — I5030 Unspecified diastolic (congestive) heart failure: Secondary | ICD-10-CM | POA: Diagnosis not present

## 2017-04-09 DIAGNOSIS — I700 Atherosclerosis of aorta: Secondary | ICD-10-CM | POA: Diagnosis not present

## 2017-04-09 DIAGNOSIS — I1310 Hypertensive heart and chronic kidney disease without heart failure, with stage 1 through stage 4 chronic kidney disease, or unspecified chronic kidney disease: Secondary | ICD-10-CM | POA: Diagnosis not present

## 2017-04-09 DIAGNOSIS — J9811 Atelectasis: Secondary | ICD-10-CM | POA: Diagnosis not present

## 2017-04-09 DIAGNOSIS — R0789 Other chest pain: Secondary | ICD-10-CM | POA: Insufficient documentation

## 2017-04-09 DIAGNOSIS — M25512 Pain in left shoulder: Secondary | ICD-10-CM | POA: Insufficient documentation

## 2017-04-09 DIAGNOSIS — N184 Chronic kidney disease, stage 4 (severe): Secondary | ICD-10-CM | POA: Diagnosis not present

## 2017-04-09 DIAGNOSIS — J910 Malignant pleural effusion: Secondary | ICD-10-CM | POA: Diagnosis not present

## 2017-04-09 DIAGNOSIS — K449 Diaphragmatic hernia without obstruction or gangrene: Secondary | ICD-10-CM | POA: Diagnosis not present

## 2017-04-09 DIAGNOSIS — F419 Anxiety disorder, unspecified: Secondary | ICD-10-CM | POA: Diagnosis not present

## 2017-04-09 DIAGNOSIS — I130 Hypertensive heart and chronic kidney disease with heart failure and stage 1 through stage 4 chronic kidney disease, or unspecified chronic kidney disease: Secondary | ICD-10-CM | POA: Diagnosis not present

## 2017-04-09 DIAGNOSIS — M25511 Pain in right shoulder: Secondary | ICD-10-CM | POA: Diagnosis not present

## 2017-04-09 DIAGNOSIS — M25519 Pain in unspecified shoulder: Secondary | ICD-10-CM | POA: Diagnosis not present

## 2017-04-09 DIAGNOSIS — R079 Chest pain, unspecified: Secondary | ICD-10-CM

## 2017-04-09 DIAGNOSIS — C3412 Malignant neoplasm of upper lobe, left bronchus or lung: Secondary | ICD-10-CM | POA: Diagnosis not present

## 2017-04-09 DIAGNOSIS — Z951 Presence of aortocoronary bypass graft: Secondary | ICD-10-CM | POA: Insufficient documentation

## 2017-04-09 DIAGNOSIS — F329 Major depressive disorder, single episode, unspecified: Secondary | ICD-10-CM | POA: Diagnosis not present

## 2017-04-09 DIAGNOSIS — Z79899 Other long term (current) drug therapy: Secondary | ICD-10-CM | POA: Diagnosis not present

## 2017-04-09 DIAGNOSIS — R52 Pain, unspecified: Secondary | ICD-10-CM | POA: Diagnosis not present

## 2017-04-09 DIAGNOSIS — J90 Pleural effusion, not elsewhere classified: Secondary | ICD-10-CM | POA: Diagnosis not present

## 2017-04-09 DIAGNOSIS — E785 Hyperlipidemia, unspecified: Secondary | ICD-10-CM | POA: Diagnosis not present

## 2017-04-09 DIAGNOSIS — N400 Enlarged prostate without lower urinary tract symptoms: Secondary | ICD-10-CM | POA: Diagnosis not present

## 2017-04-09 DIAGNOSIS — K219 Gastro-esophageal reflux disease without esophagitis: Secondary | ICD-10-CM | POA: Diagnosis not present

## 2017-04-09 DIAGNOSIS — Z9889 Other specified postprocedural states: Secondary | ICD-10-CM | POA: Diagnosis not present

## 2017-04-09 DIAGNOSIS — Z7982 Long term (current) use of aspirin: Secondary | ICD-10-CM | POA: Diagnosis not present

## 2017-04-09 LAB — CBC WITH DIFFERENTIAL/PLATELET
Basophils Absolute: 0 10*3/uL (ref 0.0–0.1)
Basophils Relative: 0 %
Eosinophils Absolute: 0.4 10*3/uL (ref 0.0–0.7)
Eosinophils Relative: 4 %
HEMATOCRIT: 41 % (ref 39.0–52.0)
HEMOGLOBIN: 13.2 g/dL (ref 13.0–17.0)
LYMPHS ABS: 1.8 10*3/uL (ref 0.7–4.0)
Lymphocytes Relative: 20 %
MCH: 28.3 pg (ref 26.0–34.0)
MCHC: 32.2 g/dL (ref 30.0–36.0)
MCV: 88 fL (ref 78.0–100.0)
MONO ABS: 0.6 10*3/uL (ref 0.1–1.0)
MONOS PCT: 6 %
NEUTROS ABS: 6.2 10*3/uL (ref 1.7–7.7)
NEUTROS PCT: 70 %
Platelets: 201 10*3/uL (ref 150–400)
RBC: 4.66 MIL/uL (ref 4.22–5.81)
RDW: 13.3 % (ref 11.5–15.5)
WBC: 9 10*3/uL (ref 4.0–10.5)

## 2017-04-09 LAB — COMPREHENSIVE METABOLIC PANEL
ALBUMIN: 3.2 g/dL — AB (ref 3.5–5.0)
ALK PHOS: 69 U/L (ref 38–126)
ALT: 10 U/L — ABNORMAL LOW (ref 17–63)
ANION GAP: 7 (ref 5–15)
AST: 20 U/L (ref 15–41)
BILIRUBIN TOTAL: 0.2 mg/dL — AB (ref 0.3–1.2)
BUN: 34 mg/dL — AB (ref 6–20)
CALCIUM: 8.9 mg/dL (ref 8.9–10.3)
CO2: 26 mmol/L (ref 22–32)
Chloride: 106 mmol/L (ref 101–111)
Creatinine, Ser: 1.98 mg/dL — ABNORMAL HIGH (ref 0.61–1.24)
GFR calc Af Amer: 32 mL/min — ABNORMAL LOW (ref >60)
GFR calc non Af Amer: 28 mL/min — ABNORMAL LOW (ref >60)
GLUCOSE: 109 mg/dL — AB (ref 65–99)
POTASSIUM: 4.5 mmol/L (ref 3.5–5.1)
SODIUM: 139 mmol/L (ref 135–145)
TOTAL PROTEIN: 6.1 g/dL — AB (ref 6.5–8.1)

## 2017-04-09 LAB — I-STAT TROPONIN, ED
Troponin i, poc: 0 ng/mL (ref 0.00–0.08)
Troponin i, poc: 0 ng/mL (ref 0.00–0.08)

## 2017-04-09 LAB — URINALYSIS, ROUTINE W REFLEX MICROSCOPIC
BILIRUBIN URINE: NEGATIVE
GLUCOSE, UA: NEGATIVE mg/dL
HGB URINE DIPSTICK: NEGATIVE
KETONES UR: NEGATIVE mg/dL
Leukocytes, UA: NEGATIVE
Nitrite: NEGATIVE
PH: 6 (ref 5.0–8.0)
Protein, ur: NEGATIVE mg/dL
SPECIFIC GRAVITY, URINE: 1.018 (ref 1.005–1.030)

## 2017-04-09 LAB — LIPASE, BLOOD: Lipase: 48 U/L (ref 11–51)

## 2017-04-09 MED ORDER — PILOCARPINE HCL 1 % OP SOLN
1.0000 [drp] | Freq: Once | OPHTHALMIC | Status: DC
  Filled 2017-04-09: qty 15

## 2017-04-09 NOTE — ED Provider Notes (Signed)
[facility] EMERGENCY DEPARTMENT Provider Note   CSN: [id number] Arrival date & time: 04/09/17  1335     History   Chief Complaint Chief Complaint  Patient presents with  . Chest Pain    HPI Scott [name] is a 83 y.o. male with past medical history of CAD, MI, GERD, hyperlipidemia brought in by EMS from [facility] who presents with bilateral shoulder pain that began this morning.  Patient reports that after he went to breakfast, he went to his room and he started experiencing some bilateral shoulder pain across the anterior aspect of his chest.  Patient describes it as a "unbending pain" but states it was not a sharp or like a pressure. He states it did not feel like his previous MI. He states that it was not worse with deep inspiration.  He did not attempt to exert himself during the pain.  He states that he had some shortness of breath and fatigue with the pain but denies any nausea or diaphoresis.  Patient took ASA x2 and had nitro x2 prior to EMS arrival, which improved his pain.  During EMS transport, patient was given 2 more aspirin.  Patient reports that pain in his shoulders has resolved.  Reports some mild lower abdominal pain.  Patient states that he has been otherwise in his normal state of health.  He denies any fevers, cough, nausea/vomiting, urinary complaints.   The history is provided by the patient.    Past Medical History:  Diagnosis Date  . Anxiety   . Bradycardia    a. BB d/c'd in 2016.  [name] CAD (coronary artery disease)    a. 1983 s/p CABG [facility], [place]);  b. 2007 NSTEMI->med Rx for distal LCX dzs; c. 09/2014 NSTEMI/Cath: LIMA->LAD & diag ok, native RCA dominant and patent.   VG->RCA 100, VG->LCX 100 -->Med Rx.  . CKD (chronic kidney disease), stage III ([place])   . Claudication ([place])    BLE  . Depression   . Diastolic dysfunction    a. 10/2014 Echo: F 60-65%, Gr1 DD, mild AS, mild AI, mild to mod MR, PASP 79mmHg.  [name] Dyspnea 2010   syndrome - extensive  . Erectile dysfunction   . GERD (gastroesophageal reflux disease)   . H/O hiatal hernia   . History of blood transfusion 2014  . Hx of adenomatous colonic polyps 2007  . Hyperlipidemia   . Hypertensive heart disease   . Inguinal hernia   . Iron deficiency anemia   . Shingles 1990's  . Sigmoid diverticulosis   . Upper GI bleeding 2014   Gastritis and possible duodenal ulcer seen on EGD, felt secondary to NSAIDs  . Urethral stricture 11/2008  . Vasovagal syncope 05/2006  . Weight loss     Patient Active Problem List   Diagnosis Date Noted  . Primary adenocarcinoma of upper lobe of left lung ([place]) 04/07/2017  . Decreased libido 08/26/2016  . Altered mental status 01/24/2016  . Acute encephalopathy 01/23/2016  . Delirium 01/22/2016  . Bradycardia, toprol stopped 10/15/2014  . CAD (coronary artery disease) of artery bypass graft, ALL VGs occluded, patent LIMA -->LAD, patent dominant RCA 10/15/2014  . Anemia 03/24/2013  . CKD (chronic kidney disease), stage IV ([place]) 03/24/2013  . Essential hypertension   . Anxiety   . Depression   . GERD (gastroesophageal reflux disease)   . Hyperlipidemia   . Coronary artery disease due to lipid rich plaque   . HIATAL HERNIA 09/21/2008  .  CT, CHEST, ABNORMAL 09/21/2008    Past Surgical History:  Procedure Laterality Date  . CARDIAC CATHETERIZATION  2007   Left main 100%, RCA 70% ostial, all grafts were patent, EF 40-45 percent, distal circumflex subtotal after graft insertion, medical therapy   . CARDIAC CATHETERIZATION N/A 10/14/2014   Procedure: Left Heart Cath and Cors/Grafts Angiography;  Surgeon: Lorretta Harp, MD;  Location: [facility];  Service: Cardiovascular;  Laterality: N/A;  . CHOLECYSTECTOMY  1980's  . CORONARY ARTERY BYPASS GRAFT  1994    LIMA-LAD, SVG-D1, SVG-OM,  SVG-RCA; performed in [place], [place]  . ESOPHAGOGASTRODUODENOSCOPY N/A 03/25/2013   Procedure: ESOPHAGOGASTRODUODENOSCOPY  (EGD);  Surgeon: Missy Sabins, MD;  gastritis, cannot rule out duodenal ulcer   . INGUINAL HERNIA REPAIR Left   . TRANSURETHRAL RESECTION OF PROSTATE         Home Medications    Prior to Admission medications   Medication Sig Start Date End Date Taking? Authorizing Provider  acetaminophen (TYLENOL) 325 MG tablet Take 2 tablets (650 mg total) by mouth every 4 (four) hours as needed for headache or mild pain. 10/15/14  Yes Isaiah Serge, NP  albuterol (PROAIR HFA) 108 (90 Base) MCG/ACT inhaler Inhale 2 puffs into the lungs every 6 (six) hours as needed for wheezing or shortness of breath.   Yes [provider]  aspirin EC 81 MG tablet Take 81 mg by mouth daily as needed for moderate pain.    Yes [provider]  BREO ELLIPTA 100-25 MCG/INH AEPB 1 PUFF BY MOUTH DAILY 03/26/17  Yes [provider]  buPROPion (WELLBUTRIN XL) 150 MG 24 hr tablet Take 150 mg by mouth daily.  02/20/17  Yes [provider]  desvenlafaxine (PRISTIQ) 50 MG 24 hr tablet Take 25 mg by mouth daily.    Yes [provider]  esomeprazole (NEXIUM) 40 MG capsule Take 40 mg by mouth daily as needed (acid reflux/ heartburn).   Yes [provider]  FERROCITE 324 MG TABS tablet TAKE 1 TABLET BY MOUTH QOD 02/20/17  Yes [provider]  isosorbide mononitrate (IMDUR) 30 MG 24 hr tablet Take 30 mg by mouth daily.   Yes [provider]  lisinopril (PRINIVIL,ZESTRIL) 10 MG tablet Take 2 tablets (20 mg total) by mouth daily. 01/25/16  Yes Hongalgi, Lenis Dickinson, MD  LYRICA 50 MG capsule Take 50 mg by mouth daily as needed (pain).  06/21/16  Yes [provider]  nitroGLYCERIN (NITROSTAT) 0.4 MG SL tablet Place 1 tablet (0.4 mg total) under the tongue every 5 (five) minutes x 3 doses as needed for chest pain. 10/15/14  Yes Isaiah Serge, NP  Omega-3 Fatty Acids (FISH OIL PO) Take 1 capsule by mouth daily.   Yes [provider]  vitamin B-12 (CYANOCOBALAMIN)  1000 MCG tablet Take 1,000 mcg by mouth every evening.    Yes [provider]    Family History Family History  Problem Relation Age of Onset  . Hypertension Mother   . Heart disease Mother   . Heart disease Father   . Heart failure Brother   . Heart attack Neg Hx   . Stroke Neg Hx     Social History Social History   Tobacco Use  . Smoking status: Never Smoker  . Smokeless tobacco: Never Used  Substance Use Topics  . Alcohol use: Yes    Alcohol/week: 4.2 oz    Types: 7 Glasses of wine per week    Comment: wine -  daily - currently not drinking  . Drug use: No     Allergies   Patient has no known allergies.   Review of Systems Review of Systems  Constitutional: Negative for chills and fever.  Eyes: Negative for visual disturbance.  Respiratory: Negative for cough and shortness of breath.   Cardiovascular: Positive for chest pain.  Gastrointestinal: Negative for abdominal pain, nausea and vomiting.  Genitourinary: Negative for dysuria and hematuria.  Musculoskeletal: Negative for back pain and neck pain.  Neurological: Negative for dizziness, weakness, numbness and headaches.     Physical Exam Updated Vital Signs BP 133/68   Pulse (!) 59   Temp (!) 97.4 F (36.3 C) (Oral)   Resp (!) 23   Ht 5\' 6"  (1.676 m)   Wt 71.7 kg (158 lb)   SpO2 99%   BMI 25.50 kg/m   Physical Exam  Constitutional: He is oriented to person, place, and time. He appears well-developed and well-nourished.  Sitting comfortably on examination table  HENT:  Head: Normocephalic and atraumatic.  Mouth/Throat: Oropharynx is clear and moist and mucous membranes are normal.  Eyes: Conjunctivae, EOM and lids are normal. Pupils are equal, round, and reactive to light.  Neck: Full passive range of motion without pain.  Cardiovascular: Normal rate, regular rhythm, normal heart sounds and normal pulses. Exam reveals no gallop and no friction rub.  No murmur heard. Pulses:      Radial  pulses are 2+ on the right side, and 2+ on the left side.       Dorsalis pedis pulses are 2+ on the right side, and 2+ on the left side.  Pulmonary/Chest: Effort normal. He has decreased breath sounds in the left lower field.  Abdominal: Soft. Normal appearance. He exhibits no distension. There is tenderness in the suprapubic area. There is no rigidity, no guarding, no CVA tenderness and no tenderness at McBurney's point.  Abdomen is soft, non-distended. Very minimal diffuse lower abdominal tenderness. No rigidity, no guarding. No peritoneal signs.   Musculoskeletal: Normal range of motion.  Neurological: He is alert and oriented to person, place, and time.  Follows commands, Moves all extremities  5/5 strength to BUE and BLE  Sensation intact throughout all major nerve distributions  Skin: Skin is warm and dry. Capillary refill takes less than 2 seconds.  Psychiatric: He has a normal mood and affect. His speech is normal.  Nursing note and vitals reviewed.    ED Treatments / Results  Labs (all labs ordered are listed, but only abnormal results are displayed) Labs Reviewed  COMPREHENSIVE METABOLIC PANEL - Abnormal; Notable for the following components:      Result Value   Glucose, Bld 109 (*)    BUN 34 (*)    Creatinine, Ser 1.98 (*)    Total Protein 6.1 (*)    Albumin 3.2 (*)    ALT 10 (*)    Total Bilirubin 0.2 (*)    GFR calc non Af Amer 28 (*)    GFR calc Af Amer 32 (*)    All other components within normal limits  LIPASE, BLOOD  CBC WITH DIFFERENTIAL/PLATELET  URINALYSIS, ROUTINE W REFLEX MICROSCOPIC  I-STAT TROPONIN, ED  I-STAT TROPONIN, ED    EKG  EKG Interpretation  Date/Time:  Wednesday April 09 2017 13:57:31 EST Ventricular Rate:  69 PR Interval:    QRS Duration: 80 QT Interval:  389 QTC Calculation: 417 R Axis:   88 Text Interpretation:  Sinus rhythm Borderline right axis deviation  Nonspecific T abnormalities, lateral leads No significant change was  found Confirmed by Jola Schmidt [id number]) on 04/09/2017 3:12:47 PM       Radiology Dg Chest 2 View  Result Date: 04/09/2017 CLINICAL DATA:  Pt currently complains of SOB and CP stating that there is "fluid in his lungs" EXAM: CHEST  2 VIEW COMPARISON:  03/28/2017 FINDINGS: Sternotomy wires overlie normal cardiac silhouette. There is a large hiatal hernia. Small LEFT effusion. RIGHT lung is clear. IMPRESSION: 1. New LEFT pleural effusion. 2. Large hiatal hernia posterior to the heart with air-fluid level. 3. Cardiomegaly. Electronically Signed   By: Suzy Bouchard M.D.   On: 04/09/2017 14:42    Procedures Procedures (including critical care time)  Medications Ordered in ED Medications - No data to display   Initial Impression / Assessment and Plan / ED Course  I have reviewed the triage vital signs and the nursing notes.  Pertinent labs & imaging results that were available during my care of the patient were reviewed by me and considered in my medical decision making (see chart for details).     83 y.o. M with PMH/o CAD, MI, GERD, hyperlipidemia who presents for evaluation of bilateral shoulder pain that began after breakfast. Not worse with deep inspiration. Had some SOB and fatigue with symptoms. No nausea/diaphoresis. Patient is afebrile, non-toxic appearing, sitting comfortably on examination table. Vital signs reviewed and stable. Physical exam shows some lower abdominal tenderness.  Consider ACS etiology, though patient's story is atypical, versus acute infectious etiology versus musculoskeletal pain. History/physical exam is not concerning for appendicitis, diverticulitis, abdominal perforation. Will plan to check basic labs including CBC, CMP, Lipase, UA, Trop, EKG, CXR.   Labs and imaging reviewed.  Troponin is negative.  Lipase is unremarkable.  UA is negative for any acute signs of infection.  CBC unremarkable.  CMP shows elevation of BUN and creatinine.  Records reviewed to  this consistent with patient's previous.  EKG shows sinus rhythm rate 69.  There are some nonspecific T wave abnormalities.  Appears consistent with previous EKG done on 03/03/17.  X-ray shows a small left-sided pleural effusion. Patient has a history of known malignant pleural effusion and is followed by Dr. Irene Limbo. He was recently seen by Dr. Irene Limbo last week to have the effusion drained. Discussed patient with Dr. Venora Maples. Will plan to repeat troponin. If negative, patient can be discharged home.   Updated patient on plan.  Reports that he has had no pain. Denies any CP, Abdominal Pain. Repeat abdominal exam is benign. Patient is tolerating PO in the department without any difficulty.  Denies any difficulty breathing.  Vitals are stable.  Patient signed out to Melina Schools, PA-C with delta troponin. Please see his note for further ED course. If troponin is negative, plan to discharge patient home with outpatient cardiology follow-up.  Instructed patient to follow-up with Dr. Irene Limbo for effusion.    Final Clinical Impressions(s) / ED Diagnoses   Final diagnoses:  Acute pain of both shoulders  Chest pain, unspecified type    ED Discharge Orders    None       Desma Mcgregor 04/10/17 0801    Jola Schmidt, MD 04/14/17 [phone]

## 2017-04-09 NOTE — ED Notes (Signed)
Got patient undress on the monitor did ekg shown top Dr Venora Maples patient is resting with call bell in reach

## 2017-04-09 NOTE — ED Triage Notes (Signed)
Pt arrives via EMS from [facility] with bilateral should pain with no radiation. Pt endorses some SOB and fatigue with the pain. Pt has hx of MI and Afib. Pt took 2 ASA at home and EMS reports given 2 ASA in route. Upon arrival, pt denies shoulder pain and SOB at this time.

## 2017-04-09 NOTE — ED Provider Notes (Signed)
Care assumed from previous provider PA Layden. Please see their note for further details to include full history and physical. Case discussed, plan agreed upon.   At time of care handout was awaiting repeat troponin and EKG.  These have returned and are normal.  I discussed with patient these findings.  Patient will follow-up in the outpatient setting for the pleural effusion and with cardiology.  Pt is hemodynamically stable, in NAD, & able to ambulate in the ED. Evaluation does not show pathology that would require ongoing emergent intervention or inpatient treatment. I explained the diagnosis to the patient. Pain has been managed & has no complaints prior to dc. Pt is comfortable with above plan and is stable for discharge at this time. All questions were answered prior to disposition. Strict return precautions for f/u to the ED were discussed. Encouraged follow up with PCP.        Doristine Devoid, PA-C 04/09/17 1813    Jola Schmidt, MD 04/12/17 [phone]

## 2017-04-09 NOTE — Discharge Instructions (Signed)
Follow-up with Dr. Irene Limbo regarding your pleural effusion.   Follow-up with your cardiologist in the next few days.  Return to the Emergency Department for any worsening pain, difficulty breathing, abdominal pain, nausea/vomiting.

## 2017-04-09 NOTE — ED Notes (Signed)
Patient transported to X-ray 

## 2017-04-15 DIAGNOSIS — C3490 Malignant neoplasm of unspecified part of unspecified bronchus or lung: Secondary | ICD-10-CM | POA: Diagnosis not present

## 2017-04-15 DIAGNOSIS — R079 Chest pain, unspecified: Secondary | ICD-10-CM | POA: Diagnosis not present

## 2017-04-15 DIAGNOSIS — J9801 Acute bronchospasm: Secondary | ICD-10-CM | POA: Diagnosis not present

## 2017-04-15 DIAGNOSIS — I10 Essential (primary) hypertension: Secondary | ICD-10-CM | POA: Diagnosis not present

## 2017-04-17 ENCOUNTER — Ambulatory Visit (HOSPITAL_BASED_OUTPATIENT_CLINIC_OR_DEPARTMENT_OTHER): Payer: Medicare Other | Admitting: Hematology

## 2017-04-17 ENCOUNTER — Telehealth: Payer: Self-pay | Admitting: Hematology

## 2017-04-17 VITALS — BP 177/69 | HR 62 | Temp 97.6°F | Resp 18 | Ht 66.0 in | Wt 161.9 lb

## 2017-04-17 DIAGNOSIS — J910 Malignant pleural effusion: Secondary | ICD-10-CM

## 2017-04-17 DIAGNOSIS — C3412 Malignant neoplasm of upper lobe, left bronchus or lung: Secondary | ICD-10-CM | POA: Diagnosis not present

## 2017-04-17 DIAGNOSIS — N183 Chronic kidney disease, stage 3 (moderate): Secondary | ICD-10-CM

## 2017-04-17 DIAGNOSIS — C3492 Malignant neoplasm of unspecified part of left bronchus or lung: Secondary | ICD-10-CM

## 2017-04-17 NOTE — Telephone Encounter (Signed)
Gave avs and calendar for december °

## 2017-04-17 NOTE — Progress Notes (Signed)
START ON PATHWAY REGIMEN - Non-Small Cell Lung  Other Clinical Trial: nivolumab  Patient Characteristics: Stage IV Metastatic, Nonsquamous, Inappropriate Line Of Therapy - Not Appropriate AJCC T Category: TX Current Disease Status: Distant Metastases AJCC N Category: NX AJCC M Category: M1a AJCC 8 Stage Grouping: IVA Histology: Nonsquamous Cell ROS1 Rearrangement Status: Awaiting Test Results T790M Mutation Status: Not Applicable - EGFR Mutation Negative/Unknown Other Mutations/Biomarkers: No Other Actionable Mutations PD-L1 Expression Status: Awaiting Test Results Chemotherapy/Immunotherapy LOT: Not Appropriate Molecular Targeted Therapy: Not Appropriate ALK Translocation Status: Awaiting Test Results Would you be surprised if this patient died  in the next year<= I would NOT be surprised if this patient died in the next year EGFR Mutation Status: Awaiting Test Results BRAF V600E Mutation Status: Awaiting Test Results Intent of Therapy: Non-Curative / Palliative Intent, Discussed with Patient

## 2017-04-17 NOTE — Progress Notes (Addendum)
ALERT: Recent Pathways Treatment decision is outdated. Please await next Pathways decision 

## 2017-04-17 NOTE — Progress Notes (Signed)
HEMATOLOGY/ONCOLOGY CLINIC NOTE  Date of Service: 04/17/2017  Patient Care Team: Josetta Huddle, MD as PCP - General (Internal Medicine)  CHIEF COMPLAINTS/PURPOSE OF CONSULTATION:  Left upper lobe lung mass  HISTORY OF PRESENTING ILLNESS:   Scott [name] is a wonderful 83 y.o. male who has been referred to [place] by Dr. Josetta Huddle, MD for evaluation of suspected lung cancer.   He has a hx of CAD, CKD, Diastolic CHF, HTN, HLD. He presented to the ED on 03/03/2017 for SOB and was found to have pleural effusion. He was also to have a mass in the upper lobe of left lung after a CT chest Wo contrast with results showing:IMPRESSION:1. 2.4 x 3.6 cm apparent mass in the posterior left upper lobe with associated left upper lobe interlobular septal thickening. Imaging features concerning for neoplasm with lymphangitic tumor spread. 2. Small left pleural effusion. 3. Large hiatal hernia contains nearly the entire stomach with evidence of organoaxial volvulus. No evidence for gastric obstruction. 4. 19 mm right adrenal adenoma.  He is accompanied by his wife and daughter. He states that he began feeling SOB 2 weeks ago. He is not a smoker and has not smoked in the past. He states that he has not been evaluated by his cardiologist, Dr. Larae Grooms. For his recent SOB.  On review of systems, patient reports fatigue, intermittent SOB, and denies fever, chills, CP, cough, weight loss, and any other accompanying symptoms.  INTERVAL HISTORY:   Pt presents to the office today for follow up  to discuss treatment options for his newly diagnosed metastatic lung adenocarcinoma. Unfortunately due to the thanksgiving week his foundation One results are still pending. We had an extensive discussion of treatment options including best supportive cares and different systemic palliative treatment. He has not had any prohibitive issues with chest pain or shortness of breath.  Met with Radiation Oncology -  no specific treatment indication at this time.  On review of systems, pt denies fever, chills, rash, mouth sores, weight loss, decreased appetite, urinary complaints. Denies pain. Pt denies abdominal pain, nausea, vomiting.     MEDICAL HISTORY:  Past Medical History:  Diagnosis Date  . Anxiety   . Bradycardia    a. BB d/c'd in 2016.  [name] CAD (coronary artery disease)    a. 1983 s/p CABG [facility] [place]);  b. 2007 NSTEMI->med Rx for distal LCX dzs; c. 09/2014 NSTEMI/Cath: LIMA->LAD & diag ok, native RCA dominant and patent.   VG->RCA 100, VG->LCX 100 -->Med Rx.  . CKD (chronic kidney disease), stage III ([place])   . Claudication ([place])    BLE  . Depression   . Diastolic dysfunction    a. 10/2014 Echo: F 60-65%, Gr1 DD, mild AS, mild AI, mild to mod MR, PASP 65mHg.  .Marland KitchenDyspnea 2010   syndrome - extensive  . Erectile dysfunction   . GERD (gastroesophageal reflux disease)   . H/O hiatal hernia   . History of blood transfusion 2014  . Hx of adenomatous colonic polyps 2007  . Hyperlipidemia   . Hypertensive heart disease   . Inguinal hernia   . Iron deficiency anemia   . Shingles 1990's  . Sigmoid diverticulosis   . Upper GI bleeding 2014   Gastritis and possible duodenal ulcer seen on EGD, felt secondary to NSAIDs  . Urethral stricture 11/2008  . Vasovagal syncope 05/2006  . Weight loss     SURGICAL HISTORY: Past Surgical History:  Procedure Laterality Date  . CARDIAC  CATHETERIZATION  2007   Left main 100%, RCA 70% ostial, all grafts were patent, EF 40-45 percent, distal circumflex subtotal after graft insertion, medical therapy   . CARDIAC CATHETERIZATION N/A 10/14/2014   Procedure: Left Heart Cath and Cors/Grafts Angiography;  Surgeon: Lorretta Harp, MD;  Location: [facility];  Service: Cardiovascular;  Laterality: N/A;  . CHOLECYSTECTOMY  1980's  . CORONARY ARTERY BYPASS GRAFT  1994    LIMA-LAD, SVG-D1, SVG-OM,  SVG-RCA; performed in [place], [place]  .  ESOPHAGOGASTRODUODENOSCOPY N/A 03/25/2013   Procedure: ESOPHAGOGASTRODUODENOSCOPY (EGD);  Surgeon: Missy Sabins, MD;  gastritis, cannot rule out duodenal ulcer   . INGUINAL HERNIA REPAIR Left   . TRANSURETHRAL RESECTION OF PROSTATE      SOCIAL HISTORY: Social History   Socioeconomic History  . Marital status: Married    Spouse name: Not on file  . Number of children: Not on file  . Years of education: Not on file  . Highest education level: Not on file  Social Needs  . Financial resource strain: Not on file  . Food insecurity - worry: Not on file  . Food insecurity - inability: Not on file  . Transportation needs - medical: Not on file  . Transportation needs - non-medical: Not on file  Occupational History  . Occupation: Retired  Tobacco Use  . Smoking status: Never Smoker  . Smokeless tobacco: Never Used  Substance and Sexual Activity  . Alcohol use: Yes    Alcohol/week: 4.2 oz    Types: 7 Glasses of wine per week    Comment: wine - daily - currently not drinking  . Drug use: No  . Sexual activity: No    Birth control/protection: Abstinence  Other Topics Concern  . Not on file  Social History Narrative   He lives in [facility], independent living, with his wife.    FAMILY HISTORY: Family History  Problem Relation Age of Onset  . Hypertension Mother   . Heart disease Mother   . Heart disease Father   . Heart failure Brother   . Heart attack Neg Hx   . Stroke Neg Hx     ALLERGIES:  has No Known Allergies.  MEDICATIONS:  Current Outpatient Medications  Medication Sig Dispense Refill  . acetaminophen (TYLENOL) 325 MG tablet Take 2 tablets (650 mg total) by mouth every 4 (four) hours as needed for headache or mild pain.    [name] albuterol (PROAIR HFA) 108 (90 Base) MCG/ACT inhaler Inhale 2 puffs into the lungs every 6 (six) hours as needed for wheezing or shortness of breath.    [name] aspirin EC 81 MG tablet Take 81 mg by mouth daily as needed for moderate pain.     [name]  BREO ELLIPTA 100-25 MCG/INH AEPB 1 PUFF BY MOUTH DAILY  11  . buPROPion (WELLBUTRIN XL) 150 MG 24 hr tablet Take 150 mg by mouth daily.     [name] desvenlafaxine (PRISTIQ) 50 MG 24 hr tablet Take 25 mg by mouth daily.     [name] esomeprazole (NEXIUM) 40 MG capsule Take 40 mg by mouth daily as needed (acid reflux/ heartburn).    . FERROCITE 324 MG TABS tablet TAKE 1 TABLET BY MOUTH QOD  11  . isosorbide mononitrate (IMDUR) 30 MG 24 hr tablet Take 30 mg by mouth daily.    [name] lisinopril (PRINIVIL,ZESTRIL) 10 MG tablet Take 2 tablets (20 mg total) by mouth daily. 60 tablet 0  . LYRICA 50 MG capsule Take 50  mg by mouth daily as needed (pain).     . nitroGLYCERIN (NITROSTAT) 0.4 MG SL tablet Place 1 tablet (0.4 mg total) under the tongue every 5 (five) minutes x 3 doses as needed for chest pain. 25 tablet 4  . Omega-3 Fatty Acids (FISH OIL PO) Take 1 capsule by mouth daily.    . vitamin B-12 (CYANOCOBALAMIN) 1000 MCG tablet Take 1,000 mcg by mouth every evening.      No current facility-administered medications for this visit.     REVIEW OF SYSTEMS:    10 Point review of Systems was done is negative except as noted above.  PHYSICAL EXAMINATION:  ECOG PERFORMANCE STATUS: 2 - Symptomatic, <50% confined to bed  . Vitals:   04/17/17 1458  BP: (!) 177/69  Pulse: 62  Resp: 18  Temp: 97.6 F (36.4 C)  SpO2: 98%   Filed Weights   04/17/17 1458  Weight: 161 lb 14.4 oz (73.4 kg)   .Body mass index is 26.13 kg/m.  GENERAL:alert, in no acute distress and comfortable SKIN: no acute rashes, no significant lesions EYES: conjunctiva are pink and non-injected, sclera anicteric OROPHARYNX: MMM, no exudates, no oropharyngeal erythema or ulceration NECK: supple, no JVD LYMPH:  no palpable lymphadenopathy in the cervical, axillary or inguinal regions LUNGS: clear to auscultation b/l with normal respiratory effort HEART: regular rate & rhythm ABDOMEN:  normoactive bowel sounds , non tender, not  distended. Extremity: no pedal edema PSYCH: alert & oriented x 3 with fluent speech NEURO: no focal motor/sensory deficits  LABORATORY DATA:  I have reviewed the data as listed  . CBC Latest Ref Rng & Units 04/09/2017 03/03/2017 01/24/2016  WBC 4.0 - 10.5 K/uL 9.0 8.6 7.9  Hemoglobin 13.0 - 17.0 g/dL 13.2 12.9(L) 12.9(L)  Hematocrit 39.0 - 52.0 % 41.0 40.3 40.2  Platelets 150 - 400 K/uL 201 173 139(L)    . CMP Latest Ref Rng & Units 04/09/2017 03/03/2017 01/24/2016  Glucose 65 - 99 mg/dL 109(H) 95 99  BUN 6 - 20 mg/dL 34(H) 41(H) 25(H)  Creatinine 0.61 - 1.24 mg/dL 1.98(H) 2.10(H) 1.69(H)  Sodium 135 - 145 mmol/L 139 137 140  Potassium 3.5 - 5.1 mmol/L 4.5 5.3(H) 4.0  Chloride 101 - 111 mmol/L 106 107 105  CO2 22 - 32 mmol/L 26 23 22   Calcium 8.9 - 10.3 mg/dL 8.9 9.1 8.8(L)  Total Protein 6.5 - 8.1 g/dL 6.1(L) 6.8 -  Total Bilirubin 0.3 - 1.2 mg/dL 0.2(L) 0.5 -  Alkaline Phos 38 - 126 U/L 69 57 -  AST 15 - 41 U/L 20 16 -  ALT 17 - 63 U/L 10(L) 14(L) -     RADIOGRAPHIC STUDIES: I have personally reviewed the radiological images as listed and agreed with the findings in the report. Dg Chest 1 View  Result Date: 03/28/2017 CLINICAL DATA:  Post left thoracentesis.  1 L of fluid removed. EXAM: CHEST 1 VIEW COMPARISON:  03/03/2017 and CT 03/03/2017 FINDINGS: Examination demonstrates significant improvement in the previously noted moderate size left pleural effusion. Small amount of residual left pleural fluid and likely mild left basilar atelectasis. No pneumothorax. Mild stable cardiomegaly. Remainder of the exam is unchanged to include moderate size hiatal hernia. IMPRESSION: Significant decrease in size of patient's left pleural effusion with small amount of residual left pleural fluid. No pneumothorax. Mild stable cardiomegaly. Electronically Signed   By: Marin Olp M.D.   On: 03/28/2017 11:00   Dg Chest 2 View  Result Date: 04/09/2017 CLINICAL DATA:  Pt currently complains of  SOB and CP stating that there is "fluid in his lungs" EXAM: CHEST  2 VIEW COMPARISON:  03/28/2017 FINDINGS: Sternotomy wires overlie normal cardiac silhouette. There is a large hiatal hernia. Small LEFT effusion. RIGHT lung is clear. IMPRESSION: 1. New LEFT pleural effusion. 2. Large hiatal hernia posterior to the heart with air-fluid level. 3. Cardiomegaly. Electronically Signed   By: Suzy Bouchard M.D.   On: 04/09/2017 14:42   Mr Brain Wo Contrast  Result Date: 03/25/2017 CLINICAL DATA:  Lung cancer.  Staging EXAM: MRI HEAD WITHOUT CONTRAST TECHNIQUE: Multiplanar, multiecho pulse sequences of the brain and surrounding structures were obtained without intravenous contrast. COMPARISON:  CT head 01/22/2016 FINDINGS: Brain: Intravenous contrast not administered due to renal insufficiency. This limits sensitivity of the examination for metastatic disease. Moderate to advanced generalized atrophy. Scattered small white matter hyperintensities throughout the white matter brainstem. Pattern most consistent with chronic microvascular ischemia. Chronic infarct in the right thalamus. Negative for acute infarct. Negative for hemorrhage or edema. Negative for mass lesion. Chronic microhemorrhage right inferior cerebellum. No other areas of hemorrhage Vascular: Normal arterial flow voids Skull and upper cervical spine: Negative Sinuses/Orbits: Mucosal edema paranasal sinuses with air-fluid level in the sphenoid sinus. Other: None IMPRESSION: Moderate to advanced atrophy. Moderate chronic microvascular ischemic change in the white matter. No acute infarct Negative for mass lesion. Note that small metastatic deposits can be missed without intravenous contrast. Electronically Signed   By: Franchot Gallo M.D.   On: 03/25/2017 10:06   US Thoracentesis Asp Pleural Space W/img Guide  Result Date: 03/28/2017 INDICATION: Possible Pulmonary malignancy with left-sided pleural effusion. Request is made for diagnostic and  therapeutic thoracentesis. EXAM: ULTRASOUND GUIDED DIAGNOSTIC AND THERAPEUTIC THORACENTESIS MEDICATIONS: 2% lidocaine COMPLICATIONS: None immediate. PROCEDURE: An ultrasound guided thoracentesis was thoroughly discussed with the patient and questions answered. The benefits, risks, alternatives and complications were also discussed. The patient understands and wishes to proceed with the procedure. Written consent was obtained. Ultrasound was performed to localize and mark an adequate pocket of fluid in the left chest. The area was then prepped and draped in the normal sterile fashion. 2% Lidocaine was used for local anesthesia. Under ultrasound guidance a Safe-T-Centesis catheter was introduced. Thoracentesis was performed. The catheter was removed and a dressing applied. FINDINGS: A total of approximately 1 L of serous fluid was removed. Samples were sent to the laboratory as requested by the clinical team. IMPRESSION: Successful ultrasound guided left thoracentesis yielding 1 L of pleural fluid. Read by: Saverio Danker, PA-C Electronically Signed   By: Corrie Mckusick D.O.   On: 03/28/2017 11:14    ASSESSMENT & PLAN:   Scott [name] is a wonderful 83 y.o. male with left upper lobe mass.  1) Newly diagnosed Metastatic Left lung Adenocarcinoma involving LUL lung mass left lung with associated moderated malignant left pleural effusion and left hilar LNadenopathy.  PLAN --I discussed in detail with pt and his family all the available treatment options . We discussed options for best supportive care through hospice vs palliative systemic treatment. -the patient wants to maintain his QOL as much as possible and is not keen on considering cytotoxic chemotherapy  -we decided that if he has a targetable mutation on foundation One we will treat him accordingly. -if no targetable mutation we will plan to proceed with immunotherapy with Nivolumab. -continued attention to maintaining physical activity level and  nutrition. -will consider rpt therapeutic thoracentesis if symptomatic. -patient aware treatments would be palliative  and not curative.  Chemo-counseling for Nivolumab in 4-5 days Schedule for Nivolumab q2weeks - to start in 10-12 days with labs RTC with Dr Irene Limbo in 7 days after 1st dose of Nivolumab with labs for toxicity check  All of the patients questions were answered with apparent satisfaction. The patient knows to call the clinic with any problems, questions or concerns.  I spent 20 minutes counseling the patient face to face. The total time spent in the appointment was 25 minutes and more than 50% was on counseling and direct patient cares.  I have reviewed the above documentation for accuracy and completeness, and I agree with the above.  Sullivan Lone MD [place] AAHIVMS [facility] Hematology/Oncology Physician [facility]  (Office):       [phone] (Work cell):  [phone] (Fax):           [phone]  04/17/2017 3:09 PM   This document serves as a record of services personally performed by Sullivan Lone, MD. It was created on his behalf by Reola Mosher, a trained medical scribe. The creation of this record is based on the scribe's personal observations and the provider's statements to them.   .I have reviewed the above documentation for accuracy and completeness, and I agree with the above. Brunetta Genera MD MS

## 2017-04-22 ENCOUNTER — Other Ambulatory Visit: Payer: Medicare Other

## 2017-04-22 ENCOUNTER — Encounter: Payer: Self-pay | Admitting: *Deleted

## 2017-04-22 DIAGNOSIS — C3492 Malignant neoplasm of unspecified part of left bronchus or lung: Secondary | ICD-10-CM | POA: Diagnosis not present

## 2017-04-25 ENCOUNTER — Other Ambulatory Visit: Payer: Self-pay

## 2017-04-25 DIAGNOSIS — C3492 Malignant neoplasm of unspecified part of left bronchus or lung: Secondary | ICD-10-CM

## 2017-04-28 ENCOUNTER — Other Ambulatory Visit: Payer: Medicare Other

## 2017-04-28 ENCOUNTER — Ambulatory Visit: Payer: Medicare Other

## 2017-04-29 ENCOUNTER — Telehealth: Payer: Self-pay | Admitting: Hematology

## 2017-04-29 NOTE — Telephone Encounter (Signed)
Lab/tx for 12/10 delayed due to weather and center closing. Appointments adjusted. Spoke with wife re new appointments 12/13 and 12/21.

## 2017-04-30 ENCOUNTER — Encounter (HOSPITAL_COMMUNITY): Payer: Self-pay

## 2017-05-01 ENCOUNTER — Telehealth: Payer: Self-pay | Admitting: Pharmacy Technician

## 2017-05-01 ENCOUNTER — Other Ambulatory Visit: Payer: Self-pay | Admitting: Hematology

## 2017-05-01 ENCOUNTER — Telehealth: Payer: Self-pay | Admitting: Pharmacist

## 2017-05-01 ENCOUNTER — Ambulatory Visit (HOSPITAL_BASED_OUTPATIENT_CLINIC_OR_DEPARTMENT_OTHER): Payer: Medicare Other | Admitting: Hematology

## 2017-05-01 ENCOUNTER — Ambulatory Visit: Payer: Medicare Other

## 2017-05-01 ENCOUNTER — Other Ambulatory Visit (HOSPITAL_BASED_OUTPATIENT_CLINIC_OR_DEPARTMENT_OTHER): Payer: Medicare Other

## 2017-05-01 VITALS — Wt 158.5 lb

## 2017-05-01 DIAGNOSIS — J910 Malignant pleural effusion: Secondary | ICD-10-CM | POA: Diagnosis not present

## 2017-05-01 DIAGNOSIS — C3492 Malignant neoplasm of unspecified part of left bronchus or lung: Secondary | ICD-10-CM

## 2017-05-01 DIAGNOSIS — I25810 Atherosclerosis of coronary artery bypass graft(s) without angina pectoris: Secondary | ICD-10-CM

## 2017-05-01 DIAGNOSIS — C3412 Malignant neoplasm of upper lobe, left bronchus or lung: Secondary | ICD-10-CM

## 2017-05-01 DIAGNOSIS — C3490 Malignant neoplasm of unspecified part of unspecified bronchus or lung: Secondary | ICD-10-CM

## 2017-05-01 DIAGNOSIS — Z1509 Genetic susceptibility to other malignant neoplasm: Secondary | ICD-10-CM

## 2017-05-01 LAB — COMPREHENSIVE METABOLIC PANEL
ALT: 7 U/L (ref 0–55)
AST: 13 U/L (ref 5–34)
Albumin: 3.7 g/dL (ref 3.5–5.0)
Alkaline Phosphatase: 75 U/L (ref 40–150)
Anion Gap: 11 mEq/L (ref 3–11)
BUN: 34.9 mg/dL — ABNORMAL HIGH (ref 7.0–26.0)
CO2: 24 meq/L (ref 22–29)
Calcium: 9.8 mg/dL (ref 8.4–10.4)
Chloride: 104 mEq/L (ref 98–109)
Creatinine: 2.3 mg/dL — ABNORMAL HIGH (ref 0.7–1.3)
EGFR: 24 mL/min/{1.73_m2} — AB (ref >60)
GLUCOSE: 90 mg/dL (ref 70–140)
POTASSIUM: 4.7 meq/L (ref 3.5–5.1)
SODIUM: 140 meq/L (ref 136–145)
TOTAL PROTEIN: 7.2 g/dL (ref 6.4–8.3)
Total Bilirubin: 0.42 mg/dL (ref 0.20–1.20)

## 2017-05-01 LAB — CBC WITH DIFFERENTIAL/PLATELET
BASO%: 0.5 % (ref 0.0–2.0)
BASOS ABS: 0 10*3/uL (ref 0.0–0.1)
EOS%: 3.5 % (ref 0.0–7.0)
Eosinophils Absolute: 0.3 10*3/uL (ref 0.0–0.5)
HEMATOCRIT: 43.9 % (ref 38.4–49.9)
HGB: 14 g/dL (ref 13.0–17.1)
LYMPH#: 1.9 10*3/uL (ref 0.9–3.3)
LYMPH%: 21.3 % (ref 14.0–49.0)
MCH: 28.2 pg (ref 27.2–33.4)
MCHC: 31.9 g/dL — AB (ref 32.0–36.0)
MCV: 88.2 fL (ref 79.3–98.0)
MONO#: 0.6 10*3/uL (ref 0.1–0.9)
MONO%: 7.1 % (ref 0.0–14.0)
NEUT#: 6 10*3/uL (ref 1.5–6.5)
NEUT%: 67.6 % (ref 39.0–75.0)
PLATELETS: 193 10*3/uL (ref 140–400)
RBC: 4.97 10*6/uL (ref 4.20–5.82)
RDW: 14.2 % (ref 11.0–14.6)
WBC: 8.8 10*3/uL (ref 4.0–10.3)

## 2017-05-01 MED ORDER — OSIMERTINIB MESYLATE 80 MG PO TABS: 80.0000 mg | ORAL_TABLET | Freq: Every day | ORAL | 2 refills | Status: DC

## 2017-05-01 MED ORDER — ERLOTINIB HCL 150 MG PO TABS: 150.0000 mg | ORAL_TABLET | Freq: Every day | ORAL | 2 refills | Status: DC

## 2017-05-01 NOTE — Progress Notes (Signed)
Ok to treat with Scr 2.3 per Dr. Irene Limbo.

## 2017-05-01 NOTE — Telephone Encounter (Signed)
Oral Oncology Pharmacist Encounter  Received new prescription for Tarceva (erlotinib) for the treatment of newly diagnosed, EGFR mutation positive (exon 19 deletion), advanced NSCLC, planned duration until disease progression or unacceptable toxocity. Discussed treatment plan with MD. Will start [place] (osimertinib) instead of Tarceva.  Labs from 05/01/17 assessed, OK for treatment. Noted SCr=2.3, est CrCl~20 mL/min, no dose adjustments provided by manufacturer 04/14/17 EKG shows QTc 405 msec, OK for treatment initiation, this is planned to be repeated on 05/09/17  Current medication list in Epic reviewed, no significant DDIs with Tagrisso identified.  Prescription has been e-scribed to the [facility] for benefits analysis and approval. Prior authorization has been submitted. We will follow-up with patient once copayment is known.  Oral Oncology Clinic will continue to follow for insurance authorization, copayment issues, initial counseling and start date.  Johny Drilling, PharmD, BCPS, BCOP 05/01/2017 4:13 PM Oral Oncology Clinic [phone]

## 2017-05-01 NOTE — Progress Notes (Signed)
Per Delle Reining desk RN Dr Irene Limbo ok to tx today with creatinine 2.3 today.

## 2017-05-01 NOTE — Progress Notes (Signed)
No treatment today per MD, pt to see MD to review Foundation one results.

## 2017-05-01 NOTE — Telephone Encounter (Signed)
Oral Oncology Patient Advocate Encounter  Received notification from [facility] that prior authorization for Tagrisso is required.  PA submitted on CoverMyMeds Key [id number] Status is pending  Oral Oncology Clinic will continue to follow.  Scott [name]. Scott [name], Scott [name] Patient [place] [phone] 05/01/2017 4:27 PM

## 2017-05-02 MED FILL — TAGRISSO 80 MG TABLET: 80 | 30 days supply | Qty: 30 | Fill #0

## 2017-05-02 NOTE — Telephone Encounter (Signed)
Oral Oncology Patient Advocate Encounter  Prior Authorization for Newman Nip has been approved.    PA# [id number] Effective dates: 05/01/2017 through 05/02/2017  Oral Oncology Clinic will continue to follow.   Fabio Asa. Melynda Keller, [place] Patient [place] [phone] 05/02/2017 8:12 AM

## 2017-05-02 NOTE — Telephone Encounter (Signed)
Oral Chemotherapy Pharmacist Encounter   I spoke with patient's wife, Scott [name], for overview of: Tagrisso.   Pt is doing well. Counseled on administration, dosing, side effects, monitoring, drug-food interactions, safe handling, storage, and disposal.  Patient will take Tagrisso 80 tablets, 1 tablet by mouth once daily, without regard to food. Tagrisso start date: ~05/03/17  Side effects include but not limited to: diarrhea, mouth sores, decreased appetitie, fatigue, dry skin, rash, nail changes, altered cardiac conduction, and decreased blood counts or electrolytes. Patient will get some loperamide in case diarrhea develops, and will call the office if experiencing side effects.   Reviewed with patient importance of keeping a medication schedule and plan for any missed doses.  [name] voiced understanding and appreciation.   All questions answered. Medication reconciliation performed and medication/allergy list updated.  Tagrisso will be ready for pick-up this afternoon at the [facility] today (05/02/17) for copayment $44.11. This is affordable to patient at this time. If they pick up the [place] today, patient plans to start it tomorrow morning.   We will see patient back in the office on 12/21 at noon for repeat EKG.  Patient knows to call the office with questions or concerns. Oral Oncology Clinic will continue to follow.  Thank you,  Johny Drilling, PharmD, BCPS, BCOP 05/02/2017   3:35 PM Oral Oncology Clinic [phone]

## 2017-05-03 ENCOUNTER — Telehealth: Payer: Self-pay | Admitting: Hematology

## 2017-05-03 NOTE — Telephone Encounter (Signed)
Scheduled appt per 12/13 sch message - sent reminder letter in the mail with appt date and time.

## 2017-05-05 ENCOUNTER — Ambulatory Visit: Payer: Medicare Other | Admitting: Hematology

## 2017-05-05 ENCOUNTER — Other Ambulatory Visit: Payer: Medicare Other

## 2017-05-08 ENCOUNTER — Ambulatory Visit: Payer: Medicare Other | Admitting: Hematology

## 2017-05-09 ENCOUNTER — Other Ambulatory Visit: Payer: Medicare Other

## 2017-05-09 ENCOUNTER — Ambulatory Visit: Payer: Medicare Other | Admitting: Hematology

## 2017-05-12 ENCOUNTER — Other Ambulatory Visit: Payer: Medicare Other

## 2017-05-12 ENCOUNTER — Ambulatory Visit: Payer: Medicare Other

## 2017-05-19 DIAGNOSIS — F0390 Unspecified dementia without behavioral disturbance: Secondary | ICD-10-CM | POA: Diagnosis not present

## 2017-05-19 DIAGNOSIS — M179 Osteoarthritis of knee, unspecified: Secondary | ICD-10-CM | POA: Diagnosis not present

## 2017-05-19 DIAGNOSIS — N183 Chronic kidney disease, stage 3 (moderate): Secondary | ICD-10-CM | POA: Diagnosis not present

## 2017-05-19 DIAGNOSIS — E782 Mixed hyperlipidemia: Secondary | ICD-10-CM | POA: Diagnosis not present

## 2017-05-19 DIAGNOSIS — C3430 Malignant neoplasm of lower lobe, unspecified bronchus or lung: Secondary | ICD-10-CM | POA: Diagnosis not present

## 2017-05-19 DIAGNOSIS — I10 Essential (primary) hypertension: Secondary | ICD-10-CM | POA: Diagnosis not present

## 2017-05-19 DIAGNOSIS — D509 Iron deficiency anemia, unspecified: Secondary | ICD-10-CM | POA: Diagnosis not present

## 2017-05-19 DIAGNOSIS — I25111 Atherosclerotic heart disease of native coronary artery with angina pectoris with documented spasm: Secondary | ICD-10-CM | POA: Diagnosis not present

## 2017-05-19 DIAGNOSIS — J40 Bronchitis, not specified as acute or chronic: Secondary | ICD-10-CM | POA: Diagnosis not present

## 2017-05-19 DIAGNOSIS — F322 Major depressive disorder, single episode, severe without psychotic features: Secondary | ICD-10-CM | POA: Diagnosis not present

## 2017-05-19 DIAGNOSIS — M189 Osteoarthritis of first carpometacarpal joint, unspecified: Secondary | ICD-10-CM | POA: Diagnosis not present

## 2017-05-19 DIAGNOSIS — E7800 Pure hypercholesterolemia, unspecified: Secondary | ICD-10-CM | POA: Diagnosis not present

## 2017-05-19 NOTE — Progress Notes (Signed)
HEMATOLOGY/ONCOLOGY CLINIC NOTE  Date of Service: .05/01/2017  Patient Care Team: Josetta Huddle, MD as PCP - General (Internal Medicine)  CHIEF COMPLAINTS/PURPOSE OF CONSULTATION:  Newly diagnosed lung adenocarcinoma  HISTORY OF PRESENTING ILLNESS:   Scott [name] is a wonderful 83 y.o. male who has been referred to [place] by Dr. Josetta Huddle, MD for evaluation of suspected lung cancer.   He has a hx of CAD, CKD, Diastolic CHF, HTN, HLD. He presented to the ED on 03/03/2017 for SOB and was found to have pleural effusion. He was also to have a mass in the upper lobe of left lung after a CT chest Wo contrast with results showing:IMPRESSION:1. 2.4 x 3.6 cm apparent mass in the posterior left upper lobe with associated left upper lobe interlobular septal thickening. Imaging features concerning for neoplasm with lymphangitic tumor spread. 2. Small left pleural effusion. 3. Large hiatal hernia contains nearly the entire stomach with evidence of organoaxial volvulus. No evidence for gastric obstruction. 4. 19 mm right adrenal adenoma.  He is accompanied by his wife and daughter. He states that he began feeling SOB 2 weeks ago. He is not a smoker and has not smoked in the past. He states that he has not been evaluated by his cardiologist, Dr. Larae Grooms. For his recent SOB.  On review of systems, patient reports fatigue, intermittent SOB, and denies fever, chills, CP, cough, weight loss, and any other accompanying symptoms.  INTERVAL HISTORY:   Patient is here for f/u of his newly diagnosed lung adenocarcinoma.He was scheduled to start Nivolumab today. However his now available Foundation One results show presence of a targetable EGFR mutation. His Nivolumab was held and we discussed start him on Tagrisso to treat his EGFR mutated metastatic lung adenocarcinoma. I discussed the foundation One findings , treatment recommendation and rationale indetails with the patient and his wife and  they are agreeable to proceeding with this.  On review of systems, pt denies fever, chills, rash, mouth sores, weight loss, decreased appetite, urinary complaints. Denies pain. Pt denies abdominal pain, nausea, vomiting.     MEDICAL HISTORY:  Past Medical History:  Diagnosis Date  . Anxiety   . Bradycardia    a. BB d/c'd in 2016.  [name] CAD (coronary artery disease)    a. 1983 s/p CABG [facility], [place]);  b. 2007 NSTEMI->med Rx for distal LCX dzs; c. 09/2014 NSTEMI/Cath: LIMA->LAD & diag ok, native RCA dominant and patent.   VG->RCA 100, VG->LCX 100 -->Med Rx.  . CKD (chronic kidney disease), stage III ([place])   . Claudication ([place])    BLE  . Depression   . Diastolic dysfunction    a. 10/2014 Echo: F 60-65%, Gr1 DD, mild AS, mild AI, mild to mod MR, PASP 58mHg.  .Marland KitchenDyspnea 2010   syndrome - extensive  . Erectile dysfunction   . GERD (gastroesophageal reflux disease)   . H/O hiatal hernia   . History of blood transfusion 2014  . Hx of adenomatous colonic polyps 2007  . Hyperlipidemia   . Hypertensive heart disease   . Inguinal hernia   . Iron deficiency anemia   . Shingles 1990's  . Sigmoid diverticulosis   . Upper GI bleeding 2014   Gastritis and possible duodenal ulcer seen on EGD, felt secondary to NSAIDs  . Urethral stricture 11/2008  . Vasovagal syncope 05/2006  . Weight loss     SURGICAL HISTORY: Past Surgical History:  Procedure Laterality Date  . CARDIAC CATHETERIZATION  2007  Left main 100%, RCA 70% ostial, all grafts were patent, EF 40-45 percent, distal circumflex subtotal after graft insertion, medical therapy   . CARDIAC CATHETERIZATION N/A 10/14/2014   Procedure: Left Heart Cath and Cors/Grafts Angiography;  Surgeon: Lorretta Harp, MD;  Location: [facility];  Service: Cardiovascular;  Laterality: N/A;  . CHOLECYSTECTOMY  1980's  . CORONARY ARTERY BYPASS GRAFT  1994    LIMA-LAD, SVG-D1, SVG-OM,  SVG-RCA; performed in [place], [place]  .  ESOPHAGOGASTRODUODENOSCOPY N/A 03/25/2013   Procedure: ESOPHAGOGASTRODUODENOSCOPY (EGD);  Surgeon: Missy Sabins, MD;  gastritis, cannot rule out duodenal ulcer   . INGUINAL HERNIA REPAIR Left   . TRANSURETHRAL RESECTION OF PROSTATE      SOCIAL HISTORY: Social History   Socioeconomic History  . Marital status: Married    Spouse name: Not on file  . Number of children: Not on file  . Years of education: Not on file  . Highest education level: Not on file  Social Needs  . Financial resource strain: Not on file  . Food insecurity - worry: Not on file  . Food insecurity - inability: Not on file  . Transportation needs - medical: Not on file  . Transportation needs - non-medical: Not on file  Occupational History  . Occupation: Retired  Tobacco Use  . Smoking status: Never Smoker  . Smokeless tobacco: Never Used  Substance and Sexual Activity  . Alcohol use: Yes    Alcohol/week: 4.2 oz    Types: 7 Glasses of wine per week    Comment: wine - daily - currently not drinking  . Drug use: No  . Sexual activity: No    Birth control/protection: Abstinence  Other Topics Concern  . Not on file  Social History Narrative   He lives in [facility], independent living, with his wife.    FAMILY HISTORY: Family History  Problem Relation Age of Onset  . Hypertension Mother   . Heart disease Mother   . Heart disease Father   . Heart failure Brother   . Heart attack Neg Hx   . Stroke Neg Hx     ALLERGIES:  has No Known Allergies.  MEDICATIONS:  Current Outpatient Medications  Medication Sig Dispense Refill  . acetaminophen (TYLENOL) 325 MG tablet Take 2 tablets (650 mg total) by mouth every 4 (four) hours as needed for headache or mild pain.    [name] albuterol (PROAIR HFA) 108 (90 Base) MCG/ACT inhaler Inhale 2 puffs into the lungs every 6 (six) hours as needed for wheezing or shortness of breath.    [name] aspirin EC 81 MG tablet Take 81 mg by mouth daily as needed for moderate pain.     [name]  BREO ELLIPTA 100-25 MCG/INH AEPB 1 PUFF BY MOUTH DAILY  11  . buPROPion (WELLBUTRIN XL) 150 MG 24 hr tablet Take 150 mg by mouth daily.     [name] desvenlafaxine (PRISTIQ) 50 MG 24 hr tablet Take 25 mg by mouth daily.     [name] FERROCITE 324 MG TABS tablet TAKE 1 TABLET BY MOUTH QOD  11  . isosorbide mononitrate (IMDUR) 30 MG 24 hr tablet Take 30 mg by mouth daily.    [name] lisinopril (PRINIVIL,ZESTRIL) 10 MG tablet Take 2 tablets (20 mg total) by mouth daily. 60 tablet 0  . LYRICA 50 MG capsule Take 50 mg by mouth daily as needed (pain).     . nitroGLYCERIN (NITROSTAT) 0.4 MG SL tablet Place 1 tablet (0.4 mg total) under  the tongue every 5 (five) minutes x 3 doses as needed for chest pain. 25 tablet 4  . Omega-3 Fatty Acids (FISH OIL PO) Take 1 capsule by mouth daily.    [name] osimertinib mesylate (TAGRISSO) 80 MG tablet Take 1 tablet (80 mg total) by mouth daily. 30 tablet 2  . vitamin B-12 (CYANOCOBALAMIN) 1000 MCG tablet Take 1,000 mcg by mouth every evening.      No current facility-administered medications for this visit.     REVIEW OF SYSTEMS:    10 Point review of Systems was done is negative except as noted above.  PHYSICAL EXAMINATION:  ECOG PERFORMANCE STATUS: 2 - Symptomatic, <50% confined to bed  VS reviewed inEPIC  GENERAL:alert, in no acute distress and comfortable SKIN: no acute rashes, no significant lesions EYES: conjunctiva are pink and non-injected, sclera anicteric OROPHARYNX: MMM, no exudates, no oropharyngeal erythema or ulceration NECK: supple, no JVD LYMPH:  no palpable lymphadenopathy in the cervical, axillary or inguinal regions LUNGS: clear to auscultation b/l with normal respiratory effort HEART: regular rate & rhythm ABDOMEN:  normoactive bowel sounds , non tender, not distended. Extremity: no pedal edema PSYCH: alert & oriented x 3 with fluent speech NEURO: no focal motor/sensory deficits  LABORATORY DATA:  I have reviewed the data as listed  . CBC Latest Ref  Rng & Units 05/01/2017 04/09/2017 03/03/2017  WBC 4.0 - 10.3 10e3/uL 8.8 9.0 8.6  Hemoglobin 13.0 - 17.1 g/dL 14.0 13.2 12.9(L)  Hematocrit 38.4 - 49.9 % 43.9 41.0 40.3  Platelets 140 - 400 10e3/uL 193 201 173    . CMP Latest Ref Rng & Units 05/01/2017 04/09/2017 03/03/2017  Glucose 70 - 140 mg/dl 90 109(H) 95  BUN 7.0 - 26.0 mg/dL 34.9(H) 34(H) 41(H)  Creatinine 0.7 - 1.3 mg/dL 2.3(H) 1.98(H) 2.10(H)  Sodium 136 - 145 mEq/L 140 139 137  Potassium 3.5 - 5.1 mEq/L 4.7 4.5 5.3(H)  Chloride 101 - 111 mmol/L - 106 107  CO2 22 - 29 mEq/L 24 26 23   Calcium 8.4 - 10.4 mg/dL 9.8 8.9 9.1  Total Protein 6.4 - 8.3 g/dL 7.2 6.1(L) 6.8  Total Bilirubin 0.20 - 1.20 mg/dL 0.42 0.2(L) 0.5  Alkaline Phos 40 - 150 U/L 75 69 57  AST 5 - 34 U/L 13 20 16   ALT 0 - 55 U/L 7 10(L) 14(L)     RADIOGRAPHIC STUDIES: I have personally reviewed the radiological images as listed and agreed with the findings in the report. No results found.  ASSESSMENT & PLAN:   LANDO ALCALDE is a wonderful 83 y.o. male with left upper lobe mass.  1) Newly diagnosed Metastatic Left lung Adenocarcinoma involving LUL lung mass left lung with associated moderated malignant left pleural effusion and left hilar LNadenopathy. EGFR mutated lung adenocarcinoma PLAN --I discussed in detail with pt and his family EGFR mutation finding on Foundation One results -Nivolumab plan - we discussed start him on Tagrisso to treat his EGFR mutated metastatic lung adenocarcinoma. I discussed the foundation One findings , treatment recommendation and rationale in details with the patient and his wife and they are agreeable to proceeding with this. - prescription for Tagrisso written and provided to our oral chemo pharmacist. -EKG was done which showed normal Qt  RTC with Dr Irene Limbo in 3 weeks with labs and rpt EKG for toxicity check with Tagrisso.  All of the patients questions were answered with apparent satisfaction. The patient knows to  call the clinic with any problems, questions or  concerns.  I spent 20 minutes counseling the patient face to face. The total time spent in the appointment was 25 minutes and more than 50% was on counseling and direct patient cares.  I have reviewed the above documentation for accuracy and completeness, and I agree with the above.  Sullivan Lone MD [place] AAHIVMS [facility] Hematology/Oncology Physician [facility]  (Office):       [phone] (Work cell):  [phone] (Fax):           [phone]

## 2017-05-23 ENCOUNTER — Other Ambulatory Visit: Payer: Self-pay

## 2017-05-23 ENCOUNTER — Other Ambulatory Visit (HOSPITAL_BASED_OUTPATIENT_CLINIC_OR_DEPARTMENT_OTHER): Payer: Medicare Other

## 2017-05-23 ENCOUNTER — Encounter: Payer: Self-pay | Admitting: Hematology

## 2017-05-23 ENCOUNTER — Ambulatory Visit (HOSPITAL_BASED_OUTPATIENT_CLINIC_OR_DEPARTMENT_OTHER): Payer: Medicare Other | Admitting: Hematology

## 2017-05-23 ENCOUNTER — Telehealth: Payer: Self-pay | Admitting: Hematology

## 2017-05-23 DIAGNOSIS — D6959 Other secondary thrombocytopenia: Secondary | ICD-10-CM | POA: Diagnosis not present

## 2017-05-23 DIAGNOSIS — C3412 Malignant neoplasm of upper lobe, left bronchus or lung: Secondary | ICD-10-CM

## 2017-05-23 DIAGNOSIS — J910 Malignant pleural effusion: Secondary | ICD-10-CM | POA: Diagnosis not present

## 2017-05-23 LAB — CBC WITH DIFFERENTIAL/PLATELET
BASO%: 0.8 % (ref 0.0–2.0)
Basophils Absolute: 0.1 10*3/uL (ref 0.0–0.1)
EOS ABS: 0.4 10*3/uL (ref 0.0–0.5)
EOS%: 5 % (ref 0.0–7.0)
HCT: 41.9 % (ref 38.4–49.9)
HEMOGLOBIN: 13.5 g/dL (ref 13.0–17.1)
LYMPH%: 12.4 % — AB (ref 14.0–49.0)
MCH: 28.2 pg (ref 27.2–33.4)
MCHC: 32.2 g/dL (ref 32.0–36.0)
MCV: 87.4 fL (ref 79.3–98.0)
MONO#: 0.7 10*3/uL (ref 0.1–0.9)
MONO%: 8.5 % (ref 0.0–14.0)
NEUT%: 73.3 % (ref 39.0–75.0)
NEUTROS ABS: 6.2 10*3/uL (ref 1.5–6.5)
PLATELETS: 122 10*3/uL — AB (ref 140–400)
RBC: 4.79 10*6/uL (ref 4.20–5.82)
RDW: 14.9 % — AB (ref 11.0–14.6)
WBC: 8.4 10*3/uL (ref 4.0–10.3)
lymph#: 1 10*3/uL (ref 0.9–3.3)

## 2017-05-23 LAB — COMPREHENSIVE METABOLIC PANEL
ALBUMIN: 3.6 g/dL (ref 3.5–5.0)
ALK PHOS: 67 U/L (ref 40–150)
ALT: 16 U/L (ref 0–55)
ANION GAP: 7 meq/L (ref 3–11)
AST: 15 U/L (ref 5–34)
BUN: 41.7 mg/dL — ABNORMAL HIGH (ref 7.0–26.0)
CALCIUM: 9.2 mg/dL (ref 8.4–10.4)
CHLORIDE: 109 meq/L (ref 98–109)
CO2: 24 mEq/L (ref 22–29)
Creatinine: 2.5 mg/dL — ABNORMAL HIGH (ref 0.7–1.3)
EGFR: 22 mL/min/{1.73_m2} — ABNORMAL LOW (ref >60)
Glucose: 81 mg/dl (ref 70–140)
POTASSIUM: 5 meq/L (ref 3.5–5.1)
Sodium: 140 mEq/L (ref 136–145)
Total Bilirubin: 0.41 mg/dL (ref 0.20–1.20)
Total Protein: 6.9 g/dL (ref 6.4–8.3)

## 2017-05-23 LAB — MAGNESIUM: Magnesium: 3.2 mg/dl — ABNORMAL HIGH (ref 1.5–2.5)

## 2017-05-23 NOTE — Progress Notes (Signed)
HEMATOLOGY/ONCOLOGY CLINIC NOTE  Date of Service: 05/23/17   Patient Care Team: Josetta Huddle, MD as PCP - General (Internal Medicine)  CHIEF COMPLAINTS/PURPOSE OF CONSULTATION:  Newly diagnosed lung adenocarcinoma  HISTORY OF PRESENTING ILLNESS:   Scott [name] is a wonderful 84 y.o. male who has been referred to [place] by Dr. Josetta Huddle, MD for evaluation of suspected lung cancer.   He has a hx of CAD, CKD, Diastolic CHF, HTN, HLD. He presented to the ED on 03/03/2017 for SOB and was found to have pleural effusion. He was also to have a mass in the upper lobe of left lung after a CT chest Wo contrast with results showing:IMPRESSION:1. 2.4 x 3.6 cm apparent mass in the posterior left upper lobe with associated left upper lobe interlobular septal thickening. Imaging features concerning for neoplasm with lymphangitic tumor spread. 2. Small left pleural effusion. 3. Large hiatal hernia contains nearly the entire stomach with evidence of organoaxial volvulus. No evidence for gastric obstruction. 4. 19 mm right adrenal adenoma.  He is accompanied by his wife and daughter. He states that he began feeling SOB 2 weeks ago. He is not a smoker and has not smoked in the past. He states that he has not been evaluated by his cardiologist, Dr. Larae Grooms. For his recent SOB.  On review of systems, patient reports fatigue, intermittent SOB, and denies fever, chills, CP, cough, weight loss, and any other accompanying symptoms.  INTERVAL HISTORY:   Patient is here for f/u of his newly diagnosed lung adenocarcinoma. He is accompanied by his wife and daughter. He states he is doing well overall. He started Tagrisso on 05/03/2017 and has tolerated it well aside from some grade 1 fatigue. He states noticing a loss of energy. No overt changed in breathing or chest pain. He notes he has been more wheezy at times and this responds to his inhaler use. No diarrhea.  On ROS, pt reports grade 1  fatigue, wheezing, cough , weight loss and denies nausea, skin rashes, diarrhea, mouth sores or soreness, HA, CP and any other accompanying symptoms.  MEDICAL HISTORY:  Past Medical History:  Diagnosis Date  . Anxiety   . Bradycardia    a. BB d/c'd in 2016.  [name] CAD (coronary artery disease)    a. 1983 s/p CABG [facility] [place]);  b. 2007 NSTEMI->med Rx for distal LCX dzs; c. 09/2014 NSTEMI/Cath: LIMA->LAD & diag ok, native RCA dominant and patent.   VG->RCA 100, VG->LCX 100 -->Med Rx.  . CKD (chronic kidney disease), stage III ([place])   . Claudication ([place])    BLE  . Depression   . Diastolic dysfunction    a. 10/2014 Echo: F 60-65%, Gr1 DD, mild AS, mild AI, mild to mod MR, PASP 33mHg.  .Marland KitchenDyspnea 2010   syndrome - extensive  . Erectile dysfunction   . GERD (gastroesophageal reflux disease)   . H/O hiatal hernia   . History of blood transfusion 2014  . Hx of adenomatous colonic polyps 2007  . Hyperlipidemia   . Hypertensive heart disease   . Inguinal hernia   . Iron deficiency anemia   . Shingles 1990's  . Sigmoid diverticulosis   . Upper GI bleeding 2014   Gastritis and possible duodenal ulcer seen on EGD, felt secondary to NSAIDs  . Urethral stricture 11/2008  . Vasovagal syncope 05/2006  . Weight loss     SURGICAL HISTORY: Past Surgical History:  Procedure Laterality Date  . CARDIAC CATHETERIZATION  2007   Left main 100%, RCA 70% ostial, all grafts were patent, EF 40-45 percent, distal circumflex subtotal after graft insertion, medical therapy   . CARDIAC CATHETERIZATION N/A 10/14/2014   Procedure: Left Heart Cath and Cors/Grafts Angiography;  Surgeon: Lorretta Harp, MD;  Location: [facility];  Service: Cardiovascular;  Laterality: N/A;  . CHOLECYSTECTOMY  1980's  . CORONARY ARTERY BYPASS GRAFT  1994    LIMA-LAD, SVG-D1, SVG-OM,  SVG-RCA; performed in [place], [place]  . ESOPHAGOGASTRODUODENOSCOPY N/A 03/25/2013   Procedure: ESOPHAGOGASTRODUODENOSCOPY (EGD);   Surgeon: Missy Sabins, MD;  gastritis, cannot rule out duodenal ulcer   . INGUINAL HERNIA REPAIR Left   . TRANSURETHRAL RESECTION OF PROSTATE      SOCIAL HISTORY: Social History   Socioeconomic History  . Marital status: Married    Spouse name: Not on file  . Number of children: Not on file  . Years of education: Not on file  . Highest education level: Not on file  Social Needs  . Financial resource strain: Not on file  . Food insecurity - worry: Not on file  . Food insecurity - inability: Not on file  . Transportation needs - medical: Not on file  . Transportation needs - non-medical: Not on file  Occupational History  . Occupation: Retired  Tobacco Use  . Smoking status: Never Smoker  . Smokeless tobacco: Never Used  Substance and Sexual Activity  . Alcohol use: Yes    Alcohol/week: 4.2 oz    Types: 7 Glasses of wine per week    Comment: wine - daily - currently not drinking  . Drug use: No  . Sexual activity: No    Birth control/protection: Abstinence  Other Topics Concern  . Not on file  Social History Narrative   He lives in [facility], independent living, with his wife.    FAMILY HISTORY: Family History  Problem Relation Age of Onset  . Hypertension Mother   . Heart disease Mother   . Heart disease Father   . Heart failure Brother   . Heart attack Neg Hx   . Stroke Neg Hx     ALLERGIES:  has No Known Allergies.  MEDICATIONS:  Current Outpatient Medications  Medication Sig Dispense Refill  . acetaminophen (TYLENOL) 325 MG tablet Take 2 tablets (650 mg total) by mouth every 4 (four) hours as needed for headache or mild pain.    [name] albuterol (PROAIR HFA) 108 (90 Base) MCG/ACT inhaler Inhale 2 puffs into the lungs every 6 (six) hours as needed for wheezing or shortness of breath.    [name] aspirin EC 81 MG tablet Take 81 mg by mouth daily as needed for moderate pain.     [name] BREO ELLIPTA 100-25 MCG/INH AEPB 1 PUFF BY MOUTH DAILY  11  . buPROPion (WELLBUTRIN XL)  150 MG 24 hr tablet Take 150 mg by mouth daily.     [name] desvenlafaxine (PRISTIQ) 50 MG 24 hr tablet Take 25 mg by mouth daily.     [name] FERROCITE 324 MG TABS tablet TAKE 1 TABLET BY MOUTH QOD  11  . isosorbide mononitrate (IMDUR) 30 MG 24 hr tablet Take 30 mg by mouth daily.    [name] lisinopril (PRINIVIL,ZESTRIL) 10 MG tablet Take 2 tablets (20 mg total) by mouth daily. 60 tablet 0  . LYRICA 50 MG capsule Take 50 mg by mouth daily as needed (pain).     . nitroGLYCERIN (NITROSTAT) 0.4 MG SL tablet Place 1 tablet (0.4  mg total) under the tongue every 5 (five) minutes x 3 doses as needed for chest pain. 25 tablet 4  . Omega-3 Fatty Acids (FISH OIL PO) Take 1 capsule by mouth daily.    [name] osimertinib mesylate (TAGRISSO) 80 MG tablet Take 1 tablet (80 mg total) by mouth daily. 30 tablet 2  . vitamin B-12 (CYANOCOBALAMIN) 1000 MCG tablet Take 1,000 mcg by mouth every evening.      No current facility-administered medications for this visit.     REVIEW OF SYSTEMS:    10 Point review of Systems was done is negative except as noted above.  PHYSICAL EXAMINATION:  ECOG PERFORMANCE STATUS: 2 - Symptomatic, <50% confined to bed  VS reviewed inEPIC  GENERAL:alert, in no acute distress and comfortable SKIN: no acute rashes, no significant lesions EYES: conjunctiva are pink and non-injected, sclera anicteric OROPHARYNX: MMM, no exudates, no oropharyngeal erythema or ulceration NECK: supple, no JVD LYMPH:  no palpable lymphadenopathy in the cervical, axillary or inguinal regions LUNGS: clear to auscultation b/l with normal respiratory effort HEART: regular rate & rhythm ABDOMEN:  normoactive bowel sounds , non tender, not distended. Extremity: no pedal edema PSYCH: alert & oriented x 3 with fluent speech NEURO: no focal motor/sensory deficits  LABORATORY DATA:  I have reviewed the data as listed  . CBC Latest Ref Rng & Units 05/23/2017 05/01/2017 04/09/2017  WBC 4.0 - 10.3 10e3/uL 8.4 8.8 9.0    Hemoglobin 13.0 - 17.1 g/dL 13.5 14.0 13.2  Hematocrit 38.4 - 49.9 % 41.9 43.9 41.0  Platelets 140 - 400 10e3/uL 122(L) 193 201    . CMP Latest Ref Rng & Units 05/23/2017 05/01/2017 04/09/2017  Glucose 70 - 140 mg/dl 81 90 109(H)  BUN 7.0 - 26.0 mg/dL 41.7(H) 34.9(H) 34(H)  Creatinine 0.7 - 1.3 mg/dL 2.5(H) 2.3(H) 1.98(H)  Sodium 136 - 145 mEq/L 140 140 139  Potassium 3.5 - 5.1 mEq/L 5.0 4.7 4.5  Chloride 101 - 111 mmol/L - - 106  CO2 22 - 29 mEq/L _0 Calcium 8.4 - 10.4 mg/dL 9.2 9.8 8.9  Total Protein 6.4 - 8.3 g/dL 6.9 7.2 6.1(L)  Total Bilirubin 0.20 - 1.20 mg/dL 0.41 0.42 0.2(L)  Alkaline Phos 40 - 150 U/L 67 75 69  AST 5 - 34 U/L _1 ALT 0 - 55 U/L 16 7 10(L)     RADIOGRAPHIC STUDIES: I have personally reviewed the radiological images as listed and agreed with the findings in the report. No results found.  ASSESSMENT & PLAN:   CHEVEZ SAMBRANO is a wonderful 84 y.o. male with left upper lobe mass.  1) Newly diagnosed Metastatic Left lung Adenocarcinoma involving LUL lung mass left lung with associated moderated malignant left pleural effusion and left hilar LNadenopathy. EGFR mutated lung adenocarcinoma  2) Mild thrombocytopenia related to [employer] -I discussed in detail with pt and his family EGFR mutation finding on Foundation One results -labs stable , no prohibitive toxicity.from [place] tolerating weill -EKG was done which showed normal Qt --Continue Tagrisso  -fu with PCP, Dr. Inda Merlin to optimize inhalers and action plan for COPD    RTC with Dr Irene Limbo in 4 weeks with labs and EKG  All of the patients questions were answered with apparent satisfaction. The patient knows to call the clinic with any problems, questions or concerns.  I spent 15 minutes counseling the patient face to face. The total time spent in the appointment was 20 minutes and more than  50% was on counseling and direct patient cares.  I have reviewed the above documentation  for accuracy and completeness, and I agree with the above.  Sullivan Lone MD [place] AAHIVMS [facility] Hematology/Oncology Physician [facility]  (Office):       [phone] (Work cell):  [phone] (Fax):           [phone]  This document serves as a record of services personally performed by Sullivan Lone, MD. It was created on his behalf by Alean Rinne, a trained medical scribe. The creation of this record is based on the scribe's personal observations and the provider's statements to them.   .I have reviewed the above documentation for accuracy and completeness, and I agree with the above. Brunetta Genera MD

## 2017-05-23 NOTE — Telephone Encounter (Signed)
Scheduled appt per 1/4 los - Gave patient AVS and calender per los.  

## 2017-05-23 NOTE — Patient Instructions (Signed)
Thank you for choosing [facility] to provide your oncology and hematology care.  To afford each patient quality time with our providers, please arrive 30 minutes before your scheduled appointment time.  If you arrive late for your appointment, you may be asked to reschedule.  We strive to give you quality time with our providers, and arriving late affects you and other patients whose appointments are after yours.  If you are a no show for multiple scheduled visits, you may be dismissed from the clinic at the providers discretion.   Again, thank you for choosing [facility], our hope is that these requests will decrease the amount of time that you wait before being seen by our physicians.  ______________________________________________________________________ Should you have questions after your visit to the [facility], please contact our office at [phone] between the hours of 8:30 and 4:30 p.m.    Voicemails left after 4:30p.m will not be returned until the following business day.   For prescription refill requests, please have your pharmacy contact us directly.  Please also try to allow 48 hours for prescription requests.   Please contact the scheduling department for questions regarding scheduling.  For scheduling of procedures such as PET scans, CT scans, MRI, Ultrasound, etc please contact central scheduling at [phone].   Resources For Cancer Patients and Caregivers:  American Cancer Society:  800-227-2345  Can help patients locate various types of support and financial assistance Cancer Care: 1-800-813-HOPE (4673) Provides financial assistance, online support groups, medication/co-pay assistance.   [place] DSS:  336-641-3447 Where to apply for food stamps, Medicaid, and utility assistance Medicare Rights Center: 800-333-4114 Helps people with Medicare understand their rights and benefits, navigate the Medicare system, and secure the  quality healthcare they deserve SCAT: 336-333-6589 [employer]'s shared-ride transportation service for eligible riders who have a disability that prevents them from riding the fixed route bus.   For additional information on assistance programs please contact our social worker:   Grier Hock/Abigail Elmore:  [phone] 

## 2017-05-24 LAB — PHOSPHORUS: PHOSPHORUS: 3.7 mg/dL (ref 2.5–4.5)

## 2017-05-27 MED FILL — TAGRISSO 80 MG TABLET: 80 | 30 days supply | Qty: 30 | Fill #1

## 2017-06-19 NOTE — Progress Notes (Signed)
HEMATOLOGY/ONCOLOGY CLINIC NOTE  Date of Service: 06/20/17  Patient Care Team: Josetta Huddle, MD as PCP - General (Internal Medicine)  CHIEF COMPLAINTS/PURPOSE OF CONSULTATION:  Newly diagnosed lung adenocarcinoma  HISTORY OF PRESENTING ILLNESS:   Scott [name] is a wonderful 84 y.o. male who has been referred to [place] by Dr. Josetta Huddle, MD for evaluation of suspected lung cancer.   He has a hx of CAD, CKD, Diastolic CHF, HTN, HLD. He presented to the ED on 03/03/2017 for SOB and was found to have pleural effusion. He was also to have a mass in the upper lobe of left lung after a CT chest Wo contrast with results showing:IMPRESSION:1. 2.4 x 3.6 cm apparent mass in the posterior left upper lobe with associated left upper lobe interlobular septal thickening. Imaging features concerning for neoplasm with lymphangitic tumor spread. 2. Small left pleural effusion. 3. Large hiatal hernia contains nearly the entire stomach with evidence of organoaxial volvulus. No evidence for gastric obstruction. 4. 19 mm right adrenal adenoma.  He is accompanied by his wife and daughter. He states that he began feeling SOB 2 weeks ago. He is not a smoker and has not smoked in the past. He states that he has not been evaluated by his cardiologist, Dr. Larae Grooms. For his recent SOB.  On review of systems, patient reports fatigue, intermittent SOB, and denies fever, chills, CP, cough, weight loss, and any other accompanying symptoms.  INTERVAL HISTORY:   Patient is here for f/u of his recently diagnosed lung adenocarcinoma.The patient's last visit with [place] was on 05/01/17. He is accompanied today by his wife and daughter. The pt reports that he is doing well overall and enjoyed having family over during the holidays and look forward to 4 grandchildren graduating college this spring. Will return to his PCP in March.  Lab results today (06/20/17) of CBC, CMP, and Reticulocytes is as follows: all values  are WNL except for MCHC at 31.5, Platelet at 103k, Lymph% at 12.4.  On review of systems, pt reports some blood on the toilet paper, occasional SOB when he moves around. He denies wheezing, blood in the stool, nausea, rashes, sores and any other symptoms.    MEDICAL HISTORY:  Past Medical History:  Diagnosis Date  . Anxiety   . Bradycardia    a. BB d/c'd in 2016.  [name] CAD (coronary artery disease)    a. 1983 s/p CABG [facility] [place]);  b. 2007 NSTEMI->med Rx for distal LCX dzs; c. 09/2014 NSTEMI/Cath: LIMA->LAD & diag ok, native RCA dominant and patent.   VG->RCA 100, VG->LCX 100 -->Med Rx.  . CKD (chronic kidney disease), stage III ([place])   . Claudication ([place])    BLE  . Depression   . Diastolic dysfunction    a. 10/2014 Echo: F 60-65%, Gr1 DD, mild AS, mild AI, mild to mod MR, PASP 30mHg.  .Marland KitchenDyspnea 2010   syndrome - extensive  . Erectile dysfunction   . GERD (gastroesophageal reflux disease)   . H/O hiatal hernia   . History of blood transfusion 2014  . Hx of adenomatous colonic polyps 2007  . Hyperlipidemia   . Hypertensive heart disease   . Inguinal hernia   . Iron deficiency anemia   . Shingles 1990's  . Sigmoid diverticulosis   . Upper GI bleeding 2014   Gastritis and possible duodenal ulcer seen on EGD, felt secondary to NSAIDs  . Urethral stricture 11/2008  . Vasovagal syncope 05/2006  . Weight  loss     SURGICAL HISTORY: Past Surgical History:  Procedure Laterality Date  . CARDIAC CATHETERIZATION  2007   Left main 100%, RCA 70% ostial, all grafts were patent, EF 40-45 percent, distal circumflex subtotal after graft insertion, medical therapy   . CARDIAC CATHETERIZATION N/A 10/14/2014   Procedure: Left Heart Cath and Cors/Grafts Angiography;  Surgeon: Lorretta Harp, MD;  Location: [facility];  Service: Cardiovascular;  Laterality: N/A;  . CHOLECYSTECTOMY  1980's  . CORONARY ARTERY BYPASS GRAFT  1994    LIMA-LAD, SVG-D1, SVG-OM,  SVG-RCA; performed in  [place], [place]  . ESOPHAGOGASTRODUODENOSCOPY N/A 03/25/2013   Procedure: ESOPHAGOGASTRODUODENOSCOPY (EGD);  Surgeon: Missy Sabins, MD;  gastritis, cannot rule out duodenal ulcer   . INGUINAL HERNIA REPAIR Left   . TRANSURETHRAL RESECTION OF PROSTATE      SOCIAL HISTORY: Social History   Socioeconomic History  . Marital status: Married    Spouse name: Not on file  . Number of children: Not on file  . Years of education: Not on file  . Highest education level: Not on file  Social Needs  . Financial resource strain: Not on file  . Food insecurity - worry: Not on file  . Food insecurity - inability: Not on file  . Transportation needs - medical: Not on file  . Transportation needs - non-medical: Not on file  Occupational History  . Occupation: Retired  Tobacco Use  . Smoking status: Never Smoker  . Smokeless tobacco: Never Used  Substance and Sexual Activity  . Alcohol use: Yes    Alcohol/week: 4.2 oz    Types: 7 Glasses of wine per week    Comment: wine - daily - currently not drinking  . Drug use: No  . Sexual activity: No    Birth control/protection: Abstinence  Other Topics Concern  . Not on file  Social History Narrative   He lives in [facility], independent living, with his wife.    FAMILY HISTORY: Family History  Problem Relation Age of Onset  . Hypertension Mother   . Heart disease Mother   . Heart disease Father   . Heart failure Brother   . Heart attack Neg Hx   . Stroke Neg Hx     ALLERGIES:  has No Known Allergies.  MEDICATIONS:  Current Outpatient Medications  Medication Sig Dispense Refill  . acetaminophen (TYLENOL) 325 MG tablet Take 2 tablets (650 mg total) by mouth every 4 (four) hours as needed for headache or mild pain.    [name] albuterol (PROAIR HFA) 108 (90 Base) MCG/ACT inhaler Inhale 2 puffs into the lungs every 6 (six) hours as needed for wheezing or shortness of breath.    [name] aspirin EC 81 MG tablet Take 81 mg by mouth daily as  needed for moderate pain.     [name] BREO ELLIPTA 100-25 MCG/INH AEPB 1 PUFF BY MOUTH DAILY  11  . buPROPion (WELLBUTRIN XL) 150 MG 24 hr tablet Take 150 mg by mouth daily.     [name] desvenlafaxine (PRISTIQ) 50 MG 24 hr tablet Take 25 mg by mouth daily.     [name] FERROCITE 324 MG TABS tablet TAKE 1 TABLET BY MOUTH QOD  11  . isosorbide mononitrate (IMDUR) 30 MG 24 hr tablet Take 30 mg by mouth daily.    [name] lisinopril (PRINIVIL,ZESTRIL) 10 MG tablet Take 2 tablets (20 mg total) by mouth daily. 60 tablet 0  . LYRICA 50 MG capsule Take 50 mg by mouth  daily as needed (pain).     . nitroGLYCERIN (NITROSTAT) 0.4 MG SL tablet Place 1 tablet (0.4 mg total) under the tongue every 5 (five) minutes x 3 doses as needed for chest pain. 25 tablet 4  . Omega-3 Fatty Acids (FISH OIL PO) Take 1 capsule by mouth daily.    [name] osimertinib mesylate (TAGRISSO) 80 MG tablet Take 1 tablet (80 mg total) by mouth daily. 30 tablet 2  . vitamin B-12 (CYANOCOBALAMIN) 1000 MCG tablet Take 1,000 mcg by mouth every evening.      No current facility-administered medications for this visit.     REVIEW OF SYSTEMS:    10 Point review of Systems was done is negative except as noted above.  PHYSICAL EXAMINATION:  ECOG PERFORMANCE STATUS: 2 - Symptomatic, <50% confined to bed  VS reviewed inEPIC  GENERAL:alert, in no acute distress and comfortable SKIN: no acute rashes, no significant lesions EYES: conjunctiva are pink and non-injected, sclera anicteric OROPHARYNX: MMM, no exudates, no oropharyngeal erythema or ulceration NECK: supple, no JVD LYMPH:  no palpable lymphadenopathy in the cervical, axillary or inguinal regions LUNGS: decreased breath sounds left lung base HEART: regular rate & rhythm ABDOMEN:  normoactive bowel sounds , non tender, not distended. Extremity: no pedal edema PSYCH: alert & oriented x 3 with fluent speech NEURO: no focal motor/sensory deficits  LABORATORY DATA:  I have reviewed the data as  listed  . CBC Latest Ref Rng & Units 06/20/2017 05/23/2017 05/01/2017  WBC 4.0 - 10.3 K/uL 5.7 8.4 8.8  Hemoglobin 13.0 - 17.1 g/dL - 13.5 14.0  Hematocrit 38.4 - 49.9 % 42.9 41.9 43.9  Platelets 140 - 400 K/uL 103(L) 122(L) 193    . CMP Latest Ref Rng & Units 06/20/2017 05/23/2017 05/01/2017  Glucose 70 - 140 mg/dL 104 81 90  BUN 7 - 26 mg/dL 35(H) 41.7(H) 34.9(H)  Creatinine 0.70 - 1.30 mg/dL 2.30(H) 2.5(H) 2.3(H)  Sodium 136 - 145 mmol/L 140 140 140  Potassium 3.5 - 5.1 mmol/L 5.4(H) 5.0 4.7  Chloride 98 - 109 mmol/L 107 - -  CO2 22 - 29 mmol/L 26 24 24   Calcium 8.4 - 10.4 mg/dL 9.3 9.2 9.8  Total Protein 6.4 - 8.3 g/dL 6.7 6.9 7.2  Total Bilirubin 0.2 - 1.2 mg/dL 0.4 0.41 0.42  Alkaline Phos 40 - 150 U/L 58 67 75  AST 5 - 34 U/L 20 15 13   ALT 0 - 55 U/L 24 16 7      RADIOGRAPHIC STUDIES: I have personally reviewed the radiological images as listed and agreed with the findings in the report. No results found.  ASSESSMENT & PLAN:   Scott [name] is a wonderful 84 y.o. male with left upper lobe mass.  1)Recently diagnosed Metastatic Left lung Adenocarcinoma involving LUL lung mass left lung with associated moderated malignant left pleural effusion and left hilar LNadenopathy. EGFR mutated lung adenocarcinoma  2) DOE slightly increased. ? Increasing pleural effusion.  PLAN - tolerating Tagrisso without any prohibitive toxicities except grade 1 fatigue. -continue Tagrisso at current dose. -Discussed pt labwork today, will continue to monitor his platelets. His blood chemistries are stable except for Potassium at 5.4.His kidney function is unchanged.  -Repeat CT scan in another 5-6 weeks --Recommended eating less bananas, tomatoes and oranges to bring potassium down a little.  -Discussed pt seeing his PCP Dr Inda Merlin to reexamine his potassium levels and if he needs to adjust lisinopril. Also examining an inhaler dosage adjustment.  -Encouraged pt to take  advantage of the PT  offered in his facility.  -Repeat CXR to make sure fluid is not returning, and decide if a thoracentesis is necessary.   CXR today CT chest in 5 weeks RTC with Dr Irene Limbo in 6 weeks with labs F/u with PCP in 2 weeks for rpt labs   All of the patients questions were answered with apparent satisfaction. The patient knows to call the clinic with any problems, questions or concerns.  I spent 20 minutes counseling the patient face to face. The total time spent in the appointment was 25 minutes and more than 50% was on counseling and direct patient cares.  Sullivan Lone MD [place] AAHIVMS [facility] Hematology/Oncology Physician [facility]  (Office):       [phone] (Work cell):  [phone] (Fax):           [phone]  This document serves as a record of services personally performed by Sullivan Lone, MD. It was created on his behalf by Baldwin Jamaica, a trained medical scribe. The creation of this record is based on the scribe's personal observations and the provider's statements to them.   .I have reviewed the above documentation for accuracy and completeness, and I agree with the above. Brunetta Genera MD   ADDENDUM  CXR 06/20/2017 : IMPRESSION: 1. Moderate to large left pleural effusion with mild increased from 04/09/2017. 2. Cardiomegaly and large hiatal hernia.   Electronically Signed   By: Monte Fantasia M.D.  Plan -will place order for US guided thoracentesis is patient agreeable.  Brunetta Genera MD MS

## 2017-06-20 ENCOUNTER — Encounter: Payer: Self-pay | Admitting: Hematology

## 2017-06-20 ENCOUNTER — Ambulatory Visit (HOSPITAL_COMMUNITY)
Admission: RE | Admit: 2017-06-20 | Discharge: 2017-06-20 | Disposition: A | Discharge status: Home / Self Care | Payer: Medicare Other | Source: Ambulatory Visit | Attending: Hematology | Admitting: Hematology

## 2017-06-20 ENCOUNTER — Telehealth: Payer: Self-pay | Admitting: Hematology

## 2017-06-20 ENCOUNTER — Inpatient Hospital Stay: Payer: Medicare Other | Attending: Hematology | Admitting: Hematology

## 2017-06-20 ENCOUNTER — Inpatient Hospital Stay: Payer: Medicare Other

## 2017-06-20 VITALS — BP 133/74 | HR 74 | Temp 97.7°F | Resp 18 | Ht 66.0 in | Wt 152.7 lb

## 2017-06-20 DIAGNOSIS — C3412 Malignant neoplasm of upper lobe, left bronchus or lung: Secondary | ICD-10-CM

## 2017-06-20 DIAGNOSIS — J90 Pleural effusion, not elsewhere classified: Secondary | ICD-10-CM | POA: Diagnosis not present

## 2017-06-20 DIAGNOSIS — M179 Osteoarthritis of knee, unspecified: Secondary | ICD-10-CM | POA: Diagnosis not present

## 2017-06-20 DIAGNOSIS — J40 Bronchitis, not specified as acute or chronic: Secondary | ICD-10-CM | POA: Diagnosis not present

## 2017-06-20 DIAGNOSIS — E782 Mixed hyperlipidemia: Secondary | ICD-10-CM | POA: Diagnosis not present

## 2017-06-20 DIAGNOSIS — J910 Malignant pleural effusion: Secondary | ICD-10-CM | POA: Insufficient documentation

## 2017-06-20 DIAGNOSIS — I25111 Atherosclerotic heart disease of native coronary artery with angina pectoris with documented spasm: Secondary | ICD-10-CM | POA: Diagnosis not present

## 2017-06-20 DIAGNOSIS — F322 Major depressive disorder, single episode, severe without psychotic features: Secondary | ICD-10-CM | POA: Diagnosis not present

## 2017-06-20 DIAGNOSIS — C3430 Malignant neoplasm of lower lobe, unspecified bronchus or lung: Secondary | ICD-10-CM | POA: Diagnosis not present

## 2017-06-20 DIAGNOSIS — E7800 Pure hypercholesterolemia, unspecified: Secondary | ICD-10-CM | POA: Diagnosis not present

## 2017-06-20 DIAGNOSIS — F0390 Unspecified dementia without behavioral disturbance: Secondary | ICD-10-CM | POA: Diagnosis not present

## 2017-06-20 DIAGNOSIS — N183 Chronic kidney disease, stage 3 (moderate): Secondary | ICD-10-CM | POA: Diagnosis not present

## 2017-06-20 DIAGNOSIS — E875 Hyperkalemia: Secondary | ICD-10-CM | POA: Insufficient documentation

## 2017-06-20 DIAGNOSIS — I10 Essential (primary) hypertension: Secondary | ICD-10-CM | POA: Diagnosis not present

## 2017-06-20 DIAGNOSIS — R0602 Shortness of breath: Secondary | ICD-10-CM | POA: Diagnosis not present

## 2017-06-20 DIAGNOSIS — R0609 Other forms of dyspnea: Secondary | ICD-10-CM | POA: Diagnosis not present

## 2017-06-20 DIAGNOSIS — M189 Osteoarthritis of first carpometacarpal joint, unspecified: Secondary | ICD-10-CM | POA: Diagnosis not present

## 2017-06-20 DIAGNOSIS — D509 Iron deficiency anemia, unspecified: Secondary | ICD-10-CM | POA: Diagnosis not present

## 2017-06-20 LAB — COMPREHENSIVE METABOLIC PANEL
ALBUMIN: 3.5 g/dL (ref 3.5–5.0)
ALK PHOS: 58 U/L (ref 40–150)
ALT: 24 U/L (ref 0–55)
ANION GAP: 7 (ref 3–11)
AST: 20 U/L (ref 5–34)
BUN: 35 mg/dL — ABNORMAL HIGH (ref 7–26)
CALCIUM: 9.3 mg/dL (ref 8.4–10.4)
CHLORIDE: 107 mmol/L (ref 98–109)
CO2: 26 mmol/L (ref 22–29)
Creatinine, Ser: 2.3 mg/dL — ABNORMAL HIGH (ref 0.70–1.30)
GFR calc Af Amer: 27 mL/min — ABNORMAL LOW (ref >60)
GFR calc non Af Amer: 23 mL/min — ABNORMAL LOW (ref >60)
GLUCOSE: 104 mg/dL (ref 70–140)
Potassium: 5.4 mmol/L — ABNORMAL HIGH (ref 3.5–5.1)
SODIUM: 140 mmol/L (ref 136–145)
Total Bilirubin: 0.4 mg/dL (ref 0.2–1.2)
Total Protein: 6.7 g/dL (ref 6.4–8.3)

## 2017-06-20 LAB — PHOSPHORUS: Phosphorus: 3.4 mg/dL (ref 2.5–4.6)

## 2017-06-20 LAB — CBC WITH DIFFERENTIAL (CANCER CENTER ONLY)
BASOS PCT: 1 %
Basophils Absolute: 0 10*3/uL (ref 0.0–0.1)
Eosinophils Absolute: 0.3 10*3/uL (ref 0.0–0.5)
Eosinophils Relative: 5 %
HEMATOCRIT: 42.9 % (ref 38.4–49.9)
Hemoglobin: 13.5 g/dL (ref 13.0–17.1)
LYMPHS ABS: 1.2 10*3/uL (ref 0.9–3.3)
LYMPHS PCT: 21 %
MCH: 28.5 pg (ref 27.2–33.4)
MCHC: 31.5 g/dL — AB (ref 32.0–36.0)
MCV: 90.7 fL (ref 79.3–98.0)
MONO ABS: 0.5 10*3/uL (ref 0.1–0.9)
MONOS PCT: 9 %
NEUTROS ABS: 3.7 10*3/uL (ref 1.5–6.5)
Neutrophils Relative %: 64 %
Platelet Count: 103 10*3/uL — ABNORMAL LOW (ref 140–400)
RBC: 4.73 MIL/uL (ref 4.20–5.82)
RDW: 14.3 % (ref 11.0–14.6)
WBC Count: 5.7 10*3/uL (ref 4.0–10.3)

## 2017-06-20 LAB — RETICULOCYTES
RBC.: 4.73 MIL/uL (ref 4.20–5.82)
Retic Count, Absolute: 56.8 10*3/uL (ref 34.8–93.9)
Retic Ct Pct: 1.2 % (ref 0.8–1.8)

## 2017-06-20 LAB — MAGNESIUM: Magnesium: 3 mg/dL — ABNORMAL HIGH (ref 1.5–2.5)

## 2017-06-20 NOTE — Telephone Encounter (Signed)
Patient scheduled and was given copy of AVS/Cal and copy of Rad scheduling phone #

## 2017-06-23 ENCOUNTER — Other Ambulatory Visit: Payer: Self-pay | Admitting: Pharmacist

## 2017-06-23 ENCOUNTER — Telehealth: Payer: Self-pay

## 2017-06-23 NOTE — Telephone Encounter (Signed)
Per Dr. Irene Limbo, pt has pleural effusion. Requested set up of thoracentesis if pt in agreement. Scheduled for appt on 2/7 at 1000 with [facility]. Pt wife verbalized that daughter usually likes to come to appts and that she is teaching that day. Number provided for Central Scheduling [phone]. Pt wife planning to call tomorrow to determine other availability that would work best for them. Spelled out name of procedure to better be able to communicate to scheduler what procedure to change "[id number]". Pt wife performed readback of procedure name and number for Central Scheduling.

## 2017-06-26 ENCOUNTER — Ambulatory Visit (HOSPITAL_COMMUNITY)
Admission: RE | Admit: 2017-06-26 | Discharge: 2017-06-26 | Disposition: A | Discharge status: Home / Self Care | Payer: Medicare Other | Source: Ambulatory Visit | Attending: Hematology | Admitting: Hematology

## 2017-06-26 ENCOUNTER — Ambulatory Visit (HOSPITAL_COMMUNITY): Payer: Medicare Other

## 2017-06-26 ENCOUNTER — Ambulatory Visit (HOSPITAL_COMMUNITY)
Admission: RE | Admit: 2017-06-26 | Discharge: 2017-06-26 | Disposition: A | Discharge status: Home / Self Care | Payer: Medicare Other | Source: Ambulatory Visit | Attending: Radiology | Admitting: Radiology

## 2017-06-26 DIAGNOSIS — J910 Malignant pleural effusion: Secondary | ICD-10-CM | POA: Insufficient documentation

## 2017-06-26 DIAGNOSIS — J90 Pleural effusion, not elsewhere classified: Secondary | ICD-10-CM | POA: Diagnosis not present

## 2017-06-26 DIAGNOSIS — C3490 Malignant neoplasm of unspecified part of unspecified bronchus or lung: Secondary | ICD-10-CM | POA: Insufficient documentation

## 2017-06-26 DIAGNOSIS — K449 Diaphragmatic hernia without obstruction or gangrene: Secondary | ICD-10-CM | POA: Diagnosis not present

## 2017-06-26 DIAGNOSIS — Z9889 Other specified postprocedural states: Secondary | ICD-10-CM

## 2017-06-26 MED ORDER — LIDOCAINE HCL 1 % IJ SOLN
INTRAMUSCULAR | Status: AC
  Filled 2017-06-26: qty 10

## 2017-06-26 MED FILL — TAGRISSO 80 MG TABLET: 80 | 30 days supply | Qty: 30 | Fill #2

## 2017-06-26 NOTE — Procedures (Signed)
Ultrasound-guided  therapeutic left thoracentesis performed yielding 1.5 liters of yellow fluid. No immediate complications. Follow-up chest x-ray pending.

## 2017-07-02 DIAGNOSIS — E782 Mixed hyperlipidemia: Secondary | ICD-10-CM | POA: Diagnosis not present

## 2017-07-02 DIAGNOSIS — I25119 Atherosclerotic heart disease of native coronary artery with unspecified angina pectoris: Secondary | ICD-10-CM | POA: Diagnosis not present

## 2017-07-02 DIAGNOSIS — R5383 Other fatigue: Secondary | ICD-10-CM | POA: Diagnosis not present

## 2017-07-02 DIAGNOSIS — C3430 Malignant neoplasm of lower lobe, unspecified bronchus or lung: Secondary | ICD-10-CM | POA: Diagnosis not present

## 2017-07-02 DIAGNOSIS — F0390 Unspecified dementia without behavioral disturbance: Secondary | ICD-10-CM | POA: Diagnosis not present

## 2017-07-07 DIAGNOSIS — Z9181 History of falling: Secondary | ICD-10-CM | POA: Diagnosis not present

## 2017-07-07 DIAGNOSIS — F418 Other specified anxiety disorders: Secondary | ICD-10-CM | POA: Diagnosis not present

## 2017-07-07 DIAGNOSIS — B029 Zoster without complications: Secondary | ICD-10-CM | POA: Diagnosis not present

## 2017-07-07 DIAGNOSIS — K5732 Diverticulitis of large intestine without perforation or abscess without bleeding: Secondary | ICD-10-CM | POA: Diagnosis not present

## 2017-07-07 DIAGNOSIS — M6281 Muscle weakness (generalized): Secondary | ICD-10-CM | POA: Diagnosis not present

## 2017-07-07 DIAGNOSIS — N400 Enlarged prostate without lower urinary tract symptoms: Secondary | ICD-10-CM | POA: Diagnosis not present

## 2017-07-07 DIAGNOSIS — Z86010 Personal history of colonic polyps: Secondary | ICD-10-CM | POA: Diagnosis not present

## 2017-07-07 DIAGNOSIS — I2510 Atherosclerotic heart disease of native coronary artery without angina pectoris: Secondary | ICD-10-CM | POA: Diagnosis not present

## 2017-07-07 DIAGNOSIS — D509 Iron deficiency anemia, unspecified: Secondary | ICD-10-CM | POA: Diagnosis not present

## 2017-07-07 DIAGNOSIS — R2689 Other abnormalities of gait and mobility: Secondary | ICD-10-CM | POA: Diagnosis not present

## 2017-07-07 DIAGNOSIS — R2681 Unsteadiness on feet: Secondary | ICD-10-CM | POA: Diagnosis not present

## 2017-07-07 DIAGNOSIS — E611 Iron deficiency: Secondary | ICD-10-CM | POA: Diagnosis not present

## 2017-07-07 DIAGNOSIS — R0600 Dyspnea, unspecified: Secondary | ICD-10-CM | POA: Diagnosis not present

## 2017-07-07 DIAGNOSIS — R55 Syncope and collapse: Secondary | ICD-10-CM | POA: Diagnosis not present

## 2017-07-15 ENCOUNTER — Encounter: Payer: Self-pay | Admitting: *Deleted

## 2017-07-15 DIAGNOSIS — R2681 Unsteadiness on feet: Secondary | ICD-10-CM | POA: Diagnosis not present

## 2017-07-15 DIAGNOSIS — R2689 Other abnormalities of gait and mobility: Secondary | ICD-10-CM | POA: Diagnosis not present

## 2017-07-15 DIAGNOSIS — I2510 Atherosclerotic heart disease of native coronary artery without angina pectoris: Secondary | ICD-10-CM | POA: Diagnosis not present

## 2017-07-15 DIAGNOSIS — F418 Other specified anxiety disorders: Secondary | ICD-10-CM | POA: Diagnosis not present

## 2017-07-15 DIAGNOSIS — M6281 Muscle weakness (generalized): Secondary | ICD-10-CM | POA: Diagnosis not present

## 2017-07-15 DIAGNOSIS — Z9181 History of falling: Secondary | ICD-10-CM | POA: Diagnosis not present

## 2017-07-16 ENCOUNTER — Encounter: Payer: Self-pay | Admitting: Nurse Practitioner

## 2017-07-16 ENCOUNTER — Ambulatory Visit: Payer: Medicare Other | Admitting: Nurse Practitioner

## 2017-07-16 DIAGNOSIS — F320 Major depressive disorder, single episode, mild: Secondary | ICD-10-CM | POA: Diagnosis not present

## 2017-07-16 DIAGNOSIS — I10 Essential (primary) hypertension: Secondary | ICD-10-CM | POA: Diagnosis not present

## 2017-07-16 DIAGNOSIS — W19XXXS Unspecified fall, sequela: Secondary | ICD-10-CM | POA: Diagnosis not present

## 2017-07-16 DIAGNOSIS — C3412 Malignant neoplasm of upper lobe, left bronchus or lung: Secondary | ICD-10-CM | POA: Diagnosis not present

## 2017-07-16 DIAGNOSIS — W19XXXA Unspecified fall, initial encounter: Secondary | ICD-10-CM | POA: Insufficient documentation

## 2017-07-16 DIAGNOSIS — I25709 Atherosclerosis of coronary artery bypass graft(s), unspecified, with unspecified angina pectoris: Secondary | ICD-10-CM | POA: Diagnosis not present

## 2017-07-16 DIAGNOSIS — N184 Chronic kidney disease, stage 4 (severe): Secondary | ICD-10-CM

## 2017-07-16 MED ORDER — AMLODIPINE BESYLATE 5 MG PO TABS: 5.0000 mg | ORAL_TABLET | Freq: Every day | ORAL | 0 refills | Status: DC

## 2017-07-16 NOTE — Progress Notes (Signed)
Provider:  Marlana Latus NP Location:   [facility]   Place of Service:   [facility]  PCP: Josetta Huddle, MD Patient Care Team: Josetta Huddle, MD as PCP - General (Internal Medicine)  Extended Emergency Contact Information Primary Emergency Contact: [facility] Address: [address]          [place], [place] [address] Johnnette Litter of [place] Phone: [phone] Relation: Spouse Secondary Emergency Contact: Tomeo,[name] Address: [place], [place] [address] Johnnette Litter of [place] Phone: [phone] Work Phone: [phone] Mobile Phone: [phone] Relation: Daughter  Code Status: DNR Goals of Care: Advanced Directive information Advanced Directives 04/09/2017  Does Patient Have a Medical Advance Directive? Yes  Type of Advance Directive -  Does patient want to make changes to medical advance directive? -  Copy of [place] in Chart? -  Would patient like information on creating a medical advance directive? -      Chief Complaint  Patient presents with  . Establish Care    HPI: Patient is a 84 y.o. male seen today for admission to [facility] clinic @ [facility] for establishment.   The patient has history of HTN, blood pressure is controlled on Lisinopril 20mg  daily. Last K 5.4, Bun 35, creat 2.30 06/20/17 from cancer center. He has lung cancer, left pleural effusion, s/p thoracentesis 06/26/17 with 1.5L fluid removed, on Osimertinib 80mg  po daily, next appointment at cancer center 07/2017. S/p CABG, denied chest pain, SOB, palpitation, on Isosorbide 30mg  daily, f;u Cardiology yearly. His mood is stable on Bupropion 150mg  daily, Devenlafaxine 25mg  daily. He is walking with cane, last fall was about a week or two ago at home when he attempted to pick up something from floor, no apparent injury.   Past Medical History:  Diagnosis Date  . Anxiety   . Bradycardia    a. BB d/c'd in 2016.  [name] CAD (coronary artery  disease)    a. 1983 s/p CABG [facility], [place]);  b. 2007 NSTEMI->med Rx for distal LCX dzs; c. 09/2014 NSTEMI/Cath: LIMA->LAD & diag ok, native RCA dominant and patent.   VG->RCA 100, VG->LCX 100 -->Med Rx.  . CKD (chronic kidney disease), stage III ([place])   . Claudication ([place])    BLE  . Depression   . Diastolic dysfunction    a. 10/2014 Echo: F 60-65%, Gr1 DD, mild AS, mild AI, mild to mod MR, PASP 30mmHg.  [name] Dyspnea 2010   syndrome - extensive  . Erectile dysfunction   . GERD (gastroesophageal reflux disease)   . H/O hiatal hernia   . History of blood transfusion 2014  . Hx of adenomatous colonic polyps 2007  . Hyperlipidemia   . Hypertensive heart disease   . Inguinal hernia   . Iron deficiency anemia   . Shingles 1990's  . Sigmoid diverticulosis   . Upper GI bleeding 2014   Gastritis and possible duodenal ulcer seen on EGD, felt secondary to NSAIDs  . Urethral stricture 11/2008  . Vasovagal syncope 05/2006  . Weight loss    Past Surgical History:  Procedure Laterality Date  . CARDIAC CATHETERIZATION  2007   Left main 100%, RCA 70% ostial, all grafts were patent, EF 40-45 percent, distal circumflex subtotal after graft insertion, medical therapy   . CARDIAC CATHETERIZATION N/A 10/14/2014   Procedure: Left Heart Cath and Cors/Grafts Angiography;  Surgeon: Lorretta Harp,  MD;  Location: [facility];  Service: Cardiovascular;  Laterality: N/A;  . CHOLECYSTECTOMY  1980's  . CORONARY ARTERY BYPASS GRAFT  1994    LIMA-LAD, SVG-D1, SVG-OM,  SVG-RCA; performed in [place], [place]  . ESOPHAGOGASTRODUODENOSCOPY N/A 03/25/2013   Procedure: ESOPHAGOGASTRODUODENOSCOPY (EGD);  Surgeon: Missy Sabins, MD;  gastritis, cannot rule out duodenal ulcer   . INGUINAL HERNIA REPAIR Left   . TRANSURETHRAL RESECTION OF PROSTATE      reports that  has never smoked. he has never used smokeless tobacco. He reports that he drinks about 4.2 oz of alcohol per week. He reports that he does not use  drugs. Social History   Socioeconomic History  . Marital status: Married    Spouse name: Not on file  . Number of children: Not on file  . Years of education: Not on file  . Highest education level: Not on file  Social Needs  . Financial resource strain: Not on file  . Food insecurity - worry: Not on file  . Food insecurity - inability: Not on file  . Transportation needs - medical: Not on file  . Transportation needs - non-medical: Not on file  Occupational History  . Occupation: Retired  Tobacco Use  . Smoking status: Never Smoker  . Smokeless tobacco: Never Used  Substance and Sexual Activity  . Alcohol use: Yes    Alcohol/week: 4.2 oz    Types: 7 Glasses of wine per week    Comment: wine - daily - currently not drinking  . Drug use: No  . Sexual activity: No    Birth control/protection: Abstinence  Other Topics Concern  . Not on file  Social History Narrative   He lives in [facility] independent [facility], with his wife.    Functional Status Survey:    Family History  Problem Relation Age of Onset  . Hypertension Mother   . Heart disease Mother   . Heart disease Father   . Heart failure Brother   . Heart failure Sister   . Heart failure Brother   . Heart attack Neg Hx   . Stroke Neg Hx     Health Maintenance  Topic Date Due  . PNA vac Low Risk Adult (2 of 2 - PCV13) 05/20/2018  . TETANUS/TDAP  07/11/2022  . INFLUENZA VACCINE  Completed    No Known Allergies  Allergies as of 07/16/2017   No Known Allergies     Medication List        Accurate as of 07/16/17 11:59 PM. Always use your most recent med list.          acetaminophen 325 MG tablet Commonly known as:  TYLENOL Take 2 tablets (650 mg total) by mouth every 4 (four) hours as needed for headache or mild pain.   amLODipine 5 MG tablet Commonly known as:  NORVASC Take 1 tablet (5 mg total) by mouth daily.   aspirin EC 81 MG tablet Take 81 mg by mouth daily as needed for moderate pain.    BREO ELLIPTA 100-25 MCG/INH Aepb Generic drug:  fluticasone furoate-vilanterol 1 PUFF BY MOUTH DAILY   buPROPion 150 MG 24 hr tablet Commonly known as:  WELLBUTRIN XL Take 150 mg by mouth daily.   FERROCITE 324 (106 Fe) MG Tabs tablet Generic drug:  Ferrous Fumarate TAKE 1 TABLET BY MOUTH QOD   FISH OIL PO Take 1 capsule by mouth daily.   isosorbide mononitrate 30 MG 24 hr tablet Commonly known as:  IMDUR Take 30 mg by mouth daily.   LYRICA 50 MG capsule Generic drug:  pregabalin Take 50 mg by mouth daily as needed (pain).   nitroGLYCERIN 0.4 MG SL tablet Commonly known as:  NITROSTAT Place 1 tablet (0.4 mg total) under the tongue every 5 (five) minutes x 3 doses as needed for chest pain.   osimertinib mesylate 80 MG tablet Commonly known as:  TAGRISSO Take 1 tablet (80 mg total) by mouth daily.   PRISTIQ 50 MG 24 hr tablet Generic drug:  desvenlafaxine Take 25 mg by mouth daily.   PROAIR HFA 108 (90 Base) MCG/ACT inhaler Generic drug:  albuterol Inhale 2 puffs into the lungs every 6 (six) hours as needed for wheezing or shortness of breath.   vitamin B-12 1000 MCG tablet Commonly known as:  CYANOCOBALAMIN Take 1,000 mcg by mouth every evening.       Review of Systems  Constitutional: Negative for activity change, appetite change, chills, diaphoresis, fatigue and fever.  HENT: Positive for hearing loss. Negative for congestion, trouble swallowing and voice change.   Eyes: Negative for visual disturbance.  Respiratory: Negative for apnea, cough, choking, chest tightness, shortness of breath and wheezing.   Cardiovascular: Negative for chest pain, palpitations and leg swelling.  Gastrointestinal: Negative for abdominal distention, abdominal pain, constipation, diarrhea, nausea and vomiting.  Endocrine: Negative for cold intolerance.  Genitourinary: Negative for difficulty urinating, dysuria and urgency.  Musculoskeletal: Negative for back pain, gait problem and  joint swelling.  Skin: Negative for color change, pallor and rash.  Neurological: Negative for dizziness, tremors, speech difficulty, light-headedness and headaches.  Psychiatric/Behavioral: Negative for agitation, behavioral problems, confusion, hallucinations and sleep disturbance. The patient is not nervous/anxious.     Vitals:   07/16/17 0932  BP: 138/72  Pulse: 78  Resp: 20  Temp: (!) 97.2 F (36.2 C)  TempSrc: Oral  SpO2: 94%  Weight: 148 lb 3.2 oz (67.2 kg)  Height: 5\' 6"  (1.676 m)   Body mass index is 23.92 kg/m. Physical Exam  Constitutional: He is oriented to person, place, and time. He appears well-developed and well-nourished. No distress.  HENT:  Head: Normocephalic and atraumatic.  Eyes: Conjunctivae and EOM are normal. Pupils are equal, round, and reactive to light.  Neck: Normal range of motion. Neck supple. No JVD present. No thyromegaly present.  Cardiovascular: Normal rate, regular rhythm and normal heart sounds.  No murmur heard. Pulmonary/Chest: Effort normal. He has no wheezes. He has no rales. He exhibits no tenderness.  Decreased air entry left lung  Abdominal: Soft. Bowel sounds are normal. He exhibits no distension. There is no tenderness. There is no rebound and no guarding.  Musculoskeletal: Normal range of motion. He exhibits no edema or tenderness.  Ambulates with cane  Neurological: He is alert and oriented to person, place, and time. He exhibits normal muscle tone. Coordination normal.  Skin: Skin is warm and dry. No rash noted. He is not diaphoretic. No erythema.  Mid sternum surgical scar  Psychiatric: He has a normal mood and affect. His behavior is normal. Judgment and thought content normal.    Labs reviewed: Basic Metabolic Panel: Recent Labs    03/03/17 1638 04/09/17 1352 05/01/17 1246 05/23/17 0947 05/23/17 0948 06/20/17 1054  NA 137 139 140  --  140 140  K 5.3* 4.5 4.7  --  5.0 5.4*  CL 107 106  --   --   --  107  CO2 23 26  24   --  24 26  GLUCOSE 95 109* 90  --  81 104  BUN 41* 34* 34.9*  --  41.7* 35*  CREATININE 2.10* 1.98* 2.3*  --  2.5* 2.30*  CALCIUM 9.1 8.9 9.8  --  9.2 9.3  MG  --   --   --   --  3.2* 3.0*  PHOS  --   --   --  3.7  --  3.4   Liver Function Tests: Recent Labs    05/01/17 1246 05/23/17 0948 06/20/17 1054  AST 13 15 20   ALT 7 16 24   ALKPHOS 75 67 58  BILITOT 0.42 0.41 0.4  PROT 7.2 6.9 6.7  ALBUMIN 3.7 3.6 3.5   Recent Labs    04/09/17 1352  LIPASE 48   No results for input(s): AMMONIA in the last 8760 hours. CBC: Recent Labs    04/09/17 1352 05/01/17 1246 05/23/17 0948 06/20/17 1054  WBC 9.0 8.8 8.4 5.7  NEUTROABS 6.2 6.0 6.2 3.7  HGB 13.2 14.0 13.5  --   HCT 41.0 43.9 41.9 42.9  MCV 88.0 88.2 87.4 90.7  PLT 201 193 122* 103*   Cardiac Enzymes: No results for input(s): CKTOTAL, CKMB, CKMBINDEX, TROPONINI in the last 8760 hours. BNP: Invalid input(s): POCBNP No results found for: HGBA1C Lab Results  Component Value Date   TSH 2.186 01/22/2016   Lab Results  Component Value Date   WIOXBDZH29 924 01/23/2016   No results found for: FOLATE No results found for: IRON, TIBC, FERRITIN  Imaging and Procedures obtained prior to SNF admission: Dg Chest 1 View  Result Date: 06/26/2017 CLINICAL DATA:  Status post left thoracentesis. EXAM: CHEST 1 VIEW COMPARISON:  06/20/2017 FINDINGS: Cardiac shadow is stable. Large hiatal hernia is again seen. Postsurgical changes are noted. No pneumothorax is noted following left thoracentesis. Minimal residual fluid is noted. The right lung is clear. No bony abnormality is noted. IMPRESSION: No evidence of pneumothorax following thoracentesis. Electronically Signed   By: Inez Catalina M.D.   On: 06/26/2017 14:46   US Thoracentesis Asp Pleural Space W/img Guide  Result Date: 06/26/2017 INDICATION: Patient with history of lung cancer and recurrent malignant left pleural effusion. Request made for therapeutic left thoracentesis.  EXAM: ULTRASOUND GUIDED THERAPEUTIC LEFT THORACENTESIS MEDICATIONS: None. COMPLICATIONS: None immediate. PROCEDURE: An ultrasound guided thoracentesis was thoroughly discussed with the patient and questions answered. The benefits, risks, alternatives and complications were also discussed. The patient understands and wishes to proceed with the procedure. Written consent was obtained. Ultrasound was performed to localize and mark an adequate pocket of fluid in the left chest. The area was then prepped and draped in the normal sterile fashion. 1% Lidocaine was used for local anesthesia. Under ultrasound guidance a 6 Fr Safe-T-Centesis catheter was introduced. Thoracentesis was performed. The catheter was removed and a dressing applied. FINDINGS: A total of approximately 1.5 liters of yellow fluid was removed. IMPRESSION: Successful ultrasound guided therapeutic left thoracentesis yielding 1.5 liters of pleural fluid. Postprocedure chest x-ray revealed no pneumothorax. Read by: Rowe Robert, PA-C Electronically Signed   By: Lucrezia Europe M.D.   On: 06/26/2017 14:45    Assessment/Plan  Essential hypertension The patient has history of HTN, blood pressure is controlled on Lisinopril 20mg  daily. Last K 5.4, Bun 35, creat 2.30 06/20/17 from cancer center. Will dc Lisinopril, start Amlodipine 5mg  daily, monitor Bp/P daily, he has history of bradycardia with Metoprolol use.   CKD (chronic kidney disease), stage IV (H[facility]) Last K 5.4, Bun 35,  creat 2.30 06/20/17, dc Lisinopril, the patient has appointment at cancer center in March 2019 and the patient stated he has labs there, will obtain BMP here if no labs done at cancer center in March.   Primary adenocarcinoma of upper lobe of left lung ([place]) He has lung cancer, left pleural effusion, s/p thoracentesis 06/26/17 with 1.5L fluid removed, on Osimertinib 80mg  po daily, next appointment at cancer center 07/2017.   CAD (coronary artery disease) of artery bypass graft, ALL VGs  occluded, patent LIMA -->LAD, patent dominant RCA S/p CABG, denied chest pain, SOB, palpitation, continue Isosorbide 30mg  daily, prn NGT,  f;u Cardiology yearly.  Depression His mood is stable, continue Bupropion 150mg  daily, Devenlafaxine 25mg  daily.  Fall He is walking with cane, last fall was about a week or two ago at home when he attempted to pick up something from floor, no apparent injury.    Family/ staff Communication: plan of care reviewed with the patient and his wife.   Labs/tests ordered: none  Time spend 60 minutes   Next appointment 3 months.

## 2017-07-16 NOTE — Assessment & Plan Note (Signed)
Last K 5.4, Bun 35, creat 2.30 06/20/17, dc Lisinopril, the patient has appointment at cancer center in March 2019 and the patient stated he has labs there, will obtain BMP here if no labs done at cancer center in March.

## 2017-07-16 NOTE — Assessment & Plan Note (Addendum)
The patient has history of HTN, blood pressure is controlled on Lisinopril 20mg  daily. Last K 5.4, Bun 35, creat 2.30 06/20/17 from cancer center. Will dc Lisinopril, start Amlodipine 5mg  daily, monitor Bp/P daily, he has history of bradycardia with Metoprolol use.

## 2017-07-16 NOTE — Assessment & Plan Note (Signed)
He is walking with cane, last fall was about a week or two ago at home when he attempted to pick up something from floor, no apparent injury.

## 2017-07-16 NOTE — Assessment & Plan Note (Signed)
He has lung cancer, left pleural effusion, s/p thoracentesis 06/26/17 with 1.5L fluid removed, on Osimertinib 80mg  po daily, next appointment at cancer center 07/2017.

## 2017-07-16 NOTE — Assessment & Plan Note (Signed)
His mood is stable, continue Bupropion 150mg  daily, Devenlafaxine 25mg  daily.

## 2017-07-16 NOTE — Assessment & Plan Note (Signed)
S/p CABG, denied chest pain, SOB, palpitation, continue Isosorbide 30mg  daily, prn NGT,  f;u Cardiology yearly.

## 2017-07-16 NOTE — Patient Instructions (Signed)
Discontinue Lisinopril, start Amlodipine 5mg  po daily, monitor Bp/P daily. Return in 3 months. May consider BMP f/u if not done in cancer center in March 2019

## 2017-07-21 DIAGNOSIS — K5732 Diverticulitis of large intestine without perforation or abscess without bleeding: Secondary | ICD-10-CM | POA: Diagnosis not present

## 2017-07-21 DIAGNOSIS — E611 Iron deficiency: Secondary | ICD-10-CM | POA: Diagnosis not present

## 2017-07-21 DIAGNOSIS — N400 Enlarged prostate without lower urinary tract symptoms: Secondary | ICD-10-CM | POA: Diagnosis not present

## 2017-07-21 DIAGNOSIS — R55 Syncope and collapse: Secondary | ICD-10-CM | POA: Diagnosis not present

## 2017-07-21 DIAGNOSIS — Z86010 Personal history of colonic polyps: Secondary | ICD-10-CM | POA: Diagnosis not present

## 2017-07-21 DIAGNOSIS — F418 Other specified anxiety disorders: Secondary | ICD-10-CM | POA: Diagnosis not present

## 2017-07-21 DIAGNOSIS — D509 Iron deficiency anemia, unspecified: Secondary | ICD-10-CM | POA: Diagnosis not present

## 2017-07-21 DIAGNOSIS — R2689 Other abnormalities of gait and mobility: Secondary | ICD-10-CM | POA: Diagnosis not present

## 2017-07-21 DIAGNOSIS — M6281 Muscle weakness (generalized): Secondary | ICD-10-CM | POA: Diagnosis not present

## 2017-07-21 DIAGNOSIS — Z9181 History of falling: Secondary | ICD-10-CM | POA: Diagnosis not present

## 2017-07-21 DIAGNOSIS — R2681 Unsteadiness on feet: Secondary | ICD-10-CM | POA: Diagnosis not present

## 2017-07-21 DIAGNOSIS — R0600 Dyspnea, unspecified: Secondary | ICD-10-CM | POA: Diagnosis not present

## 2017-07-21 DIAGNOSIS — B029 Zoster without complications: Secondary | ICD-10-CM | POA: Diagnosis not present

## 2017-07-22 ENCOUNTER — Other Ambulatory Visit: Payer: Self-pay | Admitting: Hematology

## 2017-07-22 ENCOUNTER — Encounter: Payer: Self-pay | Admitting: Internal Medicine

## 2017-07-22 ENCOUNTER — Non-Acute Institutional Stay: Payer: Medicare Other | Admitting: Internal Medicine

## 2017-07-22 VITALS — BP 136/74 | HR 88 | Temp 97.7°F | Resp 16 | Ht 66.0 in | Wt 147.4 lb

## 2017-07-22 DIAGNOSIS — N184 Chronic kidney disease, stage 4 (severe): Secondary | ICD-10-CM | POA: Diagnosis not present

## 2017-07-22 DIAGNOSIS — I951 Orthostatic hypotension: Secondary | ICD-10-CM | POA: Diagnosis not present

## 2017-07-22 DIAGNOSIS — C3412 Malignant neoplasm of upper lobe, left bronchus or lung: Secondary | ICD-10-CM | POA: Diagnosis not present

## 2017-07-22 DIAGNOSIS — I25709 Atherosclerosis of coronary artery bypass graft(s), unspecified, with unspecified angina pectoris: Secondary | ICD-10-CM

## 2017-07-22 DIAGNOSIS — K625 Hemorrhage of anus and rectum: Secondary | ICD-10-CM | POA: Diagnosis not present

## 2017-07-22 DIAGNOSIS — R0609 Other forms of dyspnea: Secondary | ICD-10-CM

## 2017-07-22 DIAGNOSIS — C3490 Malignant neoplasm of unspecified part of unspecified bronchus or lung: Secondary | ICD-10-CM

## 2017-07-22 LAB — BASIC METABOLIC PANEL
BUN: 45 — AB (ref 4–21)
CREATININE: 2.3 — AB (ref 0.6–1.3)
Glucose: 123
Potassium: 4.9 (ref 3.4–5.3)
SODIUM: 139 (ref 137–147)

## 2017-07-22 LAB — CMP 10231
ALBUMIN: 3.6
Calcium: 9.3
Carbon Dioxide, Total: 26
Chloride: 106
EGFR (Non-African Amer.): 24
Globulin: 2.1
TOTAL PROTEIN: 5.7

## 2017-07-22 LAB — HEPATIC FUNCTION PANEL
ALT: 15 (ref 10–40)
AST: 18 (ref 14–40)
Alkaline Phosphatase: 52 (ref 25–125)
Bilirubin, Total: 0.5

## 2017-07-22 LAB — CBC AND DIFFERENTIAL
HCT: 37 — AB (ref 41–53)
Hemoglobin: 12.1 — AB (ref 13.5–17.5)
Platelets: 119 — AB (ref 150–399)
WBC: 7.1

## 2017-07-22 NOTE — Patient Instructions (Signed)
  [facility] I am ordering some lab work to make sure you have not had a significant amount of blood loss in your stool. We will also need to investigate the reason for your bleed.

## 2017-07-22 NOTE — Progress Notes (Addendum)
[facility]  Provider: [name] MD   Location:      Place of Service:     PCP: [name], MD Patient Care Team: [name], MD as PCP - General (Internal Medicine) Mast, [name], NP as Nurse Practitioner (Internal Medicine)  Extended Emergency Contact Information Primary Emergency Contact: Efthemios Raphtis Md Pc Address: [address]          [place], [place] [address] Johnnette Litter of [place] Phone: [phone] Relation: Spouse Secondary Emergency Contact: Weir,[name] Address: [place], [place] [address] Johnnette Litter of [place] Phone: [phone] Work Phone: [phone] Mobile Phone: [phone] Relation: Daughter   Goals of Care: Advanced Directive information Advanced Directives 04/09/2017  Does Patient Have a Medical Advance Directive? Yes  Type of Advance Directive -  Does patient want to make changes to medical advance directive? -  Copy of [place] in Chart? -  Would patient like information on creating a medical advance directive? -      Chief Complaint  Patient presents with  . Acute Visit    blood in stool   . Medication Refill    No refills needed at this time    HPI: Patient is a 84 y.o. male seen today for acute visit for blood in stool. 2 days back he noticed small amount of blood with stool. He had loose stool and some fresh blood in it. He then had another bowel movement yesterday with large amount of fresh blood in stool yesterday. He has not had a bowel movement today. No blood in urine. No bleeding reported from elsewhere. He denies any nausea or vomiting. Denies abdominal pain or cramp this visit.  On chart review, he has medical history of primary adenocarcinoma of upper lobe of left lung and is currently on antineoplastic agent. He also has history of CAD s/p CABG, bradycardia, hiatal hernia and CKD stage 4 among others.  Wife present  during the visit  Past Medical History:  Diagnosis Date  . Anxiety   . Bradycardia    a. BB d/c'd in 2016.  [name] CAD (coronary artery disease)    a. 1983 s/p CABG [facility], [place]);  b. 2007 NSTEMI->med Rx for distal LCX dzs; c. 09/2014 NSTEMI/Cath: LIMA->LAD & diag ok, native RCA dominant and patent.   VG->RCA 100, VG->LCX 100 -->Med Rx.  . CKD (chronic kidney disease), stage III ([place])   . Claudication ([place])    BLE  . Depression   . Diastolic dysfunction    a. 10/2014 Echo: F 60-65%, Gr1 DD, mild AS, mild AI, mild to mod MR, PASP 37mmHg.  [name] Dyspnea 2010   syndrome - extensive  . Erectile dysfunction   . GERD (gastroesophageal reflux disease)   . H/O hiatal hernia   . History of blood transfusion 2014  . Hx of adenomatous colonic polyps 2007  . Hyperlipidemia   . Hypertensive heart disease   . Inguinal hernia   . Iron deficiency anemia   . Shingles 1990's  . Sigmoid diverticulosis   . Upper GI bleeding 2014   Gastritis and possible duodenal ulcer seen on EGD, felt secondary to NSAIDs  . Urethral stricture 11/2008  . Vasovagal syncope 05/2006  . Weight loss    Past Surgical History:  Procedure Laterality Date  . CARDIAC CATHETERIZATION  2007   Left main 100%, RCA  70% ostial, all grafts were patent, EF 40-45 percent, distal circumflex subtotal after graft insertion, medical therapy   . CARDIAC CATHETERIZATION N/A 10/14/2014   Procedure: Left Heart Cath and Cors/Grafts Angiography;  Surgeon: Lorretta Harp, MD;  Location: [facility];  Service: Cardiovascular;  Laterality: N/A;  . CHOLECYSTECTOMY  1980's  . CORONARY ARTERY BYPASS GRAFT  1994    LIMA-LAD, SVG-D1, SVG-OM,  SVG-RCA; performed in [place], [place]  . ESOPHAGOGASTRODUODENOSCOPY N/A 03/25/2013   Procedure: ESOPHAGOGASTRODUODENOSCOPY (EGD);  Surgeon: Missy Sabins, MD;  gastritis, cannot rule out duodenal ulcer   . INGUINAL HERNIA REPAIR Left   . TRANSURETHRAL RESECTION OF PROSTATE      reports that  has  never smoked. he has never used smokeless tobacco. He reports that he drinks about 4.2 oz of alcohol per week. He reports that he does not use drugs. Social History   Socioeconomic History  . Marital status: Married    Spouse name: Not on file  . Number of children: Not on file  . Years of education: Not on file  . Highest education level: Not on file  Social Needs  . Financial resource strain: Not on file  . Food insecurity - worry: Not on file  . Food insecurity - inability: Not on file  . Transportation needs - medical: Not on file  . Transportation needs - non-medical: Not on file  Occupational History  . Occupation: Retired  Tobacco Use  . Smoking status: Never Smoker  . Smokeless tobacco: Never Used  Substance and Sexual Activity  . Alcohol use: Yes    Alcohol/week: 4.2 oz    Types: 7 Glasses of wine per week    Comment: wine - daily - currently not drinking  . Drug use: No  . Sexual activity: No    Birth control/protection: Abstinence  Other Topics Concern  . Not on file  Social History Narrative   He lives in [facility], independent living, with his wife.    Functional Status Survey:    Family History  Problem Relation Age of Onset  . Hypertension Mother   . Heart disease Mother   . Heart disease Father   . Heart failure Brother   . Heart failure Sister   . Heart failure Brother   . Heart attack Neg Hx   . Stroke Neg Hx     Health Maintenance  Topic Date Due  . PNA vac Low Risk Adult (2 of 2 - PCV13) 05/20/2018  . TETANUS/TDAP  07/11/2022  . INFLUENZA VACCINE  Completed    No Known Allergies  Outpatient Encounter Medications as of 07/22/2017  Medication Sig  . acetaminophen (TYLENOL) 325 MG tablet Take 2 tablets (650 mg total) by mouth every 4 (four) hours as needed for headache or mild pain.  [name] albuterol (PROAIR HFA) 108 (90 Base) MCG/ACT inhaler Inhale 2 puffs into the lungs every 6 (six) hours as needed for wheezing or shortness of breath.  [name]  amLODipine (NORVASC) 5 MG tablet Take 1 tablet (5 mg total) by mouth daily.  [name] BREO ELLIPTA 100-25 MCG/INH AEPB 1 PUFF BY MOUTH DAILY  . buPROPion (WELLBUTRIN XL) 150 MG 24 hr tablet Take 150 mg by mouth daily.   [name] desvenlafaxine (PRISTIQ) 50 MG 24 hr tablet Take 25 mg by mouth daily.   [name] FERROCITE 324 MG TABS tablet TAKE 1 TABLET BY MOUTH QOD  . isosorbide mononitrate (IMDUR) 30 MG 24 hr tablet Take 30 mg by mouth daily.  [name]  LYRICA 50 MG capsule Take 50 mg by mouth daily as needed (pain).   . nitroGLYCERIN (NITROSTAT) 0.4 MG SL tablet Place 1 tablet (0.4 mg total) under the tongue every 5 (five) minutes x 3 doses as needed for chest pain.  . Omega-3 Fatty Acids (FISH OIL PO) Take 1 capsule by mouth daily.  [name] osimertinib mesylate (TAGRISSO) 80 MG tablet Take 1 tablet (80 mg total) by mouth daily.  . vitamin B-12 (CYANOCOBALAMIN) 1000 MCG tablet Take 1,000 mcg by mouth every evening.   . [DISCONTINUED] aspirin EC 81 MG tablet Take 81 mg by mouth daily as needed for moderate pain.    No facility-administered encounter medications on file as of 07/22/2017.     Review of Systems  Constitutional: Positive for appetite change and fatigue. Negative for chills and fever.  Respiratory: Negative for wheezing.        Was short of breath on his walk from apartment to office today and had to take a break. It takes him 10 minutes to get here and took him 15 minutes today  Cardiovascular: Negative for chest pain and palpitations.  Gastrointestinal: Positive for blood in stool. Negative for abdominal pain, diarrhea, nausea, rectal pain and vomiting.  Genitourinary: Negative for dysuria and hematuria.  Musculoskeletal: Positive for gait problem. Negative for back pain.       Uses a cane, last fall on 07/03/17  Neurological: Positive for dizziness. Negative for numbness and headaches.  Psychiatric/Behavioral: Positive for confusion.       Has some confusion at baseline per wife    Vitals:   07/22/17 1008    BP: 136/74  Pulse: 88  Resp: 16  Temp: 97.7 F (36.5 C)  TempSrc: Oral  SpO2: 95%  Weight: 147 lb 6.4 oz (66.9 kg)  Height: 5\' 6"  (1.676 m)   Body mass index is 23.79 kg/m.   Wt Readings from Last 3 Encounters:  07/22/17 147 lb 6.4 oz (66.9 kg)  07/16/17 148 lb 3.2 oz (67.2 kg)  06/20/17 152 lb 11.2 oz (69.3 kg)   Lying down- HR 79, BP 126/80. Sitting 110/66, 83. Standing 108/70, 103/min  Physical Exam  Constitutional:  Frail, elderly male  HENT:  Head: Normocephalic and atraumatic.  Mouth/Throat: Oropharynx is clear and moist. No oropharyngeal exudate.  Eyes: Conjunctivae and EOM are normal. Pupils are equal, round, and reactive to light. Right eye exhibits no discharge. Left eye exhibits no discharge. No scleral icterus.  Pallor present  Neck: Normal range of motion. Neck supple.  Cardiovascular: Normal rate and regular rhythm.  Pulmonary/Chest: No respiratory distress.  Increased effort to breathe, poor air movement, no wheeze or crackles  Abdominal: Soft. Bowel sounds are normal. There is no tenderness. There is no rebound and no guarding.  Genitourinary:  Genitourinary Comments: No external hemorrhoids or anal tags noted. No palpable mass on digital rectal exam. No frank blood in finger but stool grossly positive for blood on guaiac test  Musculoskeletal: He exhibits no edema.  Able to move all 4 extremities  Lymphadenopathy:    He has no cervical adenopathy.  Neurological: He is alert.  Confused at times about date and time.   Skin: Skin is warm and dry. No rash noted. He is not diaphoretic.  Psychiatric: He has a normal mood and affect.    Labs reviewed: Basic Metabolic Panel: Recent Labs    03/03/17 1638 04/09/17 1352 05/01/17 1246 05/23/17 0947 05/23/17 0948 06/20/17 1054  NA 137 139 140  --  140 140  K 5.3* 4.5 4.7  --  5.0 5.4*  CL 107 106  --   --   --  107  CO2 23 26 24   --  24 26  GLUCOSE 95 109* 90  --  81 104  BUN 41* 34* 34.9*  --  41.7*  35*  CREATININE 2.10* 1.98* 2.3*  --  2.5* 2.30*  CALCIUM 9.1 8.9 9.8  --  9.2 9.3  MG  --   --   --   --  3.2* 3.0*  PHOS  --   --   --  3.7  --  3.4   Liver Function Tests: Recent Labs    05/01/17 1246 05/23/17 0948 06/20/17 1054  AST 13 15 20   ALT 7 16 24   ALKPHOS 75 67 58  BILITOT 0.42 0.41 0.4  PROT 7.2 6.9 6.7  ALBUMIN 3.7 3.6 3.5   Recent Labs    04/09/17 1352  LIPASE 48   No results for input(s): AMMONIA in the last 8760 hours. CBC: Recent Labs    04/09/17 1352 05/01/17 1246 05/23/17 0948 06/20/17 1054  WBC 9.0 8.8 8.4 5.7  NEUTROABS 6.2 6.0 6.2 3.7  HGB 13.2 14.0 13.5  --   HCT 41.0 43.9 41.9 42.9  MCV 88.0 88.2 87.4 90.7  PLT 201 193 122* 103*   Cardiac Enzymes: No results for input(s): CKTOTAL, CKMB, CKMBINDEX, TROPONINI in the last 8760 hours. BNP: Invalid input(s): POCBNP No results found for: HGBA1C Lab Results  Component Value Date   TSH 2.186 01/22/2016   Lab Results  Component Value Date   [id number] 591 01/23/2016   No results found for: FOLATE No results found for: IRON, TIBC, FERRITIN  Lipid Panel: No results for input(s): CHOL, HDL, LDLCALC, TRIG, CHOLHDL, LDLDIRECT in the last 8760 hours. No results found for: HGBA1C  Procedures since last visit: Dg Chest 1 View  Result Date: 06/26/2017 CLINICAL DATA:  Status post left thoracentesis. EXAM: CHEST 1 VIEW COMPARISON:  06/20/2017 FINDINGS: Cardiac shadow is stable. Large hiatal hernia is again seen. Postsurgical changes are noted. No pneumothorax is noted following left thoracentesis. Minimal residual fluid is noted. The right lung is clear. No bony abnormality is noted. IMPRESSION: No evidence of pneumothorax following thoracentesis. Electronically Signed   By: Inez Catalina M.D.   On: 06/26/2017 14:46   US Thoracentesis Asp Pleural Space W/img Guide  Result Date: 06/26/2017 INDICATION: Patient with history of lung cancer and recurrent malignant left pleural effusion. Request made for  therapeutic left thoracentesis. EXAM: ULTRASOUND GUIDED THERAPEUTIC LEFT THORACENTESIS MEDICATIONS: None. COMPLICATIONS: None immediate. PROCEDURE: An ultrasound guided thoracentesis was thoroughly discussed with the patient and questions answered. The benefits, risks, alternatives and complications were also discussed. The patient understands and wishes to proceed with the procedure. Written consent was obtained. Ultrasound was performed to localize and mark an adequate pocket of fluid in the left chest. The area was then prepped and draped in the normal sterile fashion. 1% Lidocaine was used for local anesthesia. Under ultrasound guidance a 6 Fr Safe-T-Centesis catheter was introduced. Thoracentesis was performed. The catheter was removed and a dressing applied. FINDINGS: A total of approximately 1.5 liters of yellow fluid was removed. IMPRESSION: Successful ultrasound guided therapeutic left thoracentesis yielding 1.5 liters of pleural fluid. Postprocedure chest x-ray revealed no pneumothorax. Read by: Rowe Robert, PA-C Electronically Signed   By: Lucrezia Europe M.D.   On: 06/26/2017 14:45    Assessment/Plan  1. Rectal bleed Hold aspirin  for now with his bleed. Stat cbc, cmp. If hemoglobin <7, send to hospital for ongoing bleed and blood transfusion. If hemoglobin stable, will obtain GI consult. Concern for metastases from his primary cancer site vs upper GI bleed needs to be ruled out.   2. Dyspnea on exertion With bleeding and known history of adenocarcinoma of lung. Oxygenation good. Continue breo and albuterol for now  3. Primary adenocarcinoma of upper lobe of left lung ([place]) Continue his current regimen, no changes made. Oxygen if needed for supportive care if o2 sat <90%  4. CKD (chronic kidney disease), stage IV ([facility]) Check BMP  5. Orthostatic hypotension Orthostatic by both BP and HR. Stat labs and consider IV fluids if needed. Add holding parameter to his imdur and amlodipine for  now  [facility]. Charge nurse aware of this. New orders placed and FL2 form signed.   Labs/tests ordered:  Stat cbc and cmp  Communication: reviewed care plan with patient, his wife and charge nurse.     [name], MD Internal Medicine [facility] [address] [place], [place] [address] Cell Phone (Monday-Friday 8 am - 5 pm): [phone] On Call: [phone] and follow prompts after 5 pm and on weekends Office Phone: [phone] Office Fax: [phone]  CBC Latest Ref Rng & Units 07/22/2017 06/20/2017 05/23/2017  WBC - 7.1 5.7 8.4  Hemoglobin 13.5 - 17.5 12.1(A) - 13.5  Hematocrit 41 - 53 37(A) 42.9 41.9  Platelets 150 - 399 119(A) 103(L) 122(L)   CMP Latest Ref Rng & Units 07/22/2017 06/20/2017 05/23/2017  Glucose 70 - 140 mg/dL - 104 81  BUN 4 - 21 45(A) 35(H) 41.7(H)  Creatinine 0.6 - 1.3 2.3(A) 2.30(H) 2.5(H)  Sodium 137 - 147 139 140 140  Potassium 3.4 - 5.3 4.9 5.4(H) 5.0  Chloride - 106 107 -  CO2 - 26 26 24   Calcium - 9.3 9.3 9.2  Total Protein - 5.7 6.7 6.9  Total Bilirubin 0.2 - 1.2 mg/dL - 0.4 0.41  Alkaline Phos 25 - 125 52 58 67  AST 14 - 40 18 20 15   ALT 10 - 40 15 24 16     Patient to be admitted to SNF for now. Will have him started on iv fluids normal saline @ 100 cc/hr for 1 liter given his elevated BUN and Creatinine with his orthostasis. Hemoglobin shows a slight drop but platelet has a rise- ? Hemoconcentration. Recheck cbc and bmp in am. GI consult ASAP to assess for bleeding. New orders have been provided to nursing. Continue to monitor vital signs including orthostatics for now.    [name], MD  [facility] [phone] (Monday-Friday 8 am - 5 pm) [phone] (afterhours)

## 2017-07-22 NOTE — Addendum Note (Signed)
Addended byBlanchie Serve on: 07/22/2017 04:30 PM   Modules accepted: Orders

## 2017-07-23 ENCOUNTER — Encounter: Payer: Self-pay | Admitting: Physician Assistant

## 2017-07-23 DIAGNOSIS — K625 Hemorrhage of anus and rectum: Secondary | ICD-10-CM | POA: Diagnosis not present

## 2017-07-23 LAB — CBC AND DIFFERENTIAL
HEMATOCRIT: 33 — AB (ref 41–53)
Hemoglobin: 10.9 — AB (ref 13.5–17.5)
PLATELETS: 104 — AB (ref 150–399)
WBC: 5.5

## 2017-07-23 LAB — BASIC METABOLIC PANEL
BUN: 37 — AB (ref 4–21)
Creatinine: 1.8 — AB (ref 0.6–1.3)
GLUCOSE: 95
Potassium: 4.3 (ref 3.4–5.3)
Sodium: 140 (ref 137–147)

## 2017-07-24 ENCOUNTER — Non-Acute Institutional Stay (SKILLED_NURSING_FACILITY): Payer: Medicare Other | Admitting: Internal Medicine

## 2017-07-24 ENCOUNTER — Encounter: Payer: Self-pay | Admitting: Internal Medicine

## 2017-07-24 DIAGNOSIS — I951 Orthostatic hypotension: Secondary | ICD-10-CM

## 2017-07-24 DIAGNOSIS — K625 Hemorrhage of anus and rectum: Secondary | ICD-10-CM | POA: Diagnosis not present

## 2017-07-24 DIAGNOSIS — D62 Acute posthemorrhagic anemia: Secondary | ICD-10-CM

## 2017-07-24 LAB — BASIC METABOLIC PANEL
BUN: 30 — AB (ref 4–21)
CO2: 24
CREATININE: 1.9 — AB (ref <=1.3)
Calcium: 8.5
Chloride: 111
Glucose: 88
Potassium: 4.8 (ref 3.4–5.3)
Sodium: 138 (ref 137–147)

## 2017-07-24 LAB — CBC AND DIFFERENTIAL
HCT: 34 — AB (ref 41–53)
HEMOGLOBIN: 11.3 — AB (ref 13.5–17.5)
Platelets: 123 — AB (ref 150–399)
WBC: 6.3
WBC: 63

## 2017-07-24 NOTE — Progress Notes (Signed)
Location:  [facility] Room Number: 72 Place of Service:  SNF [phone]) Provider:  [name] MD  [name], MD  Patient Care Team: [name], MD as PCP - General (Internal Medicine) Mast, [name], NP as Nurse Practitioner (Internal Medicine)  Extended Emergency Contact Information Primary Emergency Contact: [name] Address: [address]          [place], [place] [address] [name] Phone: [phone] Relation: Spouse Secondary Emergency Contact: [name],[name] Address: [place], [place] [address] Johnnette Litter of [place] Phone: [phone] Work Phone: [phone] Mobile Phone: [phone] Relation: Daughter   Goals of care: Advanced Directive information Advanced Directives 04/09/2017  Does Patient Have a Medical Advance Directive? Yes  Type of Advance Directive -  Does patient want to make changes to medical advance directive? -  Copy of [place] in Chart? -  Would patient like information on creating a medical advance directive? -     Chief Complaint  Patient presents with  . Acute Visit    follow up on rectal bleed    HPI:  Pt is a 84 y.o. Scott [name] seen today for follow up visit. [facility] bleed and orthostatic hypotension on 07/22/17. His stat labs that day showed Hb 12.1 and Plt 119 with elevated BUN and cr. He was started on iv fluids normal saline 1 liter at 100 cc/hr. Lab from yesterday has cbc of 10.9 with improved BUN and cr. He is seen in his room today with his wife today. Per nursing, had a bowel movement yesterday with some blood noted in stool. Per wife, this was minimal compared to his 2 episodes in the apartment.    Past Medical History:  Diagnosis Date  . Anxiety   . Bradycardia    a. BB d/c'd in 2016.  [name] CAD (coronary artery disease)    a. 1983 s/p CABG [facility] [place]);  b. 2007  NSTEMI->med Rx for distal LCX dzs; c. 09/2014 NSTEMI/Cath: LIMA->LAD & diag ok, native RCA dominant and patent.   VG->RCA 100, VG->LCX 100 -->Med Rx.  . CKD (chronic kidney disease), stage III ([place])   . Claudication ([place])    BLE  . Depression   . Diastolic dysfunction    a. 10/2014 Echo: F 60-65%, Gr1 DD, mild AS, mild AI, mild to mod MR, PASP 70mmHg.  [name] Dyspnea 2010   syndrome - extensive  . Erectile dysfunction   . GERD (gastroesophageal reflux disease)   . H/O hiatal hernia   . History of blood transfusion 2014  . Hx of adenomatous colonic polyps 2007  . Hyperlipidemia   . Hypertensive heart disease   . Inguinal hernia   . Iron deficiency anemia   . Shingles 1990's  . Sigmoid diverticulosis   . Upper GI bleeding 2014   Gastritis and possible duodenal ulcer seen on EGD, felt secondary to NSAIDs  . Urethral stricture 11/2008  . Vasovagal syncope 05/2006  . Weight loss    Past Surgical History:  Procedure Laterality Date  . CARDIAC CATHETERIZATION  2007   Left main 100%, RCA 70% ostial, all grafts were patent, EF 40-45 percent, distal circumflex subtotal after graft insertion, medical therapy   . CARDIAC CATHETERIZATION N/A 10/14/2014   Procedure: Left Heart Cath and Cors/Grafts Angiography;  Surgeon: Pearletha Forge  [name], MD;  Location: [place] CV LAB;  Service: Cardiovascular;  Laterality: N/A;  . CHOLECYSTECTOMY  1980's  . CORONARY ARTERY BYPASS GRAFT  1994    LIMA-LAD, SVG-D1, SVG-OM,  SVG-RCA; performed in [place], [place]  . ESOPHAGOGASTRODUODENOSCOPY N/A 03/25/2013   Procedure: ESOPHAGOGASTRODUODENOSCOPY (EGD);  Surgeon: Missy Sabins, MD;  gastritis, cannot rule out duodenal ulcer   . INGUINAL HERNIA REPAIR Left   . TRANSURETHRAL RESECTION OF PROSTATE      No Known Allergies  Outpatient Encounter Medications as of 07/24/2017  Medication Sig  . acetaminophen (TYLENOL) 325 MG tablet Take 2 tablets (650 mg total) by mouth every 4 (four) hours as needed for headache or  mild pain.  [name] albuterol (PROAIR HFA) 108 (90 Base) MCG/ACT inhaler Inhale 2 puffs into the lungs every 6 (six) hours as needed for wheezing or shortness of breath.  [name] amLODipine (NORVASC) 5 MG tablet Take 1 tablet (5 mg total) by mouth daily.  [name] BREO ELLIPTA 100-25 MCG/INH AEPB 1 PUFF BY MOUTH DAILY  . buPROPion (WELLBUTRIN XL) 150 MG 24 hr tablet Take 150 mg by mouth daily.   [name] desvenlafaxine (PRISTIQ) 50 MG 24 hr tablet Take 25 mg by mouth daily.   [name] FERROCITE 324 MG TABS tablet TAKE 1 TABLET BY MOUTH QOD  . isosorbide mononitrate (IMDUR) 30 MG 24 hr tablet Take 30 mg by mouth daily.  [name] LYRICA 50 MG capsule Take 50 mg by mouth daily as needed (pain).   . nitroGLYCERIN (NITROSTAT) 0.4 MG SL tablet Place 1 tablet (0.4 mg total) under the tongue every 5 (five) minutes x 3 doses as needed for chest pain.  . Omega-3 Fatty Acids (FISH OIL PO) Take 1 capsule by mouth daily.  [name] osimertinib mesylate (TAGRISSO) 80 MG tablet Take 1 tablet (80 mg total) by mouth daily.  . OXYGEN Inhale 2 L into the lungs continuous.  . vitamin B-12 (CYANOCOBALAMIN) 1000 MCG tablet Take 1,000 mcg by mouth every evening.    No facility-administered encounter medications on file as of 07/24/2017.     Review of Systems  Constitutional: Positive for fatigue. Negative for appetite change, chills and fever.  Respiratory: Negative for cough and shortness of breath.   Cardiovascular: Negative for chest pain and palpitations.  Gastrointestinal: Positive for blood in stool. Negative for abdominal pain, diarrhea, nausea, rectal pain and vomiting.  Genitourinary: Negative for dysuria and hematuria.  Neurological: Negative for dizziness and headaches.  Psychiatric/Behavioral: Positive for confusion.    Immunization History  Administered Date(s) Administered  . Influenza-Unspecified 02/17/2013, 05/20/2017  . Pneumococcal-Unspecified 05/20/2017  . Tdap 07/11/2012   Pertinent  Health Maintenance Due  Topic Date Due  . PNA vac  Low Risk Adult (2 of 2 - PCV13) 05/20/2018  . INFLUENZA VACCINE  Completed   Fall Risk  04/07/2017  Falls in the past year? No   Functional Status Survey:    Vitals:   07/24/17 1043  BP: 130/68  Pulse: 70  Temp: 98.8 F (37.1 C)  TempSrc: Oral  Weight: 146 lb 9.6 oz (66.5 kg)   Body mass index is 23.66 kg/m.   Wt Readings from Last 3 Encounters:  07/24/17 146 lb 9.6 oz (66.5 kg)  07/22/17 147 lb 6.4 oz (66.9 kg)  07/16/17 148 lb 3.2 oz (67.2 kg)   Physical Exam  Constitutional: He appears well-developed and well-nourished. No distress.  Frail, elderly Scott [name]  HENT:  Head: Normocephalic and atraumatic.  Mouth/Throat: Oropharynx is clear and moist. No oropharyngeal  exudate.  Eyes: Conjunctivae and EOM are normal. Pupils are equal, round, and reactive to light. Right eye exhibits no discharge. Left eye exhibits no discharge.  Pallor present  Neck: Neck supple.  Cardiovascular: Normal rate and regular rhythm.  Murmur heard. Pulmonary/Chest: Effort normal. He has no wheezes. He has no rales.  Poor air movement  Abdominal: Soft. Bowel sounds are normal. There is no tenderness. There is no rebound and no guarding.  Musculoskeletal: He exhibits no edema.  Able to move all 4 extremities, unsteady gait, uses cane  Lymphadenopathy:    He has no cervical adenopathy.  Neurological: He is alert.  Oriented only to person  Skin: Skin is warm and dry. No rash noted. He is not diaphoretic.  Psychiatric: He has a normal mood and affect.    Labs reviewed: Recent Labs    05/01/17 1246 05/23/17 0947 05/23/17 0948 06/20/17 1054 07/22/17 07/23/17  NA 140  --  140 140 139 140  K 4.7  --  5.0 5.4* 4.9 4.3  CL  --   --   --  107 106 111  CO2 24  --  24 26 26 24   GLUCOSE 90  --  81 104  --   --   BUN 34.9*  --  41.7* 35* 45* 37*  CREATININE 2.3*  --  2.5* 2.30* 2.3* 1.8*  CALCIUM 9.8  --  9.2 9.3 9.3 8.5  MG  --   --  3.2* 3.0*  --   --   PHOS  --  3.7  --  3.4  --   --     Recent Labs    05/01/17 1246 05/23/17 0948 06/20/17 1054 07/22/17  AST 13 15 20 18   ALT 7 16 24 15   ALKPHOS 75 67 58 52  BILITOT 0.42 0.41 0.4  --   PROT 7.2 6.9 6.7 5.7  ALBUMIN 3.7 3.6 3.5 3.6   Recent Labs    05/01/17 1246 05/23/17 0948 06/20/17 1054 07/22/17 07/23/17  WBC 8.8 8.4 5.7 7.1 5.5  NEUTROABS 6.0 6.2 3.7  --   --   HGB 14.0 13.5  --  12.1* 10.9*  HCT 43.9 41.9 42.9 37* 33*  MCV 88.2 87.4 90.7  --   --   PLT 193 122* 103* 119* 104*   Lab Results  Component Value Date   TSH 2.186 01/22/2016   No results found for: HGBA1C Lab Results  Component Value Date   CHOL 175 10/14/2014   HDL 39 (L) 10/14/2014   LDLCALC 114 (H) 10/14/2014   TRIG 108 10/14/2014   CHOLHDL 4.5 10/14/2014    Significant Diagnostic Results in last 30 days:  Dg Chest 1 View  Result Date: 06/26/2017 CLINICAL DATA:  Status post left thoracentesis. EXAM: CHEST 1 VIEW COMPARISON:  06/20/2017 FINDINGS: Cardiac shadow is stable. Large hiatal hernia is again seen. Postsurgical changes are noted. No pneumothorax is noted following left thoracentesis. Minimal residual fluid is noted. The right lung is clear. No bony abnormality is noted. IMPRESSION: No evidence of pneumothorax following thoracentesis. Electronically Signed   By: Inez Catalina M.D.   On: 06/26/2017 14:46   US Thoracentesis Asp Pleural Space W/img Guide  Result Date: 06/26/2017 INDICATION: Patient with history of lung cancer and recurrent malignant left pleural effusion. Request made for therapeutic left thoracentesis. EXAM: ULTRASOUND GUIDED THERAPEUTIC LEFT THORACENTESIS MEDICATIONS: None. COMPLICATIONS: None immediate. PROCEDURE: An ultrasound guided thoracentesis was thoroughly discussed with the patient and questions answered. The benefits, risks,  alternatives and complications were also discussed. The patient understands and wishes to proceed with the procedure. Written consent was obtained. Ultrasound was performed to localize  and mark an adequate pocket of fluid in the left chest. The area was then prepped and draped in the normal sterile fashion. 1% Lidocaine was used for local anesthesia. Under ultrasound guidance a 6 Fr Safe-T-Centesis catheter was introduced. Thoracentesis was performed. The catheter was removed and a dressing applied. FINDINGS: A total of approximately 1.5 liters of yellow fluid was removed. IMPRESSION: Successful ultrasound guided therapeutic left thoracentesis yielding 1.5 liters of pleural fluid. Postprocedure chest x-ray revealed no pneumothorax. Read by: Rowe Robert, PA-C Electronically Signed   By: Lucrezia Europe M.D.   On: 06/26/2017 14:45    Assessment/Plan  Acute blood loss anemia Has drop in hemoglobin from 13.5 to 10.9. Low but stable platelet. Hemodynamically stable. Has slight drop in BP with position change but improved from 2 days back. Stat cbc today shows Hb 11.3 and HCT 34.3.  Spoke with daughter over the phone. will d/c back to his apartment with clinic follow up with me next week.  Rectal bleed Has GI follow up on 08/04/17. Hold aspirin for now.   Orthostatic hypotension Improved. Encouraged to maintain hydration.    Family/ staff Communication: reviewed care plan with patient and his family. If patient has large amount of blood in stool, dizziness, worsening weakness, pain to abdomen, nausea or vomiting, to notify immediately.    Labs/tests ordered:  Cbc 07/29/17   [name], MD Internal Medicine [facility] [address] [place], [place] [address] Cell Phone (Monday-Friday 8 am - 5 pm): [phone] On Call: [phone] and follow prompts after 5 pm and on weekends Office Phone: [phone] Office Fax: [phone]

## 2017-07-25 ENCOUNTER — Other Ambulatory Visit: Payer: Self-pay | Admitting: *Deleted

## 2017-07-25 ENCOUNTER — Other Ambulatory Visit: Payer: Self-pay

## 2017-07-25 DIAGNOSIS — K625 Hemorrhage of anus and rectum: Secondary | ICD-10-CM

## 2017-07-25 MED FILL — TAGRISSO 80 MG TABLET: 80 | 30 days supply | Qty: 30 | Fill #0

## 2017-07-29 ENCOUNTER — Encounter: Payer: Self-pay | Admitting: Internal Medicine

## 2017-07-29 ENCOUNTER — Telehealth: Payer: Self-pay | Admitting: *Deleted

## 2017-07-29 ENCOUNTER — Non-Acute Institutional Stay: Payer: Medicare Other | Admitting: Internal Medicine

## 2017-07-29 ENCOUNTER — Other Ambulatory Visit: Payer: Self-pay | Admitting: Internal Medicine

## 2017-07-29 VITALS — BP 130/62 | HR 79 | Temp 97.5°F | Resp 16 | Ht 66.0 in | Wt 155.8 lb

## 2017-07-29 DIAGNOSIS — I25709 Atherosclerosis of coronary artery bypass graft(s), unspecified, with unspecified angina pectoris: Secondary | ICD-10-CM

## 2017-07-29 DIAGNOSIS — K625 Hemorrhage of anus and rectum: Secondary | ICD-10-CM

## 2017-07-29 DIAGNOSIS — D500 Iron deficiency anemia secondary to blood loss (chronic): Secondary | ICD-10-CM | POA: Diagnosis not present

## 2017-07-29 LAB — CBC
HEMATOCRIT: 35.4 % — AB (ref 38.5–50.0)
Hemoglobin: 12.1 g/dL — ABNORMAL LOW (ref 13.2–17.1)
MCH: 28.6 pg (ref 27.0–33.0)
MCHC: 34.2 g/dL (ref 32.0–36.0)
MCV: 83.7 fL (ref 80.0–100.0)
MPV: 9.8 fL (ref 7.5–12.5)
Platelets: 174 10*3/uL (ref 140–400)
RBC: 4.23 10*6/uL (ref 4.20–5.80)
RDW: 14.7 % (ref 11.0–15.0)
WBC: 8.9 10*3/uL (ref 3.8–10.8)

## 2017-07-29 MED ORDER — PANTOPRAZOLE SODIUM 40 MG PO TBEC: 40.0000 mg | DELAYED_RELEASE_TABLET | Freq: Every day | ORAL | 3 refills | Status: DC

## 2017-07-29 NOTE — Progress Notes (Signed)
[facility]  Provider: [name] MD   Location:  [place] of Service:  Clinic (12)  PCP: [name], MD Patient Care Team: [name], MD as PCP - General (Internal Medicine) Mast, [name], NP as Nurse Practitioner (Internal Medicine)  Extended Emergency Contact Information Primary Emergency Contact: [facility] Address: [address]          [place], [place] [address] Johnnette Litter of [place] Phone: [phone] Relation: Spouse Secondary Emergency Contact: Collison,[name] Address: [place], [place] [address] Johnnette Litter of [place] Phone: [phone] Work Phone: [phone] Mobile Phone: [phone] Relation: Daughter   Goals of Care: Advanced Directive information Advanced Directives 04/09/2017  Does Patient Have a Medical Advance Directive? Yes  Type of Advance Directive -  Does patient want to make changes to medical advance directive? -  Copy of [place] in Chart? -  Would patient like information on creating a medical advance directive? -      Chief Complaint  Patient presents with  . Acute Visit    f/u on rectal bleed  . Medication Refill    No refills needed at this time    HPI: Patient is a 84 y.o. male seen today for acute visit for follow up on rectal bleed. He denies any further bleeding in his stool. Has some black colored stool per wife. Last bowel movement was yesterday. He denies any abdominal pain, nausea or vomiting.  Past Medical History:  Diagnosis Date  . Anxiety   . Bradycardia    a. BB d/c'd in 2016.  [name] CAD (coronary artery disease)    a. 1983 s/p CABG [facility], [place]);  b. 2007 NSTEMI->med Rx for distal LCX dzs; c. 09/2014 NSTEMI/Cath: LIMA->LAD & diag ok, native RCA dominant and patent.   VG->RCA 100, VG->LCX 100 -->Med Rx.  . CKD (chronic kidney disease), stage III ([place])   . Claudication ([place])    BLE  .  Depression   . Diastolic dysfunction    a. 10/2014 Echo: F 60-65%, Gr1 DD, mild AS, mild AI, mild to mod MR, PASP 73mmHg.  [name] Dyspnea 2010   syndrome - extensive  . Erectile dysfunction   . GERD (gastroesophageal reflux disease)   . H/O hiatal hernia   . History of blood transfusion 2014  . Hx of adenomatous colonic polyps 2007  . Hyperlipidemia   . Hypertensive heart disease   . Inguinal hernia   . Iron deficiency anemia   . Shingles 1990's  . Sigmoid diverticulosis   . Upper GI bleeding 2014   Gastritis and possible duodenal ulcer seen on EGD, felt secondary to NSAIDs  . Urethral stricture 11/2008  . Vasovagal syncope 05/2006  . Weight loss    Past Surgical History:  Procedure Laterality Date  . CARDIAC CATHETERIZATION  2007   Left main 100%, RCA 70% ostial, all grafts were patent, EF 40-45 percent, distal circumflex subtotal after graft insertion, medical therapy   . CARDIAC CATHETERIZATION N/A 10/14/2014   Procedure: Left Heart Cath and Cors/Grafts Angiography;  Surgeon: Lorretta Harp, MD;  Location: [place] CV LAB;  Service: Cardiovascular;  Laterality: N/A;  . CHOLECYSTECTOMY  1980's  . CORONARY ARTERY BYPASS GRAFT  1994    LIMA-LAD, SVG-D1, SVG-OM,  SVG-RCA; performed in [place], [place]  . ESOPHAGOGASTRODUODENOSCOPY N/A 03/25/2013  Procedure: ESOPHAGOGASTRODUODENOSCOPY (EGD);  Surgeon: Missy Sabins, MD;  gastritis, cannot rule out duodenal ulcer   . INGUINAL HERNIA REPAIR Left   . TRANSURETHRAL RESECTION OF PROSTATE      reports that  has never smoked. he has never used smokeless tobacco. He reports that he drinks about 4.2 oz of alcohol per week. He reports that he does not use drugs. Social History   Socioeconomic History  . Marital status: Married    Spouse name: Not on file  . Number of children: Not on file  . Years of education: Not on file  . Highest education level: Not on file  Social Needs  . Financial resource strain: Not on file  . Food  insecurity - worry: Not on file  . Food insecurity - inability: Not on file  . Transportation needs - medical: Not on file  . Transportation needs - non-medical: Not on file  Occupational History  . Occupation: Retired  Tobacco Use  . Smoking status: Never Smoker  . Smokeless tobacco: Never Used  Substance and Sexual Activity  . Alcohol use: Yes    Alcohol/week: 4.2 oz    Types: 7 Glasses of wine per week    Comment: wine - daily - currently not drinking  . Drug use: No  . Sexual activity: No    Birth control/protection: Abstinence  Other Topics Concern  . Not on file  Social History Narrative   He lives in [facility], independent living, with his wife.     Family History  Problem Relation Age of Onset  . Hypertension Mother   . Heart disease Mother   . Heart disease Father   . Heart failure Brother   . Heart failure Sister   . Heart failure Brother   . Heart attack Neg Hx   . Stroke Neg Hx     Health Maintenance  Topic Date Due  . PNA vac Low Risk Adult (2 of 2 - PCV13) 05/20/2018  . TETANUS/TDAP  07/11/2022  . INFLUENZA VACCINE  Completed    No Known Allergies  Outpatient Encounter Medications as of 07/29/2017  Medication Sig  . acetaminophen (TYLENOL) 325 MG tablet Take 2 tablets (650 mg total) by mouth every 4 (four) hours as needed for headache or mild pain.  [name] albuterol (PROAIR HFA) 108 (90 Base) MCG/ACT inhaler Inhale 2 puffs into the lungs every 6 (six) hours as needed for wheezing or shortness of breath.  [name] amLODipine (NORVASC) 5 MG tablet Take 1 tablet (5 mg total) by mouth daily.  [name] BREO ELLIPTA 100-25 MCG/INH AEPB 1 PUFF BY MOUTH DAILY  . buPROPion (WELLBUTRIN XL) 150 MG 24 hr tablet Take 150 mg by mouth daily.   [name] desvenlafaxine (PRISTIQ) 50 MG 24 hr tablet Take 25 mg by mouth daily.   [name] FERROCITE 324 MG TABS tablet TAKE 1 TABLET BY MOUTH QOD  . isosorbide mononitrate (IMDUR) 30 MG 24 hr tablet Take 30 mg by mouth daily.  [name] LYRICA 50 MG capsule  Take 50 mg by mouth daily as needed (pain).   . nitroGLYCERIN (NITROSTAT) 0.4 MG SL tablet Place 1 tablet (0.4 mg total) under the tongue every 5 (five) minutes x 3 doses as needed for chest pain.  . Omega-3 Fatty Acids (FISH OIL PO) Take 1 capsule by mouth daily.  [name] TAGRISSO 80 MG tablet TAKE 1 TABLET (80 MG TOTAL) BY MOUTH DAILY.  . vitamin B-12 (CYANOCOBALAMIN) 1000 MCG tablet Take 1,000 mcg by mouth every  evening.   . OXYGEN Inhale 2 L into the lungs continuous.   No facility-administered encounter medications on file as of 07/29/2017.     Review of Systems  Constitutional: Positive for appetite change. Negative for chills, diaphoresis and fever.  Respiratory: Positive for shortness of breath.        Has dyspnea with exertion  Cardiovascular: Negative for chest pain and palpitations.  Gastrointestinal: Negative for abdominal pain, constipation, diarrhea, nausea and vomiting.  Genitourinary: Negative for hematuria.  Musculoskeletal: Positive for gait problem.  Neurological: Positive for dizziness. Negative for numbness and headaches.  Psychiatric/Behavioral: Positive for confusion.    Vitals:   07/29/17 0853  BP: 130/62  Pulse: 79  Resp: 16  Temp: (!) 97.5 F (36.4 C)  TempSrc: Oral  SpO2: 95%  Weight: 155 lb 12.8 oz (70.7 kg)  Height: 5\' 6"  (1.676 m)   Body mass index is 25.15 kg/m.    BP standing 132/70 HR 85/min  Physical Exam  Constitutional: He appears well-developed. No distress.  frail  HENT:  Head: Normocephalic and atraumatic.  Eyes: Conjunctivae and EOM are normal. Pupils are equal, round, and reactive to light. Right eye exhibits no discharge. Left eye exhibits no discharge.  Neck: Neck supple.  Cardiovascular: Normal rate and regular rhythm.  Murmur heard. Pulmonary/Chest: Effort normal and breath sounds normal. He has no wheezes. He has no rales.  Abdominal: Soft. Bowel sounds are normal. There is no tenderness. There is no rebound.  Lymphadenopathy:     He has no cervical adenopathy.  Skin: He is not diaphoretic.    Labs reviewed: Basic Metabolic Panel: Recent Labs    05/01/17 1246 05/23/17 0947 05/23/17 0948 06/20/17 1054 07/22/17 07/23/17 07/24/17  NA 140  --  140 140 139 140 138  K 4.7  --  5.0 5.4* 4.9 4.3 4.8  CL  --   --   --  107 106 111  --   CO2 24  --  24 26 26 24   --   GLUCOSE 90  --  81 104  --   --   --   BUN 34.9*  --  41.7* 35* 45* 37* 30*  CREATININE 2.3*  --  2.5* 2.30* 2.3* 1.8* 1.9*  CALCIUM 9.8  --  9.2 9.3 9.3 8.5  --   MG  --   --  3.2* 3.0*  --   --   --   PHOS  --  3.7  --  3.4  --   --   --    Liver Function Tests: Recent Labs    05/01/17 1246 05/23/17 0948 06/20/17 1054 07/22/17  AST 13 15 20 18   ALT 7 16 24 15   ALKPHOS 75 67 58 52  BILITOT 0.42 0.41 0.4  --   PROT 7.2 6.9 6.7 5.7  ALBUMIN 3.7 3.6 3.5 3.6   Recent Labs    04/09/17 1352  LIPASE 48   No results for input(s): AMMONIA in the last 8760 hours. CBC: Recent Labs    05/01/17 1246 05/23/17 0948 06/20/17 1054 07/22/17 07/23/17 07/24/17  WBC 8.8 8.4 5.7 7.1 5.5 63.0  6.3  NEUTROABS 6.0 6.2 3.7  --   --   --   HGB 14.0 13.5  --  12.1* 10.9* 11.3*  HCT 43.9 41.9 42.9 37* 33* 34*  MCV 88.2 87.4 90.7  --   --   --   PLT 193 122* 103* 119* 104* 123*   Cardiac Enzymes: No results  for input(s): CKTOTAL, CKMB, CKMBINDEX, TROPONINI in the last 8760 hours. BNP: Invalid input(s): POCBNP No results found for: HGBA1C Lab Results  Component Value Date   TSH 2.186 01/22/2016   Lab Results  Component Value Date   OEHOZYYQ82 500 01/23/2016   No results found for: FOLATE No results found for: IRON, TIBC, FERRITIN  Lipid Panel: No results for input(s): CHOL, HDL, LDLCALC, TRIG, CHOLHDL, LDLDIRECT in the last 8760 hours. No results found for: HGBA1C  Procedures since last visit: No results found.  Assessment/Plan  1. Blood loss anemia Pending cbc from this am. On iron supplement, continue this.   2. Rectal bleed With  black colored stool and recent rectal bleed, start PPI. Pending cbc from this am. Pending GI follow up. orhtostatic negative this visit.  - pantoprazole (PROTONIX) 40 MG tablet; Take 1 tablet (40 mg total) by mouth daily.  Dispense: 30 tablet; Refill: 3    Labs/tests ordered:  Pending cbc  Next appointment: has f/u with [facility] NP  Communication: reviewed care plan with patient and his wife.     [name], MD Internal Medicine [facility] [address] [place], [place] [address] Cell Phone (Monday-Friday 8 am - 5 pm): [phone] On Call: [phone] and follow prompts after 5 pm and on weekends Office Phone: [phone] Office Fax: [phone]

## 2017-07-29 NOTE — Patient Instructions (Addendum)
Please take pantoprazole 40 mg daily empty stomach every morning.    If you notice any blood in your stool, notify us.  Keep appointment with GI doctor on 08/04/17.

## 2017-07-29 NOTE — Telephone Encounter (Signed)
Patient wife called and stated that she went up to the pharmacy to pick up the Rx Pantoprazole that Dr. Bubba Camp sent to pharmacy today and they never received it. Refaxed Rx to pharmacy.

## 2017-07-30 DIAGNOSIS — R2681 Unsteadiness on feet: Secondary | ICD-10-CM | POA: Diagnosis not present

## 2017-07-30 DIAGNOSIS — D509 Iron deficiency anemia, unspecified: Secondary | ICD-10-CM | POA: Diagnosis not present

## 2017-07-30 DIAGNOSIS — R2689 Other abnormalities of gait and mobility: Secondary | ICD-10-CM | POA: Diagnosis not present

## 2017-07-30 DIAGNOSIS — Z9181 History of falling: Secondary | ICD-10-CM | POA: Diagnosis not present

## 2017-07-30 DIAGNOSIS — M6281 Muscle weakness (generalized): Secondary | ICD-10-CM | POA: Diagnosis not present

## 2017-07-30 DIAGNOSIS — F418 Other specified anxiety disorders: Secondary | ICD-10-CM | POA: Diagnosis not present

## 2017-07-31 ENCOUNTER — Telehealth: Payer: Self-pay

## 2017-07-31 ENCOUNTER — Other Ambulatory Visit: Payer: Medicare Other

## 2017-07-31 ENCOUNTER — Ambulatory Visit: Payer: Medicare Other | Admitting: Hematology

## 2017-07-31 NOTE — Telephone Encounter (Signed)
Noticed this morning that based on LOS from last OV, physician had requested for pt to have CT chest at five weeks time. OV scheduled today  and no CT completed. Spoke with Manuela Schwartz in central Scheduling and she noted that the pt was called and a VM was left, but the pt had not gotten back in touch with CS to create an appt for the CT. Openings available this afternoon or tomorrow if needed.   Called pt requested number and left VM for pt to return call to [phone] at earliest convenience to determine when to have CT scheduled and f/u with Dr. Irene Limbo.

## 2017-07-31 NOTE — Telephone Encounter (Signed)
Received phone call from pt wife, Scott [name], and she plans to call Central Scheduling immediately and schedule Chest CT. She will call back after scheduling so that we can determine when to f/u with the pt. Dr, Irene Limbo okay to f/u this afternoon if able or tomorrow and f/u later.

## 2017-07-31 NOTE — Telephone Encounter (Signed)
Called and spoke with pt wife regarding appt changes tomorrow. Already aware of Chest CT at 1030. Lab scheduled for 1145 and doctor vist at 1210. Pt wife and daughter verbalized understanding. Plan to see pt tomorrow.

## 2017-08-01 ENCOUNTER — Inpatient Hospital Stay: Payer: Medicare Other | Attending: Hematology | Admitting: Hematology

## 2017-08-01 ENCOUNTER — Ambulatory Visit (HOSPITAL_COMMUNITY)
Admission: RE | Admit: 2017-08-01 | Discharge: 2017-08-01 | Disposition: A | Discharge status: Home / Self Care | Payer: Medicare Other | Source: Ambulatory Visit | Attending: Hematology | Admitting: Hematology

## 2017-08-01 ENCOUNTER — Telehealth: Payer: Self-pay | Admitting: Hematology

## 2017-08-01 ENCOUNTER — Inpatient Hospital Stay: Payer: Medicare Other

## 2017-08-01 ENCOUNTER — Encounter: Payer: Self-pay | Admitting: Hematology

## 2017-08-01 VITALS — BP 152/69 | HR 97 | Temp 97.9°F | Resp 18 | Ht 66.0 in | Wt 150.7 lb

## 2017-08-01 DIAGNOSIS — C3490 Malignant neoplasm of unspecified part of unspecified bronchus or lung: Secondary | ICD-10-CM | POA: Diagnosis not present

## 2017-08-01 DIAGNOSIS — K449 Diaphragmatic hernia without obstruction or gangrene: Secondary | ICD-10-CM | POA: Diagnosis not present

## 2017-08-01 DIAGNOSIS — J9811 Atelectasis: Secondary | ICD-10-CM | POA: Diagnosis not present

## 2017-08-01 DIAGNOSIS — J910 Malignant pleural effusion: Secondary | ICD-10-CM | POA: Diagnosis not present

## 2017-08-01 DIAGNOSIS — I252 Old myocardial infarction: Secondary | ICD-10-CM | POA: Diagnosis not present

## 2017-08-01 DIAGNOSIS — Z79899 Other long term (current) drug therapy: Secondary | ICD-10-CM | POA: Insufficient documentation

## 2017-08-01 DIAGNOSIS — E041 Nontoxic single thyroid nodule: Secondary | ICD-10-CM | POA: Diagnosis not present

## 2017-08-01 DIAGNOSIS — Z951 Presence of aortocoronary bypass graft: Secondary | ICD-10-CM | POA: Insufficient documentation

## 2017-08-01 DIAGNOSIS — C3412 Malignant neoplasm of upper lobe, left bronchus or lung: Secondary | ICD-10-CM

## 2017-08-01 DIAGNOSIS — Z9981 Dependence on supplemental oxygen: Secondary | ICD-10-CM | POA: Insufficient documentation

## 2017-08-01 DIAGNOSIS — F418 Other specified anxiety disorders: Secondary | ICD-10-CM | POA: Insufficient documentation

## 2017-08-01 DIAGNOSIS — I5032 Chronic diastolic (congestive) heart failure: Secondary | ICD-10-CM | POA: Insufficient documentation

## 2017-08-01 DIAGNOSIS — R5383 Other fatigue: Secondary | ICD-10-CM

## 2017-08-01 DIAGNOSIS — I2510 Atherosclerotic heart disease of native coronary artery without angina pectoris: Secondary | ICD-10-CM | POA: Diagnosis not present

## 2017-08-01 DIAGNOSIS — E785 Hyperlipidemia, unspecified: Secondary | ICD-10-CM | POA: Insufficient documentation

## 2017-08-01 DIAGNOSIS — K219 Gastro-esophageal reflux disease without esophagitis: Secondary | ICD-10-CM | POA: Insufficient documentation

## 2017-08-01 DIAGNOSIS — I130 Hypertensive heart and chronic kidney disease with heart failure and stage 1 through stage 4 chronic kidney disease, or unspecified chronic kidney disease: Secondary | ICD-10-CM | POA: Insufficient documentation

## 2017-08-01 DIAGNOSIS — R630 Anorexia: Secondary | ICD-10-CM | POA: Diagnosis not present

## 2017-08-01 DIAGNOSIS — I700 Atherosclerosis of aorta: Secondary | ICD-10-CM | POA: Diagnosis not present

## 2017-08-01 DIAGNOSIS — N183 Chronic kidney disease, stage 3 (moderate): Secondary | ICD-10-CM | POA: Insufficient documentation

## 2017-08-01 LAB — CBC WITH DIFFERENTIAL (CANCER CENTER ONLY)
BASOS ABS: 0 10*3/uL (ref 0.0–0.1)
BASOS PCT: 0 %
EOS ABS: 0.1 10*3/uL (ref 0.0–0.5)
EOS PCT: 2 %
HCT: 34.9 % — ABNORMAL LOW (ref 38.4–49.9)
Hemoglobin: 11 g/dL — ABNORMAL LOW (ref 13.0–17.1)
Lymphocytes Relative: 18 %
Lymphs Abs: 1 10*3/uL (ref 0.9–3.3)
MCH: 28.1 pg (ref 27.2–33.4)
MCHC: 31.5 g/dL — ABNORMAL LOW (ref 32.0–36.0)
MCV: 89 fL (ref 79.3–98.0)
MONO ABS: 0.7 10*3/uL (ref 0.1–0.9)
Monocytes Relative: 14 %
NEUTROS ABS: 3.6 10*3/uL (ref 1.5–6.5)
Neutrophils Relative %: 66 %
PLATELETS: 138 10*3/uL — AB (ref 140–400)
RBC: 3.92 MIL/uL — ABNORMAL LOW (ref 4.20–5.82)
RDW: 15.2 % — AB (ref 11.0–14.6)
WBC Count: 5.4 10*3/uL (ref 4.0–10.3)

## 2017-08-01 LAB — CMP (CANCER CENTER ONLY)
ALT: 13 U/L (ref 0–55)
AST: 17 U/L (ref 5–34)
Albumin: 3 g/dL — ABNORMAL LOW (ref 3.5–5.0)
Alkaline Phosphatase: 64 U/L (ref 40–150)
Anion gap: 8 (ref 3–11)
BILIRUBIN TOTAL: 0.3 mg/dL (ref 0.2–1.2)
BUN: 26 mg/dL (ref 7–26)
CO2: 25 mmol/L (ref 22–29)
Calcium: 9.3 mg/dL (ref 8.4–10.4)
Chloride: 109 mmol/L (ref 98–109)
Creatinine: 1.69 mg/dL — ABNORMAL HIGH (ref 0.70–1.30)
GFR, EST NON AFRICAN AMERICAN: 34 mL/min — AB (ref >60)
GFR, Est AFR Am: 39 mL/min — ABNORMAL LOW (ref >60)
Glucose, Bld: 65 mg/dL — ABNORMAL LOW (ref 70–140)
POTASSIUM: 4.5 mmol/L (ref 3.5–5.1)
Sodium: 142 mmol/L (ref 136–145)
TOTAL PROTEIN: 6.4 g/dL (ref 6.4–8.3)

## 2017-08-01 LAB — RETICULOCYTES
RBC.: 3.92 MIL/uL — AB (ref 4.20–5.82)
RETIC COUNT ABSOLUTE: 74.5 10*3/uL (ref 34.8–93.9)
RETIC CT PCT: 1.9 % — AB (ref 0.8–1.8)

## 2017-08-01 LAB — MAGNESIUM: MAGNESIUM: 2.4 mg/dL (ref 1.5–2.5)

## 2017-08-01 MED ORDER — DEXAMETHASONE 2 MG PO TABS: 2.0000 mg | ORAL_TABLET | Freq: Every day | ORAL | 0 refills | Status: DC

## 2017-08-01 NOTE — Progress Notes (Signed)
HEMATOLOGY/ONCOLOGY CLINIC NOTE  Date of Service: 08/01/17  Patient Care Team: [name], MD as PCP - General (Internal Medicine) Mast, [name], NP as Nurse Practitioner (Internal Medicine)  CHIEF COMPLAINTS/PURPOSE OF CONSULTATION:   F/u fr EGFR mutated metastatic lung adenocarcinoma  HISTORY OF PRESENTING ILLNESS:   Scott [name] is a wonderful 84 y.o. male who has been referred to [place] by Dr. [name], MD for evaluation of suspected lung cancer.   He has a hx of CAD, CKD, Diastolic CHF, HTN, HLD. He presented to the ED on 03/03/2017 for SOB and was found to have pleural effusion. He was also to have a mass in the upper lobe of left lung after a CT chest Wo contrast with results showing:IMPRESSION:1. 2.4 x 3.6 cm apparent mass in the posterior left upper lobe with associated left upper lobe interlobular septal thickening. Imaging features concerning for neoplasm with lymphangitic tumor spread. 2. Small left pleural effusion. 3. Large hiatal hernia contains nearly the entire stomach with evidence of organoaxial volvulus. No evidence for gastric obstruction. 4. 19 mm right adrenal adenoma.  He is accompanied by his wife and daughter. He states that he began feeling SOB 2 weeks ago. He is not a smoker and has not smoked in the past. He states that he has not been evaluated by his cardiologist, Dr. Larae Grooms. For his recent SOB.  On review of systems, patient reports fatigue, intermittent SOB, and denies fever, chills, CP, cough, weight loss, and any other accompanying symptoms.  INTERVAL HISTORY:   Patient is here for f/u of his recently diagnosed lung adenocarcinoma.The patient's last visit with [place] was on 06/20/17. He is accompanied today by his wife and daughter. He reports that he is not doing well overall. He reports that he is not able to complete his daily activities due to increased SOB and fatigue. He notes that he has to stop and takes breaks while walking due  to the DOE.  Decreased appetite and notes some weight loss , but per chart weight stable at 150lbs over last 6 weeks.  Since his last visit to the office, he had a US guided thoracentesis completed on 06/26/17. He had a CXR completed s/p thoracentesis on 06/26/17 with results showing: No evidence of pneumothorax following thoracentesis.  He also had a CT Chest without contrast today, 08/01/2017 that shows Increase in volume of left pleural effusion. 2. Central perihilar area of masslike architectural distortion is nonspecific but results and postobstructive, subsegmental atelectasis of the basilar portion of the left upper lobe with associated volume loss  Discussed pt labwork today (08/01/17) of CBC, CMP, Magnesium, and Reticulocytes is as follows: all values are WNL except for Reticulocytes with retic ct pct at 1.9 and RBC at 3.92. CMP with Glucose at 65, Cr at 1.69, Albumin at 3, GFR, est non Af am at 34, GFR, est afr am at 39. CBC with Hgb at 11, HCT at 34.9, MCHC at 31.5, RDW at 15.2, PLT at 138k.   On review of systems, he reports decreased appetite due to lack of desire to consume more than the bare minimum, resolved blood in stool x 2 episodes, GERD. He is not on blood thinners at this time. The patient was evaluated by Dr. Bubba Camp and [facility] for [facility]. Daughter reports that the patient has an appointment with a GI specialist next week. He has been taking a Rx medication for his GERD. He  denies rash, HA, CP, and any other symptoms.    MEDICAL HISTORY:  Past Medical History:  Diagnosis Date  . Anxiety   . Bradycardia    a. BB d/c'd in 2016.  [name] CAD (coronary artery disease)    a. 1983 s/p CABG [facility] [place]);  b. 2007 NSTEMI->med Rx for distal LCX dzs; c. 09/2014 NSTEMI/Cath: LIMA->LAD & diag ok, native RCA dominant and patent.   VG->RCA 100, VG->LCX 100 -->Med Rx.  . CKD (chronic kidney disease), stage III ([place])   . Claudication ([place])     BLE  . Depression   . Diastolic dysfunction    a. 10/2014 Echo: F 60-65%, Gr1 DD, mild AS, mild AI, mild to mod MR, PASP 76mHg.  .Marland KitchenDyspnea 2010   syndrome - extensive  . Erectile dysfunction   . GERD (gastroesophageal reflux disease)   . H/O hiatal hernia   . History of blood transfusion 2014  . Hx of adenomatous colonic polyps 2007  . Hyperlipidemia   . Hypertensive heart disease   . Inguinal hernia   . Iron deficiency anemia   . Shingles 1990's  . Sigmoid diverticulosis   . Upper GI bleeding 2014   Gastritis and possible duodenal ulcer seen on EGD, felt secondary to NSAIDs  . Urethral stricture 11/2008  . Vasovagal syncope 05/2006  . Weight loss     SURGICAL HISTORY: Past Surgical History:  Procedure Laterality Date  . CARDIAC CATHETERIZATION  2007   Left main 100%, RCA 70% ostial, all grafts were patent, EF 40-45 percent, distal circumflex subtotal after graft insertion, medical therapy   . CARDIAC CATHETERIZATION N/A 10/14/2014   Procedure: Left Heart Cath and Cors/Grafts Angiography;  Surgeon: JLorretta Harp MD;  [facility];  Service: Cardiovascular;  Laterality: N/A;  . CHOLECYSTECTOMY  1980's  . CORONARY ARTERY BYPASS GRAFT  1994    LIMA-LAD, SVG-D1, SVG-OM,  SV[place]RCA[place] ESOPHAGOGASTRODUODENOSCOPY N/A 03/25/2013   Procedure: ESOPHAGOGASTRODUODENOSCOPY (EGD);  Surgeon: JMissy Sabins MD;  gastritis, cannot rule out duodenal ulcer   . INGUINAL HERNIA REPAIR Left   . TRANSURETHRAL RESECTION OF PROSTATE      SOCIAL HISTORY: Social History   Socioeconomic History  . Marital status: Married    Spouse name: Not on file  . Number of children: Not on file  . Years of education: Not on file  . Highest education level: Not on file  Social Needs  . Financial resource strain: Not on file  . Food insecurity - worry: Not on file  . Food insecurity - inability: Not on file  . Transportation needs - medical: Not on file    . Transportation needs - non-medical: Not on file  Occupational History  . Occupation: Retired  Tobacco Use  . Smoking status: Never Smoker  . Smokeless tobacco: Never Used  Substance and Sexual Activity  . Alcohol use: Yes    Alcohol/week: 4.2 oz    Types: 7 Glasses of wine per week    Comment: wine - daily - currently not drinking  . Drug use: No  . Sexual activity: No    Birth control/protection: Abstinence  Other Topics Concern  . Not on file  Social History Narrative   [facility].    FAMILY HISTORY: Family History  Problem Relation Age of Onset  . Hypertension Mother   . Heart disease Mother   . Heart disease Father   .  Heart failure Brother   . Heart failure Sister   . Heart failure Brother   . Heart attack Neg Hx   . Stroke Neg Hx     ALLERGIES:  has No Known Allergies.  MEDICATIONS:  Current Outpatient Medications  Medication Sig Dispense Refill  . acetaminophen (TYLENOL) 325 MG tablet Take 2 tablets (650 mg total) by mouth every 4 (four) hours as needed for headache or mild pain.    [name] albuterol (PROAIR HFA) 108 (90 Base) MCG/ACT inhaler Inhale 2 puffs into the lungs every 6 (six) hours as needed for wheezing or shortness of breath.    [name] amLODipine (NORVASC) 5 MG tablet Take 1 tablet (5 mg total) by mouth daily. 90 tablet 0  . BREO ELLIPTA 100-25 MCG/INH AEPB 1 PUFF BY MOUTH DAILY  11  . buPROPion (WELLBUTRIN XL) 150 MG 24 hr tablet Take 150 mg by mouth daily.     [name] desvenlafaxine (PRISTIQ) 50 MG 24 hr tablet Take 25 mg by mouth daily.     [name] FERROCITE 324 MG TABS tablet TAKE 1 TABLET BY MOUTH QOD  11  . isosorbide mononitrate (IMDUR) 30 MG 24 hr tablet Take 30 mg by mouth daily.    [name] LYRICA 50 MG capsule Take 50 mg by mouth daily as needed (pain).     . nitroGLYCERIN (NITROSTAT) 0.4 MG SL tablet Place 1 tablet (0.4 mg total) under the tongue every 5 (five) minutes x 3 doses as needed for chest pain. 25 tablet 4   . Omega-3 Fatty Acids (FISH OIL PO) Take 1 capsule by mouth daily.    . OXYGEN Inhale 2 L into the lungs continuous.    . pantoprazole (PROTONIX) 40 MG tablet Take 1 tablet (40 mg total) by mouth daily. 30 tablet 3  . TAGRISSO 80 MG tablet TAKE 1 TABLET (80 MG TOTAL) BY MOUTH DAILY. 30 tablet 2  . vitamin B-12 (CYANOCOBALAMIN) 1000 MCG tablet Take 1,000 mcg by mouth every evening.     [name] dexamethasone (DECADRON) 2 MG tablet Take 1 tablet (2 mg total) by mouth daily with breakfast. 30 tablet 0   No current facility-administered medications for this visit.     REVIEW OF SYSTEMS:    .10 Point review of Systems was done is negative except as noted above.  PHYSICAL EXAMINATION:  ECOG PERFORMANCE STATUS: 2 - Symptomatic, <50% confined to bed  .BP (!) 152/69 (BP Location: Left Arm, Patient Position: Sitting) Comment: Notified Nurse of BP  Pulse 97   Temp 97.9 F (36.6 C) (Oral)   Resp 18   Ht _0  (1.676 m)   Wt 150 lb 11.2 oz (68.4 kg)   SpO2 97%   BMI 24.32 kg/m   . GENERAL:alert,somewhat fatigued appearing elderly gentleman SKIN: no acute rashes, no significant lesions EYES: conjunctiva are pink and non-injected, sclera anicteric OROPHARYNX: MMM, no exudates, no oropharyngeal erythema or ulceration NECK: supple, no JVD LYMPH:  no palpable lymphadenopathy in the cervical, axillary or inguinal regions LUNGS: decreased air entry left lung upto about 1/2 up left chest wall. No rales no rhonci HEART: regular rate & rhythm ABDOMEN:  normoactive bowel sounds , non tender, not distended. Extremity:trace pedal edema PSYCH: alert & oriented x 3 with fluent speech NEURO: no focal motor/sensory deficits    LABORATORY DATA:  I have reviewed the data as listed  . CBC Latest Ref Rng & Units 08/01/2017 07/29/2017 07/24/2017  WBC 4.0 - 10.3 K/uL 5.4 8.9 63.0  Hemoglobin 13.2 -  17.1 g/dL - 12.1(L) -  Hematocrit 38.4 - 49.9 % 34.9(L) 35.4(L) -  Platelets 140 - 400 K/uL 138(L) 174 -  HGB  11  . CMP Latest Ref Rng & Units 08/01/2017 07/24/2017 07/23/2017  Glucose 70 - 140 mg/dL 65(L) - -  BUN 7 - 26 mg/dL 26 30(A) 37(A)  Creatinine 0.70 - 1.30 mg/dL 1.69(H) 1.9(A) 1.8(A)  Sodium 136 - 145 mmol/L 142 138 140  Potassium 3.5 - 5.1 mmol/L 4.5 4.8 4.3  Chloride 98 - 109 mmol/L 109 - 111  CO2 22 - 29 mmol/L 25 - 24  Calcium 8.4 - 10.4 mg/dL 9.3 - 8.5  Total Protein 6.4 - 8.3 g/dL 6.4 - -  Total Bilirubin 0.2 - 1.2 mg/dL 0.3 - -  Alkaline Phos 40 - 150 U/L 64 - -  AST 5 - 34 U/L 17 - -  ALT 0 - 55 U/L 13 - -     RADIOGRAPHIC STUDIES: I have personally reviewed the radiological images as listed and agreed with the findings in the report. Ct Chest Wo Contrast  Result Date: 08/01/2017 CLINICAL DATA:  Follow-up lung cancer. EXAM: CT CHEST WITHOUT CONTRAST TECHNIQUE: Multidetector CT imaging of the chest was performed following the standard protocol without IV contrast. COMPARISON:  06/26/2017 FINDINGS: Cardiovascular: Normal heart size. No pericardial effusion. Previous median sternotomy and CABG procedure. Aortic atherosclerosis noted. Mediastinum/Nodes: Low-density nodule within the inferior pole of right lobe of thyroid gland measures 1.5 cm. The trachea appears patent and is midline. Large hiatal hernia. No enlarged supraclavicular or axillary adenopathy. No enlarged mediastinal lymph nodes. Calcified left hilar nodes identified. Lungs/Pleura: Increase in volume of left pleural effusion. There is progressive volume loss within the left upper lobe. Central perihilar left upper lobe mass is identified with postobstructive atelectasis involving the basilar portion of the left upper lobe. This measures approximately 2.4 x 4.5 cm, image 75/series 2. Upper Abdomen: Low-attenuation nodule in right adrenal gland is identified measuring 1.7 cm, image 133/series 2. Unchanged from previous exam. Musculoskeletal: No aggressive lytic or sclerotic bone lesions identified. IMPRESSION: 1. Increase in  volume of left pleural effusion. 2. Central perihilar area of masslike architectural distortion is nonspecific but results and postobstructive, subsegmental atelectasis of the basilar portion of the left upper lobe with associated volume loss. Although findings may reflect post treatment changes underlying tumor cannot be excluded. Consider follow-up imaging with PET-CT. 3. Right lobe of thyroid gland nodule. Consider further evaluation with thyroid ultrasound. If patient is clinically hyperthyroid, consider nuclear medicine thyroid uptake and scan. 4.  Aortic Atherosclerosis (ICD10-I70.0). 5. Hiatal hernia. Electronically Signed   By: Kerby Moors M.D.   On: 08/01/2017 14:22    ASSESSMENT & PLAN:   Scott [name] is a wonderful 84 y.o. male with left upper lobe mass.  1)Recently diagnosed Metastatic Left lung Adenocarcinoma involving LUL lung mass left lung with associated moderated malignant left pleural effusion and left hilar LNadenopathy. EGFR mutated lung adenocarcinoma  2) DOE slightly increased.  Ct chest --08/01/2017-- increasing left pleural effusion. No other overt evidence of progression of metastatic lung cancer.  3) Grade 1-2 fatigue -- multifactorial Age+ significant burden of medical issues + SOE/DOE from pleural effusion + fatigue from medication. No fevers/chills PLAN  -Discussed pt labwork today (08/01/17) of CBC, CMP, Magnesium, and Reticulocytes is as follows: all values are WNL except for Reticulocytes with retic ct pct at 1.9 and RBC at 3.92. CMP with Glucose at 65, Cr at 1.69, Albumin at 3, GFR,  est non Af am at 34, GFR, est afr am at 39. CBC with Hgb at 11, HCT at 34.9, MCHC at 31.5, RDW at 15.2, PLT at 138k.  -CT chest prelim reviewed. Called and informed wife about final CT chest results. -US guided left sided therapeutic thoracentesis for comfort from symptomatic pleural effusion - on Monday 08/04/2017 - tolerating Tagrisso without any prohibitive toxicities except  grade 1-2 fatigue. -continue Tagrisso at current dose. -Thyroid profile with next labs in 2 weeks -started on Dexamethasone 22m po daily for anorexia related to cancer/meds. -Encouraged pt to continue with PT at his facility  3) Grade 1-2 Fatigue -Will check a TSH level  -optimize nutrition -maintain active lifestyle.  4) Decreased appetite -Advised with the patient, his wife, and his daughter that the patient could use boost or ensure as a nutritional supplement.  -Will prescribe a low-dose dexamethasone for 1-2 weeks to jumpstart appetite.   Plan: -UKoreaguided thoracentesis on Monday 08/04/2017 -RTC with Dr KIrene Limboin 2 weeks with labs   All of the patients questions were answered with apparent satisfaction. The patient knows to call the clinic with any [id number] questions or concerns.  . The total time spent in the appointment was 25 minutes and more than 50% was on counseling and direct patient cares.    GaSullivan LoneD MSAmboyAHIVMS [facility]CTex[facility] Pembrokeematology/Oncology Physician CoGuthrie County Hospital(Office):       [phone] cell):  [phone]):           [phone] document serves as a record of services personally performed by GaSullivan LoneMD. It was created on his behalf by SoSteva Coldera trained medical scribe. The creation of this record is based on the scribe's personal observations and the provider's statements to them.  .I have reviewed the above documentation for accuracy and completeness, and I agree with the above. .GBrunetta GeneraD MS

## 2017-08-01 NOTE — Telephone Encounter (Signed)
Gave avs and calendar ° °

## 2017-08-04 ENCOUNTER — Encounter: Payer: Self-pay | Admitting: Physician Assistant

## 2017-08-04 ENCOUNTER — Ambulatory Visit (INDEPENDENT_AMBULATORY_CARE_PROVIDER_SITE_OTHER): Payer: Medicare Other | Admitting: Physician Assistant

## 2017-08-04 VITALS — BP 134/78 | HR 80 | Ht 66.0 in | Wt 150.5 lb

## 2017-08-04 DIAGNOSIS — K921 Melena: Secondary | ICD-10-CM

## 2017-08-04 DIAGNOSIS — K648 Other hemorrhoids: Secondary | ICD-10-CM | POA: Diagnosis not present

## 2017-08-04 DIAGNOSIS — K219 Gastro-esophageal reflux disease without esophagitis: Secondary | ICD-10-CM | POA: Diagnosis not present

## 2017-08-04 MED ORDER — HYDROCORTISONE ACETATE 25 MG RE SUPP: 25.0000 mg | Freq: Two times a day (BID) | RECTAL | 1 refills | Status: DC

## 2017-08-04 MED ORDER — PANTOPRAZOLE SODIUM 40 MG PO TBEC: 40.0000 mg | DELAYED_RELEASE_TABLET | Freq: Two times a day (BID) | ORAL | 3 refills | Status: DC

## 2017-08-04 NOTE — Patient Instructions (Signed)
We have sent the following medications to your pharmacy for you to pick up at your convenience:  Anusol suppositories insert rectally twice a day for 1 week  Increase pantoprazole 40 mg twice a day. You can use what you already have and then pick up prescription after you run out.

## 2017-08-04 NOTE — Progress Notes (Addendum)
Chief Complaint: Hematochezia, GERD  HPI:    [name] is a 84 year old Caucasian male with a past medical history as listed below including extensive cardiac conditions and metastatic lung cancer on chemotherapy, who has followed remotely with Dr. Carlean Purl and was referred to me by [name], MD for a complaint of hematochezia.      Last colonoscopy 08/10/2002 with adenomatous polyp.    01/22/16 CT abdomen pelvis with contrast showing a large right inguinal hernia containing small bowel obstruction a large hiatal hernia with most of the stomach above the diaphragm.    07/22/17 CMP with a creatinine of 2.29, CBC with a hemoglobin of 12.1 MCV normal at 85.3, platelets low at 119.  Per referring physician's notes his aspirin was held after the bleed.    Per chart review patient follows with Dr. Irene Limbo of oncology for primary metastatic left lung adenocarcinoma of the upper lobe of the left lung.  He is currently on chemotherapy with Tagrisso.  Underwent ultrasound-guided thoracentesis today for pleural effusion.    Today, patient presents with wife and daughter who do assist with history.  Apparently, he had 2 separate bowel movements on 3/5/6 which did have a large amount of bright red blood in them which "filled the toilet".  He has seen no further bleeding since.  Patient does describe some "leakage of stool" after trying to clean himself from a bowel movement which sometimes leaks onto his underwear.  Does describe general history of occasional constipation related to iron supplementation.  Otherwise pretty "normal" bowel movements.    Also describes some indigestion which has been helped by Pantoprazole 40 mg daily but is not gone.  Still requiring intermittent Pepto-Bismol for breakthrough symptoms.    Denies fever, chills, abdominal pain, rectal pain or symptoms that awaken him at night.  Past Medical History:  Diagnosis Date  . Anxiety   . Bradycardia    a. BB d/c'd in 2016.  [name] CAD  (coronary artery disease)    a. 1983 s/p CABG [facility], [place]);  b. 2007 NSTEMI->med Rx for distal LCX dzs; c. 09/2014 NSTEMI/Cath: LIMA->LAD & diag ok, native RCA dominant and patent.   VG->RCA 100, VG->LCX 100 -->Med Rx.  . CKD (chronic kidney disease), stage III ([place])   . Claudication ([place])    BLE  . Depression   . Diastolic dysfunction    a. 10/2014 Echo: F 60-65%, Gr1 DD, mild AS, mild AI, mild to mod MR, PASP 110mmHg.  [name] Dyspnea 2010   syndrome - extensive  . Erectile dysfunction   . GERD (gastroesophageal reflux disease)   . H/O hiatal hernia   . History of blood transfusion 2014  . Hx of adenomatous colonic polyps 2007  . Hyperlipidemia   . Hypertensive heart disease   . Inguinal hernia   . Iron deficiency anemia   . Shingles 1990's  . Sigmoid diverticulosis   . Upper GI bleeding 2014   Gastritis and possible duodenal ulcer seen on EGD, felt secondary to NSAIDs  . Urethral stricture 11/2008  . Vasovagal syncope 05/2006  . Weight loss     Past Surgical History:  Procedure Laterality Date  . CARDIAC CATHETERIZATION  2007   Left main 100%, RCA 70% ostial, all grafts were patent, EF 40-45 percent, distal circumflex subtotal after graft insertion, medical therapy   . CARDIAC CATHETERIZATION N/A 10/14/2014   Procedure: Left Heart Cath and Cors/Grafts Angiography;  Surgeon: Lorretta Harp, MD;  Location: [facility];  Service: Cardiovascular;  Laterality: N/A;  . CHOLECYSTECTOMY  1980's  . CORONARY ARTERY BYPASS GRAFT  1994    LIMA-LAD, SVG-D1, SVG-OM,  SVG-RCA; performed in [place], [place]  . ESOPHAGOGASTRODUODENOSCOPY N/A 03/25/2013   Procedure: ESOPHAGOGASTRODUODENOSCOPY (EGD);  Surgeon: Missy Sabins, MD;  gastritis, cannot rule out duodenal ulcer   . INGUINAL HERNIA REPAIR Left   . TRANSURETHRAL RESECTION OF PROSTATE      Current Outpatient Medications  Medication Sig Dispense Refill  . acetaminophen (TYLENOL) 325 MG tablet Take 2 tablets (650 mg total) by  mouth every 4 (four) hours as needed for headache or mild pain.    [name] albuterol (PROAIR HFA) 108 (90 Base) MCG/ACT inhaler Inhale 2 puffs into the lungs every 6 (six) hours as needed for wheezing or shortness of breath.    [name] amLODipine (NORVASC) 5 MG tablet Take 1 tablet (5 mg total) by mouth daily. 90 tablet 0  . BREO ELLIPTA 100-25 MCG/INH AEPB 1 PUFF BY MOUTH DAILY  11  . buPROPion (WELLBUTRIN XL) 150 MG 24 hr tablet Take 150 mg by mouth daily.     [name] desvenlafaxine (PRISTIQ) 50 MG 24 hr tablet Take 25 mg by mouth daily.     [name] dexamethasone (DECADRON) 2 MG tablet Take 1 tablet (2 mg total) by mouth daily with breakfast. 30 tablet 0  . FERROCITE 324 MG TABS tablet TAKE 1 TABLET BY MOUTH QOD  11  . isosorbide mononitrate (IMDUR) 30 MG 24 hr tablet Take 30 mg by mouth daily.    [name] LYRICA 50 MG capsule Take 50 mg by mouth daily as needed (pain).     . nitroGLYCERIN (NITROSTAT) 0.4 MG SL tablet Place 1 tablet (0.4 mg total) under the tongue every 5 (five) minutes x 3 doses as needed for chest pain. 25 tablet 4  . Omega-3 Fatty Acids (FISH OIL PO) Take 1 capsule by mouth daily.    . OXYGEN Inhale 2 L into the lungs continuous.    [name] TAGRISSO 80 MG tablet TAKE 1 TABLET (80 MG TOTAL) BY MOUTH DAILY. 30 tablet 2  . vitamin B-12 (CYANOCOBALAMIN) 1000 MCG tablet Take 1,000 mcg by mouth every evening.     . hydrocortisone (ANUSOL-HC) 25 MG suppository Place 1 suppository (25 mg total) rectally every 12 (twelve) hours. 14 suppository 1  . pantoprazole (PROTONIX) 40 MG tablet Take 1 tablet (40 mg total) by mouth 2 (two) times daily before a meal. 60 tablet 3   No current facility-administered medications for this visit.     Allergies as of 08/04/2017  . (No Known Allergies)    Family History  Problem Relation Age of Onset  . Hypertension Mother   . Heart disease Mother   . Heart disease Father   . Heart failure Brother   . Heart failure Sister   . Heart failure Brother   . Heart attack Neg Hx   .  Stroke Neg Hx     Social History   Socioeconomic History  . Marital status: Married    Spouse name: Not on file  . Number of children: Not on file  . Years of education: Not on file  . Highest education level: Not on file  Social Needs  . Financial resource strain: Not on file  . Food insecurity - worry: Not on file  . Food insecurity - inability: Not on file  . Transportation needs - medical: Not on file  . Transportation needs - non-medical: Not on file  Occupational  History  . Occupation: Retired  Tobacco Use  . Smoking status: Never Smoker  . Smokeless tobacco: Never Used  Substance and Sexual Activity  . Alcohol use: Yes    Alcohol/week: 4.2 oz    Types: 7 Glasses of wine per week    Comment: wine - daily - currently not drinking  . Drug use: No  . Sexual activity: No    Birth control/protection: Abstinence  Other Topics Concern  . Not on file  Social History Narrative   He lives in [facility], independent living, with his wife.    Review of Systems:    Constitutional: No weight loss, fever or chills Skin: No rash  Cardiovascular: No chest pain Respiratory: +SOB Gastrointestinal: See HPI and otherwise negative Genitourinary: No dysuria  Neurological: No headache, dizziness or syncope Musculoskeletal: No new muscle or joint pain Hematologic: No bruising Psychiatric: No history of depression or anxiety   Physical Exam:  Vital signs: BP 134/78   Pulse 80   Ht 5\' 6"  (1.676 m)   Wt 150 lb 8 oz (68.3 kg)   BMI 24.29 kg/m   Constitutional:   Pleasant Elderly Caucasian male appears to be in NAD, Well developed, Well nourished, alert and cooperative Head:  Normocephalic and atraumatic. Eyes:   PEERL, EOMI. No icterus. Conjunctiva pink. Ears:  Normal auditory acuity. Neck:  Supple Throat: Oral cavity and pharynx without inflammation, swelling or lesion.  Respiratory: Respirations even and unlabored. Lungs clear to auscultation bilaterally.   No wheezes,  crackles, or rhonchi.  Cardiovascular: Normal S1, S2. No MRG. Regular rate and rhythm. No peripheral edema, cyanosis or pallor.  Gastrointestinal:  Soft, nondistended, nontender. No rebound or guarding. Normal bowel sounds. No appreciable masses or hepatomegaly. Rectal:  External: some dried brown stool; internal with mild ttp worse on left; Anoscopy: inflamed grade I hemorrhoids with stigmata of recent bleeding Msk:  Symmetrical without gross deformities. Without edema, no deformity or joint abnormality.  Neurologic:  Alert and  oriented x4;  grossly normal neurologically.  Skin:   Dry and intact without significant lesions or rashes. Psychiatric :Demonstrates good judgement and reason without abnormal affect or behaviors.  RELEVANT LABS AND IMAGING: CBC    Component Value Date/Time   WBC 5.4 08/01/2017 1106   WBC 8.9 07/29/2017 0700   RBC 3.92 (L) 08/01/2017 1106   RBC 3.92 (L) 08/01/2017 1106   HGB 12.1 (L) 07/29/2017 0700   HGB 13.5 05/23/2017 0948   HCT 34.9 (L) 08/01/2017 1106   HCT 41.9 05/23/2017 0948   PLT 138 (L) 08/01/2017 1106   PLT 122 (L) 05/23/2017 0948   MCV 89.0 08/01/2017 1106   MCV 87.4 05/23/2017 0948   MCH 28.1 08/01/2017 1106   MCHC 31.5 (L) 08/01/2017 1106   RDW 15.2 (H) 08/01/2017 1106   RDW 14.9 (H) 05/23/2017 0948   LYMPHSABS 1.0 08/01/2017 1106   LYMPHSABS 1.0 05/23/2017 0948   MONOABS 0.7 08/01/2017 1106   MONOABS 0.7 05/23/2017 0948   EOSABS 0.1 08/01/2017 1106   EOSABS 0.4 05/23/2017 0948   BASOSABS 0.0 08/01/2017 1106   BASOSABS 0.1 05/23/2017 0948    CMP     Component Value Date/Time   NA 142 08/01/2017 1106   NA 138 07/24/2017   NA 140 05/23/2017 0948   K 4.5 08/01/2017 1106   K 5.0 05/23/2017 0948   CL 109 08/01/2017 1106   CL 111 07/23/2017   CO2 25 08/01/2017 1106   CO2 24 07/23/2017   CO2  24 05/23/2017 0948   GLUCOSE 65 (L) 08/01/2017 1106   GLUCOSE 81 05/23/2017 0948   BUN 26 08/01/2017 1106   BUN 30 (A) 07/24/2017   BUN  41.7 (H) 05/23/2017 0948   CREATININE 1.69 (H) 08/01/2017 1106   CREATININE 2.5 (H) 05/23/2017 0948   CALCIUM 9.3 08/01/2017 1106   CALCIUM 8.5 07/23/2017   CALCIUM 9.2 05/23/2017 0948   PROT 6.4 08/01/2017 1106   PROT 5.7 07/22/2017   PROT 6.9 05/23/2017 0948   ALBUMIN 3.0 (L) 08/01/2017 1106   ALBUMIN 3.6 07/22/2017   ALBUMIN 3.6 05/23/2017 0948   AST 17 08/01/2017 1106   AST 15 05/23/2017 0948   ALT 13 08/01/2017 1106   ALT 16 05/23/2017 0948   ALKPHOS 64 08/01/2017 1106   ALKPHOS 67 05/23/2017 0948   BILITOT 0.3 08/01/2017 1106   BILITOT 0.41 05/23/2017 0948   GFRNONAA 34 (L) 08/01/2017 1106   GFRNONAA 24 07/22/2017   GFRAA 39 (L) 08/01/2017 1106    Assessment: 1.  Rectal bleeding: 2 episodes of bright red blood per rectum with a bowel movement on 07/23/17 2.  Internal hemorrhoids: Seen at time of exam today with stigmata of recent bleeding, likely source of bleeding above 3.  GERD: Helped by Pantoprazole 40 mg once daily, but still with some breakthrough  Plan: 1.  Increased Pantoprazole to 40 mg twice daily, 30-60 minutes for breakfast and dinner. 2.  Discussed internal hemorrhoids are the likely source of recent bleeding as it has not recurred and patient's hemoglobin is stable. 3.  Prescribed Hydrocortisone suppositories twice daily times 7 days with 1 refill. 4.  Discussed with patient that he would be very high risk for any endoscopic procedures due to his various cardiac conditions as well as lung cancer and advanced age.  We will try to avoid these if possible.  Patient was told to notify us if he sees any further bleeding. 5.  Patient will follow in clinic as needed with Dr. Carlean Purl.  Scott Newer, PA-C [facility] 08/04/2017, 3:47 PM  Cc: [name], MD   Agree with Ms. Jalin Erpelding's evaluation and management.  Gatha Mayer, MD, [facility]

## 2017-08-08 ENCOUNTER — Ambulatory Visit (HOSPITAL_COMMUNITY)
Admission: RE | Admit: 2017-08-08 | Discharge: 2017-08-08 | Disposition: A | Discharge status: Home / Self Care | Payer: Medicare Other | Source: Ambulatory Visit | Attending: Hematology | Admitting: Hematology

## 2017-08-08 ENCOUNTER — Ambulatory Visit (HOSPITAL_COMMUNITY)
Admission: RE | Admit: 2017-08-08 | Discharge: 2017-08-08 | Disposition: A | Discharge status: Home / Self Care | Payer: Medicare Other | Source: Ambulatory Visit | Attending: Radiology | Admitting: Radiology

## 2017-08-08 DIAGNOSIS — J910 Malignant pleural effusion: Secondary | ICD-10-CM | POA: Diagnosis not present

## 2017-08-08 DIAGNOSIS — K449 Diaphragmatic hernia without obstruction or gangrene: Secondary | ICD-10-CM | POA: Diagnosis not present

## 2017-08-08 DIAGNOSIS — J9811 Atelectasis: Secondary | ICD-10-CM | POA: Insufficient documentation

## 2017-08-08 DIAGNOSIS — C3492 Malignant neoplasm of unspecified part of left bronchus or lung: Secondary | ICD-10-CM | POA: Diagnosis not present

## 2017-08-08 DIAGNOSIS — Z9889 Other specified postprocedural states: Secondary | ICD-10-CM | POA: Insufficient documentation

## 2017-08-08 DIAGNOSIS — C3412 Malignant neoplasm of upper lobe, left bronchus or lung: Secondary | ICD-10-CM

## 2017-08-08 DIAGNOSIS — J90 Pleural effusion, not elsewhere classified: Secondary | ICD-10-CM | POA: Diagnosis not present

## 2017-08-08 MED ORDER — LIDOCAINE HCL 1 % IJ SOLN
INTRAMUSCULAR | Status: AC
  Filled 2017-08-08: qty 10

## 2017-08-08 NOTE — Procedures (Signed)
Ultrasound-guided  therapeutic left  thoracentesis performed yielding 1.5 liters of yellow fluid. No immediate complications. Follow-up chest x-ray pending. Due to pt coughing and left neck discomfort only the above amount of fluid was removed today.

## 2017-08-11 ENCOUNTER — Telehealth: Payer: Self-pay

## 2017-08-11 ENCOUNTER — Telehealth: Payer: Self-pay | Admitting: Hematology

## 2017-08-11 DIAGNOSIS — R2689 Other abnormalities of gait and mobility: Secondary | ICD-10-CM | POA: Diagnosis not present

## 2017-08-11 DIAGNOSIS — R2681 Unsteadiness on feet: Secondary | ICD-10-CM | POA: Diagnosis not present

## 2017-08-11 DIAGNOSIS — M6281 Muscle weakness (generalized): Secondary | ICD-10-CM | POA: Diagnosis not present

## 2017-08-11 DIAGNOSIS — F418 Other specified anxiety disorders: Secondary | ICD-10-CM | POA: Diagnosis not present

## 2017-08-11 DIAGNOSIS — D509 Iron deficiency anemia, unspecified: Secondary | ICD-10-CM | POA: Diagnosis not present

## 2017-08-11 DIAGNOSIS — Z9181 History of falling: Secondary | ICD-10-CM | POA: Diagnosis not present

## 2017-08-11 NOTE — Telephone Encounter (Signed)
Patient is scheduled to see Dr. Bubba Camp in clinic tomorrow 08/12/17 @ 9:30 am

## 2017-08-11 NOTE — Telephone Encounter (Signed)
Patients daughter has some concerns about pallative care for her father. Per Dr. Bubba Camp she wanted to see if the doctor would be available to come in tomorrow discuss her concerns. I was unable to get in contact with the patient's daughter. I left her a voicemail to call me in the clinic her at [facility] [place].

## 2017-08-11 NOTE — Telephone Encounter (Signed)
Returned call to patients daughter .  She requested appts to be rescheduled for afternoon instead of morning. Appointments moved per 3/25 phone message

## 2017-08-12 ENCOUNTER — Encounter: Payer: Self-pay | Admitting: Nurse Practitioner

## 2017-08-12 ENCOUNTER — Non-Acute Institutional Stay: Payer: Medicare Other | Admitting: Internal Medicine

## 2017-08-12 ENCOUNTER — Encounter: Payer: Self-pay | Admitting: Internal Medicine

## 2017-08-12 VITALS — BP 120/62 | HR 88 | Temp 97.6°F | Resp 16 | Ht 66.0 in | Wt 151.2 lb

## 2017-08-12 DIAGNOSIS — I25709 Atherosclerosis of coronary artery bypass graft(s), unspecified, with unspecified angina pectoris: Secondary | ICD-10-CM | POA: Diagnosis not present

## 2017-08-12 DIAGNOSIS — J90 Pleural effusion, not elsewhere classified: Secondary | ICD-10-CM | POA: Insufficient documentation

## 2017-08-12 DIAGNOSIS — Z7189 Other specified counseling: Secondary | ICD-10-CM | POA: Diagnosis not present

## 2017-08-12 DIAGNOSIS — R5381 Other malaise: Secondary | ICD-10-CM | POA: Diagnosis not present

## 2017-08-12 DIAGNOSIS — K648 Other hemorrhoids: Secondary | ICD-10-CM | POA: Diagnosis not present

## 2017-08-12 NOTE — ACP (Advance Care Planning) (Signed)
  Reviewed goals of care with patient with his wife and daughter present. They want a DNR for him. DNR form filled out and signed. Patient has a living will, copy requested. Encouraged to have HCPOA paperwork made. Reviewed MOST form with the patient and his family. In absence of pulse or breath, he would like to be DNR. He would want iv fluids if indicated and antibiotic to treat infection if indicated. He does not want feeding tube. Patient and family are not sure about medical interventions in presence of pulse and breathing. They do not want intubation and mechanical ventilation. They would like to discuss this further with his other daughter and decide. Form provided for patient to take home. Spent time from 9:20 am- 9:40 am reviewing goals of care.

## 2017-08-12 NOTE — Progress Notes (Signed)
[facility]  Provider: [name] MD   Location:      Place of Service:     PCP: [name], MD Patient Care Team: [name], MD as PCP - General (Internal Medicine) Mast, [name], NP as Nurse Practitioner (Internal Medicine)  Extended Emergency Contact Information Primary Emergency Contact: [facility] Address: [address]          [place], [place] [address] Johnnette Litter of [place] Phone: [phone] Relation: Spouse Secondary Emergency Contact: Dimond,[name] Address: [place],  [address] Johnnette Litter of [place] Phone: [phone] Work Phone: [phone] Mobile Phone: [phone] Relation: Daughter  Code Status: DNR  Goals of Care: Advanced Directive information Advanced Directives 08/12/2017  Does Patient Have a Medical Advance Directive? Yes  Type of Advance Directive Living will;Healthcare Power of Attorney  Does patient want to make changes to medical advance directive? Yes (MAU/Ambulatory/Procedural Areas - Information given)  Copy of [place] in Chart? No - copy requested  Would patient like information on creating a medical advance directive? -      Chief Complaint  Patient presents with  . Acute Visit    family concerns    HPI: Patient is a 84 y.o. male seen today for acute visit. Patient here with his wife and daughter today. Daughter feels he needs help with his ADLs like medication management, toileting and bathing. She does not want him to move to assisted living but would like for home health services to be provided if possible. His wife at present is the primary caregiver. Patient has history of primary adenocarcinoma of upper lobe of left lung, CAD, recurrent left pleural effusion, CKD stage 4 and HTN among others.   Past Medical History:  Diagnosis Date  . Anxiety   . Bradycardia    a. BB d/c'd in 2016.  [name] CAD (coronary artery  disease)    a. 1983 s/p CABG [facility], [place]);  b. 2007 NSTEMI->med Rx for distal LCX dzs; c. 09/2014 NSTEMI/Cath: LIMA->LAD & diag ok, native RCA dominant and patent.   VG->RCA 100, VG->LCX 100 -->Med Rx.  . CKD (chronic kidney disease), stage III ([place])   . Claudication ([place])    BLE  . Depression   . Diastolic dysfunction    a. 10/2014 Echo: F 60-65%, Gr1 DD, mild AS, mild AI, mild to mod MR, PASP 49mmHg.  [name] Dyspnea 2010   syndrome - extensive  . Erectile dysfunction   . GERD (gastroesophageal reflux disease)   . H/O hiatal hernia   . History of blood transfusion 2014  . Hx of adenomatous colonic polyps 2007  . Hyperlipidemia   . Hypertensive heart disease   . Inguinal hernia   . Iron deficiency anemia   . Shingles 1990's  . Sigmoid diverticulosis   . Upper GI bleeding 2014   Gastritis and possible duodenal ulcer seen on EGD, felt secondary to NSAIDs  . Urethral stricture 11/2008  . Vasovagal syncope 05/2006  . Weight loss    Past Surgical History:  Procedure Laterality Date  . CARDIAC CATHETERIZATION  2007   Left main 100%, RCA 70% ostial, all grafts were patent, EF 40-45 percent, distal circumflex subtotal after graft insertion, medical therapy   . CARDIAC CATHETERIZATION N/A 10/14/2014   Procedure: Left Heart Cath and Cors/Grafts Angiography;  Surgeon: Lorretta Harp, MD;  Location: [facility];  Service: Cardiovascular;  Laterality: N/A;  . CHOLECYSTECTOMY  1980's  . CORONARY ARTERY BYPASS GRAFT  1994    LIMA-LAD, SVG-D1, SVG-OM,  SVG-RCA; performed in [place], [place]  . ESOPHAGOGASTRODUODENOSCOPY N/A 03/25/2013   Procedure: ESOPHAGOGASTRODUODENOSCOPY (EGD);  Surgeon: Missy Sabins, MD;  gastritis, cannot rule out duodenal ulcer   . INGUINAL HERNIA REPAIR Left   . TRANSURETHRAL RESECTION OF PROSTATE      reports that he has never smoked. He has never used smokeless tobacco. He reports that he drinks about 4.2 oz of alcohol per week. He reports that he does not  use drugs. Social History   Socioeconomic History  . Marital status: Married    Spouse name: Not on file  . Number of children: Not on file  . Years of education: Not on file  . Highest education level: Not on file  Occupational History  . Occupation: Retired  Scientific laboratory technician  . Financial resource strain: Not on file  . Food insecurity:    Worry: Not on file    Inability: Not on file  . Transportation needs:    Medical: Not on file    Non-medical: Not on file  Tobacco Use  . Smoking status: Never Smoker  . Smokeless tobacco: Never Used  Substance and Sexual Activity  . Alcohol use: Yes    Alcohol/week: 4.2 oz    Types: 7 Glasses of wine per week    Comment: wine - daily - currently not drinking  . Drug use: No  . Sexual activity: Never    Birth control/protection: Abstinence  Lifestyle  . Physical activity:    Days per week: Not on file    Minutes per session: Not on file  . Stress: Not on file  Relationships  . Social connections:    Talks on phone: Not on file    Gets together: Not on file    Attends religious service: Not on file    Active member of club or organization: Not on file    Attends meetings of clubs or organizations: Not on file    Relationship status: Not on file  . Intimate partner violence:    Fear of current or ex partner: Not on file    Emotionally abused: Not on file    Physically abused: Not on file    Forced sexual activity: Not on file  Other Topics Concern  . Not on file  Social History Narrative   He lives in [facility], independent living, with his wife.     Family History  Problem Relation Age of Onset  . Hypertension Mother   . Heart disease Mother   . Heart disease Father   . Heart failure Brother   . Heart failure Sister   . Heart failure Brother   . Heart attack Neg Hx   . Stroke Neg Hx     Health Maintenance  Topic Date Due  . PNA vac Low Risk Adult (2 of 2 - PCV13) 05/20/2018  . TETANUS/TDAP  07/11/2022  .  INFLUENZA VACCINE  Completed    No Known Allergies  Outpatient Encounter Medications as of 08/12/2017  Medication Sig  . acetaminophen (TYLENOL) 325 MG tablet Take 2 tablets (650 mg total) by mouth every 4 (four) hours as needed for headache or mild pain.  [name] albuterol (PROAIR HFA) 108 (90 Base) MCG/ACT inhaler Inhale 2 puffs into the lungs every 6 (six) hours as needed for wheezing or shortness of  breath.  [name] amLODipine (NORVASC) 5 MG tablet Take 1 tablet (5 mg total) by mouth daily.  [name] BREO ELLIPTA 100-25 MCG/INH AEPB 1 PUFF BY MOUTH DAILY  . buPROPion (WELLBUTRIN XL) 150 MG 24 hr tablet Take 150 mg by mouth daily.   [name] desvenlafaxine (PRISTIQ) 50 MG 24 hr tablet Take 25 mg by mouth daily.   [name] dexamethasone (DECADRON) 2 MG tablet Take 1 tablet (2 mg total) by mouth daily with breakfast.  . FERROCITE 324 MG TABS tablet TAKE 1 TABLET BY MOUTH QOD  . hydrocortisone (ANUSOL-HC) 25 MG suppository Place 1 suppository (25 mg total) rectally every 12 (twelve) hours.  . isosorbide mononitrate (IMDUR) 30 MG 24 hr tablet Take 30 mg by mouth daily.  [name] LYRICA 50 MG capsule Take 50 mg by mouth daily as needed (pain).   . nitroGLYCERIN (NITROSTAT) 0.4 MG SL tablet Place 1 tablet (0.4 mg total) under the tongue every 5 (five) minutes x 3 doses as needed for chest pain.  . Omega-3 Fatty Acids (FISH OIL PO) Take 1 capsule by mouth daily.  . pantoprazole (PROTONIX) 40 MG tablet Take 1 tablet (40 mg total) by mouth 2 (two) times daily before a meal.  . TAGRISSO 80 MG tablet TAKE 1 TABLET (80 MG TOTAL) BY MOUTH DAILY.  . vitamin B-12 (CYANOCOBALAMIN) 1000 MCG tablet Take 1,000 mcg by mouth every evening.   . OXYGEN Inhale 2 L into the lungs continuous.   No facility-administered encounter medications on file as of 08/12/2017.     Review of Systems  Constitutional: Negative for appetite change, fatigue and fever.  HENT: Negative for mouth sores and trouble swallowing.   Respiratory: Positive for shortness of  breath. Negative for cough.        Dyspnea with exertion  Cardiovascular: Negative for chest pain and palpitations.  Gastrointestinal: Negative for abdominal pain, nausea and vomiting.  Musculoskeletal: Positive for gait problem.       No fall reported, uses cane  Neurological: Negative for dizziness and headaches.  Psychiatric/Behavioral: Positive for confusion. Negative for behavioral problems.    Vitals:   08/12/17 0837  BP: 120/62  Pulse: 88  Resp: 16  Temp: 97.6 F (36.4 C)  TempSrc: Oral  SpO2: 95%  Weight: 151 lb 3.2 oz (68.6 kg)  Height: 5\' 6"  (1.676 m)   Body mass index is 24.4 kg/m.   Wt Readings from Last 3 Encounters:  08/12/17 151 lb 3.2 oz (68.6 kg)  08/04/17 150 lb 8 oz (68.3 kg)  08/01/17 150 lb 11.2 oz (68.4 kg)   Physical Exam  Constitutional: No distress.  Frail, elderly male in no acute distress  HENT:  Head: Normocephalic and atraumatic.  Nose: Nose normal.  Mouth/Throat: Oropharynx is clear and moist.  Eyes: Pupils are equal, round, and reactive to light. Conjunctivae are normal. Right eye exhibits no discharge. Left eye exhibits no discharge.  Neck: Neck supple.  Cardiovascular: Normal rate and regular rhythm.  Pulmonary/Chest: Effort normal. He has no wheezes. He has no rales.  Decreased air entry to both lungs  Abdominal: Soft. Bowel sounds are normal. There is no tenderness.  Lymphadenopathy:    He has no cervical adenopathy.  Neurological: He is alert.  Oriented to person and place  Skin: Skin is warm and dry. No rash noted. He is not diaphoretic.  Psychiatric: He has a normal mood and affect.    Labs reviewed: Basic Metabolic Panel: Recent Labs    05/23/17 0947 05/23/17 0948 06/20/17  1054  07/22/17 07/23/17 07/24/17 08/01/17 1106  NA  --  140 140   < > 139 140 138 142  K  --  5.0 5.4*  --  4.9 4.3 4.8 4.5  CL  --   --  107  --  106 111  --  109  CO2  --  24 26  --  26 24  --  25  GLUCOSE  --  81 104  --   --   --   --  65*    BUN  --  41.7* 35*   < > 45* 37* 30* 26  CREATININE  --  2.5* 2.30*   < > 2.3* 1.8* 1.9* 1.69*  CALCIUM  --  9.2 9.3  --  9.3 8.5  --  9.3  MG  --  3.2* 3.0*  --   --   --   --  2.4  PHOS 3.7  --  3.4  --   --   --   --   --    < > = values in this interval not displayed.   Liver Function Tests: Recent Labs    05/23/17 0948 06/20/17 1054 07/22/17 08/01/17 1106  AST 15 20 18 17   ALT 16 24 15 13   ALKPHOS 67 58 52 64  BILITOT 0.41 0.4  --  0.3  PROT 6.9 6.7 5.7 6.4  ALBUMIN 3.6 3.5 3.6 3.0*   Recent Labs    04/09/17 1352  LIPASE 48   No results for input(s): AMMONIA in the last 8760 hours. CBC: Recent Labs    05/23/17 0948 06/20/17 1054  07/23/17 07/24/17 07/29/17 0700 08/01/17 1106  WBC 8.4 5.7   < > 5.5 63.0  6.3 8.9 5.4  NEUTROABS 6.2 3.7  --   --   --   --  3.6  HGB 13.5  --    < > 10.9* 11.3* 12.1*  --   HCT 41.9 42.9   < > 33* 34* 35.4* 34.9*  MCV 87.4 90.7  --   --   --  83.7 89.0  PLT 122* 103*   < > 104* 123* 174 138*   < > = values in this interval not displayed.   Cardiac Enzymes: No results for input(s): CKTOTAL, CKMB, CKMBINDEX, TROPONINI in the last 8760 hours. BNP: Invalid input(s): POCBNP No results found for: HGBA1C Lab Results  Component Value Date   TSH 2.186 01/22/2016   Lab Results  Component Value Date   NIDPOEUM35 361 01/23/2016   No results found for: FOLATE No results found for: IRON, TIBC, FERRITIN  Lipid Panel: No results for input(s): CHOL, HDL, LDLCALC, TRIG, CHOLHDL, LDLDIRECT in the last 8760 hours. No results found for: HGBA1C  Procedures since last visit: Dg Chest 1 View  Result Date: 08/08/2017 CLINICAL DATA:  Status post thoracentesis EXAM: CHEST  1 VIEW COMPARISON:  Chest radiograph June 26, 2017 and chest CT August 01, 2017 FINDINGS: Left pleural effusion is much smaller following thoracentesis. There is a small residual left pleural effusion with atelectatic change in the left base. Right lung is clear. Heart is  borderline enlarged with pulmonary vascularity within normal limits. Patient is status post internal mammary bypass grafting. There is a sizable hiatal hernia. No adenopathy. No bone lesions. IMPRESSION: No pneumothorax. Small pleural effusion with left base atelectasis. Lungs elsewhere clear. Stable cardiac prominence. Sizable hiatal hernia. Electronically Signed   By: Lowella Grip III M.D.   On: 08/08/2017 11:01  Ct Chest Wo Contrast  Result Date: 08/01/2017 CLINICAL DATA:  Follow-up lung cancer. EXAM: CT CHEST WITHOUT CONTRAST TECHNIQUE: Multidetector CT imaging of the chest was performed following the standard protocol without IV contrast. COMPARISON:  06/26/2017 FINDINGS: Cardiovascular: Normal heart size. No pericardial effusion. Previous median sternotomy and CABG procedure. Aortic atherosclerosis noted. Mediastinum/Nodes: Low-density nodule within the inferior pole of right lobe of thyroid gland measures 1.5 cm. The trachea appears patent and is midline. Large hiatal hernia. No enlarged supraclavicular or axillary adenopathy. No enlarged mediastinal lymph nodes. Calcified left hilar nodes identified. Lungs/Pleura: Increase in volume of left pleural effusion. There is progressive volume loss within the left upper lobe. Central perihilar left upper lobe mass is identified with postobstructive atelectasis involving the basilar portion of the left upper lobe. This measures approximately 2.4 x 4.5 cm, image 75/series 2. Upper Abdomen: Low-attenuation nodule in right adrenal gland is identified measuring 1.7 cm, image 133/series 2. Unchanged from previous exam. Musculoskeletal: No aggressive lytic or sclerotic bone lesions identified. IMPRESSION: 1. Increase in volume of left pleural effusion. 2. Central perihilar area of masslike architectural distortion is nonspecific but results and postobstructive, subsegmental atelectasis of the basilar portion of the left upper lobe with associated volume loss.  Although findings may reflect post treatment changes underlying tumor cannot be excluded. Consider follow-up imaging with PET-CT. 3. Right lobe of thyroid gland nodule. Consider further evaluation with thyroid ultrasound. If patient is clinically hyperthyroid, consider nuclear medicine thyroid uptake and scan. 4.  Aortic Atherosclerosis (ICD10-I70.0). 5. Hiatal hernia. Electronically Signed   By: Kerby Moors M.D.   On: 08/01/2017 14:22   US Thoracentesis Asp Pleural Space W/img Guide  Result Date: 08/08/2017 INDICATION: Patient with history of left lung adenocarcinoma with recurrent malignant left pleural effusion. Request made for therapeutic left thoracentesis. EXAM: ULTRASOUND GUIDED THERAPEUTIC LEFT THORACENTESIS MEDICATIONS: None COMPLICATIONS: None immediate. PROCEDURE: An ultrasound guided thoracentesis was thoroughly discussed with the patient and questions answered. The benefits, risks, alternatives and complications were also discussed. The patient understands and wishes to proceed with the procedure. Written consent was obtained. Ultrasound was performed to localize and mark an adequate pocket of fluid in the left chest. The area was then prepped and draped in the normal sterile fashion. 1% Lidocaine was used for local anesthesia. Under ultrasound guidance a 6 Fr Safe-T-Centesis catheter was introduced. Thoracentesis was performed. The catheter was removed and a dressing applied. FINDINGS: A total of approximately 1.5 liters of yellow fluid was removed. Due to patient coughing and some left neck discomfort only the above amount of fluid was removed today. IMPRESSION: Successful ultrasound guided therapeutic left thoracentesis yielding 1.5 liters of pleural fluid. Follow-up chest x-ray revealed no pneumothorax. Read by: Rowe Robert, PA-C Electronically Signed   By: Jacqulynn Cadet M.D.   On: 08/08/2017 12:00    Assessment/Plan  1. Goals of care, counseling/discussion Last ACP Note  05/14/2017 to 08/12/2017       ACP (Advance Care Planning) by [name], MD at 08/12/2017  9:30 AM    Date of Service   Author Author Type Status Note Type File Time  08/12/2017 Addend [name], MD Physician Signed ACP (Advance Care Planning) 08/12/2017              Reviewed goals of care with patient with his wife and daughter present. They want a DNR for him. DNR form filled out and signed. Patient has a living will, copy requested. Encouraged to have HCPOA paperwork made. Reviewed MOST form  with the patient and his family. In absence of pulse or breath, he would like to be DNR. He would want iv fluids if indicated and antibiotic to treat infection if indicated. He does not want feeding tube. Patient and family are not sure about medical interventions in presence of pulse and breathing. They do not want intubation and mechanical ventilation. They would like to discuss this further with his other daughter and decide. Form provided for patient to take home. Spent time from 9:20 am- 9:40 am reviewing goals of care.                2. Physical deconditioning With his age and multiple medical co-morbidities, will provide referral for home health agency to evaluate for need for assistance with his ADLs and provide services.   3. Internal hemorrhoids No further rectal bleed. Using his suppository, monitor for constipation, hydration to be maintained.   Labs/tests ordered: none  Next appointment: has f/u with NP  reviewed care plan with patient, his wife and daughter   [name], MD Internal Medicine [place],  [id number] Cell Phone (Monday-Friday 8 am - 5 pm): [phone] On Call: [phone] and follow prompts after 5 pm and on weekends Office Phone: [phone] Office Fax: [phone]

## 2017-08-13 DIAGNOSIS — F418 Other specified anxiety disorders: Secondary | ICD-10-CM | POA: Diagnosis not present

## 2017-08-13 DIAGNOSIS — Z9181 History of falling: Secondary | ICD-10-CM | POA: Diagnosis not present

## 2017-08-13 DIAGNOSIS — R2681 Unsteadiness on feet: Secondary | ICD-10-CM | POA: Diagnosis not present

## 2017-08-13 DIAGNOSIS — R2689 Other abnormalities of gait and mobility: Secondary | ICD-10-CM | POA: Diagnosis not present

## 2017-08-13 DIAGNOSIS — D509 Iron deficiency anemia, unspecified: Secondary | ICD-10-CM | POA: Diagnosis not present

## 2017-08-13 DIAGNOSIS — M6281 Muscle weakness (generalized): Secondary | ICD-10-CM | POA: Diagnosis not present

## 2017-08-13 NOTE — Progress Notes (Signed)
HEMATOLOGY/ONCOLOGY CLINIC NOTE  Date of Service: 08/14/17  Patient Care Team: [name], MD as PCP - General (Internal Medicine) Mast, [name], NP as Nurse Practitioner (Internal Medicine)  CHIEF COMPLAINTS/PURPOSE OF CONSULTATION:   F/u for EGFR mutated metastatic lung adenocarcinoma  HISTORY OF PRESENTING ILLNESS:   Scott [name] is a wonderful 84 y.o. male who has been referred to [place] by Dr. [name], MD for evaluation of suspected lung cancer.   He has a hx of CAD, CKD, Diastolic CHF, HTN, HLD. He presented to the ED on 03/03/2017 for SOB and was found to have pleural effusion. He was also to have a mass in the upper lobe of left lung after a CT chest Wo contrast with results showing:IMPRESSION:1. 2.4 x 3.6 cm apparent mass in the posterior left upper lobe with associated left upper lobe interlobular septal thickening. Imaging features concerning for neoplasm with lymphangitic tumor spread. 2. Small left pleural effusion. 3. Large hiatal hernia contains nearly the entire stomach with evidence of organoaxial volvulus. No evidence for gastric obstruction. 4. 19 mm right adrenal adenoma.  He is accompanied by his wife and daughter. He states that he began feeling SOB 2 weeks ago. He is not a smoker and has not smoked in the past. He states that he has not been evaluated by his cardiologist, Dr. Larae Grooms. For his recent SOB.  On review of systems, patient reports fatigue, intermittent SOB, and denies fever, chills, CP, cough, weight loss, and any other accompanying symptoms.  INTERVAL HISTORY:   Scott [name] is here for f/u of his recently diagnosed lung adenocarcinoma.The patient's last visit with [place] was on 08/01/17. He is accompanied today by his wife and daughter. The pt reports that he is doing well overall.   The pt reports that his thoracentesis has greatly reduced his SOB and helped him to feel much better overall. He notes that his appetite has  increased as well. He notes no new concerns and is very happy that his condition has improved so well. He notes that each day is better than the last. He notes that he has taken 57m Dexamethasone without trouble and this has significantly improved his appetite  Of note since the patient's last visit, pt has had CXR completed on 08/08/17 with results revealing No pneumothorax. Small pleural effusion with left base atelectasis. Lungs elsewhere clear. Stable cardiac prominence. Sizable hiatal hernia. On 08/08/17 the pt also had an [place] therapeutic thoracent[place].   Lab results today (08/14/17) of CBC, CMP, and Reticulocytes is as follows: all values are WNL except for Hgb at 12.2, HCT at 38.3, MCHC at 31.8, RDW at 15.8, Neutro Abs at 7.6k, BUN at 31, Creatinine at 1.76, Albumin at 3.2.  On review of systems, pt reports decreased SOB, increased appetite, sleeping well, and denies fatigue, abdominal pains, leg swelling, and any other symptoms.   MEDICAL HISTORY:  Past Medical History:  Diagnosis Date  . Anxiety   . Bradycardia    a. BB d/c'd in 2016.  .Marland KitchenCAD (coronary artery disease)    a. [place] [facility]  b. 2007 NSTEMI->med Rx for distal LCX dzs; c. 09/2014 NSTEMI/Cath: LIMA->LAD & diag ok, native RCA dominant and patent.   VG->RCA 100, VG->LCX 100 -->Med Rx.  . [place]   [place]  . Depression   . Diastolic dysfunction    a. 10/2014 Echo: F 60-65%,  Gr1 DD, mild AS, mild AI, mild to mod MR, PASP 68mHg.  .Marland KitchenDyspnea 2010   syndrome - extensive  . Erectile dysfunction   . GERD (gastroesophageal reflux disease)   . H/O hiatal hernia   . History of blood transfusion 2014  . Hx of adenomatous colonic polyps 2007  . Hyperlipidemia   . Hypertensive heart disease   . Inguinal hernia   . Iron deficiency anemia   . Shingles 1990's  . Sigmoid diverticulosis   . Upper GI bleeding 2014   Gastritis and possible duodenal ulcer  seen on EGD, felt secondary to NSAIDs  . Urethral stricture 11/2008  . Vasovagal syncope 05/2006  . Weight loss     SURGICAL HISTORY: Past Surgical History:  Procedure Laterality Date  . CARDIAC CATHETERIZATION  2007   Left main 100%, RCA 70% ostial, all grafts were patent, EF 40-45 percent, distal circumflex subtotal after graft insertion, medical therapy   . CARDIAC CATHETERIZATION N/A 10/14/2014   Procedure: Left Heart Cath and Cors/Grafts Angiography;  Surgeon: JLorretta Harp MD;  [facility];  Service: Cardiovascular;  Laterality: N/A;  . CHOLECYSTECTOMY  1980's  . CORONARY ARTERY BYPASS GRAFT  1994    LIMA-LAD, SVG-D1, SVG-OM,  SVG[place]RCA[place] . ESOPHAGOGASTRODUODENOSCOPY N/A 03/25/2013   Procedure: ESOPHAGOGASTRODUODENOSCOPY (EGD);  Surgeon: JMissy Sabins MD;  gastritis, cannot rule out duodenal ulcer   . INGUINAL HERNIA REPAIR Left   . TRANSURETHRAL RESECTION OF PROSTATE      SOCIAL HISTORY: Social History   Socioeconomic History  . Marital status: Married    Spouse name: Not on file  . Number of children: Not on file  . Years of education: Not on file  . Highest education level: Not on file  Occupational History  . Occupation: Retired  SScientific laboratory technician . Financial resource strain: Not on file  . Food insecurity:    Worry: Not on file    Inability: Not on file  . Transportation needs:    Medical: Not on file    Non-medical: Not on file  Tobacco Use  . Smoking status: Never Smoker  . Smokeless tobacco: Never Used  Substance and Sexual Activity  . Alcohol use: Yes    Alcohol/week: 4.2 oz    Types: 7 Glasses of wine per week    Comment: wine - daily - currently not drinking  . Drug use: No  . Sexual activity: Never    Birth control/protection: Abstinence  Lifestyle  . Physical activity:    Days per week: Not on file    Minutes per session: Not on file  . Stress: Not on file  Relationships  . Social connections:      Talks on phone: Not on file    Gets together: Not on file    Attends religious service: Not on file    Active member of club or organization: Not on file    Attends meetings of clubs or organizations: Not on file    Relationship status: Not on file  . Intimate partner violence:    Fear of current or ex partner: Not on file    Emotionally abused: Not on file    Physically abused: Not on file    Forced sexual activity: Not on file  Other Topics Concern  . Not on file  Social History Narrative   [facility].    FAMILY HISTORY: Family History  Problem Relation Age of Onset  . Hypertension Mother   . Heart disease Mother   . Heart disease Father   . Heart failure Brother   . Heart failure Sister   . Heart failure Brother   . Heart attack Neg Hx   . Stroke Neg Hx     ALLERGIES:  has No Known Allergies.  MEDICATIONS:  Current Outpatient Medications  Medication Sig Dispense Refill  . acetaminophen (TYLENOL) 325 MG tablet Take 2 tablets (650 mg total) by mouth every 4 (four) hours as needed for headache or mild pain.    [name] albuterol (PROAIR HFA) 108 (90 Base) MCG/ACT inhaler Inhale 2 puffs into the lungs every 6 (six) hours as needed for wheezing or shortness of breath.    [name] amLODipine (NORVASC) 5 MG tablet Take 1 tablet (5 mg total) by mouth daily. 90 tablet 0  . BREO ELLIPTA 100-25 MCG/INH AEPB 1 PUFF BY MOUTH DAILY  11  . buPROPion (WELLBUTRIN XL) 150 MG 24 hr tablet Take 150 mg by mouth daily.     [name] desvenlafaxine (PRISTIQ) 50 MG 24 hr tablet Take 25 mg by mouth daily.     [name] dexamethasone (DECADRON) 2 MG tablet Take 1 tablet (2 mg total) by mouth daily with breakfast. 30 tablet 0  . FERROCITE 324 MG TABS tablet TAKE 1 TABLET BY MOUTH QOD  11  . hydrocortisone (ANUSOL-HC) 25 MG suppository Place 1 suppository (25 mg total) rectally every 12 (twelve) hours. 14 suppository 1  . isosorbide mononitrate (IMDUR) 30 MG 24 hr tablet  Take 30 mg by mouth daily.    [name] LYRICA 50 MG capsule Take 50 mg by mouth daily as needed (pain).     . nitroGLYCERIN (NITROSTAT) 0.4 MG SL tablet Place 1 tablet (0.4 mg total) under the tongue every 5 (five) minutes x 3 doses as needed for chest pain. 25 tablet 4  . Omega-3 Fatty Acids (FISH OIL PO) Take 1 capsule by mouth daily.    . OXYGEN Inhale 2 L into the lungs continuous.    . pantoprazole (PROTONIX) 40 MG tablet Take 1 tablet (40 mg total) by mouth 2 (two) times daily before a meal. 60 tablet 3  . TAGRISSO 80 MG tablet TAKE 1 TABLET (80 MG TOTAL) BY MOUTH DAILY. 30 tablet 2  . vitamin B-12 (CYANOCOBALAMIN) 1000 MCG tablet Take 1,000 mcg by mouth every evening.      No current facility-administered medications for this visit.     REVIEW OF SYSTEMS:    10 Point review of Systems was done is negative except as noted above.   PHYSICAL EXAMINATION:  ECOG PERFORMANCE STATUS: 2 - Symptomatic, <50% confined to bed  .BP 140/74 (BP Location: Left Arm, Patient Position: Sitting)   Pulse 73   Temp 97.9 F (36.6 C) (Oral)   Resp 17   Ht '5\' 6"'$  (1.676 m)   Wt 148 lb 1.6 oz (67.2 kg)   SpO2 97%   BMI 23.90 kg/m   GENERAL:alert, somewhat fatigued appearing elderly gentleman SKIN: no acute rashes, no significant lesions EYES: conjunctiva are pink and non-injected, sclera anicteric OROPHARYNX: MMM, no exudates, no oropharyngeal erythema or ulceration NECK: supple, no JVD LYMPH:  no palpable lymphadenopathy in the cervical, axillary or inguinal regions LUNGS: Much improved left lung air entry with minimal decrease in lung sounds about 1/6 up posterior chest wall.   HEART: regular rate & rhythm ABDOMEN:  normoactive bowel sounds , non tender, not distended. Extremity: trace  pedal edema PSYCH: alert & oriented x 3 with fluent speech NEURO: no focal motor/sensory deficits     LABORATORY DATA:  I have reviewed the data as listed  . CBC Latest Ref Rng & Units 08/14/2017 08/01/2017  07/29/2017  WBC 4.0 - 10.3 K/uL 9.2 5.4 8.9  Hemoglobin 13.0 - 17.1 g/dL 12.2(L) - 12.1(L)  Hematocrit 38.4 - 49.9 % 38.3(L) 34.9(L) 35.4(L)  Platelets 140 - 400 K/uL 176 138(L) 174  HGB 12.2  . CMP Latest Ref Rng & Units 08/14/2017 08/01/2017 07/24/2017  Glucose 70 - 140 mg/dL 96 65(L) -  BUN 7 - 26 mg/dL 31(H) 26 30(A)  Creatinine 0.70 - 1.30 mg/dL 1.76(H) 1.69(H) 1.9(A)  Sodium 136 - 145 mmol/L 140 142 138  Potassium 3.5 - 5.1 mmol/L 4.7 4.5 4.8  Chloride 98 - 109 mmol/L 106 109 -  CO2 22 - 29 mmol/L 27 25 -  Calcium 8.4 - 10.4 mg/dL 9.3 9.3 -  Total Protein 6.4 - 8.3 g/dL 6.4 6.4 -  Total Bilirubin 0.2 - 1.2 mg/dL 0.4 0.3 -  Alkaline Phos 40 - 150 U/L 55 64 -  AST 5 - 34 U/L 15 17 -  ALT 0 - 55 U/L 17 13 -   Component     Latest Ref Rng & Units 08/14/2017  TSH     0.450 - 4.500 uIU/mL 2.530  Thyroxine (T4)     4.5 - 12.0 ug/dL 6.5  T3 Uptake Ratio     24 - 39 % 26  Free Thyroxine Index     1.2 - 4.9 1.7     RADIOGRAPHIC STUDIES: I have personally reviewed the radiological images as listed and agreed with the findings in the report. Dg Chest 1 View  Result Date: 08/08/2017 CLINICAL DATA:  Status post thoracentesis EXAM: CHEST  1 VIEW COMPARISON:  Chest radiograph June 26, 2017 and chest CT August 01, 2017 FINDINGS: Left pleural effusion is much smaller following thoracentesis. There is a small residual left pleural effusion with atelectatic change in the left base. Right lung is clear. Heart is borderline enlarged with pulmonary vascularity within normal limits. Patient is status post internal mammary bypass grafting. There is a sizable hiatal hernia. No adenopathy. No bone lesions. IMPRESSION: No pneumothorax. Small pleural effusion with left base atelectasis. Lungs elsewhere clear. Stable cardiac prominence. Sizable hiatal hernia. Electronically Signed   By: Lowella Grip III M.D.   On: 08/08/2017 11:01   Ct Chest Wo Contrast  Result Date: 08/01/2017 CLINICAL DATA:   Follow-up lung cancer. EXAM: CT CHEST WITHOUT CONTRAST TECHNIQUE: Multidetector CT imaging of the chest was performed following the standard protocol without IV contrast. COMPARISON:  06/26/2017 FINDINGS: Cardiovascular: Normal heart size. No pericardial effusion. Previous median sternotomy and CABG procedure. Aortic atherosclerosis noted. Mediastinum/Nodes: Low-density nodule within the inferior pole of right lobe of thyroid gland measures 1.5 cm. The trachea appears patent and is midline. Large hiatal hernia. No enlarged supraclavicular or axillary adenopathy. No enlarged mediastinal lymph nodes. Calcified left hilar nodes identified. Lungs/Pleura: Increase in volume of left pleural effusion. There is progressive volume loss within the left upper lobe. Central perihilar left upper lobe mass is identified with postobstructive atelectasis involving the basilar portion of the left upper lobe. This measures approximately 2.4 x 4.5 cm, image 75/series 2. Upper Abdomen: Low-attenuation nodule in right adrenal gland is identified measuring 1.7 cm, image 133/series 2. Unchanged from previous exam. Musculoskeletal: No aggressive lytic or sclerotic bone lesions identified. IMPRESSION: 1. Increase in  volume of left pleural effusion. 2. Central perihilar area of masslike architectural distortion is nonspecific but results and postobstructive, subsegmental atelectasis of the basilar portion of the left upper lobe with associated volume loss. Although findings may reflect post treatment changes underlying tumor cannot be excluded. Consider follow-up imaging with PET-CT. 3. Right lobe of thyroid gland nodule. Consider further evaluation with thyroid ultrasound. If patient is clinically hyperthyroid, consider nuclear medicine thyroid uptake and scan. 4.  Aortic Atherosclerosis (ICD10-I70.0). 5. Hiatal hernia. Electronically Signed   By: Kerby Moors M.D.   On: 08/01/2017 14:22   US Thoracentesis Asp Pleural Space W/img  Guide  Result Date: 08/08/2017 INDICATION: Patient with history of left lung adenocarcinoma with recurrent malignant left pleural effusion. Request made for therapeutic left thoracentesis. EXAM: ULTRASOUND GUIDED THERAPEUTIC LEFT THORACENTESIS MEDICATIONS: None COMPLICATIONS: None immediate. PROCEDURE: An ultrasound guided thoracentesis was thoroughly discussed with the patient and questions answered. The benefits, risks, alternatives and complications were also discussed. The patient understands and wishes to proceed with the procedure. Written consent was obtained. Ultrasound was performed to localize and mark an adequate pocket of fluid in the left chest. The area was then prepped and draped in the normal sterile fashion. 1% Lidocaine was used for local anesthesia. Under ultrasound guidance a 6 Fr Safe-T-Centesis catheter was introduced. Thoracentesis was performed. The catheter was removed and a dressing applied. FINDINGS: A total of approximately 1.5 liters of yellow fluid was removed. Due to patient coughing and some left neck discomfort only the above amount of fluid was removed today. IMPRESSION: Successful ultrasound guided therapeutic left thoracentesis yielding 1.5 liters of pleural fluid. Follow-up chest x-ray revealed no pneumothorax. Read by: Rowe Robert, PA-C Electronically Signed   By: Jacqulynn Cadet M.D.   On: 08/08/2017 12:00    ASSESSMENT & PLAN:   Scott [name] is a wonderful 84 y.o. male with  1) Metastatic Left lung Adenocarcinoma involving LUL lung mass left lung with associated moderated malignant left pleural effusion and left hilar LNadenopathy. EGFR mutated lung adenocarcinoma  2) DOE improved s/p recent therapeutic thoracentesis. 3) Grade 1-2 fatigue -- multifactorial Age+ significant burden of medical issues + SOE/DOE from pleural effusion + fatigue from medication. Much improved with dexamethasone and after recent therapeutic thoracentesis. PLAN  --Discussed pt  labwork today; blood counts and chemistries are stable.  -Discussed with the pt that we will continue to monitor the fluid collection in his chest with any indicative symptoms including SOB and fatigue.  -Discussed that as the pt's appetite continues to increase, he can begin to taper his dexamethasone to every other days in the next 1-2 weeks and then stop if eating well.  - tolerating Tagrisso without any prohibitive toxicities except grade 1-2 fatigue. -continue Tagrisso at current dose. -Thyroid profile with next labs in 2 weeks -Encouraged pt to continue with PT at his facility -We will see pt back in 6 weeks.  -Reminded pt of our symptoms management clinic if he develops any limiting or acute symptoms.   3) Grade 1-2 Fatigue Component     Latest Ref Rng & Units 08/14/2017  TSH     0.450 - 4.500 uIU/mL 2.530  Thyroxine (T4)     4.5 - 12.0 ug/dL 6.5  T3 Uptake Ratio     24 - 39 % 26  Free Thyroxine Index     1.2 - 4.9 1.7   -thyroid function tests WNL -optimizing nutrition -maintain active lifestyle.  4) Decreased appetite -Advised with the patient, his  wife, and his daughter that the patient could use boost or ensure as a nutritional supplement.  -dexamethasone appearing to be helping.   RTC with Dr Irene Limbo in 5 weeks with labs    All of the patients questions were answered with apparent satisfaction. The patient knows to call the clinic with any [id number], questions or concerns.  . The total time spent in the appointment was 25 minutes and more than 50% was on counseling and direct patient cares.     GSullivan LoneMD MTyroneAAHIVMS SAnne Arundel Digestive CenterCAdirondack Medical CenterHematology/Oncology Physician [facility]ur[facility] (Office):       [phone](Work cell):  [phone](Fax):           [phone] This document serves as a record of services personally performed by GSullivan Lone MD. It was created on his behalf by SBaldwin Jamaica a trained medical scribe. The creation of this record is  based on the scribe's personal observations and the provider's statements to them.   .I have reviewed the above documentation for accuracy and completeness, and I agree with the above. .Brunetta GeneraMD MS

## 2017-08-14 ENCOUNTER — Inpatient Hospital Stay: Payer: Medicare Other

## 2017-08-14 ENCOUNTER — Telehealth: Payer: Self-pay | Admitting: Hematology

## 2017-08-14 ENCOUNTER — Inpatient Hospital Stay (HOSPITAL_BASED_OUTPATIENT_CLINIC_OR_DEPARTMENT_OTHER): Payer: Medicare Other | Admitting: Hematology

## 2017-08-14 VITALS — BP 140/74 | HR 73 | Temp 97.9°F | Resp 17 | Ht 66.0 in | Wt 148.1 lb

## 2017-08-14 DIAGNOSIS — C3412 Malignant neoplasm of upper lobe, left bronchus or lung: Secondary | ICD-10-CM

## 2017-08-14 DIAGNOSIS — I5032 Chronic diastolic (congestive) heart failure: Secondary | ICD-10-CM | POA: Diagnosis not present

## 2017-08-14 DIAGNOSIS — R5383 Other fatigue: Secondary | ICD-10-CM

## 2017-08-14 DIAGNOSIS — K219 Gastro-esophageal reflux disease without esophagitis: Secondary | ICD-10-CM

## 2017-08-14 DIAGNOSIS — I2510 Atherosclerotic heart disease of native coronary artery without angina pectoris: Secondary | ICD-10-CM

## 2017-08-14 DIAGNOSIS — E785 Hyperlipidemia, unspecified: Secondary | ICD-10-CM | POA: Diagnosis not present

## 2017-08-14 DIAGNOSIS — F418 Other specified anxiety disorders: Secondary | ICD-10-CM

## 2017-08-14 DIAGNOSIS — Z951 Presence of aortocoronary bypass graft: Secondary | ICD-10-CM | POA: Diagnosis not present

## 2017-08-14 DIAGNOSIS — I252 Old myocardial infarction: Secondary | ICD-10-CM | POA: Diagnosis not present

## 2017-08-14 DIAGNOSIS — R630 Anorexia: Secondary | ICD-10-CM | POA: Diagnosis not present

## 2017-08-14 DIAGNOSIS — Z9981 Dependence on supplemental oxygen: Secondary | ICD-10-CM

## 2017-08-14 DIAGNOSIS — J910 Malignant pleural effusion: Secondary | ICD-10-CM

## 2017-08-14 DIAGNOSIS — Z79899 Other long term (current) drug therapy: Secondary | ICD-10-CM

## 2017-08-14 DIAGNOSIS — N183 Chronic kidney disease, stage 3 (moderate): Secondary | ICD-10-CM | POA: Diagnosis not present

## 2017-08-14 DIAGNOSIS — I130 Hypertensive heart and chronic kidney disease with heart failure and stage 1 through stage 4 chronic kidney disease, or unspecified chronic kidney disease: Secondary | ICD-10-CM | POA: Diagnosis not present

## 2017-08-14 LAB — CBC WITH DIFFERENTIAL/PLATELET
BASOS ABS: 0.1 10*3/uL (ref 0.0–0.1)
Basophils Relative: 1 %
Eosinophils Absolute: 0.1 10*3/uL (ref 0.0–0.5)
Eosinophils Relative: 1 %
HEMATOCRIT: 38.3 % — AB (ref 38.4–49.9)
Hemoglobin: 12.2 g/dL — ABNORMAL LOW (ref 13.0–17.1)
LYMPHS PCT: 10 %
Lymphs Abs: 0.9 10*3/uL (ref 0.9–3.3)
MCH: 27.9 pg (ref 27.2–33.4)
MCHC: 31.8 g/dL — ABNORMAL LOW (ref 32.0–36.0)
MCV: 87.7 fL (ref 79.3–98.0)
Monocytes Absolute: 0.5 10*3/uL (ref 0.1–0.9)
Monocytes Relative: 6 %
NEUTROS ABS: 7.6 10*3/uL — AB (ref 1.5–6.5)
NEUTROS PCT: 82 %
Platelets: 176 10*3/uL (ref 140–400)
RBC: 4.36 MIL/uL (ref 4.20–5.82)
RDW: 15.8 % — ABNORMAL HIGH (ref 11.0–14.6)
WBC: 9.2 10*3/uL (ref 4.0–10.3)

## 2017-08-14 LAB — CMP (CANCER CENTER ONLY)
ALBUMIN: 3.2 g/dL — AB (ref 3.5–5.0)
ALT: 17 U/L (ref 0–55)
ANION GAP: 7 (ref 3–11)
AST: 15 U/L (ref 5–34)
Alkaline Phosphatase: 55 U/L (ref 40–150)
BILIRUBIN TOTAL: 0.4 mg/dL (ref 0.2–1.2)
BUN: 31 mg/dL — ABNORMAL HIGH (ref 7–26)
CALCIUM: 9.3 mg/dL (ref 8.4–10.4)
CO2: 27 mmol/L (ref 22–29)
Chloride: 106 mmol/L (ref 98–109)
Creatinine: 1.76 mg/dL — ABNORMAL HIGH (ref 0.70–1.30)
GFR, EST AFRICAN AMERICAN: 37 mL/min — AB (ref >60)
GFR, EST NON AFRICAN AMERICAN: 32 mL/min — AB (ref >60)
Glucose, Bld: 96 mg/dL (ref 70–140)
POTASSIUM: 4.7 mmol/L (ref 3.5–5.1)
Sodium: 140 mmol/L (ref 136–145)
TOTAL PROTEIN: 6.4 g/dL (ref 6.4–8.3)

## 2017-08-14 LAB — RETICULOCYTES
RBC.: 4.27 MIL/uL (ref 4.22–5.81)
RETIC COUNT ABSOLUTE: 72.6 10*3/uL (ref 19.0–186.0)
Retic Ct Pct: 1.7 % (ref 0.4–3.1)

## 2017-08-14 NOTE — Telephone Encounter (Signed)
Scheduled appt per 3/28 los - Gave patient AVS

## 2017-08-15 ENCOUNTER — Ambulatory Visit: Payer: Medicare Other | Admitting: Hematology

## 2017-08-15 ENCOUNTER — Other Ambulatory Visit: Payer: Medicare Other

## 2017-08-15 DIAGNOSIS — R2689 Other abnormalities of gait and mobility: Secondary | ICD-10-CM | POA: Diagnosis not present

## 2017-08-15 DIAGNOSIS — M6281 Muscle weakness (generalized): Secondary | ICD-10-CM | POA: Diagnosis not present

## 2017-08-15 DIAGNOSIS — F418 Other specified anxiety disorders: Secondary | ICD-10-CM | POA: Diagnosis not present

## 2017-08-15 DIAGNOSIS — R2681 Unsteadiness on feet: Secondary | ICD-10-CM | POA: Diagnosis not present

## 2017-08-15 DIAGNOSIS — D509 Iron deficiency anemia, unspecified: Secondary | ICD-10-CM | POA: Diagnosis not present

## 2017-08-15 DIAGNOSIS — Z9181 History of falling: Secondary | ICD-10-CM | POA: Diagnosis not present

## 2017-08-15 LAB — THYROID PANEL WITH TSH
FREE THYROXINE INDEX: 1.7 (ref 1.2–4.9)
T3 Uptake Ratio: 26 % (ref 24–39)
T4, Total: 6.5 ug/dL (ref 4.5–12.0)
TSH: 2.53 u[IU]/mL (ref 0.450–4.500)

## 2017-08-20 DIAGNOSIS — M6281 Muscle weakness (generalized): Secondary | ICD-10-CM | POA: Diagnosis not present

## 2017-08-20 DIAGNOSIS — Z86010 Personal history of colonic polyps: Secondary | ICD-10-CM | POA: Diagnosis not present

## 2017-08-20 DIAGNOSIS — I2510 Atherosclerotic heart disease of native coronary artery without angina pectoris: Secondary | ICD-10-CM | POA: Diagnosis not present

## 2017-08-20 DIAGNOSIS — D509 Iron deficiency anemia, unspecified: Secondary | ICD-10-CM | POA: Diagnosis not present

## 2017-08-20 DIAGNOSIS — Z9181 History of falling: Secondary | ICD-10-CM | POA: Diagnosis not present

## 2017-08-20 DIAGNOSIS — F418 Other specified anxiety disorders: Secondary | ICD-10-CM | POA: Diagnosis not present

## 2017-08-20 DIAGNOSIS — R55 Syncope and collapse: Secondary | ICD-10-CM | POA: Diagnosis not present

## 2017-08-20 DIAGNOSIS — R2681 Unsteadiness on feet: Secondary | ICD-10-CM | POA: Diagnosis not present

## 2017-08-20 DIAGNOSIS — E611 Iron deficiency: Secondary | ICD-10-CM | POA: Diagnosis not present

## 2017-08-20 DIAGNOSIS — K5732 Diverticulitis of large intestine without perforation or abscess without bleeding: Secondary | ICD-10-CM | POA: Diagnosis not present

## 2017-08-20 DIAGNOSIS — R0600 Dyspnea, unspecified: Secondary | ICD-10-CM | POA: Diagnosis not present

## 2017-08-20 DIAGNOSIS — B029 Zoster without complications: Secondary | ICD-10-CM | POA: Diagnosis not present

## 2017-08-20 DIAGNOSIS — R2689 Other abnormalities of gait and mobility: Secondary | ICD-10-CM | POA: Diagnosis not present

## 2017-08-20 DIAGNOSIS — N400 Enlarged prostate without lower urinary tract symptoms: Secondary | ICD-10-CM | POA: Diagnosis not present

## 2017-08-25 MED FILL — TAGRISSO 80 MG TABLET: 80 | 30 days supply | Qty: 30 | Fill #1

## 2017-09-01 ENCOUNTER — Other Ambulatory Visit: Payer: Self-pay | Admitting: Pharmacist

## 2017-09-02 ENCOUNTER — Telehealth: Payer: Self-pay | Admitting: *Deleted

## 2017-09-02 ENCOUNTER — Other Ambulatory Visit: Payer: Self-pay | Admitting: Hematology

## 2017-09-02 NOTE — Telephone Encounter (Signed)
Received voice mail message from wife stating,"Scott [name]'s chest bothers him when he gets up and stirs around. I'm worried because he has had chest tubes in the past and they needed to be drained. He doesn't see Dr. Irene Limbo until May 3rd and I think he needs to be seen sooner than that. My return number is [phone]."  I tried returning the wife's call but unable to get her. Left a message for her to call [place] at [phone].

## 2017-09-03 ENCOUNTER — Telehealth: Payer: Self-pay

## 2017-09-03 ENCOUNTER — Inpatient Hospital Stay: Payer: Medicare Other | Attending: Hematology | Admitting: Medical

## 2017-09-03 ENCOUNTER — Telehealth: Payer: Self-pay | Admitting: Emergency Medicine

## 2017-09-03 ENCOUNTER — Inpatient Hospital Stay: Payer: Medicare Other | Admitting: Medical

## 2017-09-03 ENCOUNTER — Ambulatory Visit (HOSPITAL_COMMUNITY)
Admission: RE | Admit: 2017-09-03 | Discharge: 2017-09-03 | Disposition: A | Discharge status: Home / Self Care | Payer: Medicare Other | Source: Ambulatory Visit | Attending: Medical | Admitting: Medical

## 2017-09-03 ENCOUNTER — Other Ambulatory Visit: Payer: Self-pay | Admitting: Medical

## 2017-09-03 ENCOUNTER — Telehealth: Payer: Self-pay | Admitting: Hematology

## 2017-09-03 VITALS — BP 130/62 | HR 89 | Temp 97.6°F | Resp 18 | Ht 66.0 in | Wt 152.4 lb

## 2017-09-03 DIAGNOSIS — N183 Chronic kidney disease, stage 3 (moderate): Secondary | ICD-10-CM | POA: Diagnosis not present

## 2017-09-03 DIAGNOSIS — I2510 Atherosclerotic heart disease of native coronary artery without angina pectoris: Secondary | ICD-10-CM | POA: Diagnosis not present

## 2017-09-03 DIAGNOSIS — I5032 Chronic diastolic (congestive) heart failure: Secondary | ICD-10-CM | POA: Insufficient documentation

## 2017-09-03 DIAGNOSIS — D3501 Benign neoplasm of right adrenal gland: Secondary | ICD-10-CM | POA: Diagnosis not present

## 2017-09-03 DIAGNOSIS — K219 Gastro-esophageal reflux disease without esophagitis: Secondary | ICD-10-CM | POA: Insufficient documentation

## 2017-09-03 DIAGNOSIS — Z951 Presence of aortocoronary bypass graft: Secondary | ICD-10-CM | POA: Diagnosis not present

## 2017-09-03 DIAGNOSIS — R0602 Shortness of breath: Secondary | ICD-10-CM | POA: Insufficient documentation

## 2017-09-03 DIAGNOSIS — I130 Hypertensive heart and chronic kidney disease with heart failure and stage 1 through stage 4 chronic kidney disease, or unspecified chronic kidney disease: Secondary | ICD-10-CM | POA: Diagnosis not present

## 2017-09-03 DIAGNOSIS — Z79899 Other long term (current) drug therapy: Secondary | ICD-10-CM | POA: Insufficient documentation

## 2017-09-03 DIAGNOSIS — R630 Anorexia: Secondary | ICD-10-CM | POA: Insufficient documentation

## 2017-09-03 DIAGNOSIS — C3412 Malignant neoplasm of upper lobe, left bronchus or lung: Secondary | ICD-10-CM | POA: Diagnosis not present

## 2017-09-03 DIAGNOSIS — K449 Diaphragmatic hernia without obstruction or gangrene: Secondary | ICD-10-CM | POA: Insufficient documentation

## 2017-09-03 DIAGNOSIS — J90 Pleural effusion, not elsewhere classified: Secondary | ICD-10-CM

## 2017-09-03 DIAGNOSIS — R5383 Other fatigue: Secondary | ICD-10-CM | POA: Diagnosis not present

## 2017-09-03 DIAGNOSIS — E785 Hyperlipidemia, unspecified: Secondary | ICD-10-CM | POA: Diagnosis not present

## 2017-09-03 DIAGNOSIS — I252 Old myocardial infarction: Secondary | ICD-10-CM | POA: Diagnosis not present

## 2017-09-03 DIAGNOSIS — Z9981 Dependence on supplemental oxygen: Secondary | ICD-10-CM | POA: Diagnosis not present

## 2017-09-03 DIAGNOSIS — J910 Malignant pleural effusion: Secondary | ICD-10-CM

## 2017-09-03 LAB — CBC WITH DIFFERENTIAL (CANCER CENTER ONLY)
Basophils Absolute: 0 10*3/uL (ref 0.0–0.1)
Basophils Relative: 0 %
Eosinophils Absolute: 0.3 10*3/uL (ref 0.0–0.5)
Eosinophils Relative: 4 %
HEMATOCRIT: 41 % (ref 38.4–49.9)
Hemoglobin: 12.8 g/dL — ABNORMAL LOW (ref 13.0–17.1)
LYMPHS ABS: 0.9 10*3/uL (ref 0.9–3.3)
LYMPHS PCT: 12 %
MCH: 28.2 pg (ref 27.2–33.4)
MCHC: 31.2 g/dL — AB (ref 32.0–36.0)
MCV: 90.3 fL (ref 79.3–98.0)
MONOS PCT: 10 %
Monocytes Absolute: 0.8 10*3/uL (ref 0.1–0.9)
NEUTROS ABS: 5.7 10*3/uL (ref 1.5–6.5)
Neutrophils Relative %: 74 %
Platelet Count: 126 10*3/uL — ABNORMAL LOW (ref 140–400)
RBC: 4.54 MIL/uL (ref 4.20–5.82)
RDW: 15.1 % — AB (ref 11.0–14.6)
WBC Count: 7.7 10*3/uL (ref 4.0–10.3)

## 2017-09-03 LAB — CMP (CANCER CENTER ONLY)
ALBUMIN: 3.2 g/dL — AB (ref 3.5–5.0)
ALK PHOS: 57 U/L (ref 40–150)
ALT: 17 U/L (ref 0–55)
AST: 17 U/L (ref 5–34)
Anion gap: 5 (ref 3–11)
BUN: 29 mg/dL — ABNORMAL HIGH (ref 7–26)
CO2: 27 mmol/L (ref 22–29)
Calcium: 9.2 mg/dL (ref 8.4–10.4)
Chloride: 108 mmol/L (ref 98–109)
Creatinine: 1.74 mg/dL — ABNORMAL HIGH (ref 0.70–1.30)
GFR, Est AFR Am: 38 mL/min — ABNORMAL LOW (ref >60)
GFR, Estimated: 33 mL/min — ABNORMAL LOW (ref >60)
GLUCOSE: 100 mg/dL (ref 70–140)
POTASSIUM: 4.4 mmol/L (ref 3.5–5.1)
SODIUM: 140 mmol/L (ref 136–145)
Total Bilirubin: 0.3 mg/dL (ref 0.2–1.2)
Total Protein: 6.3 g/dL — ABNORMAL LOW (ref 6.4–8.3)

## 2017-09-03 NOTE — Telephone Encounter (Signed)
Called on 4/16 and MX transferred to Triage. Patient recalled today and transferred to triage they was trying to speak to wife. She verified that they were missing each other call. Per 4/17 phone que

## 2017-09-03 NOTE — Patient Instructions (Signed)
Shortness of Breath, Adult  Shortness of breath means you have trouble breathing. Your lungs are organs for breathing.  Follow these instructions at home:  Pay attention to any changes in your symptoms. Take these actions to help with your condition:  ? Do not smoke. Smoking can cause shortness of breath. If you need help to quit smoking, ask your doctor.  ? Avoid things that can make it harder to breathe, such as:  ? Mold.  ? Dust.  ? Air pollution.  ? Chemical smells.  ? Things that can cause allergy symptoms (allergens), if you have allergies.  ? Keep your living space clean and free of mold and dust.  ? Rest as needed. Slowly return to your usual activities.  ? Take over-the-counter and prescription medicines, including oxygen and inhaled medicines, only as told by your doctor.  ? Keep all follow-up visits as told by your doctor. This is important.  Contact a doctor if:  ? Your condition does not get better as soon as expected.  ? You have a hard time doing your normal activities, even after you rest.  ? You have new symptoms.  Get help right away if:  ? You have trouble breathing when you are resting.  ? You feel light-headed or you faint.  ? You have a cough that is not helped by medicines.  ? You cough up blood.  ? You have pain with breathing.  ? You have pain in your chest, arms, shoulders, or belly (abdomen).  ? You have a fever.  ? You cannot walk up stairs.  ? You cannot exercise the way you normally do.  This information is not intended to replace advice given to you by your health care provider. Make sure you discuss any questions you have with your health care provider.  Document Released: 10/23/2007 Document Revised: 05/23/2016 Document Reviewed: 05/23/2016  Elsevier Interactive Patient Education ? 2017 [employer]

## 2017-09-03 NOTE — Progress Notes (Signed)
These preliminary result these preliminary results were noted.  Awaiting final report.

## 2017-09-03 NOTE — Telephone Encounter (Signed)
TCT patient returning voicemail.  Pt's wife reports increased SOB with movement for her husband and gen chest discomfort.  Denies any acute chest pain or weakness.  Pt has a hx of chest tube insertion and having fluid removed from his lungs.  Pt will see PA Lucianne Lei this afternoon around 1330-1400 for CXR, possible labs, and [facility] visit.  Pt's wife verbalized understanding.

## 2017-09-03 NOTE — Telephone Encounter (Signed)
See other telephone note.  

## 2017-09-03 NOTE — Telephone Encounter (Signed)
Moved appt up per patient request - patient also had concerns about his breathing. Encouraged him to come to symptom management clinic

## 2017-09-04 ENCOUNTER — Telehealth: Payer: Self-pay | Admitting: Hematology

## 2017-09-04 NOTE — Telephone Encounter (Signed)
Tried to call regarding voicemail  °

## 2017-09-05 ENCOUNTER — Telehealth: Payer: Self-pay | Admitting: Hematology

## 2017-09-05 NOTE — Progress Notes (Signed)
[facility] Progress Note   Scott [name] [id number] 04/13/1924 84 y.o.  [name] is managed by Scott [name]  Actively treated with chemotherapy: yes  Current Therapy: Tagrisso  Last Treated: 04 / 17 / 2019  Assessment: Plan:    SOB (shortness of breath) - Plan: CBC with Differential ([place] Only), CMP ([place] only)  Recurrent left pleural effusion  Primary adenocarcinoma of upper lobe of left lung (HCC)   Shortness of breath: A CBC, chemistry panel, and chest x-ray with left lateral decub was ordered today.  The chest x-ray and left lateral decub returned showing a large left pleural effusion that layers freely.  The patient was referred for a thoracentesis.  His labs returned with no acute findings other than his platelet count being slightly lower at 126 which is down from 176 at the time of the patient's last visit.  Recurrent left pleural effusion: The patient has been referred for a therapeutic thoracentesis.  Primary metastatic adenocarcinoma of upper left lung: The patient continues to be followed by Scott [name] and continues on oral Tagrisso.  He is scheduled to be seen in follow-up by Scott [name] on 09/15/2017.  Please see After Visit Summary for patient specific instructions.  Future Appointments  Date Time Provider [place]  09/15/2017  9:15 AM [facility] None  09/15/2017  9:40 AM Brunetta Genera, MD [facility] None  09/30/2017 11:20 AM Scott Booze, MD [facility]  10/16/2017  2:30 PM Mast, [name], NP [facility] None    Orders Placed This Encounter  Procedures  . CBC with Differential ([place] Only)  . CMP ([place] only)       Subjective:   Patient ID:  ENGLISH [name] is a 84 y.o. (DOB [birth year]) male.  Chief Complaint:  Chief Complaint  Patient presents with  . Shortness of Breath    HPI DRAKEN FARRIOR is a 84 year old male  with a history of a metastatic EGFR positive metastatic lung adenocarcinoma.  The patient has a history of a recurrent left pleural effusion and is had 3 therapeutic thoracentesis thus far with his last completed on 08/08/2017 with 1.5 L of fluid removed.  He is followed by Scott [name] and continues on [place] daily.  He has had increasing shortness of breath and has had pain along his left chest wall when lying on his left side.  He shortness his breath is increased with lying on his left side. He denies fevers, chills, sweats, or a productive cough.  Medications: I have reviewed the patient's current medications.  Allergies: No Known Allergies  Past Medical History:  Diagnosis Date  . Anxiety   . Bradycardia    a. BB d/c'd in 2016.  [name] CAD (coronary artery disease)    a. 1983 s/p CABG [facility] [place]);  b. 2007 NSTEMI->med Rx for distal LCX dzs; c. 09/2014 NSTEMI/Cath: LIMA->LAD & diag ok, native RCA dominant and patent.   VG->RCA 100, VG->LCX 100 -->Med Rx.  . CKD (chronic kidney disease), stage III ([place])   . Claudication ([place])    BLE  . Depression   . Diastolic dysfunction    a. 10/2014 Echo: F 60-65%, Gr1 DD, mild AS, mild AI, mild to mod MR, PASP 61mHg.  .Marland KitchenDyspnea 2010   syndrome - extensive  . Erectile dysfunction   . GERD (gastroesophageal reflux disease)   . H/O hiatal hernia   . History of blood transfusion  2014  . Hx of adenomatous colonic polyps 2007  . Hyperlipidemia   . Hypertensive heart disease   . Inguinal hernia   . Iron deficiency anemia   . Shingles 1990's  . Sigmoid diverticulosis   . Upper GI bleeding 2014   Gastritis and possible duodenal ulcer seen on EGD, felt secondary to NSAIDs  . Urethral stricture 11/2008  . Vasovagal syncope 05/2006  . Weight loss     Past Surgical History:  Procedure Laterality Date  . CARDIAC CATHETERIZATION  2007   Left main 100%, RCA 70% ostial, all grafts were patent, EF 40-45 percent, distal circumflex subtotal after graft  insertion, medical therapy   . CARDIAC CATHETERIZATION N/A 10/14/2014   Procedure: Left Heart Cath and Cors/Grafts Angiography;  Surgeon: Lorretta Harp, MD;  Location: [facility];  Service: Cardiovascular;  Laterality: N/A;  . CHOLECYSTECTOMY  1980's  . CORONARY ARTERY BYPASS GRAFT  1994    LIMA-LAD, SVG-D1, SVG-OM,  SVG-RCA; performed in [place], [place]  . ESOPHAGOGASTRODUODENOSCOPY N/A 03/25/2013   Procedure: ESOPHAGOGASTRODUODENOSCOPY (EGD);  Surgeon: Missy Sabins, MD;  gastritis, cannot rule out duodenal ulcer   . INGUINAL HERNIA REPAIR Left   . TRANSURETHRAL RESECTION OF PROSTATE      Family History  Problem Relation Age of Onset  . Hypertension Mother   . Heart disease Mother   . Heart disease Father   . Heart failure Brother   . Heart failure Sister   . Heart failure Brother   . Heart attack Neg Hx   . Stroke Neg Hx     Social History   Socioeconomic History  . Marital status: Married    Spouse name: Not on file  . Number of children: Not on file  . Years of education: Not on file  . Highest education level: Not on file  Occupational History  . Occupation: Retired  Scientific laboratory technician  . Financial resource strain: Not on file  . Food insecurity:    Worry: Not on file    Inability: Not on file  . Transportation needs:    Medical: Not on file    Non-medical: Not on file  Tobacco Use  . Smoking status: Never Smoker  . Smokeless tobacco: Never Used  Substance and Sexual Activity  . Alcohol use: Yes    Alcohol/week: 4.2 oz    Types: 7 Glasses of wine per week    Comment: wine - daily - currently not drinking  . Drug use: No  . Sexual activity: Never    Birth control/protection: Abstinence  Lifestyle  . Physical activity:    Days per week: Not on file    Minutes per session: Not on file  . Stress: Not on file  Relationships  . Social connections:    Talks on phone: Not on file    Gets together: Not on file    Attends religious service: Not on  file    Active member of club or organization: Not on file    Attends meetings of clubs or organizations: Not on file    Relationship status: Not on file  . Intimate partner violence:    Fear of current or ex partner: Not on file    Emotionally abused: Not on file    Physically abused: Not on file    Forced sexual activity: Not on file  Other Topics Concern  . Not on file  Social History Narrative   He lives in [facility], independent living, with his  wife.    Past Medical History, Surgical history, Social history, and Family history were reviewed and updated as appropriate.   Please see review of systems for further details on the patient's review from today.   Review of Systems:  Review of Systems  Constitutional: Negative for chills and diaphoresis.  HENT: Negative for congestion, facial swelling and trouble swallowing.   Respiratory: Positive for shortness of breath. Negative for cough, choking, chest tightness and wheezing.   Cardiovascular: Positive for chest pain. Negative for palpitations.  Gastrointestinal: Negative for nausea and vomiting.  Musculoskeletal: Negative for back pain.  Skin: Negative for rash.  Neurological: Negative for dizziness, speech difficulty and headaches.  Psychiatric/Behavioral: The patient is not nervous/anxious.     Objective:   Physical Exam:  BP 130/62 (Patient Position: Sitting)   Pulse 89   Temp 97.6 F (36.4 C) (Oral)   Resp 18   Ht 5' 6"  (1.676 m)   Wt 152 lb 6.4 oz (69.1 kg)   SpO2 99%   BMI 24.60 kg/m   ECOG: 1  Oxygen saturation with ambulation: 98% on room air  Physical Exam  Constitutional: No distress.  HENT:  Head: Normocephalic and atraumatic.  Eyes: Right eye exhibits no discharge. Left eye exhibits no discharge. No scleral icterus.  Cardiovascular: Normal rate, regular rhythm and normal heart sounds. Exam reveals no gallop and no friction rub.  No murmur heard. Pulmonary/Chest:      Neurological: He is  alert.  Skin: Skin is warm and dry. No rash noted. He is not diaphoretic. No erythema.  Psychiatric: He has a normal mood and affect. His behavior is normal. Judgment and thought content normal.    Lab Review:     Component Value Date/Time   NA 140 09/03/2017 1423   NA 138 07/24/2017   NA 140 05/23/2017 0948   K 4.4 09/03/2017 1423   K 5.0 05/23/2017 0948   CL 108 09/03/2017 1423   CL 111 07/23/2017   CO2 27 09/03/2017 1423   CO2 24 07/23/2017   CO2 24 05/23/2017 0948   GLUCOSE 100 09/03/2017 1423   GLUCOSE 81 05/23/2017 0948   BUN 29 (H) 09/03/2017 1423   BUN 30 (A) 07/24/2017   BUN 41.7 (H) 05/23/2017 0948   CREATININE 1.74 (H) 09/03/2017 1423   CREATININE 2.5 (H) 05/23/2017 0948   CALCIUM 9.2 09/03/2017 1423   CALCIUM 8.5 07/23/2017   CALCIUM 9.2 05/23/2017 0948   PROT 6.3 (L) 09/03/2017 1423   PROT 5.7 07/22/2017   PROT 6.9 05/23/2017 0948   ALBUMIN 3.2 (L) 09/03/2017 1423   ALBUMIN 3.6 07/22/2017   ALBUMIN 3.6 05/23/2017 0948   AST 17 09/03/2017 1423   AST 15 05/23/2017 0948   ALT 17 09/03/2017 1423   ALT 16 05/23/2017 0948   ALKPHOS 57 09/03/2017 1423   ALKPHOS 67 05/23/2017 0948   BILITOT 0.3 09/03/2017 1423   BILITOT 0.41 05/23/2017 0948   GFRNONAA 33 (L) 09/03/2017 1423   GFRNONAA 24 07/22/2017   GFRAA 38 (L) 09/03/2017 1423       Component Value Date/Time   WBC 7.7 09/03/2017 1423   WBC 9.2 08/14/2017 1434   RBC 4.54 09/03/2017 1423   HGB 12.2 (L) 08/14/2017 1434   HGB 13.5 05/23/2017 0948   HCT 41.0 09/03/2017 1423   HCT 41.9 05/23/2017 0948   PLT 126 (L) 09/03/2017 1423   PLT 122 (L) 05/23/2017 0948   MCV 90.3 09/03/2017 1423   MCV 87.4 05/23/2017 0948  MCH 28.2 09/03/2017 1423   MCHC 31.2 (L) 09/03/2017 1423   RDW 15.1 (H) 09/03/2017 1423   RDW 14.9 (H) 05/23/2017 0948   LYMPHSABS 0.9 09/03/2017 1423   LYMPHSABS 1.0 05/23/2017 0948   MONOABS 0.8 09/03/2017 1423   MONOABS 0.7 05/23/2017 0948   EOSABS 0.3 09/03/2017 1423   EOSABS 0.4  05/23/2017 0948   BASOSABS 0.0 09/03/2017 1423   BASOSABS 0.1 05/23/2017 0948   -------------------------------  Imaging from last 24 hours (if applicable):  Radiology interpretation: Dg Chest 1 View  Result Date: 08/08/2017 CLINICAL DATA:  Status post thoracentesis EXAM: CHEST  1 VIEW COMPARISON:  Chest radiograph June 26, 2017 and chest CT August 01, 2017 FINDINGS: Left pleural effusion is much smaller following thoracentesis. There is a small residual left pleural effusion with atelectatic change in the left base. Right lung is clear. Heart is borderline enlarged with pulmonary vascularity within normal limits. Patient is status post internal mammary bypass grafting. There is a sizable hiatal hernia. No adenopathy. No bone lesions. IMPRESSION: No pneumothorax. Small pleural effusion with left base atelectasis. Lungs elsewhere clear. Stable cardiac prominence. Sizable hiatal hernia. Electronically Signed   By: Lowella Grip III M.D.   On: 08/08/2017 11:01   Dg Chest Left Decubitus  Result Date: 09/03/2017 CLINICAL DATA:  Worsening shortness of breath. Recurrent pleural effusion. EXAM: CHEST - LEFT DECUBITUS COMPARISON:  08/08/2017 FINDINGS: Left-side-down decubitus films show layering of a large left effusion. No effusion visible on the right. IMPRESSION: Large left effusion layers freely. Electronically Signed   By: Nelson Chimes M.D.   On: 09/03/2017 15:17   US Thoracentesis Asp Pleural Space W/img Guide  Result Date: 08/08/2017 INDICATION: Patient with history of left lung adenocarcinoma with recurrent malignant left pleural effusion. Request made for therapeutic left thoracentesis. EXAM: ULTRASOUND GUIDED THERAPEUTIC LEFT THORACENTESIS MEDICATIONS: None COMPLICATIONS: None immediate. PROCEDURE: An ultrasound guided thoracentesis was thoroughly discussed with the patient and questions answered. The benefits, risks, alternatives and complications were also discussed. The patient  understands and wishes to proceed with the procedure. Written consent was obtained. Ultrasound was performed to localize and mark an adequate pocket of fluid in the left chest. The area was then prepped and draped in the normal sterile fashion. 1% Lidocaine was used for local anesthesia. Under ultrasound guidance a 6 Fr Safe-T-Centesis catheter was introduced. Thoracentesis was performed. The catheter was removed and a dressing applied. FINDINGS: A total of approximately 1.5 liters of yellow fluid was removed. Due to patient coughing and some left neck discomfort only the above amount of fluid was removed today. IMPRESSION: Successful ultrasound guided therapeutic left thoracentesis yielding 1.5 liters of pleural fluid. Follow-up chest x-ray revealed no pneumothorax. Read by: Rowe Robert, PA-C Electronically Signed   By: Jacqulynn Cadet M.D.   On: 08/08/2017 12:00

## 2017-09-05 NOTE — Telephone Encounter (Signed)
Patient called to verify appointment time

## 2017-09-08 ENCOUNTER — Other Ambulatory Visit: Payer: Self-pay | Admitting: Nurse Practitioner

## 2017-09-14 NOTE — Progress Notes (Signed)
HEMATOLOGY/ONCOLOGY CLINIC NOTE  Date of Service: 09/15/17  Patient Care Team: [name], MD as PCP - General (Internal Medicine) Mast, [name], NP as Nurse Practitioner (Internal Medicine)  CHIEF COMPLAINTS/PURPOSE OF CONSULTATION:   F/u for EGFR mutated metastatic lung adenocarcinoma  HISTORY OF PRESENTING ILLNESS:   Scott [name] is a wonderful 84 y.o. male who has been referred to [place] by Dr. [name], MD for evaluation of suspected lung cancer.   He has a hx of CAD, CKD, Diastolic CHF, HTN, HLD. He presented to the ED on 03/03/2017 for SOB and was found to have pleural effusion. He was also to have a mass in the upper lobe of left lung after a CT chest Wo contrast with results showing:IMPRESSION:1. 2.4 x 3.6 cm apparent mass in the posterior left upper lobe with associated left upper lobe interlobular septal thickening. Imaging features concerning for neoplasm with lymphangitic tumor spread. 2. Small left pleural effusion. 3. Large hiatal hernia contains nearly the entire stomach with evidence of organoaxial volvulus. No evidence for gastric obstruction. 4. 19 mm right adrenal adenoma.  He is accompanied by his wife and daughter. He states that he began feeling SOB 2 weeks ago. He is not a smoker and has not smoked in the past. He states that he has not been evaluated by his cardiologist, Dr. Larae Grooms. For his recent SOB.  On review of systems, patient reports fatigue, intermittent SOB, and denies fever, chills, CP, cough, weight loss, and any other accompanying symptoms.  INTERVAL HISTORY:   Scott [name] is here for f/u of his recently diagnosed lung adenocarcinoma. The patient's last visit with [place] was on 08/14/17. He is accompanied today by a friend. The pt reports that he is doing well overall.   The pt reports that he had developed some SOB on 09/03/17, which was worked up with a CXR as noted below. He did not have any fluid removal since 09/03/17. He  notes that his breathing has not been very labored in the day time and has no current SOB today. He notes that he has had some SOB on a few nights and associates lying down with this symptom.  He notes that he has been eating well. He notes no concerns taking his Tagrisso.   Of note since the patient's last visit, pt has had CXR of Left Decubitus completed on 09/03/17 with results revealing Large left effusion layers freely.  Lab results today (09/15/17) of CBC, CMP, and Reticulocytes is as follows: all values are WNL except for Hgb at 12.1, MCHC at 31.4, Glucose at 62, BUN at 33, Creatinine at 2.06, Albumin at 3.3.  On review of systems, pt reports eating well, some fatigue, coughing, and denies SOB, difficulty breathing, changing weight, coughing up blood, abdominal pain, leg swelling, and any other symptoms.   MEDICAL HISTORY:  Past Medical History:  Diagnosis Date  . Anxiety   . Bradycardia    a. BB d/c'd in 2016.  [name] CAD (coronary artery disease)    a. 1983 s/p CABG [facility] [place]);  b. 2007 NSTEMI->med Rx for distal LCX dzs; c. 09/2014 NSTEMI/Cath: LIMA->LAD & diag ok, native RCA dominant and patent.   VG->RCA 100, VG->LCX 100 -->Med Rx.  . CKD (chronic kidney disease), stage III ([place])   . Claudication ([place])    BLE  . Depression   . Diastolic dysfunction    a. 10/2014 Echo: F 60-65%, Gr1 DD, mild AS, mild AI, mild to mod  MR, PASP 50mHg.  .Marland KitchenDyspnea 2010   syndrome - extensive  . Erectile dysfunction   . GERD (gastroesophageal reflux disease)   . H/O hiatal hernia   . History of blood transfusion 2014  . Hx of adenomatous colonic polyps 2007  . Hyperlipidemia   . Hypertensive heart disease   . Inguinal hernia   . Iron deficiency anemia   . Shingles 1990's  . Sigmoid diverticulosis   . Upper GI bleeding 2014   Gastritis and possible duodenal ulcer seen on EGD, felt secondary to NSAIDs  . Urethral stricture 11/2008  . Vasovagal syncope 05/2006  . Weight loss     SURGICAL  HISTORY: Past Surgical History:  Procedure Laterality Date  . CARDIAC CATHETERIZATION  2007   Left main 100%, RCA 70% ostial, all grafts were patent, EF 40-45 percent, distal circumflex subtotal after graft insertion, medical therapy   . CARDIAC CATHETERIZATION N/A 10/14/2014   Procedure: Left Heart Cath and Cors/Grafts Angiography;  Surgeon: JLorretta Harp MD;  [facility];  Service: Cardiovascular;  Laterality: N/A;  . CHOLECYSTECTOMY  1980's  . CORONARY ARTERY BYPASS GRAFT  1994    LIMA-LAD, SVG-D1, SVG-OM,  SV[place]RCA[place] . ESOPHAGOGASTRODUODENOSCOPY N/A 03/25/2013   Procedure: ESOPHAGOGASTRODUODENOSCOPY (EGD);  Surgeon: JMissy Sabins MD;  gastritis, cannot rule out duodenal ulcer   . INGUINAL HERNIA REPAIR Left   . TRANSURETHRAL RESECTION OF PROSTATE      SOCIAL HISTORY: Social History   Socioeconomic History  . Marital status: Married    Spouse name: Not on file  . Number of children: Not on file  . Years of education: Not on file  . Highest education level: Not on file  Occupational History  . Occupation: Retired  SScientific laboratory technician . Financial resource strain: Not on file  . Food insecurity:    Worry: Not on file    Inability: Not on file  . Transportation needs:    Medical: Not on file    Non-medical: Not on file  Tobacco Use  . Smoking status: Never Smoker  . Smokeless tobacco: Never Used  Substance and Sexual Activity  . Alcohol use: Yes    Alcohol/week: 4.2 oz    Types: 7 Glasses of wine per week    Comment: wine - daily - currently not drinking  . Drug use: No  . Sexual activity: Never    Birth control/protection: Abstinence  Lifestyle  . Physical activity:    Days per week: Not on file    Minutes per session: Not on file  . Stress: Not on file  Relationships  . Social connections:    Talks on phone: Not on file    Gets together: Not on file    Attends religious service: Not on file    Active member of  club or organization: Not on file    Attends meetings of clubs or organizations: Not on file    Relationship status: Not on file  . Intimate partner violence:    Fear of current or ex partner: Not on file    Emotionally abused: Not on file    Physically abused: Not on file    Forced sexual activity: Not on file  Other Topics Concern  . Not on file  Social History Narrative   [facility] with his wife.    FAMILY HISTORY: Family History  Problem Relation Age of Onset  . Hypertension Mother   .  Heart disease Mother   . Heart disease Father   . Heart failure Brother   . Heart failure Sister   . Heart failure Brother   . Heart attack Neg Hx   . Stroke Neg Hx     ALLERGIES:  has No Known Allergies.  MEDICATIONS:  Current Outpatient Medications  Medication Sig Dispense Refill  . albuterol (PROAIR HFA) 108 (90 Base) MCG/ACT inhaler Inhale 2 puffs into the lungs every 6 (six) hours as needed for wheezing or shortness of breath.    [name] amLODipine (NORVASC) 5 MG tablet TAKE 1 TABLET BY MOUTH  DAILY 90 tablet 0  . BREO ELLIPTA 100-25 MCG/INH AEPB 1 PUFF BY MOUTH DAILY  11  . buPROPion (WELLBUTRIN XL) 150 MG 24 hr tablet Take 150 mg by mouth daily.     [name] desvenlafaxine (PRISTIQ) 50 MG 24 hr tablet Take 25 mg by mouth daily.     [name] dexamethasone (DECADRON) 2 MG tablet Take 1 tablet (2 mg total) by mouth daily with breakfast. (Patient not taking: Reported on 09/03/2017) 30 tablet 0  . FERROCITE 324 MG TABS tablet TAKE 1 TABLET BY MOUTH QOD  11  . isosorbide mononitrate (IMDUR) 30 MG 24 hr tablet Take 30 mg by mouth daily.    [name] LYRICA 50 MG capsule Take 50 mg by mouth daily as needed (pain).     . nitroGLYCERIN (NITROSTAT) 0.4 MG SL tablet Place 1 tablet (0.4 mg total) under the tongue every 5 (five) minutes x 3 doses as needed for chest pain. 25 tablet 4  . Omega-3 Fatty Acids (FISH OIL PO) Take 1 capsule by mouth daily.    . OXYGEN Inhale 2 L into the lungs  continuous.    . pantoprazole (PROTONIX) 40 MG tablet Take 1 tablet (40 mg total) by mouth 2 (two) times daily before a meal. 60 tablet 3  . TAGRISSO 80 MG tablet TAKE 1 TABLET (80 MG TOTAL) BY MOUTH DAILY. 30 tablet 2  . vitamin B-12 (CYANOCOBALAMIN) 1000 MCG tablet Take 1,000 mcg by mouth every evening.      No current facility-administered medications for this visit.     REVIEW OF SYSTEMS:    10 Point review of Systems was done is negative except as noted above.   PHYSICAL EXAMINATION:  ECOG PERFORMANCE STATUS: 2 - Symptomatic, <50% confined to bed  .BP (!) 161/78 (BP Location: Left Arm, Patient Position: Sitting) Comment: nurse aware of bp  Pulse 73   Temp 97.6 F (36.4 C) (Oral)   Resp 17   Ht 5' 6"  (1.676 m)   Wt 154 lb (69.9 kg)   SpO2 95%   BMI 24.86 kg/m   . GENERAL:alert, in no acute distress and comfortable, elderly man SKIN: no acute rashes, no significant lesions EYES: conjunctiva are pink and non-injected, sclera anicteric OROPHARYNX: MMM, no exudates, no oropharyngeal erythema or ulceration NECK: supple, no JVD LYMPH:  no palpable lymphadenopathy in the cervical, axillary or inguinal regions LUNGS: left lung base decreased breath sounds. HEART: regular rate & rhythm ABDOMEN:  normoactive bowel sounds , non tender, not distended. Extremity: trace pedal edema PSYCH: alert & oriented x 3 with fluent speech NEURO: no focal motor/sensory deficits    LABORATORY DATA:  I have reviewed the data as listed  . CBC Latest Ref Rng & Units 09/15/2017 09/03/2017 08/14/2017  WBC 4.0 - 10.3 K/uL 5.8 7.7 9.2  Hemoglobin 13.0 - 17.1 g/dL 12.1(L) 12.8(L) 12.2(L)  Hematocrit 38.4 - 49.9 %  38.5 41.0 38.3(L)  Platelets 140 - 400 K/uL 153 126(L) 176  HGB 12.2  . CMP Latest Ref Rng & Units 09/15/2017 09/03/2017 08/14/2017  Glucose 70 - 140 mg/dL 62(L) 100 96  BUN 7 - 26 mg/dL 33(H) 29(H) 31(H)  Creatinine 0.70 - 1.30 mg/dL 2.06(H) 1.74(H) 1.76(H)  Sodium 136 - 145 mmol/L 141  140 140  Potassium 3.5 - 5.1 mmol/L 4.3 4.4 4.7  Chloride 98 - 109 mmol/L 108 108 106  CO2 22 - 29 mmol/L 26 27 27   Calcium 8.4 - 10.4 mg/dL 9.7 9.2 9.3  Total Protein 6.4 - 8.3 g/dL 7.0 6.3(L) 6.4  Total Bilirubin 0.2 - 1.2 mg/dL 0.2 0.3 0.4  Alkaline Phos 40 - 150 U/L 75 57 55  AST 5 - 34 U/L 31 17 15   ALT 0 - 55 U/L 28 17 17    Component     Latest Ref Rng & Units 08/14/2017  TSH     0.450 - 4.500 uIU/mL 2.530  Thyroxine (T4)     4.5 - 12.0 ug/dL 6.5  T3 Uptake Ratio     24 - 39 % 26  Free Thyroxine Index     1.2 - 4.9 1.7     RADIOGRAPHIC STUDIES: I have personally reviewed the radiological images as listed and agreed with the findings in the report. Dg Chest 2 View  Result Date: 09/15/2017 CLINICAL DATA:  Increased shortness of breath. Malignant pleural effusion. Primary adenocarcinoma of the upper lobe of the left lung. EXAM: CHEST - 2 VIEW COMPARISON:  Chest x-rays dated 09/03/2017, 08/08/2017 and 06/20/2017 and chest CT dated 08/01/2017 FINDINGS: There is a recurrent large left pleural effusion with compressive atelectasis of the left lung, similar to the appearance on the study of 06/20/2017. Right lung is clear.  Pulmonary vascularity is normal. Chronic large hiatal hernia no acute bone abnormality. Multiple surgical clips in the chest.  Previous median sternotomy. IMPRESSION: 1. Recurrent large left pleural effusion with compressive atelectasis of the left lung. 2. Chronic large hiatal hernia. Electronically Signed   By: Lorriane Shire M.D.   On: 09/15/2017 11:59   Dg Chest Left Decubitus  Result Date: 09/03/2017 CLINICAL DATA:  Worsening shortness of breath. Recurrent pleural effusion. EXAM: CHEST - LEFT DECUBITUS COMPARISON:  08/08/2017 FINDINGS: Left-side-down decubitus films show layering of a large left effusion. No effusion visible on the right. IMPRESSION: Large left effusion layers freely. Electronically Signed   By: Nelson Chimes M.D.   On: 09/03/2017 15:17     ASSESSMENT & PLAN:   Scott [name] is a wonderful 84 y.o. male with  1) Metastatic Left lung Adenocarcinoma involving LUL lung mass left lung with associated moderated malignant left pleural effusion and left hilar LNadenopathy. EGFR mutated lung adenocarcinoma  2) DOE improved s/p recent therapeutic thoracentesis. But rpt CXR today still shows significant left pleural effusion which on recent decubitus XR was noted to be free flowing.  3) Grade 1-2 fatigue -- multifactorial Age+ significant burden of medical issues + SOE/DOE from pleural effusion + fatigue from medication. Much improved with dexamethasone and after recent therapeutic thoracentesis. PLAN  --Discussed pt labwork today 09/15/17; blood counts and chemistries are stable.  -Blood chemistries suggest some dehydration, I recommended that the pt continue to stay hydrated.  -The pt has no prohibitive toxicities from continuing [name] at this time.  - repeat CXR today today still shows significant left pleural effusion that causing some shortness of breath.  -we will set him up for  a rpt US guided therapeutic thoracentesis. -Would like to repeat CT Chest before next follow up at end of May  -Encouraged pt to continue with PT at his facility   3) Grade 1-2 Fatigue - multifactorial -- improved some. Component     Latest Ref Rng & Units 08/14/2017  TSH     0.450 - 4.500 uIU/mL 2.530  Thyroxine (T4)     4.5 - 12.0 ug/dL 6.5  T3 Uptake Ratio     24 - 39 % 26  Free Thyroxine Index     1.2 - 4.9 1.7   -thyroid function tests WNL -optimizing nutrition -maintain active lifestyle.  4) Anorexia. -improved with low dose dexamethasone. -encouraged to continue good po intake. -Advised with the patientto continue to use boost or ensure as a nutritional supplement.    CXR today CT chest WO contrast in 4 weeks RTC with Dr Irene Limbo in 5 weeks with labs    All of the patients questions were answered with apparent  satisfaction. The patient knows to call the clinic with any problems, questions or concerns.  . The total time spent in the appointment was 25 minutes and more than 50% was on counseling and direct patient cares.   Sullivan Lone MD [place] AAHIVMS [facility] Hematology/Oncology Physician [facility]  (Office):       [phone] (Work cell):  [phone] (Fax):           [phone]  This document serves as a record of services personally performed by Sullivan Lone, MD. It was created on his behalf by Baldwin Jamaica, a trained medical scribe. The creation of this record is based on the scribe's personal observations and the provider's statements to them.   .I have reviewed the above documentation for accuracy and completeness, and I agree with the above. Brunetta Genera MD MS

## 2017-09-15 ENCOUNTER — Inpatient Hospital Stay: Payer: Medicare Other

## 2017-09-15 ENCOUNTER — Telehealth: Payer: Self-pay

## 2017-09-15 ENCOUNTER — Telehealth: Payer: Self-pay | Admitting: Hematology

## 2017-09-15 ENCOUNTER — Inpatient Hospital Stay (HOSPITAL_BASED_OUTPATIENT_CLINIC_OR_DEPARTMENT_OTHER): Payer: Medicare Other | Admitting: Hematology

## 2017-09-15 ENCOUNTER — Ambulatory Visit (HOSPITAL_COMMUNITY)
Admission: RE | Admit: 2017-09-15 | Discharge: 2017-09-15 | Disposition: A | Discharge status: Home / Self Care | Payer: Medicare Other | Source: Ambulatory Visit | Attending: Hematology | Admitting: Hematology

## 2017-09-15 VITALS — BP 161/78 | HR 73 | Temp 97.6°F | Resp 17 | Ht 66.0 in | Wt 154.0 lb

## 2017-09-15 DIAGNOSIS — E785 Hyperlipidemia, unspecified: Secondary | ICD-10-CM | POA: Diagnosis not present

## 2017-09-15 DIAGNOSIS — I252 Old myocardial infarction: Secondary | ICD-10-CM

## 2017-09-15 DIAGNOSIS — I130 Hypertensive heart and chronic kidney disease with heart failure and stage 1 through stage 4 chronic kidney disease, or unspecified chronic kidney disease: Secondary | ICD-10-CM

## 2017-09-15 DIAGNOSIS — K219 Gastro-esophageal reflux disease without esophagitis: Secondary | ICD-10-CM

## 2017-09-15 DIAGNOSIS — R5383 Other fatigue: Secondary | ICD-10-CM | POA: Diagnosis not present

## 2017-09-15 DIAGNOSIS — J910 Malignant pleural effusion: Secondary | ICD-10-CM

## 2017-09-15 DIAGNOSIS — J90 Pleural effusion, not elsewhere classified: Secondary | ICD-10-CM

## 2017-09-15 DIAGNOSIS — C3412 Malignant neoplasm of upper lobe, left bronchus or lung: Secondary | ICD-10-CM

## 2017-09-15 DIAGNOSIS — R0602 Shortness of breath: Secondary | ICD-10-CM

## 2017-09-15 DIAGNOSIS — K449 Diaphragmatic hernia without obstruction or gangrene: Secondary | ICD-10-CM | POA: Diagnosis not present

## 2017-09-15 DIAGNOSIS — N183 Chronic kidney disease, stage 3 (moderate): Secondary | ICD-10-CM | POA: Diagnosis not present

## 2017-09-15 DIAGNOSIS — I2510 Atherosclerotic heart disease of native coronary artery without angina pectoris: Secondary | ICD-10-CM

## 2017-09-15 DIAGNOSIS — Z79899 Other long term (current) drug therapy: Secondary | ICD-10-CM

## 2017-09-15 DIAGNOSIS — I5032 Chronic diastolic (congestive) heart failure: Secondary | ICD-10-CM | POA: Diagnosis not present

## 2017-09-15 DIAGNOSIS — Z951 Presence of aortocoronary bypass graft: Secondary | ICD-10-CM | POA: Diagnosis not present

## 2017-09-15 DIAGNOSIS — R630 Anorexia: Secondary | ICD-10-CM

## 2017-09-15 DIAGNOSIS — J9811 Atelectasis: Secondary | ICD-10-CM | POA: Diagnosis not present

## 2017-09-15 DIAGNOSIS — D3501 Benign neoplasm of right adrenal gland: Secondary | ICD-10-CM

## 2017-09-15 DIAGNOSIS — Z9981 Dependence on supplemental oxygen: Secondary | ICD-10-CM

## 2017-09-15 LAB — CMP (CANCER CENTER ONLY)
ALT: 28 U/L (ref 0–55)
ANION GAP: 7 (ref 3–11)
AST: 31 U/L (ref 5–34)
Albumin: 3.3 g/dL — ABNORMAL LOW (ref 3.5–5.0)
Alkaline Phosphatase: 75 U/L (ref 40–150)
BUN: 33 mg/dL — ABNORMAL HIGH (ref 7–26)
CHLORIDE: 108 mmol/L (ref 98–109)
CO2: 26 mmol/L (ref 22–29)
Calcium: 9.7 mg/dL (ref 8.4–10.4)
Creatinine: 2.06 mg/dL — ABNORMAL HIGH (ref 0.70–1.30)
GFR, EST AFRICAN AMERICAN: 31 mL/min — AB (ref >60)
GFR, Estimated: 27 mL/min — ABNORMAL LOW (ref >60)
Glucose, Bld: 62 mg/dL — ABNORMAL LOW (ref 70–140)
Potassium: 4.3 mmol/L (ref 3.5–5.1)
SODIUM: 141 mmol/L (ref 136–145)
Total Bilirubin: 0.2 mg/dL (ref 0.2–1.2)
Total Protein: 7 g/dL (ref 6.4–8.3)

## 2017-09-15 LAB — CBC WITH DIFFERENTIAL (CANCER CENTER ONLY)
BASOS PCT: 0 %
Basophils Absolute: 0 10*3/uL (ref 0.0–0.1)
Eosinophils Absolute: 0.2 10*3/uL (ref 0.0–0.5)
Eosinophils Relative: 4 %
HEMATOCRIT: 38.5 % (ref 38.4–49.9)
HEMOGLOBIN: 12.1 g/dL — AB (ref 13.0–17.1)
Lymphocytes Relative: 18 %
Lymphs Abs: 1 10*3/uL (ref 0.9–3.3)
MCH: 28.1 pg (ref 27.2–33.4)
MCHC: 31.4 g/dL — ABNORMAL LOW (ref 32.0–36.0)
MCV: 89.5 fL (ref 79.3–98.0)
MONOS PCT: 9 %
Monocytes Absolute: 0.5 10*3/uL (ref 0.1–0.9)
NEUTROS ABS: 4 10*3/uL (ref 1.5–6.5)
NEUTROS PCT: 69 %
Platelet Count: 153 10*3/uL (ref 140–400)
RBC: 4.3 MIL/uL (ref 4.20–5.82)
RDW: 14.5 % (ref 11.0–14.6)
WBC Count: 5.8 10*3/uL (ref 4.0–10.3)

## 2017-09-15 LAB — RETICULOCYTES
RBC.: 4.3 MIL/uL (ref 4.20–5.82)
RETIC COUNT ABSOLUTE: 73.1 10*3/uL (ref 34.8–93.9)
Retic Ct Pct: 1.7 % (ref 0.8–1.8)

## 2017-09-15 NOTE — Telephone Encounter (Signed)
I was unable to schedule f/u appointments due to patients family not being with him.  His caregiver stated that they would call back to schedule per 4/29 los

## 2017-09-15 NOTE — Telephone Encounter (Signed)
Left a detailed voice message concerning upcoming appointments of o/v and CT appointment made also. Per 4/29 los

## 2017-09-17 ENCOUNTER — Encounter: Payer: Self-pay | Admitting: Nurse Practitioner

## 2017-09-17 ENCOUNTER — Non-Acute Institutional Stay: Payer: Medicare Other | Admitting: Nurse Practitioner

## 2017-09-17 DIAGNOSIS — K648 Other hemorrhoids: Secondary | ICD-10-CM | POA: Diagnosis not present

## 2017-09-17 DIAGNOSIS — F325 Major depressive disorder, single episode, in full remission: Secondary | ICD-10-CM

## 2017-09-17 DIAGNOSIS — I25700 Atherosclerosis of coronary artery bypass graft(s), unspecified, with unstable angina pectoris: Secondary | ICD-10-CM

## 2017-09-17 DIAGNOSIS — R5381 Other malaise: Secondary | ICD-10-CM | POA: Insufficient documentation

## 2017-09-17 DIAGNOSIS — C3412 Malignant neoplasm of upper lobe, left bronchus or lung: Secondary | ICD-10-CM

## 2017-09-17 DIAGNOSIS — N184 Chronic kidney disease, stage 4 (severe): Secondary | ICD-10-CM | POA: Diagnosis not present

## 2017-09-17 DIAGNOSIS — D509 Iron deficiency anemia, unspecified: Secondary | ICD-10-CM | POA: Diagnosis not present

## 2017-09-17 DIAGNOSIS — R0902 Hypoxemia: Secondary | ICD-10-CM | POA: Diagnosis not present

## 2017-09-17 DIAGNOSIS — I10 Essential (primary) hypertension: Secondary | ICD-10-CM

## 2017-09-17 DIAGNOSIS — K219 Gastro-esophageal reflux disease without esophagitis: Secondary | ICD-10-CM

## 2017-09-17 NOTE — Assessment & Plan Note (Signed)
Prn NTG available to him, continue Isosorbide 30mg  qd, Fish oil for risk reduction.

## 2017-09-17 NOTE — Progress Notes (Signed)
Location:  [facility] Room Number: 948 NIOEV of Service:  [phone])  Provider: Marlana Latus  NP  PCP: [name], MD Patient Care Team: [name], MD as PCP - General (Internal Medicine) Sandralee Tarkington X, NP as Nurse Practitioner (Internal Medicine)  Extended Emergency Contact Information Primary Emergency Contact: [facility] Address: [address]          [place], [place] [address] Johnnette Litter of [place] Phone: [phone] Relation: Spouse Secondary Emergency Contact: Lucy,[name] Address: [place], [place] [address] Johnnette Litter of [place] Phone: [phone] Work Phone: [phone] Mobile Phone: [phone] Relation: Daughter  Code Status: DNR Goals of care:  Advanced Directive information Advanced Directives 09/17/2017  Does Patient Have a Medical Advance Directive? Yes  Type of Advance Directive Out of facility DNR (pink MOST or yellow form);Healthcare Power of Attorney  Does patient want to make changes to medical advance directive? No - Patient declined  Copy of [place] in Chart? Yes  Would patient like information on creating a medical advance directive? -  Pre-existing out of facility DNR order (yellow form or pink MOST form) Yellow form placed in chart (order not valid for inpatient use)     No Known Allergies  Chief Complaint  Patient presents with  . Discharge Note    Discharging from infirmary to admit to AL    HPI:  84 y.o. male is seen today for discharge from infirmary stay AL [facility]. He has recent physical deconditioning related to his adenocarcinoma of upper lobe of left lung, recurrent left pleural effusion. He was admitted to [facility] 09/09/17 for infirmary stay since he became insufficient in self care in [place] setting, the level of AL care meets his current care needs, he is officially admitted to [facility] today.   He has history of stage 4 CKD,  HTN, CAD, GERD, cognitive impairment, internal hemorrhoids, depression, among other.     Past Medical History:  Diagnosis Date  . Anxiety   . Bradycardia    a. BB d/c'd in 2016.  [name] CAD (coronary artery disease)    a. 1983 s/p CABG [facility] [place]);  b. 2007 NSTEMI->med Rx for distal LCX dzs; c. 09/2014 NSTEMI/Cath: LIMA->LAD & diag ok, native RCA dominant and patent.   VG->RCA 100, VG->LCX 100 -->Med Rx.  . CKD (chronic kidney disease), stage III ([place])   . Claudication ([place])    BLE  . Depression   . Diastolic dysfunction    a. 10/2014 Echo: F 60-65%, Gr1 DD, mild AS, mild AI, mild to mod MR, PASP 66mmHg.  [name] Dyspnea 2010   syndrome - extensive  . Erectile dysfunction   . GERD (gastroesophageal reflux disease)   . H/O hiatal hernia   . History of blood transfusion 2014  . Hx of adenomatous colonic polyps 2007  . Hyperlipidemia   . Hypertensive heart disease   . Inguinal hernia   . Iron deficiency anemia   . Shingles 1990's  . Sigmoid diverticulosis   . Upper GI bleeding 2014   Gastritis and possible duodenal ulcer seen on EGD, felt secondary to NSAIDs  . Urethral stricture 11/2008  . Vasovagal syncope 05/2006  . Weight loss     Past Surgical History:  Procedure Laterality Date  . CARDIAC CATHETERIZATION  2007   Left main 100%, RCA 70% ostial, all  grafts were patent, EF 40-45 percent, distal circumflex subtotal after graft insertion, medical therapy   . CARDIAC CATHETERIZATION N/A 10/14/2014   Procedure: Left Heart Cath and Cors/Grafts Angiography;  Surgeon: Lorretta Harp, MD;  Location: [facility] CV LAB;  Service: Cardiovascular;  Laterality: N/A;  . CHOLECYSTECTOMY  1980's  . CORONARY ARTERY BYPASS GRAFT  1994    LIMA-LAD, SVG-D1, SVG-OM,  SVG-RCA; performed in [place], [place]  . ESOPHAGOGASTRODUODENOSCOPY N/A 03/25/2013   Procedure: ESOPHAGOGASTRODUODENOSCOPY (EGD);  Surgeon: Missy Sabins, MD;  gastritis, cannot rule out duodenal ulcer   . INGUINAL HERNIA REPAIR  Left   . TRANSURETHRAL RESECTION OF PROSTATE        reports that he has never smoked. He has never used smokeless tobacco. He reports that he drinks about 4.2 oz of alcohol per week. He reports that he does not use drugs. Social History   Socioeconomic History  . Marital status: Married    Spouse name: Not on file  . Number of children: Not on file  . Years of education: Not on file  . Highest education level: Not on file  Occupational History  . Occupation: Retired  Scientific laboratory technician  . Financial resource strain: Not on file  . Food insecurity:    Worry: Not on file    Inability: Not on file  . Transportation needs:    Medical: Not on file    Non-medical: Not on file  Tobacco Use  . Smoking status: Never Smoker  . Smokeless tobacco: Never Used  Substance and Sexual Activity  . Alcohol use: Yes    Alcohol/week: 4.2 oz    Types: 7 Glasses of wine per week    Comment: wine - daily - currently not drinking  . Drug use: No  . Sexual activity: Never    Birth control/protection: Abstinence  Lifestyle  . Physical activity:    Days per week: Not on file    Minutes per session: Not on file  . Stress: Not on file  Relationships  . Social connections:    Talks on phone: Not on file    Gets together: Not on file    Attends religious service: Not on file    Active member of club or organization: Not on file    Attends meetings of clubs or organizations: Not on file    Relationship status: Not on file  . Intimate partner violence:    Fear of current or ex partner: Not on file    Emotionally abused: Not on file    Physically abused: Not on file    Forced sexual activity: Not on file  Other Topics Concern  . Not on file  Social History Narrative   He lives in [facility], independent living, with his wife.   Functional Status Survey:    No Known Allergies  Pertinent  Health Maintenance Due  Topic Date Due  . INFLUENZA VACCINE  12/18/2017  . PNA vac Low Risk Adult (2 of 2  - PCV13) 05/20/2018    Medications: Outpatient Encounter Medications as of 09/17/2017  Medication Sig  . acetaminophen (TYLENOL) 500 MG tablet Take 500 mg by mouth every 6 (six) hours as needed for mild pain or moderate pain.  [name] albuterol (PROAIR HFA) 108 (90 Base) MCG/ACT inhaler Inhale 2 puffs into the lungs every 6 (six) hours as needed for wheezing or shortness of breath.  [name] amLODipine (NORVASC) 5 MG tablet TAKE 1 TABLET BY MOUTH  DAILY  . buPROPion [facility]  SR) 150 MG 12 hr tablet Take 150 mg by mouth daily.  [name] FERROCITE 324 MG TABS tablet TAKE 1 TABLET BY MOUTH QOD  . isosorbide mononitrate (IMDUR) 30 MG 24 hr tablet Take 30 mg by mouth daily.  . nitroGLYCERIN (NITROSTAT) 0.4 MG SL tablet Place 1 tablet (0.4 mg total) under the tongue every 5 (five) minutes x 3 doses as needed for chest pain.  . Omega-3 Fatty Acids (FISH OIL PO) Take 1 capsule by mouth daily.  . OXYGEN Inhale 2 L into the lungs continuous.  . pantoprazole (PROTONIX) 40 MG tablet Take 1 tablet (40 mg total) by mouth 2 (two) times daily before a meal.  . pregabalin (LYRICA) 50 MG capsule Take 50 mg by mouth at bedtime.  [name] TAGRISSO 80 MG tablet TAKE 1 TABLET (80 MG TOTAL) BY MOUTH DAILY.  . vitamin B-12 (CYANOCOBALAMIN) 1000 MCG tablet Take 1,000 mcg by mouth every evening.    No facility-administered encounter medications on file as of 09/17/2017.     Review of Systems  Constitutional: Positive for activity change and fatigue. Negative for appetite change, chills, diaphoresis and fever.  HENT: Positive for hearing loss. Negative for congestion, trouble swallowing and voice change.   Eyes: Negative for visual disturbance.  Respiratory: Positive for shortness of breath. Negative for cough, choking and chest tightness.   Cardiovascular: Positive for leg swelling. Negative for chest pain and palpitations.  Gastrointestinal: Negative for abdominal distention, abdominal pain, constipation, diarrhea, nausea and vomiting.    Genitourinary: Negative for difficulty urinating, dysuria and urgency.  Musculoskeletal: Positive for gait problem. Negative for joint swelling.  Skin: Negative for color change and pallor.  Neurological: Negative for dizziness, speech difficulty, weakness and headaches.       Memory lapses.   Psychiatric/Behavioral: Negative for agitation, behavioral problems, confusion, hallucinations and sleep disturbance. The patient is not nervous/anxious.     Vitals:   09/17/17 1025  BP: 130/70  Pulse: 66  Resp: 18  Temp: 98.1 F (36.7 C)  SpO2: 92%  Weight: 154 lb (69.9 kg)  Height: 5\' 6"  (1.676 m)   Body mass index is 24.86 kg/m. Physical Exam  Constitutional: He is oriented to person, place, and time. He appears well-developed and well-nourished. No distress.  HENT:  Head: Normocephalic and atraumatic.  Eyes: Pupils are equal, round, and reactive to light. Conjunctivae and EOM are normal.  Neck: Normal range of motion. Neck supple. No JVD present. No thyromegaly present.  Cardiovascular: Normal rate and regular rhythm.  Murmur heard. Pulmonary/Chest: He has no wheezes. He exhibits no tenderness.  DOE, O2 2lpm via , left lung diminished air movement.   Abdominal: Soft. Bowel sounds are normal. He exhibits no distension. There is no tenderness. There is no rebound and no guarding.  Musculoskeletal: He exhibits edema.  Trace edema BLE, self transfer, ambulates with cane.   Neurological: He is alert and oriented to person, place, and time. No cranial nerve deficit. He exhibits normal muscle tone. Coordination normal.  Skin: He is not diaphoretic.    Labs reviewed: Basic Metabolic Panel: Recent Labs    05/23/17 0947 05/23/17 0948 06/20/17 1054  08/01/17 1106 08/14/17 1434 09/03/17 1423 09/15/17 0924  NA  --  140 140   < > 142 140 140 141  K  --  5.0 5.4*   < > 4.5 4.7 4.4 4.3  CL  --   --  107   < > 109 106 108 108  CO2  --  24 26   < > 25 27 27 26   GLUCOSE  --  81 104  --   65* 96 100 62*  BUN  --  41.7* 35*   < > 26 31* 29* 33*  CREATININE  --  2.5* 2.30*   < > 1.69* 1.76* 1.74* 2.06*  CALCIUM  --  9.2 9.3   < > 9.3 9.3 9.2 9.7  MG  --  3.2* 3.0*  --  2.4  --   --   --   PHOS 3.7  --  3.4  --   --   --   --   --    < > = values in this interval not displayed.   Liver Function Tests: Recent Labs    08/14/17 1434 09/03/17 1423 09/15/17 0924  AST 15 17 31   ALT 17 17 28   ALKPHOS 55 57 75  BILITOT 0.4 0.3 0.2  PROT 6.4 6.3* 7.0  ALBUMIN 3.2* 3.2* 3.3*   Recent Labs    04/09/17 1352  LIPASE 48   No results for input(s): AMMONIA in the last 8760 hours. CBC: Recent Labs    08/14/17 1434 09/03/17 1423 09/15/17 0924  WBC 9.2 7.7 5.8  NEUTROABS 7.6* 5.7 4.0  HGB 12.2* 12.8* 12.1*  HCT 38.3* 41.0 38.5  MCV 87.7 90.3 89.5  PLT 176 126* 153   Cardiac Enzymes: No results for input(s): CKTOTAL, CKMB, CKMBINDEX, TROPONINI in the last 8760 hours. BNP: Invalid input(s): POCBNP CBG: Recent Labs    03/17/17 0821  GLUCAP 89    Procedures and Imaging Studies During Stay: Dg Chest 2 View  Result Date: 09/15/2017 CLINICAL DATA:  Increased shortness of breath. Malignant pleural effusion. Primary adenocarcinoma of the upper lobe of the left lung. EXAM: CHEST - 2 VIEW COMPARISON:  Chest x-rays dated 09/03/2017, 08/08/2017 and 06/20/2017 and chest CT dated 08/01/2017 FINDINGS: There is a recurrent large left pleural effusion with compressive atelectasis of the left lung, similar to the appearance on the study of 06/20/2017. Right lung is clear.  Pulmonary vascularity is normal. Chronic large hiatal hernia no acute bone abnormality. Multiple surgical clips in the chest.  Previous median sternotomy. IMPRESSION: 1. Recurrent large left pleural effusion with compressive atelectasis of the left lung. 2. Chronic large hiatal hernia. Electronically Signed   By: Lorriane Shire M.D.   On: 09/15/2017 11:59   Dg Chest Left Decubitus  Result Date: 09/03/2017 CLINICAL  DATA:  Worsening shortness of breath. Recurrent pleural effusion. EXAM: CHEST - LEFT DECUBITUS COMPARISON:  08/08/2017 FINDINGS: Left-side-down decubitus films show layering of a large left effusion. No effusion visible on the right. IMPRESSION: Large left effusion layers freely. Electronically Signed   By: Nelson Chimes M.D.   On: 09/03/2017 15:17    Assessment/Plan:   Physical deconditioning He has recent physical deconditioning related to his adenocarcinoma of upper lobe of left lung, recurrent left pleural effusion. Admitting to [facility] for higher level of care.    Internal hemorrhoids Avoid constipation. observe  Essential hypertension Blood pressure is controlled, continue Amlodipine 5mg  qd.   CAD (coronary artery disease) of artery bypass graft, ALL VGs occluded, patent LIMA -->LAD, patent dominant RCA Prn NTG available to him, continue Isosorbide 30mg  qd, Fish oil for risk reduction.   Primary adenocarcinoma of upper lobe of left lung ([place]) Continue to f/u oncology, continue Tagrisso 80mg  qd  GERD (gastroesophageal reflux disease) Stable, continue Pantoprazole 40mg  qd.   CKD (chronic kidney disease), stage IV ([place])  Stage 4, creat is near 2, observe.   Depression He smiled during today's visit, he stated he sleeps and eats as usual, continue Desvenlafaxine 25mg  qd, Bupropion 150mg  qd.   Anemia Stable, continue Fe, last Hgb 12.1 09/15/17  Hypoxemia Lung cancer, left pleural effusion, s/p thoracentesis. Will administer O2 @ 2lpm via [place] to maintain Sat O2>90%.       Patient is being discharged with the following home health services:    Patient is being discharged with the following durable medical equipment:    Patient has been advised to f/u with their PCP in 1-2 weeks to for a transitions of care visit.  Social services at their facility was responsible for arranging this appointment.  Pt was provided with adequate prescriptions of noncontrolled medications to reach the  scheduled appointment .  For controlled substances, a limited supply was provided as appropriate for the individual patient.  If the pt normally receives these medications from a pain clinic or has a contract with another physician, these medications should be received from that clinic or physician only).    Future labs/tests needed:  Prn  Time spend 25 minutes.

## 2017-09-17 NOTE — Assessment & Plan Note (Signed)
Blood pressure is controlled, continue Amlodipine 5mg  qd.

## 2017-09-17 NOTE — Assessment & Plan Note (Signed)
He has recent physical deconditioning related to his adenocarcinoma of upper lobe of left lung, recurrent left pleural effusion. Admitting to [facility] for higher level of care.

## 2017-09-17 NOTE — Assessment & Plan Note (Signed)
Avoid constipation. observe

## 2017-09-17 NOTE — Assessment & Plan Note (Signed)
Stable, continue Fe, last Hgb 12.1 09/15/17

## 2017-09-17 NOTE — Assessment & Plan Note (Signed)
Continue to f/u oncology, continue Tagrisso 80mg  qd

## 2017-09-17 NOTE — Assessment & Plan Note (Signed)
Lung cancer, left pleural effusion, s/p thoracentesis. Will administer O2 @ 2lpm via [place] to maintain Sat O2>90%.

## 2017-09-17 NOTE — Assessment & Plan Note (Signed)
Stable, continue Pantoprazole 40mg qd.  

## 2017-09-17 NOTE — Assessment & Plan Note (Signed)
He smiled during today's visit, he stated he sleeps and eats as usual, continue Desvenlafaxine 25mg  qd, Bupropion 150mg  qd.

## 2017-09-17 NOTE — Assessment & Plan Note (Signed)
Stage 4, creat is near 2, observe.

## 2017-09-19 ENCOUNTER — Other Ambulatory Visit: Payer: Medicare Other

## 2017-09-19 ENCOUNTER — Ambulatory Visit: Payer: Medicare Other | Admitting: Hematology

## 2017-09-19 ENCOUNTER — Telehealth: Payer: Self-pay

## 2017-09-19 NOTE — Telephone Encounter (Signed)
Called pt home number and left VM. Spoke with [employer] who had called pt and was waiting for return call to set up thoracentesis. Called pt daughter, Scott [name], to pass along message to pt regarding thoracentesis to be scheduled based on scan showing fluid around left lung. Pt daughter notified this RN of change in address and phone number for pt. Phone number changed in [place]. Pt daughter, Scott [name], to be notified of any scheduling needs for the patient [phone].

## 2017-09-22 ENCOUNTER — Ambulatory Visit (HOSPITAL_COMMUNITY)
Admission: RE | Admit: 2017-09-22 | Discharge: 2017-09-22 | Disposition: A | Discharge status: Home / Self Care | Payer: Medicare Other | Source: Ambulatory Visit | Attending: Hematology | Admitting: Hematology

## 2017-09-22 ENCOUNTER — Ambulatory Visit (HOSPITAL_COMMUNITY)
Admission: RE | Admit: 2017-09-22 | Discharge: 2017-09-22 | Disposition: A | Discharge status: Home / Self Care | Payer: Medicare Other | Source: Ambulatory Visit | Attending: Radiology | Admitting: Radiology

## 2017-09-22 DIAGNOSIS — J910 Malignant pleural effusion: Secondary | ICD-10-CM

## 2017-09-22 DIAGNOSIS — C3490 Malignant neoplasm of unspecified part of unspecified bronchus or lung: Secondary | ICD-10-CM | POA: Diagnosis not present

## 2017-09-22 DIAGNOSIS — J90 Pleural effusion, not elsewhere classified: Secondary | ICD-10-CM

## 2017-09-22 DIAGNOSIS — R846 Abnormal cytological findings in specimens from respiratory organs and thorax: Secondary | ICD-10-CM | POA: Diagnosis not present

## 2017-09-22 DIAGNOSIS — C3412 Malignant neoplasm of upper lobe, left bronchus or lung: Secondary | ICD-10-CM

## 2017-09-22 DIAGNOSIS — Z9889 Other specified postprocedural states: Secondary | ICD-10-CM | POA: Diagnosis not present

## 2017-09-22 LAB — LACTATE DEHYDROGENASE, PLEURAL OR PERITONEAL FLUID: LD, Fluid: 104 U/L — ABNORMAL HIGH (ref 3–23)

## 2017-09-22 LAB — BODY FLUID CELL COUNT WITH DIFFERENTIAL
Lymphs, Fluid: 71 %
Monocyte-Macrophage-Serous Fluid: 27 % — ABNORMAL LOW (ref 50–90)
NEUTROPHIL FLUID: 2 % (ref 0–25)
Total Nucleated Cell Count, Fluid: 1041 cu mm — ABNORMAL HIGH (ref 0–1000)

## 2017-09-22 MED ORDER — LIDOCAINE HCL 1 % IJ SOLN
INTRAMUSCULAR | Status: AC
  Filled 2017-09-22: qty 10

## 2017-09-22 NOTE — Procedures (Signed)
Ultrasound-guided diagnostic and therapeutic left thoracentesis performed yielding 1.7 liters of slightly hazy, yellow fluid. No immediate complications. Follow-up chest x-ray pending. A portion of the fluid was sent to the lab for preordered studies. Due to pt chest discomfort only the above amount of fluid was removed today.

## 2017-09-23 DIAGNOSIS — R531 Weakness: Secondary | ICD-10-CM | POA: Diagnosis not present

## 2017-09-23 DIAGNOSIS — Z9181 History of falling: Secondary | ICD-10-CM | POA: Diagnosis not present

## 2017-09-23 DIAGNOSIS — R41841 Cognitive communication deficit: Secondary | ICD-10-CM | POA: Diagnosis not present

## 2017-09-23 DIAGNOSIS — R278 Other lack of coordination: Secondary | ICD-10-CM | POA: Diagnosis not present

## 2017-09-23 DIAGNOSIS — N3946 Mixed incontinence: Secondary | ICD-10-CM | POA: Diagnosis not present

## 2017-09-23 DIAGNOSIS — C3490 Malignant neoplasm of unspecified part of unspecified bronchus or lung: Secondary | ICD-10-CM | POA: Diagnosis not present

## 2017-09-23 DIAGNOSIS — M6281 Muscle weakness (generalized): Secondary | ICD-10-CM | POA: Diagnosis not present

## 2017-09-23 DIAGNOSIS — R1312 Dysphagia, oropharyngeal phase: Secondary | ICD-10-CM | POA: Diagnosis not present

## 2017-09-23 DIAGNOSIS — R2681 Unsteadiness on feet: Secondary | ICD-10-CM | POA: Diagnosis not present

## 2017-09-23 LAB — PH, BODY FLUID: PH, BODY FLUID: 7.5

## 2017-09-23 MED FILL — TAGRISSO 80 MG TABLET: 80 | 30 days supply | Qty: 30 | Fill #2

## 2017-09-24 DIAGNOSIS — N3946 Mixed incontinence: Secondary | ICD-10-CM | POA: Diagnosis not present

## 2017-09-24 DIAGNOSIS — R2681 Unsteadiness on feet: Secondary | ICD-10-CM | POA: Diagnosis not present

## 2017-09-24 DIAGNOSIS — M6281 Muscle weakness (generalized): Secondary | ICD-10-CM | POA: Diagnosis not present

## 2017-09-24 DIAGNOSIS — R1312 Dysphagia, oropharyngeal phase: Secondary | ICD-10-CM | POA: Diagnosis not present

## 2017-09-24 DIAGNOSIS — R278 Other lack of coordination: Secondary | ICD-10-CM | POA: Diagnosis not present

## 2017-09-24 DIAGNOSIS — R41841 Cognitive communication deficit: Secondary | ICD-10-CM | POA: Diagnosis not present

## 2017-09-25 ENCOUNTER — Non-Acute Institutional Stay: Payer: Medicare Other

## 2017-09-25 DIAGNOSIS — M6281 Muscle weakness (generalized): Secondary | ICD-10-CM | POA: Diagnosis not present

## 2017-09-25 DIAGNOSIS — R2681 Unsteadiness on feet: Secondary | ICD-10-CM | POA: Diagnosis not present

## 2017-09-25 DIAGNOSIS — Z0000 Encounter for general adult medical examination without abnormal findings: Secondary | ICD-10-CM

## 2017-09-25 DIAGNOSIS — R278 Other lack of coordination: Secondary | ICD-10-CM | POA: Diagnosis not present

## 2017-09-25 DIAGNOSIS — R1312 Dysphagia, oropharyngeal phase: Secondary | ICD-10-CM | POA: Diagnosis not present

## 2017-09-25 DIAGNOSIS — N3946 Mixed incontinence: Secondary | ICD-10-CM | POA: Diagnosis not present

## 2017-09-25 DIAGNOSIS — R41841 Cognitive communication deficit: Secondary | ICD-10-CM | POA: Diagnosis not present

## 2017-09-25 LAB — BODY FLUID CULTURE: CULTURE: NO GROWTH

## 2017-09-25 NOTE — Patient Instructions (Signed)
[name] , Thank you for taking time to come for your Medicare Wellness Visit. I appreciate your ongoing commitment to your health goals. Please review the following plan we discussed and let me know if I can assist you in the future.   Screening recommendations/referrals: Colonoscopy excluded, over age 82 Recommended yearly ophthalmology/optometry visit for glaucoma screening and checkup Recommended yearly dental visit for hygiene and checkup  Vaccinations: Influenza vaccine up to date, due 2019 fall season Pneumococcal vaccine up to date. PNA 23 due 05/20/2018 Tdap vaccine up to date, due 2/2/22024 Shingles vaccine not in past records    Advanced directives: In chart  Conditions/risks identified: none  Next appointment: Mast 10/16/2017 @ 2:30pm  Preventive Care 65 Years and Older, Male Preventive care refers to lifestyle choices and visits with your health care provider that can promote health and wellness. What does preventive care include?  A yearly physical exam. This is also called an annual well check.  Dental exams once or twice a year.  Routine eye exams. Ask your health care provider how often you should have your eyes checked.  Personal lifestyle choices, including:  Daily care of your teeth and gums.  Regular physical activity.  Eating a healthy diet.  Avoiding tobacco and drug use.  Limiting alcohol use.  Practicing safe sex.  Taking low doses of aspirin every day.  Taking vitamin and mineral supplements as recommended by your health care provider. What happens during an annual well check? The services and screenings done by your health care provider during your annual well check will depend on your age, overall health, lifestyle risk factors, and family history of disease. Counseling  Your health care provider may ask you questions about your:  Alcohol use.  Tobacco use.  Drug use.  Emotional well-being.  Home and relationship  well-being.  Sexual activity.  Eating habits.  History of falls.  Memory and ability to understand (cognition).  Work and work Statistician. Screening  You may have the following tests or measurements:  Height, weight, and BMI.  Blood pressure.  Lipid and cholesterol levels. These may be checked every 5 years, or more frequently if you are over 64 years old.  Skin check.  Lung cancer screening. You may have this screening every year starting at age 66 if you have a 30-pack-year history of smoking and currently smoke or have quit within the past 15 years.  Fecal occult blood test (FOBT) of the stool. You may have this test every year starting at age 33.  Flexible sigmoidoscopy or colonoscopy. You may have a sigmoidoscopy every 5 years or a colonoscopy every 10 years starting at age 52.  Prostate cancer screening. Recommendations will vary depending on your family history and other risks.  Hepatitis C blood test.  Hepatitis B blood test.  Sexually transmitted disease (STD) testing.  Diabetes screening. This is done by checking your blood sugar (glucose) after you have not eaten for a while (fasting). You may have this done every 1-3 years.  Abdominal aortic aneurysm (AAA) screening. You may need this if you are a current or former smoker.  Osteoporosis. You may be screened starting at age 63 if you are at high risk. Talk with your health care provider about your test results, treatment options, and if necessary, the need for more tests. Vaccines  Your health care provider may recommend certain vaccines, such as:  Influenza vaccine. This is recommended every year.  Tetanus, diphtheria, and acellular pertussis (Tdap, Td) vaccine. You  may need a Td booster every 10 years.  Zoster vaccine. You may need this after age 82.  Pneumococcal 13-valent conjugate (PCV13) vaccine. One dose is recommended after age 79.  Pneumococcal polysaccharide (PPSV23) vaccine. One dose is  recommended after age 81. Talk to your health care provider about which screenings and vaccines you need and how often you need them. This information is not intended to replace advice given to you by your health care provider. Make sure you discuss any questions you have with your health care provider. Document Released: 06/02/2015 Document Revised: 01/24/2016 Document Reviewed: 03/07/2015 Elsevier Interactive Patient Education  2017 [place] Prevention in the Home Falls can cause injuries. They can happen to people of all ages. There are many things you can do to make your home safe and to help prevent falls. What can I do on the outside of my home?  Regularly fix the edges of walkways and driveways and fix any cracks.  Remove anything that might make you trip as you walk through a door, such as a raised step or threshold.  Trim any bushes or trees on the path to your home.  Use bright outdoor lighting.  Clear any walking paths of anything that might make someone trip, such as rocks or tools.  Regularly check to see if handrails are loose or broken. Make sure that both sides of any steps have handrails.  Any raised decks and porches should have guardrails on the edges.  Have any leaves, snow, or ice cleared regularly.  Use sand or salt on walking paths during winter.  Clean up any spills in your garage right away. This includes oil or grease spills. What can I do in the bathroom?  Use night lights.  Install grab bars by the toilet and in the tub and shower. Do not use towel bars as grab bars.  Use non-skid mats or decals in the tub or shower.  If you need to sit down in the shower, use a plastic, non-slip stool.  Keep the floor dry. Clean up any water that spills on the floor as soon as it happens.  Remove soap buildup in the tub or shower regularly.  Attach bath mats securely with double-sided non-slip rug tape.  Do not have throw rugs and other things on  the floor that can make you trip. What can I do in the bedroom?  Use night lights.  Make sure that you have a light by your bed that is easy to reach.  Do not use any sheets or blankets that are too big for your bed. They should not hang down onto the floor.  Have a firm chair that has side arms. You can use this for support while you get dressed.  Do not have throw rugs and other things on the floor that can make you trip. What can I do in the kitchen?  Clean up any spills right away.  Avoid walking on wet floors.  Keep items that you use a lot in easy-to-reach places.  If you need to reach something above you, use a strong step stool that has a grab bar.  Keep electrical cords out of the way.  Do not use floor polish or wax that makes floors slippery. If you must use wax, use non-skid floor wax.  Do not have throw rugs and other things on the floor that can make you trip. What can I do with my stairs?  Do not leave any items  on the stairs.  Make sure that there are handrails on both sides of the stairs and use them. Fix handrails that are broken or loose. Make sure that handrails are as long as the stairways.  Check any carpeting to make sure that it is firmly attached to the stairs. Fix any carpet that is loose or worn.  Avoid having throw rugs at the top or bottom of the stairs. If you do have throw rugs, attach them to the floor with carpet tape.  Make sure that you have a light switch at the top of the stairs and the bottom of the stairs. If you do not have them, ask someone to add them for you. What else can I do to help prevent falls?  Wear shoes that:  Do not have high heels.  Have rubber bottoms.  Are comfortable and fit you well.  Are closed at the toe. Do not wear sandals.  If you use a stepladder:  Make sure that it is fully opened. Do not climb a closed stepladder.  Make sure that both sides of the stepladder are locked into place.  Ask someone to  hold it for you, if possible.  Clearly mark and make sure that you can see:  Any grab bars or handrails.  First and last steps.  Where the edge of each step is.  Use tools that help you move around (mobility aids) if they are needed. These include:  Canes.  Walkers.  Scooters.  Crutches.  Turn on the lights when you go into a dark area. Replace any light bulbs as soon as they burn out.  Set up your furniture so you have a clear path. Avoid moving your furniture around.  If any of your floors are uneven, fix them.  If there are any pets around you, be aware of where they are.  Review your medicines with your doctor. Some medicines can make you feel dizzy. This can increase your chance of falling. Ask your doctor what other things that you can do to help prevent falls. This information is not intended to replace advice given to you by your health care provider. Make sure you discuss any questions you have with your health care provider. Document Released: 03/02/2009 Document Revised: 10/12/2015 Document Reviewed: 06/10/2014 Elsevier Interactive Patient Education  2017 [employer].

## 2017-09-25 NOTE — Progress Notes (Signed)
Subjective:   Scott [name] is a 84 y.o. male who presents for Medicare Annual/Subsequent preventive examination at [facility]    Objective:    Vitals: BP 135/72 (BP Location: Left Arm, Patient Position: Supine)   Pulse 67   Temp 98 F (36.7 C) (Oral)   Ht 5\' 6"  (1.676 m)   Wt 154 lb (69.9 kg)   BMI 24.86 kg/m   Body mass index is 24.86 kg/m.  Advanced Directives 09/25/2017 09/17/2017 09/15/2017 09/03/2017 08/12/2017 04/09/2017 04/07/2017  Does Patient Have a Medical Advance Directive? Yes Yes No Yes Yes Yes -  Type of Advance Directive Out of facility DNR (pink MOST or yellow form);Healthcare Power of [employer] of facility DNR (pink MOST or yellow form);Healthcare Power of [place];Living will Living will;Healthcare Power of [place];Living will  Does patient want to make changes to medical advance directive? No - Patient declined No - Patient declined No - Patient declined - Yes (MAU/Ambulatory/Procedural Areas - Information given) - -  Copy of [place] in Chart? Yes Yes - - No - copy requested - No - copy requested  Would patient like information on creating a medical advance directive? - - No - Patient declined - - - -  Pre-existing out of facility DNR order (yellow form or pink MOST form) Yellow form placed in chart (order not valid for inpatient use) Yellow form placed in chart (order not valid for inpatient use) - - - - -    Tobacco Social History   Tobacco Use  Smoking Status Never Smoker  Smokeless Tobacco Never Used     Counseling given: Not Answered   Clinical Intake:  Pre-visit preparation completed: No  Pain : No/denies pain     Nutritional Risks: None Diabetes: No     Interpreter Needed?: No  Information entered by :: Tyson Dense, RN  Past Medical History:  Diagnosis Date  . Anxiety   . Bradycardia    a. BB d/c'd in 2016.  [name] CAD  (coronary artery disease)    a. 1983 s/p CABG [facility] [place]);  b. 2007 NSTEMI->med Rx for distal LCX dzs; c. 09/2014 NSTEMI/Cath: LIMA->LAD & diag ok, native RCA dominant and patent.   VG->RCA 100, VG->LCX 100 -->Med Rx.  . CKD (chronic kidney disease), stage III ([place])   . Claudication ([place])    BLE  . Depression   . Diastolic dysfunction    a. 10/2014 Echo: F 60-65%, Gr1 DD, mild AS, mild AI, mild to mod MR, PASP 47mmHg.  [name] Dyspnea 2010   syndrome - extensive  . Erectile dysfunction   . GERD (gastroesophageal reflux disease)   . H/O hiatal hernia   . History of blood transfusion 2014  . Hx of adenomatous colonic polyps 2007  . Hyperlipidemia   . Hypertensive heart disease   . Inguinal hernia   . Iron deficiency anemia   . Shingles 1990's  . Sigmoid diverticulosis   . Upper GI bleeding 2014   Gastritis and possible duodenal ulcer seen on EGD, felt secondary to NSAIDs  . Urethral stricture 11/2008  . Vasovagal syncope 05/2006  . Weight loss    Past Surgical History:  Procedure Laterality Date  . CARDIAC CATHETERIZATION  2007   Left main 100%, RCA 70% ostial, all grafts were patent, EF 40-45 percent, distal circumflex subtotal after graft insertion, medical therapy   . CARDIAC CATHETERIZATION N/A 10/14/2014  Procedure: Left Heart Cath and Cors/Grafts Angiography;  Surgeon: Lorretta Harp, MD;  Location: [facility];  Service: Cardiovascular;  Laterality: N/A;  . CHOLECYSTECTOMY  1980's  . CORONARY ARTERY BYPASS GRAFT  1994    LIMA-LAD, SVG-D1, SVG-OM,  SVG-RCA; performed in [place], [place]  . ESOPHAGOGASTRODUODENOSCOPY N/A 03/25/2013   Procedure: ESOPHAGOGASTRODUODENOSCOPY (EGD);  Surgeon: Missy Sabins, MD;  gastritis, cannot rule out duodenal ulcer   . INGUINAL HERNIA REPAIR Left   . TRANSURETHRAL RESECTION OF PROSTATE     Family History  Problem Relation Age of Onset  . Hypertension Mother   . Heart disease Mother   . Heart disease Father   . Heart failure  Brother   . Heart failure Sister   . Heart failure Brother   . Heart attack Neg Hx   . Stroke Neg Hx    Social History   Socioeconomic History  . Marital status: Married    Spouse name: Not on file  . Number of children: Not on file  . Years of education: Not on file  . Highest education level: Not on file  Occupational History  . Occupation: Retired  Scientific laboratory technician  . Financial resource strain: Not hard at all  . Food insecurity:    Worry: Never true    Inability: Never true  . Transportation needs:    Medical: No    Non-medical: No  Tobacco Use  . Smoking status: Never Smoker  . Smokeless tobacco: Never Used  Substance and Sexual Activity  . Alcohol use: Yes    Alcohol/week: 4.2 oz    Types: 7 Glasses of wine per week    Comment: wine - daily - currently not drinking  . Drug use: No  . Sexual activity: Never    Birth control/protection: Abstinence  Lifestyle  . Physical activity:    Days per week: 0 days    Minutes per session: 0 min  . Stress: Not at all  Relationships  . Social connections:    Talks on phone: More than three times a week    Gets together: More than three times a week    Attends religious service: Never    Active member of club or organization: No    Attends meetings of clubs or organizations: Never    Relationship status: Married  Other Topics Concern  . Not on file  Social History Narrative   He lives in [facility] independent living, with his wife.    Outpatient Encounter Medications as of 09/25/2017  Medication Sig  . acetaminophen (TYLENOL) 500 MG tablet Take 500 mg by mouth every 6 (six) hours as needed for mild pain or moderate pain.  [name] albuterol (PROAIR HFA) 108 (90 Base) MCG/ACT inhaler Inhale 2 puffs into the lungs every 6 (six) hours as needed for wheezing or shortness of breath.  [name] amLODipine (NORVASC) 5 MG tablet TAKE 1 TABLET BY MOUTH  DAILY  . buPROPion (WELLBUTRIN SR) 150 MG 12 hr tablet Take 150 mg by mouth daily.  [name]  FERROCITE 324 MG TABS tablet TAKE 1 TABLET BY MOUTH QOD  . isosorbide mononitrate (IMDUR) 30 MG 24 hr tablet Take 30 mg by mouth daily.  . nitroGLYCERIN (NITROSTAT) 0.4 MG SL tablet Place 1 tablet (0.4 mg total) under the tongue every 5 (five) minutes x 3 doses as needed for chest pain.  . Omega-3 Fatty Acids (FISH OIL PO) Take 1 capsule by mouth daily.  . OXYGEN Inhale 2 L into the  lungs continuous.  . pantoprazole (PROTONIX) 40 MG tablet Take 1 tablet (40 mg total) by mouth 2 (two) times daily before a meal.  . pregabalin (LYRICA) 50 MG capsule Take 50 mg by mouth at bedtime.  [name] TAGRISSO 80 MG tablet TAKE 1 TABLET (80 MG TOTAL) BY MOUTH DAILY.  . vitamin B-12 (CYANOCOBALAMIN) 1000 MCG tablet Take 1,000 mcg by mouth every evening.    No facility-administered encounter medications on file as of 09/25/2017.     Activities of Daily Living In your present state of health, do you have any difficulty performing the following activities: 09/25/2017  Hearing? Y  Vision? N  Difficulty concentrating or making decisions? Y  Walking or climbing stairs? Y  Dressing or bathing? N  Doing errands, shopping? N  Preparing Food and eating ? N  Using the Toilet? N  In the past six months, have you accidently leaked urine? N  Do you have problems with loss of bowel control? N  Managing your Medications? Y  Managing your Finances? Y  Housekeeping or managing your Housekeeping? Y  Some recent data might be hidden    Patient Care Team: [name], MD as PCP - General (Internal Medicine) Mast, [name], NP as Nurse Practitioner (Internal Medicine)   Assessment:   This is a routine wellness examination for [place].  Exercise Activities and Dietary recommendations Current Exercise Habits: The patient does not participate in regular exercise at present, Exercise limited by: respiratory conditions(s)  Goals    None      Fall Risk Fall Risk  09/25/2017 04/07/2017  Falls in the past year? Yes No  Number  falls in past yr: 2 or more -  Injury with Fall? No -   Is the patient's home free of loose throw rugs in walkways, pet beds, electrical cords, etc?   yes      Grab bars in the bathroom? yes      Handrails on the stairs?   yes      Adequate lighting?   yes  Timed Get Up and Go Performed: 26 seconds, fall risk  Depression Screen PHQ 2/9 Scores 09/25/2017 04/07/2017  PHQ - 2 Score 1 0    Cognitive Function MMSE - Mini Mental State Exam 09/25/2017  Orientation to time 0  Orientation to Place 3  Registration 3  Attention/ Calculation 3  Recall 0  Language- name 2 objects 2  Language- repeat 1  Language- follow 3 step command 3  Language- read & follow direction 1  Write a sentence 1  Copy design 0  Total score 17        Immunization History  Administered Date(s) Administered  . Influenza-Unspecified 02/17/2013, 05/20/2017  . Pneumococcal-Unspecified 05/20/2017  . Tdap 07/11/2012    Qualifies for Shingles Vaccine? Not in past records  Screening Tests Health Maintenance  Topic Date Due  . INFLUENZA VACCINE  12/18/2017  . PNA vac Low Risk Adult (2 of 2 - PCV13) 05/20/2018  . TETANUS/TDAP  07/11/2022   Cancer Screenings: Lung: Low Dose CT Chest recommended if Age 47-80 years, 30 pack-year currently smoking OR have quit w/in 15years. Patient does not qualify. Colorectal: up to date  Additional Screenings:  Hepatitis C Screening:declined      Plan:    I have personally reviewed and addressed the Medicare Annual Wellness questionnaire and have noted the following in the patient's chart:  A. Medical and social history B. Use of alcohol, tobacco or illicit drugs  C. Current medications  and supplements D. Functional ability and status E.  Nutritional status F.  Physical activity G. Advance directives H. List of other physicians I.  Hospitalizations, surgeries, and ER visits in previous 12 months J.  [place] to include hearing, vision, cognitive,  depression L. Referrals and appointments - none  In addition, I have reviewed and discussed with patient certain preventive protocols, quality metrics, and best practice recommendations. A written personalized care plan for preventive services as well as general preventive health recommendations were provided to patient.  See attached scanned questionnaire for additional information.   Signed,   Tyson Dense, RN Nurse Health Advisor  Patient Concerns: None

## 2017-09-26 DIAGNOSIS — M6281 Muscle weakness (generalized): Secondary | ICD-10-CM | POA: Diagnosis not present

## 2017-09-26 DIAGNOSIS — R41841 Cognitive communication deficit: Secondary | ICD-10-CM | POA: Diagnosis not present

## 2017-09-26 DIAGNOSIS — N3946 Mixed incontinence: Secondary | ICD-10-CM | POA: Diagnosis not present

## 2017-09-26 DIAGNOSIS — R2681 Unsteadiness on feet: Secondary | ICD-10-CM | POA: Diagnosis not present

## 2017-09-26 DIAGNOSIS — R278 Other lack of coordination: Secondary | ICD-10-CM | POA: Diagnosis not present

## 2017-09-26 DIAGNOSIS — R1312 Dysphagia, oropharyngeal phase: Secondary | ICD-10-CM | POA: Diagnosis not present

## 2017-09-30 ENCOUNTER — Ambulatory Visit (INDEPENDENT_AMBULATORY_CARE_PROVIDER_SITE_OTHER): Payer: Medicare Other | Admitting: Interventional Cardiology

## 2017-09-30 ENCOUNTER — Encounter: Payer: Self-pay | Admitting: Interventional Cardiology

## 2017-09-30 VITALS — BP 124/66 | HR 78 | Ht 66.0 in | Wt 152.0 lb

## 2017-09-30 DIAGNOSIS — R278 Other lack of coordination: Secondary | ICD-10-CM | POA: Diagnosis not present

## 2017-09-30 DIAGNOSIS — I25810 Atherosclerosis of coronary artery bypass graft(s) without angina pectoris: Secondary | ICD-10-CM | POA: Diagnosis not present

## 2017-09-30 DIAGNOSIS — R41841 Cognitive communication deficit: Secondary | ICD-10-CM | POA: Diagnosis not present

## 2017-09-30 DIAGNOSIS — E782 Mixed hyperlipidemia: Secondary | ICD-10-CM

## 2017-09-30 DIAGNOSIS — I10 Essential (primary) hypertension: Secondary | ICD-10-CM | POA: Diagnosis not present

## 2017-09-30 DIAGNOSIS — I25700 Atherosclerosis of coronary artery bypass graft(s), unspecified, with unstable angina pectoris: Secondary | ICD-10-CM | POA: Diagnosis not present

## 2017-09-30 DIAGNOSIS — N3946 Mixed incontinence: Secondary | ICD-10-CM | POA: Diagnosis not present

## 2017-09-30 DIAGNOSIS — R1312 Dysphagia, oropharyngeal phase: Secondary | ICD-10-CM | POA: Diagnosis not present

## 2017-09-30 DIAGNOSIS — R0609 Other forms of dyspnea: Secondary | ICD-10-CM | POA: Diagnosis not present

## 2017-09-30 DIAGNOSIS — M6281 Muscle weakness (generalized): Secondary | ICD-10-CM | POA: Diagnosis not present

## 2017-09-30 DIAGNOSIS — R2681 Unsteadiness on feet: Secondary | ICD-10-CM | POA: Diagnosis not present

## 2017-09-30 NOTE — Patient Instructions (Signed)

## 2017-09-30 NOTE — Progress Notes (Signed)
Cardiology Office Note   Date:  09/30/2017   ID:  Scott [name], DOB [birth year], MRN [id number]  PCP:  [name], MD    No chief complaint on file.  CAD  Wt Readings from Last 3 Encounters:  09/30/17 152 lb (68.9 kg)  09/25/17 154 lb (69.9 kg)  09/17/17 154 lb (69.9 kg)       History of Present Illness: [name] is a 84 y.o. male   Who has history of CAD status [name] CABG in 38 in [place], [place]. He was admitted with a non-STEMI in 2007 and grafts were patent at that time but there was distal disease in the circumflex and is treated medically. He EF was 40-45% that time. He returned with a non-STEMI on 10/13/14 and underwent cardiac catheterization by Dr. [name]. He had a patent LIMA to the LAD and diagonal branch and patent dominant RCA. He had occluded vein grafts. Medical therapy was recommended. Toprol was stopped due to bradycardia.   Decreased libido reported at the last visit in 2018.   He is living at [facility].  He uses a walker.  He has not fallen.    He is being treated for lung cancer.  He has had fluid around the lung.  His care is palliative in intent based on 04/17/17 note from Dr. Irene Limbo.  DOE improved after thoracentesis.   Denies : Chest pain. Dizziness. Leg edema. Nitroglycerin use. Orthopnea. Palpitations. Paroxysmal nocturnal dyspnea. Syncope.      Past Medical History:  Diagnosis Date  . Anxiety   . Bradycardia    a. BB d/c'd in 2016.  [name] CAD (coronary artery disease)    a. 1983 s/p CABG [facility], [place]);  b. 2007 NSTEMI->med Rx for distal LCX dzs; c. 09/2014 NSTEMI/Cath: LIMA->LAD & diag ok, native RCA dominant and patent.   VG->RCA 100, VG->LCX 100 -->Med Rx.  . CKD (chronic kidney disease), stage III ([place])   . Claudication ([place])    BLE  . Depression   . Diastolic dysfunction    a. 10/2014 Echo: F 60-65%, Gr1 DD, mild AS, mild AI, mild to mod MR, PASP 53mmHg.  [name] Dyspnea 2010   syndrome - extensive  . Erectile  dysfunction   . GERD (gastroesophageal reflux disease)   . H/O hiatal hernia   . History of blood transfusion 2014  . Hx of adenomatous colonic polyps 2007  . Hyperlipidemia   . Hypertensive heart disease   . Inguinal hernia   . Iron deficiency anemia   . Shingles 1990's  . Sigmoid diverticulosis   . Upper GI bleeding 2014   Gastritis and possible duodenal ulcer seen on EGD, felt secondary to NSAIDs  . Urethral stricture 11/2008  . Vasovagal syncope 05/2006  . Weight loss     Past Surgical History:  Procedure Laterality Date  . CARDIAC CATHETERIZATION  2007   Left main 100%, RCA 70% ostial, all grafts were patent, EF 40-45 percent, distal circumflex subtotal after graft insertion, medical therapy   . CARDIAC CATHETERIZATION N/A 10/14/2014   Procedure: Left Heart Cath and Cors/Grafts Angiography;  Surgeon: Lorretta Harp, MD;  Location: [place] [facility];  Service: Cardiovascular;  Laterality: N/A;  . CHOLECYSTECTOMY  1980's  . CORONARY ARTERY BYPASS GRAFT  1994    LIMA-LAD, SVG-D1, SVG-OM,  SVG-RCA; performed in [place], [place]  . ESOPHAGOGASTRODUODENOSCOPY N/A 03/25/2013   Procedure: ESOPHAGOGASTRODUODENOSCOPY (EGD);  Surgeon: Missy Sabins, MD;  gastritis, cannot rule out duodenal ulcer   .  INGUINAL HERNIA REPAIR Left   . TRANSURETHRAL RESECTION OF PROSTATE       Current Outpatient Medications  Medication Sig Dispense Refill  . acetaminophen (TYLENOL) 500 MG tablet Take 500 mg by mouth every 6 (six) hours as needed for mild pain or moderate pain.    [name] albuterol (PROAIR HFA) 108 (90 Base) MCG/ACT inhaler Inhale 2 puffs into the lungs every 6 (six) hours as needed for wheezing or shortness of breath.    [name] amLODipine (NORVASC) 5 MG tablet TAKE 1 TABLET BY MOUTH  DAILY 90 tablet 0  . BREO ELLIPTA 100-25 MCG/INH AEPB Inhale 1 puff into the lungs daily.    [name] buPROPion (WELLBUTRIN SR) 150 MG 12 hr tablet Take 150 mg by mouth daily.    [name] Desvenlafaxine Succinate ER 25 MG TB24  Take 25 mg by mouth daily.    [name] FERROCITE 324 MG TABS tablet TAKE 1 TABLET BY MOUTH QOD  11  . isosorbide mononitrate (IMDUR) 30 MG 24 hr tablet Take 30 mg by mouth daily.    . nitroGLYCERIN (NITROSTAT) 0.4 MG SL tablet Place 1 tablet (0.4 mg total) under the tongue every 5 (five) minutes x 3 doses as needed for chest pain. 25 tablet 4  . Omega-3 Fatty Acids (FISH OIL PO) Take 1 capsule by mouth daily.    . OXYGEN Inhale 2 L into the lungs continuous.    . pantoprazole (PROTONIX) 40 MG tablet Take 1 tablet (40 mg total) by mouth 2 (two) times daily before a meal. 60 tablet 3  . pregabalin (LYRICA) 50 MG capsule Take 50 mg by mouth at bedtime.    [name] TAGRISSO 80 MG tablet TAKE 1 TABLET (80 MG TOTAL) BY MOUTH DAILY. 30 tablet 2  . vitamin B-12 (CYANOCOBALAMIN) 1000 MCG tablet Take 1,000 mcg by mouth every evening.      No current facility-administered medications for this visit.     Allergies:   Patient has no known allergies.    Social History:  The patient  reports that he has never smoked. He has never used smokeless tobacco. He reports that he drinks about 4.2 oz of alcohol per week. He reports that he does not use drugs.   Family History:  The patient's family history includes Heart disease in his father and mother; Heart failure in his brother, brother, and sister; Hypertension in his mother.    ROS:  Please see the history of present illness.   Otherwise, review of systems are positive for shortness of breath related to pleural effusion.   All other systems are reviewed and negative.    PHYSICAL EXAM: VS:  BP 124/66   Pulse 78   Ht 5\' 6"  (1.676 m)   Wt 152 lb (68.9 kg)   SpO2 95%   BMI 24.53 kg/m  , BMI Body mass index is 24.53 kg/m. GEN: Well nourished, well developed, in no acute distress  HEENT: normal  Neck: no JVD, carotid bruits, or masses Cardiac: RRR; no murmurs, rubs, or gallops,no edema  Respiratory:  clear to auscultation bilaterally, normal work of breathing GI:  soft, nontender, nondistended, + BS MS: no deformity or atrophy  Skin: warm and dry, no rash Neuro:  Strength and sensation are intact Psych: euthymic mood, full affect    Recent Labs: 08/01/2017: Magnesium 2.4 08/14/2017: TSH 2.530 09/15/2017: ALT 28; BUN 33; Creatinine 2.06; Hemoglobin 12.1; Platelet Count 153; Potassium 4.3; Sodium 141   Lipid Panel    Component Value Date/Time  CHOL 175 10/14/2014 0314   TRIG 108 10/14/2014 0314   HDL 39 (L) 10/14/2014 0314   CHOLHDL 4.5 10/14/2014 0314   VLDL 22 10/14/2014 0314   LDLCALC 114 (H) 10/14/2014 0314     Other studies Reviewed: Additional studies/ records that were reviewed today with results demonstrating: LDL 139, triglycerides 171 in January 2018.   ASSESSMENT AND PLAN:  1. CAD: No clear angina.  No plans for any other cardiac testing at this time.  Continue medical therapy. 2. HTN: Blood pressure well controlled.  Continue current medications. 3. Hyperlipidemia: LDL above target.  However, given his lung cancer and current palliative care, would not aggressively pursue statin therapy.  He has had some subjective aches in the past when on statins. 4. Dyspnea on exertion response to thoracentesis.  This is managed per oncology.  I encouraged him to be careful and avoid falling.  He does use his walker regularly. 5. Chronic kidney disease: Try to avoid nephrotoxins.    Current medicines are reviewed at length with the patient today.  The patient concerns regarding his medicines were addressed.  The following changes have been made:  No change  Labs/ tests ordered today include:  No orders of the defined types were placed in this encounter.   Recommend 150 minutes/week of aerobic exercise Low fat, low carb, high fiber diet recommended  Disposition:   FU in 1 year   Signed, Larae Grooms, MD  09/30/2017 11:27 AM    [facility] [place], [place], [place]  [address] Phone: [phone]; Fax: [phone]

## 2017-10-01 DIAGNOSIS — R2681 Unsteadiness on feet: Secondary | ICD-10-CM | POA: Diagnosis not present

## 2017-10-01 DIAGNOSIS — R1312 Dysphagia, oropharyngeal phase: Secondary | ICD-10-CM | POA: Diagnosis not present

## 2017-10-01 DIAGNOSIS — R278 Other lack of coordination: Secondary | ICD-10-CM | POA: Diagnosis not present

## 2017-10-01 DIAGNOSIS — R41841 Cognitive communication deficit: Secondary | ICD-10-CM | POA: Diagnosis not present

## 2017-10-01 DIAGNOSIS — M6281 Muscle weakness (generalized): Secondary | ICD-10-CM | POA: Diagnosis not present

## 2017-10-01 DIAGNOSIS — N3946 Mixed incontinence: Secondary | ICD-10-CM | POA: Diagnosis not present

## 2017-10-02 DIAGNOSIS — M6281 Muscle weakness (generalized): Secondary | ICD-10-CM | POA: Diagnosis not present

## 2017-10-02 DIAGNOSIS — R41841 Cognitive communication deficit: Secondary | ICD-10-CM | POA: Diagnosis not present

## 2017-10-02 DIAGNOSIS — N3946 Mixed incontinence: Secondary | ICD-10-CM | POA: Diagnosis not present

## 2017-10-02 DIAGNOSIS — R1312 Dysphagia, oropharyngeal phase: Secondary | ICD-10-CM | POA: Diagnosis not present

## 2017-10-02 DIAGNOSIS — R278 Other lack of coordination: Secondary | ICD-10-CM | POA: Diagnosis not present

## 2017-10-02 DIAGNOSIS — R2681 Unsteadiness on feet: Secondary | ICD-10-CM | POA: Diagnosis not present

## 2017-10-03 DIAGNOSIS — M6281 Muscle weakness (generalized): Secondary | ICD-10-CM | POA: Diagnosis not present

## 2017-10-03 DIAGNOSIS — R41841 Cognitive communication deficit: Secondary | ICD-10-CM | POA: Diagnosis not present

## 2017-10-03 DIAGNOSIS — N3946 Mixed incontinence: Secondary | ICD-10-CM | POA: Diagnosis not present

## 2017-10-03 DIAGNOSIS — R1312 Dysphagia, oropharyngeal phase: Secondary | ICD-10-CM | POA: Diagnosis not present

## 2017-10-03 DIAGNOSIS — R278 Other lack of coordination: Secondary | ICD-10-CM | POA: Diagnosis not present

## 2017-10-03 DIAGNOSIS — R2681 Unsteadiness on feet: Secondary | ICD-10-CM | POA: Diagnosis not present

## 2017-10-05 ENCOUNTER — Other Ambulatory Visit: Payer: Self-pay

## 2017-10-05 ENCOUNTER — Emergency Department (HOSPITAL_COMMUNITY): Payer: Medicare Other

## 2017-10-05 ENCOUNTER — Inpatient Hospital Stay (HOSPITAL_COMMUNITY)
Admission: EM | Admit: 2017-10-05 | Discharge: 2017-10-08 | DRG: 281 | Disposition: A | Discharge status: Skilled Nursing Facility | Payer: Medicare Other | Attending: Internal Medicine | Admitting: Internal Medicine

## 2017-10-05 ENCOUNTER — Encounter (HOSPITAL_COMMUNITY): Payer: Self-pay | Admitting: Emergency Medicine

## 2017-10-05 DIAGNOSIS — D509 Iron deficiency anemia, unspecified: Secondary | ICD-10-CM | POA: Diagnosis present

## 2017-10-05 DIAGNOSIS — I252 Old myocardial infarction: Secondary | ICD-10-CM | POA: Diagnosis not present

## 2017-10-05 DIAGNOSIS — Z9889 Other specified postprocedural states: Secondary | ICD-10-CM

## 2017-10-05 DIAGNOSIS — F419 Anxiety disorder, unspecified: Secondary | ICD-10-CM | POA: Diagnosis present

## 2017-10-05 DIAGNOSIS — Z9049 Acquired absence of other specified parts of digestive tract: Secondary | ICD-10-CM | POA: Diagnosis not present

## 2017-10-05 DIAGNOSIS — Z66 Do not resuscitate: Secondary | ICD-10-CM | POA: Diagnosis not present

## 2017-10-05 DIAGNOSIS — N183 Chronic kidney disease, stage 3 unspecified: Secondary | ICD-10-CM

## 2017-10-05 DIAGNOSIS — Z7189 Other specified counseling: Secondary | ICD-10-CM | POA: Diagnosis not present

## 2017-10-05 DIAGNOSIS — R0602 Shortness of breath: Secondary | ICD-10-CM | POA: Diagnosis not present

## 2017-10-05 DIAGNOSIS — I130 Hypertensive heart and chronic kidney disease with heart failure and stage 1 through stage 4 chronic kidney disease, or unspecified chronic kidney disease: Secondary | ICD-10-CM | POA: Diagnosis not present

## 2017-10-05 DIAGNOSIS — I214 Non-ST elevation (NSTEMI) myocardial infarction: Secondary | ICD-10-CM | POA: Diagnosis not present

## 2017-10-05 DIAGNOSIS — Z951 Presence of aortocoronary bypass graft: Secondary | ICD-10-CM | POA: Diagnosis not present

## 2017-10-05 DIAGNOSIS — R55 Syncope and collapse: Secondary | ICD-10-CM | POA: Diagnosis not present

## 2017-10-05 DIAGNOSIS — R079 Chest pain, unspecified: Secondary | ICD-10-CM | POA: Diagnosis not present

## 2017-10-05 DIAGNOSIS — Z515 Encounter for palliative care: Secondary | ICD-10-CM | POA: Diagnosis not present

## 2017-10-05 DIAGNOSIS — Z86010 Personal history of colonic polyps: Secondary | ICD-10-CM | POA: Diagnosis not present

## 2017-10-05 DIAGNOSIS — B029 Zoster without complications: Secondary | ICD-10-CM | POA: Diagnosis not present

## 2017-10-05 DIAGNOSIS — E785 Hyperlipidemia, unspecified: Secondary | ICD-10-CM | POA: Diagnosis present

## 2017-10-05 DIAGNOSIS — J910 Malignant pleural effusion: Secondary | ICD-10-CM | POA: Diagnosis not present

## 2017-10-05 DIAGNOSIS — I739 Peripheral vascular disease, unspecified: Secondary | ICD-10-CM | POA: Diagnosis present

## 2017-10-05 DIAGNOSIS — I493 Ventricular premature depolarization: Secondary | ICD-10-CM | POA: Diagnosis present

## 2017-10-05 DIAGNOSIS — N184 Chronic kidney disease, stage 4 (severe): Secondary | ICD-10-CM | POA: Diagnosis not present

## 2017-10-05 DIAGNOSIS — R278 Other lack of coordination: Secondary | ICD-10-CM | POA: Diagnosis not present

## 2017-10-05 DIAGNOSIS — Z8249 Family history of ischemic heart disease and other diseases of the circulatory system: Secondary | ICD-10-CM | POA: Diagnosis not present

## 2017-10-05 DIAGNOSIS — N400 Enlarged prostate without lower urinary tract symptoms: Secondary | ICD-10-CM | POA: Diagnosis not present

## 2017-10-05 DIAGNOSIS — K219 Gastro-esophageal reflux disease without esophagitis: Secondary | ICD-10-CM | POA: Diagnosis not present

## 2017-10-05 DIAGNOSIS — I5032 Chronic diastolic (congestive) heart failure: Secondary | ICD-10-CM | POA: Diagnosis present

## 2017-10-05 DIAGNOSIS — R2681 Unsteadiness on feet: Secondary | ICD-10-CM | POA: Diagnosis not present

## 2017-10-05 DIAGNOSIS — Z9981 Dependence on supplemental oxygen: Secondary | ICD-10-CM | POA: Diagnosis not present

## 2017-10-05 DIAGNOSIS — C799 Secondary malignant neoplasm of unspecified site: Secondary | ICD-10-CM | POA: Diagnosis not present

## 2017-10-05 DIAGNOSIS — Z79899 Other long term (current) drug therapy: Secondary | ICD-10-CM | POA: Diagnosis not present

## 2017-10-05 DIAGNOSIS — I10 Essential (primary) hypertension: Secondary | ICD-10-CM | POA: Diagnosis not present

## 2017-10-05 DIAGNOSIS — F418 Other specified anxiety disorders: Secondary | ICD-10-CM | POA: Diagnosis not present

## 2017-10-05 DIAGNOSIS — R41841 Cognitive communication deficit: Secondary | ICD-10-CM | POA: Diagnosis not present

## 2017-10-05 DIAGNOSIS — J90 Pleural effusion, not elsewhere classified: Secondary | ICD-10-CM

## 2017-10-05 DIAGNOSIS — F329 Major depressive disorder, single episode, unspecified: Secondary | ICD-10-CM | POA: Diagnosis not present

## 2017-10-05 DIAGNOSIS — R410 Disorientation, unspecified: Secondary | ICD-10-CM | POA: Diagnosis not present

## 2017-10-05 DIAGNOSIS — R531 Weakness: Secondary | ICD-10-CM | POA: Diagnosis not present

## 2017-10-05 DIAGNOSIS — C3490 Malignant neoplasm of unspecified part of unspecified bronchus or lung: Secondary | ICD-10-CM | POA: Diagnosis not present

## 2017-10-05 DIAGNOSIS — C3412 Malignant neoplasm of upper lobe, left bronchus or lung: Secondary | ICD-10-CM | POA: Diagnosis present

## 2017-10-05 DIAGNOSIS — R0600 Dyspnea, unspecified: Secondary | ICD-10-CM | POA: Diagnosis not present

## 2017-10-05 DIAGNOSIS — I2510 Atherosclerotic heart disease of native coronary artery without angina pectoris: Secondary | ICD-10-CM | POA: Diagnosis present

## 2017-10-05 DIAGNOSIS — R1312 Dysphagia, oropharyngeal phase: Secondary | ICD-10-CM | POA: Diagnosis not present

## 2017-10-05 DIAGNOSIS — E611 Iron deficiency: Secondary | ICD-10-CM | POA: Diagnosis not present

## 2017-10-05 DIAGNOSIS — K5732 Diverticulitis of large intestine without perforation or abscess without bleeding: Secondary | ICD-10-CM | POA: Diagnosis not present

## 2017-10-05 DIAGNOSIS — R5381 Other malaise: Secondary | ICD-10-CM | POA: Diagnosis present

## 2017-10-05 DIAGNOSIS — M6281 Muscle weakness (generalized): Secondary | ICD-10-CM | POA: Diagnosis not present

## 2017-10-05 HISTORY — DX: Malignant neoplasm of unspecified part of unspecified bronchus or lung: C34.90

## 2017-10-05 LAB — BASIC METABOLIC PANEL
Anion gap: 8 (ref 5–15)
BUN: 29 mg/dL — ABNORMAL HIGH (ref 6–20)
CO2: 22 mmol/L (ref 22–32)
Calcium: 8.5 mg/dL — ABNORMAL LOW (ref 8.9–10.3)
Chloride: 109 mmol/L (ref 101–111)
Creatinine, Ser: 1.9 mg/dL — ABNORMAL HIGH (ref 0.61–1.24)
GFR calc Af Amer: 34 mL/min — ABNORMAL LOW (ref >60)
GFR calc non Af Amer: 29 mL/min — ABNORMAL LOW (ref >60)
Glucose, Bld: 106 mg/dL — ABNORMAL HIGH (ref 65–99)
Potassium: 5.5 mmol/L — ABNORMAL HIGH (ref 3.5–5.1)
Sodium: 139 mmol/L (ref 135–145)

## 2017-10-05 LAB — CBC
HCT: 37.2 % — ABNORMAL LOW (ref 39.0–52.0)
Hemoglobin: 11.6 g/dL — ABNORMAL LOW (ref 13.0–17.0)
MCH: 27.4 pg (ref 26.0–34.0)
MCHC: 31.2 g/dL (ref 30.0–36.0)
MCV: 87.7 fL (ref 78.0–100.0)
Platelets: 163 10*3/uL (ref 150–400)
RBC: 4.24 MIL/uL (ref 4.22–5.81)
RDW: 14.6 % (ref 11.5–15.5)
WBC: 8.4 10*3/uL (ref 4.0–10.5)

## 2017-10-05 LAB — I-STAT TROPONIN, ED: Troponin i, poc: 5.44 ng/mL (ref 0.00–0.08)

## 2017-10-05 LAB — TROPONIN I
Troponin I: 10.07 ng/mL (ref <0.03)
Troponin I: 10.29 ng/mL (ref <0.03)

## 2017-10-05 LAB — HEPARIN LEVEL (UNFRACTIONATED): HEPARIN UNFRACTIONATED: 0.59 [IU]/mL (ref 0.30–0.70)

## 2017-10-05 MED ORDER — VENLAFAXINE HCL ER 37.5 MG PO CP24
37.5000 mg | ORAL_CAPSULE | Freq: Every day | ORAL | Status: DC
  Administered 2017-10-06 – 2017-10-07 (×2): 37.5 mg via ORAL
  Filled 2017-10-05 (×3): qty 1

## 2017-10-05 MED ORDER — ONDANSETRON HCL 4 MG/2ML IJ SOLN: 4.0000 mg | Freq: Four times a day (QID) | INTRAMUSCULAR | Status: DC | PRN

## 2017-10-05 MED ORDER — ACETAMINOPHEN 650 MG RE SUPP: 650.0000 mg | Freq: Four times a day (QID) | RECTAL | Status: DC | PRN

## 2017-10-05 MED ORDER — BUPROPION HCL ER (SR) 150 MG PO TB12
150.0000 mg | ORAL_TABLET | Freq: Every day | ORAL | Status: DC
  Administered 2017-10-05 – 2017-10-08 (×4): 150 mg via ORAL
  Filled 2017-10-05 (×4): qty 1

## 2017-10-05 MED ORDER — HEPARIN BOLUS VIA INFUSION
3000.0000 [IU] | Freq: Once | INTRAVENOUS | Status: AC
  Administered 2017-10-05: 3000 [IU] via INTRAVENOUS
  Filled 2017-10-05: qty 3000

## 2017-10-05 MED ORDER — SODIUM CHLORIDE 0.9% FLUSH: 3.0000 mL | INTRAVENOUS | Status: DC | PRN

## 2017-10-05 MED ORDER — ASPIRIN EC 81 MG PO TBEC
81.0000 mg | DELAYED_RELEASE_TABLET | Freq: Every day | ORAL | Status: DC
  Administered 2017-10-06 – 2017-10-08 (×3): 81 mg via ORAL
  Filled 2017-10-05 (×3): qty 1

## 2017-10-05 MED ORDER — ACETAMINOPHEN 325 MG PO TABS
650.0000 mg | ORAL_TABLET | Freq: Four times a day (QID) | ORAL | Status: DC | PRN
  Administered 2017-10-05: 650 mg via ORAL
  Filled 2017-10-05: qty 2

## 2017-10-05 MED ORDER — ASPIRIN 81 MG PO CHEW: 324.0000 mg | CHEWABLE_TABLET | Freq: Once | ORAL | Status: DC

## 2017-10-05 MED ORDER — SODIUM CHLORIDE 0.9% FLUSH
3.0000 mL | Freq: Two times a day (BID) | INTRAVENOUS | Status: DC
  Administered 2017-10-05 – 2017-10-06 (×2): 3 mL via INTRAVENOUS

## 2017-10-05 MED ORDER — ALBUTEROL SULFATE (2.5 MG/3ML) 0.083% IN NEBU: 2.5000 mg | INHALATION_SOLUTION | RESPIRATORY_TRACT | Status: DC | PRN

## 2017-10-05 MED ORDER — HEPARIN (PORCINE) IN NACL 100-0.45 UNIT/ML-% IJ SOLN
850.0000 [IU]/h | INTRAMUSCULAR | Status: DC
  Administered 2017-10-05: 850 [IU]/h via INTRAVENOUS
  Filled 2017-10-05 (×3): qty 250

## 2017-10-05 MED ORDER — FLUTICASONE FUROATE-VILANTEROL 100-25 MCG/INH IN AEPB
1.0000 | INHALATION_SPRAY | Freq: Every day | RESPIRATORY_TRACT | Status: DC
  Filled 2017-10-05: qty 28

## 2017-10-05 MED ORDER — SODIUM CHLORIDE 0.9% FLUSH
3.0000 mL | Freq: Two times a day (BID) | INTRAVENOUS | Status: DC
  Administered 2017-10-06 – 2017-10-07 (×2): 3 mL via INTRAVENOUS

## 2017-10-05 MED ORDER — PANTOPRAZOLE SODIUM 40 MG PO TBEC
40.0000 mg | DELAYED_RELEASE_TABLET | Freq: Every day | ORAL | Status: DC
  Administered 2017-10-06 – 2017-10-08 (×3): 40 mg via ORAL
  Filled 2017-10-05 (×3): qty 1

## 2017-10-05 MED ORDER — CLOPIDOGREL BISULFATE 75 MG PO TABS: 75.0000 mg | ORAL_TABLET | Freq: Every day | ORAL | Status: DC

## 2017-10-05 MED ORDER — FLUTICASONE FUROATE-VILANTEROL 100-25 MCG/INH IN AEPB
1.0000 | INHALATION_SPRAY | Freq: Every day | RESPIRATORY_TRACT | Status: DC
  Administered 2017-10-06 – 2017-10-08 (×3): 1 via RESPIRATORY_TRACT
  Filled 2017-10-05: qty 28

## 2017-10-05 MED ORDER — SODIUM CHLORIDE 0.9 % IV SOLN: 250.0000 mL | INTRAVENOUS | Status: DC | PRN

## 2017-10-05 MED ORDER — DESVENLAFAXINE SUCCINATE ER 25 MG PO TB24
25.0000 mg | ORAL_TABLET | Freq: Every day | ORAL | Status: DC
Start: 2017-10-05 — End: 2017-10-05

## 2017-10-05 MED ORDER — NITROGLYCERIN 0.4 MG SL SUBL: 0.4000 mg | SUBLINGUAL_TABLET | SUBLINGUAL | Status: DC | PRN

## 2017-10-05 MED ORDER — ISOSORBIDE MONONITRATE ER 30 MG PO TB24
30.0000 mg | ORAL_TABLET | Freq: Every day | ORAL | Status: DC
  Administered 2017-10-05 – 2017-10-08 (×4): 30 mg via ORAL
  Filled 2017-10-05 (×4): qty 1

## 2017-10-05 NOTE — ED Triage Notes (Signed)
[facility] arrived from [facility] via [facility] for c/o SOB, Bilateral shoulder pain and chest pain. Pt was given his inhaler at the facility and had relief of SOB but shoulder pain continued. Pt then started c/o CP to EMS PTA.  20R wrist 324ASA and 1 nitro PTA with EMS BP 151/80 P NSR 90s 95% [place] 3L

## 2017-10-05 NOTE — Progress Notes (Signed)
CRITICAL VALUE ALERT  Critical Value:  Troponin 10.07  Date & Time Notied:  10/05/2017 1640  Provider Notified: Sarajane Jews

## 2017-10-05 NOTE — Progress Notes (Signed)
No charge note. Palliative:  Chart reviewed and patient examined. Met at bedside with other family to introduce palliative and discuss GOC. Patient has just been moved to room from ED - he asks that we have [place] discussion tomorrow. Scheduled meeting for 3pm 5/20 with patient, wife, and daughter.  Thank you for this consult.  Juel Burrow, DNP, AGNP-C Palliative Medicine Team Team Phone # [phone]

## 2017-10-05 NOTE — Progress Notes (Signed)
ANTICOAGULATION CONSULT NOTE - Initial Consult  Pharmacy Consult:  Heparin Indication: chest pain/ACS  No Known Allergies  Patient Measurements: Height: 5\' 6"  (167.6 cm) Weight: 152 lb (68.9 kg) IBW/kg (Calculated) : 63.8 Heparin Dosing Weight: 69 kg  Vital Signs: Temp: 97.8 F (36.6 C) (05/19 0912) Temp Source: Oral (05/19 0912) BP: 125/80 (05/19 0912) Pulse Rate: 86 (05/19 0912)  Labs: Recent Labs    10/05/17 0920  HGB 11.6*  HCT 37.2*  PLT 163    Estimated Creatinine Clearance: 21.1 mL/min (A) (by C-G formula based on SCr of 2.06 mg/dL (H)).   Medical History: Past Medical History:  Diagnosis Date  . Anxiety   . Bradycardia    a. BB d/c'd in 2016.  [name] CAD (coronary artery disease)    a. 1983 s/p CABG [facility] [place]);  b. 2007 NSTEMI->med Rx for distal LCX dzs; c. 09/2014 NSTEMI/Cath: LIMA->LAD & diag ok, native RCA dominant and patent.   VG->RCA 100, VG->LCX 100 -->Med Rx.  . CKD (chronic kidney disease), stage III ([place])   . Claudication ([place])    BLE  . Depression   . Diastolic dysfunction    a. 10/2014 Echo: F 60-65%, Gr1 DD, mild AS, mild AI, mild to mod MR, PASP 38mmHg.  [name] Dyspnea 2010   syndrome - extensive  . Erectile dysfunction   . GERD (gastroesophageal reflux disease)   . H/O hiatal hernia   . History of blood transfusion 2014  . Hx of adenomatous colonic polyps 2007  . Hyperlipidemia   . Hypertensive heart disease   . Inguinal hernia   . Iron deficiency anemia   . Shingles 1990's  . Sigmoid diverticulosis   . Upper GI bleeding 2014   Gastritis and possible duodenal ulcer seen on EGD, felt secondary to NSAIDs  . Urethral stricture 11/2008  . Vasovagal syncope 05/2006  . Weight loss       Assessment: 89 YOM presented with chest pain and SOB.  Pharmacy consulted to initiate IV heparin for ACS.  Troponin elevated at 5.44.    Goal of Therapy:  Heparin level 0.3-0.7 units/ml Monitor platelets by anticoagulation protocol: Yes    Plan:   Heparin 3000 units IV bolus x 1, then Heparin gtt at 850 units/hr Check 8 hr heparin level Daily heparin level and CBC   Hertha Gergen D. Mina Marble, PharmD, BCPS, BCCCP Pager:  [phone] 10/05/2017, 10:03 AM

## 2017-10-05 NOTE — ED Notes (Signed)
hospitalist Provider at bedside.

## 2017-10-05 NOTE — Progress Notes (Signed)
Pt received from ED. VSS. Telemetry applied. CHG complete. Pt and family oriented to room and unit. Dinner tray ordered. Will continue to monitor.  Clyde Canterbury, RN

## 2017-10-05 NOTE — Plan of Care (Signed)
Plan of care initiated and progressing

## 2017-10-05 NOTE — ED Notes (Signed)
Patient transported to X-ray 

## 2017-10-05 NOTE — H&P (Signed)
History and Physical  SHIELDS PAUTZ XWR:[id number] DOB: 1923-08-04 DOA: 10/05/2017  PCP: [name], MD   Chief Complaint: short of breath  HPI:  84 year old man PMH CAD, status post CABG, CKD stage III, lung cancer currently undergoing palliative chemotherapy, who presented with shoulder pain and shortness of breath.  Initial evaluation revealed NSTEMI, given comorbidities, cardiology recommended medical admission, conservative care.  Patient reports yesterday in the morning he developed severe shoulder pain and scapular pain without specific aggravating or alleviating factors.  Associated with shortness of breath.  He slept very well last night but this morning upon being woken by the nurse at assisted living he was quite short of breath.  He was able to stagger to his wife's room and looked to be in severe distress at that time.  He has had shoulder and upper back pain and shortness of breath today although currently this is improved.  ED Course: Treated with aspirin, heparin infusion per cardiology.  Eventually Plavix after thoracentesis if needed..  Review of Systems:  Negative for fever, new visual changes, sore throat, rash, new muscle aches,dysuria, bleeding, n/v/abdominal pain/diarrhea.  Past Medical History:  Diagnosis Date  . Anxiety   . Bradycardia    a. BB d/c'd in 2016.  [name] CAD (coronary artery disease)    a. 1983 s/p CABG [facility], [place]);  b. 2007 NSTEMI->med Rx for distal LCX dzs; c. 09/2014 NSTEMI/Cath: LIMA->LAD & diag ok, native RCA dominant and patent.   VG->RCA 100, VG->LCX 100 -->Med Rx.  . CKD (chronic kidney disease), stage III ([place])   . Claudication ([place])    BLE  . Depression   . Diastolic dysfunction    a. 10/2014 Echo: F 60-65%, Gr1 DD, mild AS, mild AI, mild to mod MR, PASP 46mmHg.  [name] Dyspnea 2010   syndrome - extensive  . Erectile dysfunction   . GERD (gastroesophageal reflux disease)   . H/O hiatal hernia   . History of blood transfusion 2014  .  Hx of adenomatous colonic polyps 2007  . Hyperlipidemia   . Hypertensive heart disease   . Inguinal hernia   . Iron deficiency anemia   . Lung cancer ([place])   . Shingles 1990's  . Sigmoid diverticulosis   . Upper GI bleeding 2014   Gastritis and possible duodenal ulcer seen on EGD, felt secondary to NSAIDs  . Urethral stricture 11/2008  . Vasovagal syncope 05/2006  . Weight loss     Past Surgical History:  Procedure Laterality Date  . CARDIAC CATHETERIZATION  2007   Left main 100%, RCA 70% ostial, all grafts were patent, EF 40-45 percent, distal circumflex subtotal after graft insertion, medical therapy   . CARDIAC CATHETERIZATION N/A 10/14/2014   Procedure: Left Heart Cath and Cors/Grafts Angiography;  Surgeon: Lorretta Harp, MD;  Location: [place] CV [facility];  Service: Cardiovascular;  Laterality: N/A;  . CHOLECYSTECTOMY  1980's  . CORONARY ARTERY BYPASS GRAFT  1994    LIMA-LAD, SVG-D1, SVG-OM,  SVG-RCA; performed in [place], [place]  . ESOPHAGOGASTRODUODENOSCOPY N/A 03/25/2013   Procedure: ESOPHAGOGASTRODUODENOSCOPY (EGD);  Surgeon: Missy Sabins, MD;  gastritis, cannot rule out duodenal ulcer   . INGUINAL HERNIA REPAIR Left   . TRANSURETHRAL RESECTION OF PROSTATE       reports that he has never smoked. He has never used smokeless tobacco. He reports that he drank about 4.2 oz of alcohol per week. He reports that he does not use drugs. Mobility: Uses cane or Rollator  No Known  Allergies  Family History  Problem Relation Age of Onset  . Hypertension Mother   . Heart disease Mother   . Heart disease Father   . Heart failure Brother   . Heart failure Sister   . Heart failure Brother   . Heart attack Neg Hx   . Stroke Neg Hx      Prior to Admission medications   Medication Sig Start Date End Date Taking? Authorizing Provider  acetaminophen (TYLENOL) 500 MG tablet Take 500 mg by mouth every 6 (six) hours as needed for mild pain or moderate pain.   Yes [provider]  albuterol (PROAIR HFA) 108 (90 Base) MCG/ACT inhaler Inhale 2 puffs into the lungs every 6 (six) hours as needed for wheezing or shortness of breath.   Yes [provider]  amLODipine (NORVASC) 5 MG tablet TAKE 1 TABLET BY MOUTH  DAILY 09/08/17  Yes Mast, [name], NP  BREO ELLIPTA 100-25 MCG/INH AEPB Inhale 1 puff into the lungs daily. 09/25/17  Yes [provider]  buPROPion (WELLBUTRIN SR) 150 MG 12 hr tablet Take 150 mg by mouth daily. 09/12/17  Yes [provider]  Desvenlafaxine Succinate ER 25 MG TB24 Take 25 mg by mouth daily.   Yes [provider]  FERROCITE 324 MG TABS tablet TAKE 1 TABLET BY MOUTH QOD 02/20/17  Yes [provider]  isosorbide mononitrate (IMDUR) 30 MG 24 hr tablet Take 30 mg by mouth daily.   Yes [provider]  nitroGLYCERIN (NITROSTAT) 0.4 MG SL tablet Place 1 tablet (0.4 mg total) under the tongue every 5 (five) minutes x 3 doses as needed for chest pain. 10/15/14  Yes Isaiah Serge, NP  Omega-3 Fatty Acids (FISH OIL PO) Take 1 capsule by mouth daily.   Yes [provider]  OXYGEN Inhale 2 L into the lungs continuous.   Yes [provider]  pantoprazole (PROTONIX) 40 MG tablet Take 1 tablet (40 mg total) by mouth 2 (two) times daily before a meal. Patient taking differently: Take 40 mg by mouth daily.  08/04/17  Yes Levin Erp, PA  pregabalin (LYRICA) 50 MG capsule Take 50 mg by mouth as needed.    Yes [provider]  TAGRISSO 80 MG tablet TAKE 1 TABLET (80 MG TOTAL) BY MOUTH DAILY. 07/25/17  Yes Brunetta Genera, MD  vitamin B-12 (CYANOCOBALAMIN) 500 MCG tablet Take 500 mcg by mouth daily. complex   Yes [provider]    Physical Exam: Vitals:   10/05/17 1200 10/05/17 1222  BP: 132/79   Pulse: 77 76  Resp: (!) 21 17  Temp:    SpO2: 100% 99%    Constitutional:   . Appears calm, acutely ill. Eyes:  . pupils and irises appear normal . Normal  lids  ENMT:  . grossly normal hearing  . Lips appear normal Neck:  . neck appears normal, no masses . no thyromegaly Respiratory:  . CTA anteriorly bilaterally, no w/r/r.  Posteriorly there are decreased breath sounds on the left. . Mild to moderate increased respiratory effort.  Able to speak in short sentences. Cardiovascular:  . RRR, no m/r/g . No LE extremity edema   Abdomen:  . Soft, nondistended.  Mild epigastric tenderness and right upper quadrant tenderness noted.   . No hernias noted. Musculoskeletal:  . Digits/nails BUE: no clubbing, cyanosis, petechiae, infection . RLE, LLE   o strength and tone normal, no atrophy, no abnormal movements o No tenderness, masses  Skin:  . No rashes, lesions, ulcers noted on back or elsewhere . palpation of skin: no induration or nodules Psychiatric:  . Mental status o Mood, affect appropriate . judgment and insight appear intact   I have personally reviewed following labs and imaging studies  Labs:   Creatinine 1.90, appears to be at baseline  Potassium 5.5 but hemolysis noted.  Troponin 5.44  Imaging studies:   Chest x-ray independently reviewed showed increased left pleural effusion looks to be at least moderate in size.  Medical tests:   EKG independently reviewed: Sinus rhythm, PVC, minimal ST depression V2-V4   Principal Problem:   NSTEMI (non-ST elevated myocardial infarction) ([place]) Active Problems:   Primary adenocarcinoma of upper lobe of left lung (HCC)   CKD (chronic kidney disease), stage III (HCC)   Chronic diastolic CHF (congestive heart failure) (HCC)   Assessment/Plan NSTEMI, known coronary artery disease.  Currently pain-free and hemodynamics stable. --Regimen per cardiology including heparin infusion, aspirin, eventually Plavix.  Metastatic left lung adenocarcinoma involving left upper lobe lung mass with associated malignant left pleural effusion. --Tolerating pleural effusion well at this point.   Based on clinical condition, will consider thoracentesis tomorrow. --Will notify Dr. Irene Limbo of admission.  Patient is curious as to his prognosis and whether hospice should be involved. --We will hold Tagrisso --Will consult palliative care here to assist with symptom management and goals of care  CKD stage III appears to be at baseline.  Chronic diastolic CHF --Appears euvolemic.  Severity of Illness: The appropriate patient status for this patient is INPATIENT. Inpatient status is judged to be reasonable and necessary in order to provide the required intensity of service to ensure the patient's safety. The patient's presenting symptoms, physical exam findings, and initial radiographic and laboratory data in the context of their chronic comorbidities is felt to place them at high risk for further clinical deterioration. Furthermore, it is not anticipated that the patient will be medically stable for discharge from the hospital within 2 midnights of admission. The following factors support the patient status of inpatient.   * I certify that at the point of admission it is my clinical judgment that the patient will require inpatient hospital care spanning beyond 2 midnights from the point of admission due to high intensity of service, high risk for further deterioration and high frequency of surveillance required.*   DVT prophylaxis: heparin infusion Code Status: Full Family Communication: wife at bedside    Time spent: 64 minutes  Murray Hodgkins, MD  [facility] Direct contact: [phone] --[place] [place]  --[email or web address]; password [email or web address]  7PM-7AM contact night coverage as above  10/05/2017, 12:39 PM

## 2017-10-05 NOTE — ED Provider Notes (Signed)
[facility] EMERGENCY DEPARTMENT Provider Note   CSN: [id number] Arrival date & time: 10/05/17  4403     History   Chief Complaint Chief Complaint  Patient presents with  . Chest Pain  . Shoulder Pain  . Shortness of Breath    HPI Scott [name] is a 84 y.o. male.  HPI   84 year old male with dyspnea and bilateral shoulder pain.  He is unsure of the exact time, but he woke up very early this morning and felt like he could not get a good breath.  He was also having b/l shoulder discomfort.  He states that he went to bed in his usual state of health.  He currently denies any dyspnea or shoulder discomfort.  No chest pain.  He has a hx of lung CA currently getting what sounds like palliative chemo. S/p L thoracentesis 09/22/17. He is on 2L via [place] at baseline.  Cardiology note from 09/30/17: history of CAD status post CABG in 1983 in [place], [place]. He was admitted with a non-STEMI in 2007 and grafts were patent at that time but there was distal disease in the circumflex and is treated medically. He EF was 40-45% that time. He returned with a non-STEMI on 10/13/14 and underwent cardiac catheterization by Dr. [name]. He had a patent LIMA to the LAD and diagonal branch and patent dominant RCA. He had occluded vein grafts. Medical therapy was recommended. Toprol was stopped due to bradycardia.    Past Medical History:  Diagnosis Date  . Anxiety   . Bradycardia    a. BB d/c'd in 2016.  [name] CAD (coronary artery disease)    a. 1983 s/p CABG [facility], [place]);  b. 2007 NSTEMI->med Rx for distal LCX dzs; c. 09/2014 NSTEMI/Cath: LIMA->LAD & diag ok, native RCA dominant and patent.   VG->RCA 100, VG->LCX 100 -->Med Rx.  . CKD (chronic kidney disease), stage III ([place])   . Claudication ([place])    BLE  . Depression   . Diastolic dysfunction    a. 10/2014 Echo: F 60-65%, Gr1 DD, mild AS, mild AI, mild to mod MR, PASP 4mmHg.  [name] Dyspnea 2010   syndrome - extensive  . Erectile  dysfunction   . GERD (gastroesophageal reflux disease)   . H/O hiatal hernia   . History of blood transfusion 2014  . Hx of adenomatous colonic polyps 2007  . Hyperlipidemia   . Hypertensive heart disease   . Inguinal hernia   . Iron deficiency anemia   . Shingles 1990's  . Sigmoid diverticulosis   . Upper GI bleeding 2014   Gastritis and possible duodenal ulcer seen on EGD, felt secondary to NSAIDs  . Urethral stricture 11/2008  . Vasovagal syncope 05/2006  . Weight loss     Patient Active Problem List   Diagnosis Date Noted  . Physical deconditioning 09/17/2017  . Hypoxemia 09/17/2017  . Recurrent left pleural effusion 08/12/2017  . Internal hemorrhoids 08/12/2017  . Goals of care, counseling/discussion 08/12/2017  . Fall 07/16/2017  . Primary adenocarcinoma of upper lobe of left lung ([place]) 04/07/2017  . Decreased libido 08/26/2016  . Bradycardia, toprol stopped 10/15/2014  . CAD (coronary artery disease) of artery bypass graft, ALL VGs occluded, patent LIMA -->LAD, patent dominant RCA 10/15/2014  . Anemia 03/24/2013  . CKD (chronic kidney disease), stage IV ([place]) 03/24/2013  . Essential hypertension   . Anxiety   . Depression   . GERD (gastroesophageal reflux disease)   . Hyperlipidemia   .  Coronary artery disease due to lipid rich plaque   . HIATAL HERNIA 09/21/2008  . CT, CHEST, ABNORMAL 09/21/2008    Past Surgical History:  Procedure Laterality Date  . CARDIAC CATHETERIZATION  2007   Left main 100%, RCA 70% ostial, all grafts were patent, EF 40-45 percent, distal circumflex subtotal after graft insertion, medical therapy   . CARDIAC CATHETERIZATION N/A 10/14/2014   Procedure: Left Heart Cath and Cors/Grafts Angiography;  Surgeon: Lorretta Harp, MD;  Location: [facility] CV LAB;  Service: Cardiovascular;  Laterality: N/A;  . CHOLECYSTECTOMY  1980's  . CORONARY ARTERY BYPASS GRAFT  1994    LIMA-LAD, SVG-D1, SVG-OM,  SVG-RCA; performed in [place], [place]    . ESOPHAGOGASTRODUODENOSCOPY N/A 03/25/2013   Procedure: ESOPHAGOGASTRODUODENOSCOPY (EGD);  Surgeon: Missy Sabins, MD;  gastritis, cannot rule out duodenal ulcer   . INGUINAL HERNIA REPAIR Left   . TRANSURETHRAL RESECTION OF PROSTATE          Home Medications    Prior to Admission medications   Medication Sig Start Date End Date Taking? Authorizing Provider  acetaminophen (TYLENOL) 500 MG tablet Take 500 mg by mouth every 6 (six) hours as needed for mild pain or moderate pain.    [provider]  albuterol (PROAIR HFA) 108 (90 Base) MCG/ACT inhaler Inhale 2 puffs into the lungs every 6 (six) hours as needed for wheezing or shortness of breath.    [provider]  amLODipine (NORVASC) 5 MG tablet TAKE 1 TABLET BY MOUTH  DAILY 09/08/17   Mast, [name], NP  BREO ELLIPTA 100-25 MCG/INH AEPB Inhale 1 puff into the lungs daily. 09/25/17   [provider]  buPROPion (WELLBUTRIN SR) 150 MG 12 hr tablet Take 150 mg by mouth daily. 09/12/17   [provider]  Desvenlafaxine Succinate ER 25 MG TB24 Take 25 mg by mouth daily.    [provider]  FERROCITE 324 MG TABS tablet TAKE 1 TABLET BY MOUTH QOD 02/20/17   [provider]  isosorbide mononitrate (IMDUR) 30 MG 24 hr tablet Take 30 mg by mouth daily.    [provider]  nitroGLYCERIN (NITROSTAT) 0.4 MG SL tablet Place 1 tablet (0.4 mg total) under the tongue every 5 (five) minutes x 3 doses as needed for chest pain. 10/15/14   Isaiah Serge, NP  Omega-3 Fatty Acids (FISH OIL PO) Take 1 capsule by mouth daily.    [provider]  OXYGEN Inhale 2 L into the lungs continuous.    [provider]  pantoprazole (PROTONIX) 40 MG tablet Take 1 tablet (40 mg total) by mouth 2 (two) times daily before a meal. 08/04/17   Lemmon, Lavone Nian, PA  pregabalin (LYRICA) 50 MG capsule Take 50 mg by mouth at bedtime.    [provider]  TAGRISSO 80 MG tablet TAKE 1 TABLET (80 MG  TOTAL) BY MOUTH DAILY. 07/25/17   Brunetta Genera, MD  vitamin B-12 (CYANOCOBALAMIN) 1000 MCG tablet Take 1,000 mcg by mouth every evening.     [provider]    Family History Family History  Problem Relation Age of Onset  . Hypertension Mother   . Heart disease Mother   . Heart disease Father   . Heart failure Brother   . Heart failure Sister   . Heart failure Brother   . Heart attack Neg Hx   . Stroke Neg Hx     Social History Social History   Tobacco Use  . Smoking  status: Never Smoker  . Smokeless tobacco: Never Used  Substance Use Topics  . Alcohol use: Yes    Alcohol/week: 4.2 oz    Types: 7 Glasses of wine per week    Comment: wine - daily - currently not drinking  . Drug use: No     Allergies   Patient has no known allergies.   Review of Systems Review of Systems  All systems reviewed and negative, other than as noted in HPI.  Physical Exam Updated Vital Signs BP 125/80 (BP Location: Left Arm)   Pulse 86   Temp 97.8 F (36.6 C) (Oral)   Resp (!) 29   Ht 5\' 6"  (1.676 m)   Wt 68.9 kg (152 lb)   SpO2 95%   BMI 24.53 kg/m   Physical Exam  Constitutional: He appears well-developed and well-nourished. No distress.  HENT:  Head: Normocephalic and atraumatic.  Eyes: Conjunctivae are normal. Right eye exhibits no discharge. Left eye exhibits no discharge.  Neck: Neck supple.  Cardiovascular: Normal rate, regular rhythm and normal heart sounds. Exam reveals no gallop and no friction rub.  No murmur heard. Pulmonary/Chest: Effort normal and breath sounds normal. No respiratory distress.  Abdominal: Soft. He exhibits no distension. There is no tenderness.  Musculoskeletal: He exhibits no edema or tenderness.       Right lower leg: He exhibits no edema.       Left lower leg: He exhibits no edema.  Neurological: He is alert.  Skin: Skin is warm and dry.  Psychiatric: He has a normal mood and affect. His behavior is normal. Thought content  normal.  Nursing note and vitals reviewed.    ED Treatments / Results  Labs (all labs ordered are listed, but only abnormal results are displayed) Labs Reviewed  BASIC METABOLIC PANEL - Abnormal; Notable for the following components:      Result Value   Potassium 5.5 (*)    Glucose, Bld 106 (*)    BUN 29 (*)    Creatinine, Ser 1.90 (*)    Calcium 8.5 (*)    GFR calc non Af Amer 29 (*)    GFR calc Af Amer 34 (*)    All other components within normal limits  CBC - Abnormal; Notable for the following components:   Hemoglobin 11.6 (*)    HCT 37.2 (*)    All other components within normal limits  I-STAT TROPONIN, ED - Abnormal; Notable for the following components:   Troponin i, poc 5.44 (*)    All other components within normal limits  HEPARIN LEVEL (UNFRACTIONATED)    EKG EKG Interpretation  Date/Time:  Sunday Oct 05 2017 09:12:55 EDT Ventricular Rate:  85 PR Interval:    QRS Duration: 100 QT Interval:  382 QTC Calculation: 455 R Axis:   43 Text Interpretation:  Sinus rhythm Ventricular premature complex Borderline low voltage, extremity leads Minimal ST depression, anterior leads Confirmed by Virgel Manifold [phone]) on 10/05/2017 9:18:29 AM   Radiology Dg Chest 2 View  Result Date: 10/05/2017 CLINICAL DATA:  SOB and wheezing this AM; pt reports he was having pain but said he wasn't sure he could tell me where his pain was; former smoker EXAM: CHEST - 2 VIEW COMPARISON:  569 FINDINGS: Stable enlarged cardiac silhouette. Large hiatal hernia posterior to the heart. Increasing LEFT pleural effusion which extends along the LEFT lateral chest wall superiorly. Mild central venous congestion is increased. IMPRESSION: 1. Increasing LEFT effusion and central venous congestion. 2. Large  hiatal hernia. Electronically Signed   By: Suzy Bouchard M.D.   On: 10/05/2017 10:05    Procedures Procedures (including critical care time)  CRITICAL CARE Performed by: Virgel Manifold Total  critical care time: 35 minutes Critical care time was exclusive of separately billable procedures and treating other patients. Critical care was necessary to treat or prevent imminent or life-threatening deterioration. Critical care was time spent personally by me on the following activities: development of treatment plan with patient and/or surrogate as well as nursing, discussions with consultants, evaluation of patient's response to treatment, examination of patient, obtaining history from patient or surrogate, ordering and performing treatments and interventions, ordering and review of laboratory studies, ordering and review of radiographic studies, pulse oximetry and re-evaluation of patient's condition.   Medications Ordered in ED Medications - No data to display   Initial Impression / Assessment and Plan / ED Course  I have reviewed the triage vital signs and the nursing notes.  Pertinent labs & imaging results that were available during my care of the patient were reviewed by me and considered in my medical decision making (see chart for details).     89yM with dyspnea and b/l shoulder pain. Consistent with posterior wall MI. Typical symptoms. New ST depression anterior precordial leads. Troponin significantly elevated.  Symptoms now resolved. ASA/heparin. Not sure how aggressive care would be with advanced age, palliative tx for lung CA and worsening renal function at least based on labs from two weeks ago. Discussed with cardiology. Discussed with medicine for admission.   Final Clinical Impressions(s) / ED Diagnoses   Final diagnoses:  NSTEMI (non-ST elevated myocardial infarction) [facility])    ED Discharge Orders    None       Virgel Manifold, MD 10/08/17 [phone]

## 2017-10-05 NOTE — Progress Notes (Signed)
ANTICOAGULATION CONSULT NOTE  Pharmacy Consult:  Heparin Indication: chest pain/ACS  No Known Allergies  Patient Measurements: Height: 5\' 6"  (167.6 cm) Weight: 152 lb (68.9 kg) IBW/kg (Calculated) : 63.8 Heparin Dosing Weight: 69 kg  Vital Signs: Temp: 98.4 F (36.9 C) (05/19 2012) Temp Source: Oral (05/19 2012) BP: 123/71 (05/19 2012) Pulse Rate: 77 (05/19 2012)  Labs: Recent Labs    10/05/17 0920 10/05/17 1521 10/05/17 2035  HGB 11.6*  --   --   HCT 37.2*  --   --   PLT 163  --   --   HEPARINUNFRC  --   --  0.59  CREATININE 1.90*  --   --   TROPONINI  --  10.07*  --     Estimated Creatinine Clearance: 22.9 mL/min (A) (by C-G formula based on SCr of 1.9 mg/dL (H)).   Assessment: 67 YOM presented with chest pain and SOB.  Pharmacy consulted to initiate IV heparin for ACS.  Troponin elevated at 5.44.  Initial heparin level therapeutic   Goal of Therapy:  Heparin level 0.3-0.7 units/ml Monitor platelets by anticoagulation protocol: Yes    Plan:  Continue Heparin gtt at 850 units/hr Daily heparin level and CBC  Thank you Anette Guarneri, PharmD [phone] 10/05/2017, 9:45 PM

## 2017-10-05 NOTE — Consult Note (Addendum)
Cardiology Consultation:   Patient ID: Scott [name]; [id number]; [birth year]   Admit date: 10/05/2017 Date of Consult: 10/05/2017  Primary Care Provider: [name], MD Primary Cardiologist: Larae Grooms, MD  Primary Electrophysiologist:     Patient Profile:   Scott [name] is a 84 y.o. male with a hx of known CAD s/p CABG and heart cath with occluded left main, vasovagal syncope, HTN, hypertensive heart disease, HLD, GERD, CKD stage III who is being seen today for the evaluation of NSTEMI at the request of Dr. Wilson Singer.  History of Present Illness:   Scott [name] has a complex cardiac history with CABG in 1983 (in [place]), cath in 2007 with patent LIMA-LAD, SVG-Cx, and occluded SVG-RCA and diagonal branch; known occluded left main. His last cath was 2016 which showed continued patent LIMA-LAD and widely patent dominant RCA - no intervention at that cath. Left main is 100% stenosed.   He was recently seen in clinic by Dr. Irish Lack on 09/30/17. It ws noted that he has a patent LIMA-LAD and diagonal branch wih patent dominant RCA and occluded vein grafts. He has done well on medical therapy. He lives at [facility] and is being treated for lung cancer - noted that care is palliative based on 04/17/17 note Scott [name]). He was not having angina at that time. No medication changes.   Today, he states he slept well overnight but woke up with significant wheezing and shortness of breath. He denies chest pain but states that he had bilateral shoulder pain. He is unable to remember his anginal equivalent during his last NSTEMI in 2016. He was brought [facility] and found to have an elevated troponin of 5.44. On my interview, he has no complaints and continues to deny chest pain  He reports knowing that he had shoulder pain this morning but he had it yesterday also.  He is now pain-free.  He is having significant concomitant shortness of breath.  CXR with increasing left pleural effusion and central  venous congestion.   On exam, he continues to wheeze with diminished breath sounds in left base. He denies chest and shoulder pain. He wears supplemental O2 at baseline.   He underwent thoracentesis 5/6.  Past Medical History:  Diagnosis Date  . Anxiety   . Bradycardia    a. BB d/c'd in 2016.  [name] CAD (coronary artery disease)    a. 1983 s/p CABG [facility] [place]);  b. 2007 NSTEMI->med Rx for distal LCX dzs; c. 09/2014 NSTEMI/Cath: LIMA->LAD & diag ok, native RCA dominant and patent.   VG->RCA 100, VG->LCX 100 -->Med Rx.  . CKD (chronic kidney disease), stage III ([place])   . Claudication ([place])    BLE  . Depression   . Diastolic dysfunction    a. 10/2014 Echo: F 60-65%, Gr1 DD, mild AS, mild AI, mild to mod MR, PASP 5mmHg.  [name] Dyspnea 2010   syndrome - extensive  . Erectile dysfunction   . GERD (gastroesophageal reflux disease)   . H/O hiatal hernia   . History of blood transfusion 2014  . Hx of adenomatous colonic polyps 2007  . Hyperlipidemia   . Hypertensive heart disease   . Inguinal hernia   . Iron deficiency anemia   . Shingles 1990's  . Sigmoid diverticulosis   . Upper GI bleeding 2014   Gastritis and possible duodenal ulcer seen on EGD, felt secondary to NSAIDs  . Urethral stricture 11/2008  . Vasovagal syncope 05/2006  . Weight loss     Past  Surgical History:  Procedure Laterality Date  . CARDIAC CATHETERIZATION  2007   Left main 100%, RCA 70% ostial, all grafts were patent, EF 40-45 percent, distal circumflex subtotal after graft insertion, medical therapy   . CARDIAC CATHETERIZATION N/A 10/14/2014   Procedure: Left Heart Cath and Cors/Grafts Angiography;  Surgeon: Lorretta Harp, MD;  Location: [facility];  Service: Cardiovascular;  Laterality: N/A;  . CHOLECYSTECTOMY  1980's  . CORONARY ARTERY BYPASS GRAFT  1994    LIMA-LAD, SVG-D1, SVG-OM,  SVG-RCA; performed in [place], [place]  . ESOPHAGOGASTRODUODENOSCOPY N/A 03/25/2013   Procedure:  ESOPHAGOGASTRODUODENOSCOPY (EGD);  Surgeon: Missy Sabins, MD;  gastritis, cannot rule out duodenal ulcer   . INGUINAL HERNIA REPAIR Left   . TRANSURETHRAL RESECTION OF PROSTATE       Home Medications:  Prior to Admission medications   Medication Sig Start Date End Date Taking? Authorizing Provider  acetaminophen (TYLENOL) 500 MG tablet Take 500 mg by mouth every 6 (six) hours as needed for mild pain or moderate pain.    [provider]  albuterol (PROAIR HFA) 108 (90 Base) MCG/ACT inhaler Inhale 2 puffs into the lungs every 6 (six) hours as needed for wheezing or shortness of breath.    [provider]  amLODipine (NORVASC) 5 MG tablet TAKE 1 TABLET BY MOUTH  DAILY 09/08/17   Mast, [name], NP  BREO ELLIPTA 100-25 MCG/INH AEPB Inhale 1 puff into the lungs daily. 09/25/17   [provider]  buPROPion (WELLBUTRIN SR) 150 MG 12 hr tablet Take 150 mg by mouth daily. 09/12/17   [provider]  Desvenlafaxine Succinate ER 25 MG TB24 Take 25 mg by mouth daily.    [provider]  FERROCITE 324 MG TABS tablet TAKE 1 TABLET BY MOUTH QOD 02/20/17   [provider]  isosorbide mononitrate (IMDUR) 30 MG 24 hr tablet Take 30 mg by mouth daily.    [provider]  nitroGLYCERIN (NITROSTAT) 0.4 MG SL tablet Place 1 tablet (0.4 mg total) under the tongue every 5 (five) minutes x 3 doses as needed for chest pain. 10/15/14   Isaiah Serge, NP  Omega-3 Fatty Acids (FISH OIL PO) Take 1 capsule by mouth daily.    [provider]  OXYGEN Inhale 2 L into the lungs continuous.    [provider]  pantoprazole (PROTONIX) 40 MG tablet Take 1 tablet (40 mg total) by mouth 2 (two) times daily before a meal. 08/04/17   Lemmon, Lavone Nian, PA  pregabalin (LYRICA) 50 MG capsule Take 50 mg by mouth at bedtime.    [provider]  TAGRISSO 80 MG tablet TAKE 1 TABLET (80 MG TOTAL) BY MOUTH DAILY. 07/25/17   Brunetta Genera, MD  vitamin  B-12 (CYANOCOBALAMIN) 1000 MCG tablet Take 1,000 mcg by mouth every evening.     [provider]    Inpatient Medications: Scheduled Meds: . aspirin  324 mg Oral Once  . heparin  3,000 Units Intravenous Once   Continuous Infusions: . heparin     PRN Meds:   Allergies:   No Known Allergies  Social History:   Social History   Socioeconomic History  . Marital status: Married    Spouse name: Not on file  . Number of children: Not on file  . Years of education: Not on file  . Highest education level: Not on file  Occupational History  . Occupation: Retired  Scientific laboratory technician  . Financial resource strain:  Not hard at all  . Food insecurity:    Worry: Never true    Inability: Never true  . Transportation needs:    Medical: No    Non-medical: No  Tobacco Use  . Smoking status: Never Smoker  . Smokeless tobacco: Never Used  Substance and Sexual Activity  . Alcohol use: Yes    Alcohol/week: 4.2 oz    Types: 7 Glasses of wine per week    Comment: wine - daily - currently not drinking  . Drug use: No  . Sexual activity: Never    Birth control/protection: Abstinence  Lifestyle  . Physical activity:    Days per week: 0 days    Minutes per session: 0 min  . Stress: Not at all  Relationships  . Social connections:    Talks on phone: More than three times a week    Gets together: More than three times a week    Attends religious service: Never    Active member of club or organization: No    Attends meetings of clubs or organizations: Never    Relationship status: Married  . Intimate partner violence:    Fear of current or ex partner: No    Emotionally abused: No    Physically abused: No    Forced sexual activity: No  Other Topics Concern  . Not on file  Social History Narrative   He lives in [facility], independent living, with his wife.    Family History:    Family History  Problem Relation Age of Onset  . Hypertension Mother   . Heart disease Mother     . Heart disease Father   . Heart failure Brother   . Heart failure Sister   . Heart failure Brother   . Heart attack Neg Hx   . Stroke Neg Hx      ROS:  Please see the history of present illness.   All other ROS reviewed and negative.     Physical Exam/Data:   Vitals:   10/05/17 1045 10/05/17 1100 10/05/17 1115 10/05/17 1130  BP: (!) 113/57 116/64 (!) 117/58 136/69  Pulse: 68 72 68 74  Resp: (!) 22 (!) 23 (!) 23 (!) 21  Temp:      TempSrc:      SpO2: 98% 100% 99%   Weight:      Height:       No intake or output data in the 24 hours ending 10/05/17 1145 Filed Weights   10/05/17 0911  Weight: 152 lb (68.9 kg)   Body mass index is 24.53 kg/m.  General:  Frail elderly male in no acute distress HEENT: normal Neck: no JVD Vascular: No carotid bruits Cardiac:  normal S1, S2; RRR; no murmur, difficult exam over wheezing Lungs:  Wheezing throughout, diminished in left base Abd: soft, nontender, no hepatomegaly  Ext: no edema Musculoskeletal:  No deformities, BUE and BLE strength normal and equal Skin: warm and dry  Neuro:  CNs 2-12 intact, no focal abnormalities noted Psych:  Normal affect alert and oriented x2  EKG:  The EKG was personally reviewed and demonstrates:  ST depression V2/3/4/5 Telemetry:  Telemetry was personally reviewed and demonstrates:  sinus  Relevant CV Studies:  Left heart cath 10/14/14:  Ost LM to LM lesion, 100% stenosed.  Mid RCA lesion, 40% stenosed.  Origin to Prox Graft lesion, 100% stenosed.  Prox Graft lesion, 100% stenosed.  Prox Graft lesion, 100% stenosed.   Scott [name] is a  84 y.o. male   Echo 10/21/14: Study Conclusions - Left ventricle: The cavity size was normal. Wall thickness was   normal. Systolic function was normal. The estimated ejection   fraction was in the range of 60% to 65%. Doppler parameters are   consistent with abnormal left ventricular relaxation (grade 1   diastolic dysfunction). - Aortic valve:  AV is thickened, calcified. Peak and mean gradients   through the valve are 25 and 13 mm Hg respectively consistent   with mild aortic stenosis. There was mild regurgitation. - Mitral valve: There was mild to moderate regurgitation. - Pulmonary arteries: PA peak pressure: 38 mm Hg (S).  Laboratory Data:  Chemistry Recent Labs  Lab 10/05/17 0920  NA 139  K 5.5*  CL 109  CO2 22  GLUCOSE 106*  BUN 29*  CREATININE 1.90*  CALCIUM 8.5*  GFRNONAA 29*  GFRAA 34*  ANIONGAP 8    No results for input(s): PROT, ALBUMIN, AST, ALT, ALKPHOS, BILITOT in the last 168 hours. Hematology Recent Labs  Lab 10/05/17 0920  WBC 8.4  RBC 4.24  HGB 11.6*  HCT 37.2*  MCV 87.7  MCH 27.4  MCHC 31.2  RDW 14.6  PLT 163   Cardiac EnzymesNo results for input(s): TROPONINI in the last 168 hours.  Recent Labs  Lab 10/05/17 0932  TROPIPOC 5.44*    BNPNo results for input(s): BNP, PROBNP in the last 168 hours.  DDimer No results for input(s): DDIMER in the last 168 hours.  Radiology/Studies:  Dg Chest 2 View  Result Date: 10/05/2017 CLINICAL DATA:  SOB and wheezing this AM; pt reports he was having pain but said he wasn't sure he could tell me where his pain was; former smoker EXAM: CHEST - 2 VIEW COMPARISON:  569 FINDINGS: Stable enlarged cardiac silhouette. Large hiatal hernia posterior to the heart. Increasing LEFT pleural effusion which extends along the LEFT lateral chest wall superiorly. Mild central venous congestion is increased. IMPRESSION: 1. Increasing LEFT effusion and central venous congestion. 2. Large hiatal hernia. Electronically Signed   By: Suzy Bouchard M.D.   On: 10/05/2017 10:05    Assessment and Plan:   1. NSTEMI, known CAD with last cath 2016 without intervention - troponin 5.44 - EKG with ST depression V2/3/4/5 - start heparin drip - was given full dose ASA - SOB and shoulder pain have resolved - will start heparin drip and plavix for 72 hrs for conservative  management of NSTEMI   2. HTN - home: norvasc, imdur - pressures have been controlled   3. CKD stage III - sCr 1.90 - baseline appears to be 1.69-1.76   Pt presents with NSTEMI with ST depression in precordial leads. Given his overall clinical picture with lung cancer and CXR with left effusion in addition to CKD stage III, would like to avoid heart catheterization. Recommend heparin gtt and plavix for 72 hrs. Continue to trend troponins. He denies frank chest pain with SOB and wheezing as his main complaints, which are likely related to his underlying lung cancer and pleural effusion. Will manage medically for now.    For questions or updates, please contact [place] Please consult [email or web address] for contact info under Cardiology/STEMI.   Signed, Ledora Bottcher, PA  10/05/2017 11:45 AM   NSTEMI  CAD s/p CABG with known SVG occlusion  LungCa with recurrent pleural effusions  CKD stage 4 GFR about 25  HTN    The patient has a non-STEMI.  I think, in the context  of comorbidities, lung cancer, recurrent pleural effusions and worsening renal function, that a conservative approach to his non-STEMI is indicated and other issues need to be addressed.  As relates to the former, we will treat with heparin, aspirin and Plavix.  The former for a couple of days in the latter for a month or so; Dr. Donley Redder may choose to treat him long-term with Plavix rather than aspirin. We will hold off on his Plavix for right now however, in the event that repeat thoracentesis will be undertaken.  I reviewed this with the family.  We will follow along with you.

## 2017-10-06 DIAGNOSIS — Z515 Encounter for palliative care: Secondary | ICD-10-CM

## 2017-10-06 DIAGNOSIS — I5032 Chronic diastolic (congestive) heart failure: Secondary | ICD-10-CM

## 2017-10-06 DIAGNOSIS — I214 Non-ST elevation (NSTEMI) myocardial infarction: Principal | ICD-10-CM

## 2017-10-06 DIAGNOSIS — C3412 Malignant neoplasm of upper lobe, left bronchus or lung: Secondary | ICD-10-CM

## 2017-10-06 DIAGNOSIS — N183 Chronic kidney disease, stage 3 (moderate): Secondary | ICD-10-CM

## 2017-10-06 DIAGNOSIS — Z7189 Other specified counseling: Secondary | ICD-10-CM

## 2017-10-06 LAB — BASIC METABOLIC PANEL
ANION GAP: 10 (ref 5–15)
BUN: 28 mg/dL — ABNORMAL HIGH (ref 6–20)
CALCIUM: 8.8 mg/dL — AB (ref 8.9–10.3)
CO2: 22 mmol/L (ref 22–32)
Chloride: 108 mmol/L (ref 101–111)
Creatinine, Ser: 1.85 mg/dL — ABNORMAL HIGH (ref 0.61–1.24)
GFR, EST AFRICAN AMERICAN: 35 mL/min — AB (ref >60)
GFR, EST NON AFRICAN AMERICAN: 30 mL/min — AB (ref >60)
Glucose, Bld: 90 mg/dL (ref 65–99)
POTASSIUM: 4.3 mmol/L (ref 3.5–5.1)
SODIUM: 140 mmol/L (ref 135–145)

## 2017-10-06 LAB — CBC
HCT: 38.1 % — ABNORMAL LOW (ref 39.0–52.0)
Hemoglobin: 11.8 g/dL — ABNORMAL LOW (ref 13.0–17.0)
MCH: 27.6 pg (ref 26.0–34.0)
MCHC: 31 g/dL (ref 30.0–36.0)
MCV: 89 fL (ref 78.0–100.0)
PLATELETS: 158 10*3/uL (ref 150–400)
RBC: 4.28 MIL/uL (ref 4.22–5.81)
RDW: 14.6 % (ref 11.5–15.5)
WBC: 7 10*3/uL (ref 4.0–10.5)

## 2017-10-06 LAB — TROPONIN I: Troponin I: 8.99 ng/mL (ref <0.03)

## 2017-10-06 LAB — HEPARIN LEVEL (UNFRACTIONATED): HEPARIN UNFRACTIONATED: 0.56 [IU]/mL (ref 0.30–0.70)

## 2017-10-06 NOTE — Progress Notes (Addendum)
PROGRESS NOTE        PATIENT DETAILS Name: Scott [name] Age: 84 y.o. Sex: male Date of Birth: Mar 26, 1924 Admit Date: 10/05/2017 Admitting Physician Samuella Cota, MD OZD:GUYQIH, Mahima, MD  Brief Narrative: Patient is a 84 y.o. male with history of metastatic left lung adenocarcinoma on immunotherapy, recurrent malignant left-sided pleural effusion, history of CAD status post CABG, chronic kidney disease stage III-with chest pain and shortness of breath, found to have non-STEMI.  See below for further details  Subjective: Lying comfortably in bed-no chest pain or shortness of breath.  Assessment/Plan: Non-STEMI: Chest pain-free, evaluated by cardiology-recommendations are to pursue conservative management.  Continue with aspirin, IV heparin for at least 48 hours before discontinuing.   Metastatic left lung adenocarcinoma with recurrent pleural effusion: Appears to be on immunotherapy-currently on hold.  Claims he has had approximately 4 thoracocentesis this year alone, will touch base with patient's primary oncologist (awaiting callback)-he may actually benefit from a Pleurx catheter.  He currently appears to be asymptomatic from his pleural effusion-we will continue with full dose anticoagulation for 48 hours before deciding to proceed with either a thoracocentesis a Pleurx catheter after discussion with patient's primary oncologist.  Chronic diastolic heart failure: Euvolemic-follow volume status.  Chronic kidney disease stage III: Creatinine close to usual baseline.  Follow periodically.  Hypertension: Appears to be controlled-continue with Imdur  GERD: Continue PPI  Depression: Appears to be reasonably stable-continue Pristiq  Deconditioning/debility: PT evaluation ordered  Palliative care: DNR in place-unfortunate 84 year old with non-STEMI in a background of metastatic adenocarcinoma of the lung with recurrent left-sided pleural effusion.  No  family at bedside-cardiology recommending conservative management of non-STEMI.  Awaiting further input from the palliative care team.  DVT Prophylaxis: Full dose anticoagulation with Heparin  Code Status: DNR  Family Communication: None at bedside  Disposition Plan: Remain inpatient-suspect may need SNF on discharge.  Antimicrobial agents: Anti-infectives (From admission, onward)   None      Procedures: None  CONSULTS:  cardiology  Time spent: 35 minutes-Greater than 50% of this time was spent in counseling, explanation of diagnosis, planning of further management, and coordination of care.  MEDICATIONS: Scheduled Meds: . aspirin  324 mg Oral Once  . aspirin EC  81 mg Oral Daily  . buPROPion  150 mg Oral Daily  . fluticasone furoate-vilanterol  1 puff Inhalation Daily  . isosorbide mononitrate  30 mg Oral Daily  . pantoprazole  40 mg Oral Daily  . sodium chloride flush  3 mL Intravenous Q12H  . sodium chloride flush  3 mL Intravenous Q12H  . venlafaxine XR  37.5 mg Oral Q breakfast   Continuous Infusions: . sodium chloride    . heparin 850 Units/hr (10/05/17 1312)   PRN Meds:.sodium chloride, acetaminophen **OR** acetaminophen, albuterol, nitroGLYCERIN, ondansetron (ZOFRAN) IV, sodium chloride flush   PHYSICAL EXAM: Vital signs: Vitals:   10/05/17 2012 10/06/17 0400 10/06/17 0800 10/06/17 0821  BP: 123/71 110/81 128/64   Pulse: 77 68 77   Resp: 17 (!) 21 20   Temp: 98.4 F (36.9 C) 97.7 F (36.5 C) 98.4 F (36.9 C)   TempSrc: Oral Oral Oral   SpO2: 96% 96% 96% 96%  Weight:  69.6 kg (153 lb 7 oz)    Height:       Filed Weights   10/05/17 0911 10/06/17 0400  Weight:  68.9 kg (152 lb) 69.6 kg (153 lb 7 oz)   Body mass index is 24.77 kg/m.   General appearance :Awake, alert, not in any distress.  Chronically sick looking-frail HEENT: Atraumatic and Normocephalic Neck: supple Resp:Good air entry bilaterally, no added sounds  CVS: S1 S2 regular, no  murmurs.  GI: Bowel sounds present, Non tender and not distended with no gaurding, rigidity or rebound.No organomegaly Extremities: B/L Lower Ext shows no edema, both legs are warm to touch Neurology:  speech clear,Non focal, sensation is grossly intact. Psychiatric: Normal judgment and insight. Alert and oriented x 3. Musculoskeletal:No digital cyanosis Skin:No Rash, warm and dry Wounds:N/A  I have personally reviewed following labs and imaging studies  LABORATORY DATA: CBC: Recent Labs  Lab 10/05/17 0920 10/06/17 0240  WBC 8.4 7.0  HGB 11.6* 11.8*  HCT 37.2* 38.1*  MCV 87.7 89.0  PLT 163 536    Basic Metabolic Panel: Recent Labs  Lab 10/05/17 0920 10/06/17 0240  NA 139 140  K 5.5* 4.3  CL 109 108  CO2 22 22  GLUCOSE 106* 90  BUN 29* 28*  CREATININE 1.90* 1.85*  CALCIUM 8.5* 8.8*    GFR: Estimated Creatinine Clearance: 23.5 mL/min (A) (by C-G formula based on SCr of 1.85 mg/dL (H)).  Liver Function Tests: No results for input(s): AST, ALT, ALKPHOS, BILITOT, PROT, ALBUMIN in the last 168 hours. No results for input(s): LIPASE, AMYLASE in the last 168 hours. No results for input(s): AMMONIA in the last 168 hours.  Coagulation Profile: No results for input(s): INR, PROTIME in the last 168 hours.  Cardiac Enzymes: Recent Labs  Lab 10/05/17 1521 10/05/17 2035 10/06/17 0240  TROPONINI 10.07* 10.29* 8.99*    BNP (last 3 results) No results for input(s): PROBNP in the last 8760 hours.  HbA1C: No results for input(s): HGBA1C in the last 72 hours.  CBG: No results for input(s): GLUCAP in the last 168 hours.  Lipid Profile: No results for input(s): CHOL, HDL, LDLCALC, TRIG, CHOLHDL, LDLDIRECT in the last 72 hours.  Thyroid Function Tests: No results for input(s): TSH, T4TOTAL, FREET4, T3FREE, THYROIDAB in the last 72 hours.  Anemia Panel: No results for input(s): VITAMINB12, FOLATE, FERRITIN, TIBC, IRON, RETICCTPCT in the last 72 hours.  Urine  analysis:    Component Value Date/Time   COLORURINE YELLOW 04/09/2017 1400   APPEARANCEUR CLEAR 04/09/2017 1400   LABSPEC 1.018 04/09/2017 1400   PHURINE 6.0 04/09/2017 1400   GLUCOSEU NEGATIVE 04/09/2017 1400   HGBUR NEGATIVE 04/09/2017 1400   BILIRUBINUR NEGATIVE 04/09/2017 1400   KETONESUR NEGATIVE 04/09/2017 1400   PROTEINUR NEGATIVE 04/09/2017 1400   UROBILINOGEN 0.2 08/15/2012 1215   NITRITE NEGATIVE 04/09/2017 1400   LEUKOCYTESUR NEGATIVE 04/09/2017 1400    Sepsis Labs: Lactic Acid, Venous    Component Value Date/Time   LATICACIDVEN 0.8 01/22/2016 1717    MICROBIOLOGY: No results found for this or any previous visit (from the past 240 hour(s)).  RADIOLOGY STUDIES/RESULTS: Dg Chest 1 View  Result Date: 09/22/2017 CLINICAL DATA:  Status post left-sided thoracentesis EXAM: CHEST  1 VIEW COMPARISON:  Chest x-ray of September 15, 2017 FINDINGS: The volume of pleural fluid on the left has markedly decreased. Patchy linear density at the left lung base is present and there is obscuration of the left hemidiaphragm. There is a trace of pleural fluid blunting the right lateral costophrenic angle. The right lung is well-expanded and clear. There is a large hiatal hernia. The heart and pulmonary vascularity are normal.  The patient has undergone previous median sternotomy and internal mammary artery dissection. IMPRESSION: Interval decrease in the volume of pleural fluid on the left without evidence of postprocedure pneumothorax. Persistent left basilar atelectasis or pneumonia. New trace right pleural effusion. Electronically Signed   By: David  Martinique M.D.   On: 09/22/2017 11:25   Dg Chest 2 View  Result Date: 10/05/2017 CLINICAL DATA:  SOB and wheezing this AM; pt reports he was having pain but said he wasn't sure he could tell me where his pain was; former smoker EXAM: CHEST - 2 VIEW COMPARISON:  569 FINDINGS: Stable enlarged cardiac silhouette. Large hiatal hernia posterior to the heart.  Increasing LEFT pleural effusion which extends along the LEFT lateral chest wall superiorly. Mild central venous congestion is increased. IMPRESSION: 1. Increasing LEFT effusion and central venous congestion. 2. Large hiatal hernia. Electronically Signed   By: Suzy Bouchard M.D.   On: 10/05/2017 10:05   Dg Chest 2 View  Result Date: 09/15/2017 CLINICAL DATA:  Increased shortness of breath. Malignant pleural effusion. Primary adenocarcinoma of the upper lobe of the left lung. EXAM: CHEST - 2 VIEW COMPARISON:  Chest x-rays dated 09/03/2017, 08/08/2017 and 06/20/2017 and chest CT dated 08/01/2017 FINDINGS: There is a recurrent large left pleural effusion with compressive atelectasis of the left lung, similar to the appearance on the study of 06/20/2017. Right lung is clear.  Pulmonary vascularity is normal. Chronic large hiatal hernia no acute bone abnormality. Multiple surgical clips in the chest.  Previous median sternotomy. IMPRESSION: 1. Recurrent large left pleural effusion with compressive atelectasis of the left lung. 2. Chronic large hiatal hernia. Electronically Signed   By: Lorriane Shire M.D.   On: 09/15/2017 11:59   US Thoracentesis Asp Pleural Space W/img Guide  Result Date: 09/22/2017 INDICATION: Patient with history of metastatic adenocarcinoma of the lung with recurrent malignant left pleural effusion, dyspnea. Request made for diagnostic and therapeutic left thoracentesis. EXAM: ULTRASOUND GUIDED DIAGNOSTIC AND THERAPEUTIC LEFT THORACENTESIS MEDICATIONS: None COMPLICATIONS: None immediate. PROCEDURE: An ultrasound guided thoracentesis was thoroughly discussed with the patient and questions answered. The benefits, risks, alternatives and complications were also discussed. The patient understands and wishes to proceed with the procedure. Written consent was obtained. Ultrasound was performed to localize and mark an adequate pocket of fluid in the left chest. The area was then prepped and draped  in the normal sterile fashion. 1% Lidocaine was used for local anesthesia. Under ultrasound guidance a 6 Fr Safe-T-Centesis catheter was introduced. Thoracentesis was performed. The catheter was removed and a dressing applied. FINDINGS: A total of approximately 1.7 liters of slightly hazy, yellow fluid was removed. Samples were sent to the laboratory as requested by the clinical team. Due to patient chest discomfort only the above amount of fluid was removed today. IMPRESSION: Successful ultrasound guided diagnostic and therapeutic left thoracentesis yielding 1.7 liters of pleural fluid. Follow-up chest x-ray revealed no pneumothorax. Electronically Signed   By: Lucrezia Europe M.D.   On: 09/22/2017 11:30     LOS: 1 day   Oren Binet, MD  [facility]  If 7PM-7AM, please contact night-coverage  Please page via [email or web address]-Password [email or web address]-click on MD name and type text message  10/06/2017, 11:24 AM

## 2017-10-06 NOTE — Consult Note (Signed)
Consultation Note Date: 10/06/2017   Patient Name: Scott [name]  DOB: 1924/03/02  MRN: [id number]  Age / Sex: 84 y.o., male  PCP: [name], MD Referring Physician: Jonetta Osgood, MD  Reason for Consultation: Establishing goals of care  HPI/Patient Profile: 84 y.o. male  with past medical history of CAD s/p CABG, chronic diastolic CHF, CKD 3, HTN, HLD, GERD, and metastatic lung cancer on tag[place]so w/ malignant left-sided pleural effusion admitted on 10/05/2017 with shoulder pain and shortness of breath. Found to have NSTEMI being managed conservatively with aspirin and IV heparin. He has had 4 thoracenteses this year - last one 2 weeks ago.  PMT consulted for [place].   Clinical Assessment and Goals of Care: I have reviewed medical records including EPIC notes, labs and imaging, assessed the patient and then met at the bedside along with patient's wife, daughter, and granddaughter  to discuss diagnosis prognosis, GOC, EOL wishes, disposition and options.  I introduced Palliative Medicine as specialized medical care for people living with serious illness. It focuses on providing relief from the symptoms and stress of a serious illness. The goal is to improve quality of life for both the patient and the family.  Patient currently lives at [facility] assisted living with his wife.   As far as functional and nutritional status, they tell me of a decline. Some days he does not get out of bed at all. When he does get up, he requires a walker. Able to complete most ADLs independently. Appetite has been declining as well.   We discussed his current illness and what it means in the larger context of his on-going co-morbidities.  Natural disease trajectory and expectations at EOL were discussed. Specifically, we discussed his metastatic lung cancer and what he had discussed with his oncologist.   The difference between aggressive medical  intervention and comfort care was considered in light of the patient's goals of care. The patient and his family want to focus on his comfort.   Advanced directives, concepts specific to code status, artifical feeding and hydration, and rehospitalization were considered and discussed. Confirmed DNR. Would want to avoid artificial feeding and rehospitalization.  Hospice and Palliative Care services outpatient were explained and offered. Pt interested in returning home (independent living at [facility]) with home hospice. Family feels they would be able to support patient enough to make this feasible.  We discussed pleurx for symptom management and patient is interested if healthcare team agrees this is appropriate for him.  Questions and concerns were addressed. The family was encouraged to call with questions or concerns.   Primary Decision Maker PATIENT    SUMMARY OF RECOMMENDATIONS   -interested in Pleurx if healthcare team agrees this is appropriate for patient - [facility], family can provide care -confirmed DNR  - per patient's wishes, treatment plan should be developed with quality of life and comfort as priority  Code Status/Advance Care Planning:  DNR   Symptom Management:   Patient would want pleurx if appropriate  Currently denies pain, shortness of breath  Palliative Prophylaxis:   Aspiration, Bowel Regimen, Delirium Protocol, Frequent Pain Assessment, Oral Care and Turn Reposition  Additional Recommendations (Limitations, Scope, Preferences):  Avoid Hospitalization and No Artificial Feeding  Psycho-social/Spiritual:   Desire for further Chaplaincy support:no  Additional Recommendations: Education on Hospice  Prognosis:   < 6 months d/t metastatic lung cancer, poor functional and nutritional status  Discharge Planning:  Home with Hospice      Primary Diagnoses: Present on Admission: .  NSTEMI (non-ST elevated myocardial infarction) ([place]) . Primary adenocarcinoma of upper lobe of left lung ([place])   I have reviewed the medical record, interviewed the patient and family, and examined the patient. The following aspects are pertinent.  Past Medical History:  Diagnosis Date  . Anxiety   . Bradycardia    a. BB d/c'd in 2016.  [name] CAD (coronary artery disease)    a. 1983 s/p CABG [facility] [place]);  b. 2007 NSTEMI->med Rx for distal LCX dzs; c. 09/2014 NSTEMI/Cath: LIMA->LAD & diag ok, native RCA dominant and patent.   VG->RCA 100, VG->LCX 100 -->Med Rx.  . CKD (chronic kidney disease), stage III ([place])   . Claudication ([place])    BLE  . Depression   . Diastolic dysfunction    a. 10/2014 Echo: F 60-65%, Gr1 DD, mild AS, mild AI, mild to mod MR, PASP 22mHg.  .Marland KitchenDyspnea 2010   syndrome - extensive  . Erectile dysfunction   . GERD (gastroesophageal reflux disease)   . H/O hiatal hernia   . History of blood transfusion 2014  . Hx of adenomatous colonic polyps 2007  . Hyperlipidemia   . Hypertensive heart disease   . Inguinal hernia   . Iron deficiency anemia   . Lung cancer ([place]   . Shingles 1990's  . Sigmoid diverticulosis   . Upper GI bleeding 2014   Gastritis and possible duodenal ulcer seen on EGD, felt secondary to NSAIDs  . Urethral stricture 11/2008  . Vasovagal syncope 05/2006  . Weight loss    Social History   Socioeconomic History  . Marital status: Married    Spouse name: Not on file  . Number of children: Not on file  . Years of education: Not on file  . Highest education level: Not on file  Occupational History  . Occupation: Retired  SScientific laboratory technician . Financial resource strain: Not hard at all  . Food insecurity:    Worry: Never true    Inability: Never true  . Transportation needs:    Medical: No    Non-medical: No  Tobacco Use  . Smoking status: Never Smoker  . Smokeless tobacco: Never Used  Substance and Sexual Activity  . Alcohol use: Not  Currently    Alcohol/week: 4.2 oz    Types: 7 Glasses of wine per week    Comment: wine - daily - currently not drinking  . Drug use: No  . Sexual activity: Never    Birth control/protection: Abstinence  Lifestyle  . Physical activity:    Days per week: 0 days    Minutes per session: 0 min  . Stress: Not at all  Relationships  . Social connections:    Talks on phone: More than three times a week    Gets together: More than three times a week    Attends religious service: Never    Active member of club or organization: No    Attends meetings of clubs or organizations: Never    Relationship status: Married  Other Topics Concern  . Not on file  Social History Narrative   [facility].   Family History  Problem Relation Age of Onset  . Hypertension Mother   . Heart disease Mother   . Heart disease Father   . Heart failure Brother   . Heart failure Sister   . Heart failure  Brother   . Heart attack Neg Hx   . Stroke Neg Hx    Scheduled Meds: . aspirin  324 mg Oral Once  . aspirin EC  81 mg Oral Daily  . buPROPion  150 mg Oral Daily  . fluticasone furoate-vilanterol  1 puff Inhalation Daily  . isosorbide mononitrate  30 mg Oral Daily  . pantoprazole  40 mg Oral Daily  . sodium chloride flush  3 mL Intravenous Q12H  . sodium chloride flush  3 mL Intravenous Q12H  . venlafaxine XR  37.5 mg Oral Q breakfast   Continuous Infusions: . sodium chloride    . heparin 850 Units/hr (10/05/17 1312)   PRN Meds:.sodium chloride, acetaminophen **OR** acetaminophen, albuterol, nitroGLYCERIN, ondansetron (ZOFRAN) IV, sodium chloride flush No Known Allergies Review of Systems  Constitutional: Positive for activity change, appetite change and fatigue.  All other systems reviewed and are negative.   Physical Exam  Constitutional: He is oriented to person, place, and time. He appears well-developed. He appears ill. No distress.  HENT:    Head: Normocephalic and atraumatic.  Cardiovascular: An irregular rhythm present.  Pulmonary/Chest: No accessory muscle usage. No tachypnea. No respiratory distress.  Genitourinary:  Genitourinary Comments: Foley, urine present  Musculoskeletal:       Right lower leg: He exhibits edema.       Left lower leg: He exhibits edema.  Neurological: He is alert and oriented to person, place, and time.  Periods of confusion, easily redirected  Skin: Skin is warm and dry.  Psychiatric: He has a normal mood and affect. His behavior is normal.    Vital Signs: BP 128/76 (BP Location: Right Arm)   Pulse 96   Temp 98.2 F (36.8 C) (Oral)   Resp (!) 26   Ht 5' 6"  (1.676 m)   Wt 69.6 kg (153 lb 7 oz)   SpO2 95%   BMI 24.77 kg/m  Pain Scale: 0-10   Pain Score: 0-No pain   SpO2: SpO2: 95 % O2 Device:SpO2: 95 % O2 Flow Rate: .O2 Flow Rate (L/min): 2 L/min  IO: Intake/output summary:   Intake/Output Summary (Last 24 hours) at 10/06/2017 1325 Last data filed at 10/06/2017 0413 Gross per 24 hour  Intake 149.44 ml  Output 50 ml  Net 99.44 ml    LBM: Last BM Date: 10/05/17 Baseline Weight: Weight: 68.9 kg (152 lb) Most recent weight: Weight: 69.6 kg (153 lb 7 oz)     Palliative Assessment/Data: PPS 40%     Time In: 15:00 Time Out: 16:00 Time Total: 60 minutes Greater than 50%  of this time was spent counseling and coordinating care related to the above assessment and plan.  Juel Burrow, DNP, AGNP-C Palliative Medicine Team [phone]

## 2017-10-06 NOTE — Progress Notes (Signed)
ANTICOAGULATION CONSULT NOTE  Pharmacy Consult:  Heparin Indication: chest pain/ACS  No Known Allergies  Patient Measurements: Height: 5\' 6"  (167.6 cm) Weight: 153 lb 7 oz (69.6 kg) IBW/kg (Calculated) : 63.8 Heparin Dosing Weight: 69 kg  Vital Signs: Temp: 98.4 F (36.9 C) (05/20 0800) Temp Source: Oral (05/20 0800) BP: 128/64 (05/20 0800) Pulse Rate: 77 (05/20 0800)  Labs: Recent Labs    10/05/17 0920 10/05/17 1521 10/05/17 2035 10/06/17 0240  HGB 11.6*  --   --  11.8*  HCT 37.2*  --   --  38.1*  PLT 163  --   --  158  HEPARINUNFRC  --   --  0.59 0.56  CREATININE 1.90*  --   --  1.85*  TROPONINI  --  10.07* 10.29* 8.99*   Estimated Creatinine Clearance: 23.5 mL/min (A) (by C-G formula based on SCr of 1.85 mg/dL (H)).  Assessment: 61 YOM presented with chest pain and SOB.  Pharmacy consulted to initiate IV heparin for ACS.  Troponin elevated at 5.44.   Heparin level remains therapeutic, CBC stable, no infusion issues or overt bleeding reported   Goal of Therapy:  Heparin level 0.3-0.7 units/ml Monitor platelets by anticoagulation protocol: Yes    Plan:  Continue Heparin gtt at 850 units/hr Daily heparin level and CBC  Georga Bora, PharmD Clinical Pharmacist 10/06/2017 11:15 AM

## 2017-10-06 NOTE — Clinical Social Work Note (Signed)
Clinical Social Work Assessment  Patient Details  Name: Scott [name] MRN: [id number] Date of Birth: 06-Apr-1924  Date of referral:  10/06/17               Reason for consult:  Discharge Planning, Facility Placement                Permission sought to share information with:  Family Supports Permission granted to share information::  Yes, Verbal Permission Granted  Name::     Cloyde Reams  Agency::  [facility]   Relationship::  spouse  Contact Information:  [phone]  Housing/Transportation Living arrangements for the past 2 months:  [place] of Information:  Patient, Spouse, Adult Children Patient Interpreter Needed:  None Criminal Activity/Legal Involvement Pertinent to Current Situation/Hospitalization:    Significant Relationships:  Adult Children, Other Family Members, Spouse Lives with:  Spouse, Facility Resident Do you feel safe going back to the place where you live?  Yes Need for family participation in patient care:  Yes (Comment)  Care giving concerns:  Patient had family at bedside (Spouse, daughter and granddaughter). Family supportive of patient and is wanting him the best for him. Family stated that patient is from [facility] in the ALF section. Family stated they are interested in going to the rehab section of there ALF for short term rehab.   Social Worker assessment / plan:  CSW met patient and family at bedside. Family supportive of patient and patient very pleasant during assessment. Family stated they would like to go back to [facility] and go to there short term rehab facility. [facility]'s social worker Tharon Aquas [phone]: [phone]) reached out to [place] and stated they are following patient while his admitted at the hospital. Raquel Sarna stated that if patient needs SNF the facility will be able to accommodate    Employment status:  Retired Insurance underwriter information:  Medicare PT  Recommendations:  [place] / Referral to community resources:  [place]  Patient/Family's Response to care:  Patient not yet medically ready for discharge but family understands patients diagnoses and is supportive  Patient/Family's Understanding of and Emotional Response to Diagnosis, Current Treatment, and Prognosis:  CSW will continue to follow patient for needs.  Emotional Assessment Appearance:  Appears stated age Attitude/Demeanor/Rapport:  Engaged, Self-Confident Affect (typically observed):  Accepting, Pleasant Orientation:  Oriented to Self, Oriented to Place, Oriented to Situation Alcohol / Substance use:  Not Applicable Psych involvement (Current and /or in the community):  No (Comment)  Discharge Needs  Concerns to be addressed:  No discharge needs identified Readmission within the last 30 days:  No Current discharge risk:  None Barriers to Discharge:  No SNF bed   Wende Neighbors, LCSW 10/06/2017, 2:45 PM

## 2017-10-06 NOTE — Progress Notes (Addendum)
Progress Note  Patient Name: Scott [name] Date of Encounter: 10/06/2017  Primary Cardiologist: Larae Grooms, MD   Subjective   Pt continues to deny chest pain and is resting comfortably. He states he has not been hungry  Inpatient Medications    Scheduled Meds: . aspirin  324 mg Oral Once  . aspirin EC  81 mg Oral Daily  . buPROPion  150 mg Oral Daily  . fluticasone furoate-vilanterol  1 puff Inhalation Daily  . isosorbide mononitrate  30 mg Oral Daily  . pantoprazole  40 mg Oral Daily  . sodium chloride flush  3 mL Intravenous Q12H  . sodium chloride flush  3 mL Intravenous Q12H  . venlafaxine XR  37.5 mg Oral Q breakfast   Continuous Infusions: . sodium chloride    . heparin 850 Units/hr (10/05/17 1312)   PRN Meds: sodium chloride, acetaminophen **OR** acetaminophen, albuterol, nitroGLYCERIN, ondansetron (ZOFRAN) IV, sodium chloride flush   Vital Signs    Vitals:   10/05/17 2012 10/06/17 0400 10/06/17 0800 10/06/17 0821  BP: 123/71 110/81 128/64   Pulse: 77 68 77   Resp: 17 (!) 21 20   Temp: 98.4 F (36.9 C) 97.7 F (36.5 C) 98.4 F (36.9 C)   TempSrc: Oral Oral Oral   SpO2: 96% 96% 96% 96%  Weight:  153 lb 7 oz (69.6 kg)    Height:        Intake/Output Summary (Last 24 hours) at 10/06/2017 0921 Last data filed at 10/06/2017 0413 Gross per 24 hour  Intake 149.44 ml  Output 50 ml  Net 99.44 ml   Filed Weights   10/05/17 0911 10/06/17 0400  Weight: 152 lb (68.9 kg) 153 lb 7 oz (69.6 kg)    Telemetry    Sinus with PVCs - Personally Reviewed  ECG    No new tracings - Personally Reviewed  Physical Exam   GEN: No acute distress.   Neck: No JVD Cardiac: RRR, no murmurs, rubs, or gallops.  Respiratory: wheezes throughout, diminished in left base GI: Soft, nontender, non-distended  MS: No edema; No deformity. Neuro:  Nonfocal  Psych: Normal affect   Labs    Chemistry Recent Labs  Lab 10/05/17 0920 10/06/17 0240  NA 139 140  K  5.5* 4.3  CL 109 108  CO2 22 22  GLUCOSE 106* 90  BUN 29* 28*  CREATININE 1.90* 1.85*  CALCIUM 8.5* 8.8*  GFRNONAA 29* 30*  GFRAA 34* 35*  ANIONGAP 8 10     Hematology Recent Labs  Lab 10/05/17 0920 10/06/17 0240  WBC 8.4 7.0  RBC 4.24 4.28  HGB 11.6* 11.8*  HCT 37.2* 38.1*  MCV 87.7 89.0  MCH 27.4 27.6  MCHC 31.2 31.0  RDW 14.6 14.6  PLT 163 158    Cardiac Enzymes Recent Labs  Lab 10/05/17 1521 10/05/17 2035 10/06/17 0240  TROPONINI 10.07* 10.29* 8.99*    Recent Labs  Lab 10/05/17 0932  TROPIPOC 5.44*     BNPNo results for input(s): BNP, PROBNP in the last 168 hours.   DDimer No results for input(s): DDIMER in the last 168 hours.   Radiology    Dg Chest 2 View  Result Date: 10/05/2017 CLINICAL DATA:  SOB and wheezing this AM; pt reports he was having pain but said he wasn't sure he could tell me where his pain was; former smoker EXAM: CHEST - 2 VIEW COMPARISON:  569 FINDINGS: Stable enlarged cardiac silhouette. Large hiatal hernia posterior to the heart.  Increasing LEFT pleural effusion which extends along the LEFT lateral chest wall superiorly. Mild central venous congestion is increased. IMPRESSION: 1. Increasing LEFT effusion and central venous congestion. 2. Large hiatal hernia. Electronically Signed   By: Suzy Bouchard M.D.   On: 10/05/2017 10:05    Cardiac Studies   Left heart cath 10/14/14:  Ost LM to LM lesion, 100% stenosed.  Mid RCA lesion, 40% stenosed.  Origin to Prox Graft lesion, 100% stenosed.  Prox Graft lesion, 100% stenosed.  Prox Graft lesion, 100% stenosed.  Scott [name] a 84 y.o.male   Echo 10/21/14: Study Conclusions - Left ventricle: The cavity size was normal. Wall thickness was normal. Systolic function was normal. The estimated ejection fraction was in the range of 60% to 65%. Doppler parameters are consistent with abnormal left ventricular relaxation (grade 1 diastolic dysfunction). - Aortic  valve: AV is thickened, calcified. Peak and mean gradients through the valve are 25 and 13 mm Hg respectively consistent with mild aortic stenosis. There was mild regurgitation. - Mitral valve: There was mild to moderate regurgitation. - Pulmonary arteries: PA peak pressure: 38 mm Hg (S).   Patient Profile     84 y.o. male with a hx of known CAD s/p CABG and heart cath with occluded left main, vasovagal syncope, HTN, hypertensive heart disease, HLD, GERD, CKD stage III who ruled in for a NSTEMI. Given his co-morbid conditions, a conservative approach was chosen.  Assessment & Plan    1. NSTEMI, SOB - troponin 5.44 --> 10.07 --> 10.29 --> 8.99 - given his comorbidities, a conservative approach was chosen on admission., planned to optimize medical management - heparin drip and ASA for 72 hrs - holding plavix for now for possible thoracentesis - SOB and shoulder pain resolved in the ER with O2 - patient is a poor historian, but states he has mild shoulder pain - will discuss utility of repeating an echo today to guide medication selection and anti-anginal therapy   2. HTN - continue imdur, pressures well-controlled - norvasc on hold   3. CKD stage III - sCr 1.85 (1.9) - baseline is 1.69-1.76 - making good urine   4. Left pleural effusion, lung cancer - likely contributed to his sudden onset SOB - will defer to IM for pulmonary consult   5. Palliative care following    For questions or updates, please contact [place] Please consult [email or web address] for contact info under Cardiology/STEMI.      Signed, Ledora Bottcher, PA  10/06/2017, 9:21 AM    Agree with note by Scott Sharp PA-C  [name] was admitted where shoulder pain.  His troponins peaked at 10.  He has no acute EKG changes.  He does have lung cancer and is two-week status post thoracentesis with return of his pleural effusion.  I do not think he is an invasive candidate I recommend medical  therapy.  He is on IV heparin which can be discontinued prior to discharge.  I believe goals of care need to be addressed including living will and potentially palliative care.  His exam is benign.  There is a question whether or not he needs repeat thoracentesis which I will leave to the primary treating service.  We will sign off and be available for further questions should they arise.  Lorretta Harp, M.D., [place], [facility], [place] [address] [place], [place]  [address]  [phone] 10/06/2017 10:49 AM

## 2017-10-06 NOTE — Progress Notes (Signed)
PT Cancellation Note  Patient Details Name: [name] MRN: [id number] DOB: [birth year]   Cancelled Treatment:    Reason Eval/Treat Not Completed: Other (comment)(MD in with pt.  Will check back at later date. )   Denice Paradise 10/06/2017, 3:12 PM [facility] [phone] (pager)

## 2017-10-07 ENCOUNTER — Other Ambulatory Visit (HOSPITAL_COMMUNITY): Payer: Medicare Other

## 2017-10-07 ENCOUNTER — Inpatient Hospital Stay (HOSPITAL_COMMUNITY): Payer: Medicare Other

## 2017-10-07 ENCOUNTER — Encounter (HOSPITAL_COMMUNITY): Payer: Self-pay | Admitting: Student

## 2017-10-07 DIAGNOSIS — J910 Malignant pleural effusion: Secondary | ICD-10-CM

## 2017-10-07 HISTORY — PX: IR THORACENTESIS ASP PLEURAL SPACE W/IMG GUIDE: IMG5380

## 2017-10-07 LAB — CBC
HEMATOCRIT: 39.4 % (ref 39.0–52.0)
HEMOGLOBIN: 12.2 g/dL — AB (ref 13.0–17.0)
MCH: 27.4 pg (ref 26.0–34.0)
MCHC: 31 g/dL (ref 30.0–36.0)
MCV: 88.5 fL (ref 78.0–100.0)
Platelets: 169 10*3/uL (ref 150–400)
RBC: 4.45 MIL/uL (ref 4.22–5.81)
RDW: 14.4 % (ref 11.5–15.5)
WBC: 7.2 10*3/uL (ref 4.0–10.5)

## 2017-10-07 LAB — BASIC METABOLIC PANEL
Anion gap: 11 (ref 5–15)
BUN: 33 mg/dL — AB (ref 6–20)
CHLORIDE: 108 mmol/L (ref 101–111)
CO2: 22 mmol/L (ref 22–32)
Calcium: 9.2 mg/dL (ref 8.9–10.3)
Creatinine, Ser: 1.77 mg/dL — ABNORMAL HIGH (ref 0.61–1.24)
GFR calc Af Amer: 37 mL/min — ABNORMAL LOW (ref >60)
GFR calc non Af Amer: 32 mL/min — ABNORMAL LOW (ref >60)
Glucose, Bld: 96 mg/dL (ref 65–99)
POTASSIUM: 4.4 mmol/L (ref 3.5–5.1)
Sodium: 141 mmol/L (ref 135–145)

## 2017-10-07 LAB — HEPARIN LEVEL (UNFRACTIONATED): Heparin Unfractionated: 0.54 IU/mL (ref 0.30–0.70)

## 2017-10-07 MED ORDER — LIDOCAINE HCL (PF) 2 % IJ SOLN
INTRAMUSCULAR | Status: AC
  Filled 2017-10-07: qty 20

## 2017-10-07 MED ORDER — LIDOCAINE HCL (PF) 2 % IJ SOLN
INTRAMUSCULAR | Status: DC | PRN
  Administered 2017-10-07: 10 mL

## 2017-10-07 MED ORDER — ATORVASTATIN CALCIUM 40 MG PO TABS
40.0000 mg | ORAL_TABLET | Freq: Every day | ORAL | Status: DC
  Administered 2017-10-07: 40 mg via ORAL
  Filled 2017-10-07: qty 1

## 2017-10-07 MED ORDER — ENOXAPARIN SODIUM 30 MG/0.3ML ~~LOC~~ SOLN
30.0000 mg | Freq: Every day | SUBCUTANEOUS | Status: DC
  Administered 2017-10-07: 30 mg via SUBCUTANEOUS
  Filled 2017-10-07 (×2): qty 0.3

## 2017-10-07 MED ORDER — CLOPIDOGREL BISULFATE 75 MG PO TABS
75.0000 mg | ORAL_TABLET | Freq: Every day | ORAL | Status: DC
  Administered 2017-10-08: 75 mg via ORAL
  Filled 2017-10-07: qty 1

## 2017-10-07 NOTE — Care Management Note (Signed)
Case Management Note Marvetta Gibbons RN, BSN Unit 4E-Case Manager [phone]  Patient Details  Name: Scott [name] MRN: [id number] Date of Birth: [birth year]  Subjective/Objective:  Pt admitted with NSTEMI, hx of metastatic adenocarcinoma of the lung with recurrent left-sided pleural effusion.              Action/Plan: PTA pt lived at [place] with wife- CSW consulted for possible need of SNF at [place] has met with family and decision per family is for pt to return to ALF with wife- CM received referral for Home Hospice at [place]- per [facility] contracts with [facility] for hospice services- call has been made to [name] with [facility] for [place] referral have received call back and [facility] will f/u on referral and reach out to family- ? D/C 5/22 if hospice arrangements in place and pt stable for transition back to ALF. CM and CSW to continue to follow for transition of care needs.   Expected Discharge Date:                  Expected Discharge Plan:  Assisted Living / Rest Home  In-House Referral:  Clinical Social Work  Discharge planning Services  CM Consult  Post Acute Care Choice:  Hospice Choice offered to:  Adult Children, NA  DME Arranged:    DME Agency:     HH Arranged:    HH Agency:     Status of Service:  Completed, signed off  If discussed at [place] of Stay Meetings, dates discussed:    Discharge Disposition: Assisted Living with Home Hospice   Additional Comments:  Dawayne Patricia, RN 10/07/2017, 4:17 PM

## 2017-10-07 NOTE — Progress Notes (Signed)
PROGRESS NOTE        PATIENT DETAILS Name: Scott [name] Age: 84 y.o. Sex: male Date of Birth: [birth year] Admit Date: 10/05/2017 Admitting Physician Samuella Cota, MD UXN:ATFTDD, Mahima, MD  Brief Narrative: Patient is a 84 y.o. male with history of metastatic left lung adenocarcinoma on immunotherapy, recurrent malignant left-sided pleural effusion, history of CAD status post CABG, chronic kidney disease stage III-with chest pain and shortness of breath, found to have non-STEMI.  See below for further details  Subjective: No chest pain or shortness of breath.  Daughter at bedside  Assessment/Plan: Non-STEMI: Improved, chest pain-free-has completed 48 hours of IV heparin-we will go ahead and discontinue.  Continue aspirin-spoke with Dr. Alvester Chou (cardiology) will add Plavix-and plan for dual antiplatelets for at least 1 month.  As noted in prior notes, evaluated by cardiology, not a candidate for aggressive care-we are pursuing conservative management given his advanced age and history of underlying stage IV adenocarcinoma of the lung.  Metastatic left lung adenocarcinoma with recurrent pleural effusion: On immunotherapy which is currently on hold.  Spoke with patient's primary oncologist, subsequently spoke with patient and daughter at bedside-we will pursue a thoracocentesis prior to discharge-hold off on placing a Pleurx catheter unless thoracocentesis becomes more frequent.    Chronic diastolic heart failure: Euvolemic-follow volume status closely.  Chronic kidney disease stage III: Creatinine close to usual baseline, follow periodically.  Hypertension: Controlled-continue Imdur  GERD: Continue PPI  Depression: Appears to be reasonably stable-continue Pristiq  Deconditioning/debility: PT evaluation ordered  Palliative care: DNR in place-unfortunate 84 year old with non-STEMI in a background of metastatic adenocarcinoma of the lung with recurrent  left-sided pleural effusion.  Spoke with patient's daughter at bedside-agreement in pursuing gentle medical treatment.  SNF on discharge.   DVT Prophylaxis: Full dose anticoagulation with Heparin  Code Status: DNR  Family Communication: None at bedside  Disposition Plan: Remain inpatient-suspect may need SNF on discharge.  Antimicrobial agents: Anti-infectives (From admission, onward)   None      Procedures: None  CONSULTS:  cardiology  Time spent: 25 minutes-Greater than 50% of this time was spent in counseling, explanation of diagnosis, planning of further management, and coordination of care.  MEDICATIONS: Scheduled Meds: . aspirin  324 mg Oral Once  . aspirin EC  81 mg Oral Daily  . buPROPion  150 mg Oral Daily  . fluticasone furoate-vilanterol  1 puff Inhalation Daily  . isosorbide mononitrate  30 mg Oral Daily  . lidocaine      . pantoprazole  40 mg Oral Daily  . sodium chloride flush  3 mL Intravenous Q12H  . sodium chloride flush  3 mL Intravenous Q12H  . venlafaxine XR  37.5 mg Oral Q breakfast   Continuous Infusions: . sodium chloride    . heparin 850 Units/hr (10/05/17 1312)   PRN Meds:.sodium chloride, acetaminophen **OR** acetaminophen, albuterol, nitroGLYCERIN, ondansetron (ZOFRAN) IV, sodium chloride flush   PHYSICAL EXAM: Vital signs: Vitals:   10/07/17 0700 10/07/17 0805 10/07/17 1137 10/07/17 1140  BP: 138/82 138/83  122/75  Pulse:  79  77  Resp:  (!) 24  15  Temp:    97.7 F (36.5 C)  TempSrc:    Oral  SpO2:  97% 98% 95%  Weight:      Height:       Filed Weights   10/05/17 0911 10/06/17  0400  Weight: 68.9 kg (152 lb) 69.6 kg (153 lb 7 oz)   Body mass index is 24.77 kg/m.   General appearance:Awake, alert, not in any distress.  Eyes:no scleral icterus. HEENT: Atraumatic and Normocephalic Neck: supple, no JVD. Resp:Good air entry bilaterally,no rales or rhonchi CVS: S1 S2 regular GI: Bowel sounds present, Non tender and not  distended with no gaurding, rigidity or rebound. Extremities: B/L Lower Ext shows no edema, both legs are warm to touch Neurology:  Non focal Psychiatric: Normal judgment and insight. Normal mood. Musculoskeletal:No digital cyanosis Skin:No Rash, warm and dry Wounds:N/A  I have personally reviewed following labs and imaging studies  LABORATORY DATA: CBC: Recent Labs  Lab 10/05/17 0920 10/06/17 0240 10/07/17 0351  WBC 8.4 7.0 7.2  HGB 11.6* 11.8* 12.2*  HCT 37.2* 38.1* 39.4  MCV 87.7 89.0 88.5  PLT 163 158 701    Basic Metabolic Panel: Recent Labs  Lab 10/05/17 0920 10/06/17 0240 10/07/17 0351  NA 139 140 141  K 5.5* 4.3 4.4  CL 109 108 108  CO2 22 22 22   GLUCOSE 106* 90 96  BUN 29* 28* 33*  CREATININE 1.90* 1.85* 1.77*  CALCIUM 8.5* 8.8* 9.2    GFR: Estimated Creatinine Clearance: 24.5 mL/min (A) (by C-G formula based on SCr of 1.77 mg/dL (H)).  Liver Function Tests: No results for input(s): AST, ALT, ALKPHOS, BILITOT, PROT, ALBUMIN in the last 168 hours. No results for input(s): LIPASE, AMYLASE in the last 168 hours. No results for input(s): AMMONIA in the last 168 hours.  Coagulation Profile: No results for input(s): INR, PROTIME in the last 168 hours.  Cardiac Enzymes: Recent Labs  Lab 10/05/17 1521 10/05/17 2035 10/06/17 0240  TROPONINI 10.07* 10.29* 8.99*    BNP (last 3 results) No results for input(s): PROBNP in the last 8760 hours.  HbA1C: No results for input(s): HGBA1C in the last 72 hours.  CBG: No results for input(s): GLUCAP in the last 168 hours.  Lipid Profile: No results for input(s): CHOL, HDL, LDLCALC, TRIG, CHOLHDL, LDLDIRECT in the last 72 hours.  Thyroid Function Tests: No results for input(s): TSH, T4TOTAL, FREET4, T3FREE, THYROIDAB in the last 72 hours.  Anemia Panel: No results for input(s): VITAMINB12, FOLATE, FERRITIN, TIBC, IRON, RETICCTPCT in the last 72 hours.  Urine analysis:    Component Value Date/Time    COLORURINE YELLOW 04/09/2017 1400   APPEARANCEUR CLEAR 04/09/2017 1400   LABSPEC 1.018 04/09/2017 1400   PHURINE 6.0 04/09/2017 1400   GLUCOSEU NEGATIVE 04/09/2017 1400   HGBUR NEGATIVE 04/09/2017 1400   BILIRUBINUR NEGATIVE 04/09/2017 1400   KETONESUR NEGATIVE 04/09/2017 1400   PROTEINUR NEGATIVE 04/09/2017 1400   UROBILINOGEN 0.2 08/15/2012 1215   NITRITE NEGATIVE 04/09/2017 1400   LEUKOCYTESUR NEGATIVE 04/09/2017 1400    Sepsis Labs: Lactic Acid, Venous    Component Value Date/Time   LATICACIDVEN 0.8 01/22/2016 1717    MICROBIOLOGY: No results found for this or any previous visit (from the past 240 hour(s)).  RADIOLOGY STUDIES/RESULTS: Dg Chest 1 View  Result Date: 09/22/2017 CLINICAL DATA:  Status post left-sided thoracentesis EXAM: CHEST  1 VIEW COMPARISON:  Chest x-ray of September 15, 2017 FINDINGS: The volume of pleural fluid on the left has markedly decreased. Patchy linear density at the left lung base is present and there is obscuration of the left hemidiaphragm. There is a trace of pleural fluid blunting the right lateral costophrenic angle. The right lung is well-expanded and clear. There is a large hiatal  hernia. The heart and pulmonary vascularity are normal. The patient has undergone previous median sternotomy and internal mammary artery dissection. IMPRESSION: Interval decrease in the volume of pleural fluid on the left without evidence of postprocedure pneumothorax. Persistent left basilar atelectasis or pneumonia. New trace right pleural effusion. Electronically Signed   By: David  Martinique M.D.   On: 09/22/2017 11:25   Dg Chest 2 View  Result Date: 10/05/2017 CLINICAL DATA:  SOB and wheezing this AM; pt reports he was having pain but said he wasn't sure he could tell me where his pain was; former smoker EXAM: CHEST - 2 VIEW COMPARISON:  569 FINDINGS: Stable enlarged cardiac silhouette. Large hiatal hernia posterior to the heart. Increasing LEFT pleural effusion which  extends along the LEFT lateral chest wall superiorly. Mild central venous congestion is increased. IMPRESSION: 1. Increasing LEFT effusion and central venous congestion. 2. Large hiatal hernia. Electronically Signed   By: Suzy Bouchard M.D.   On: 10/05/2017 10:05   Dg Chest 2 View  Result Date: 09/15/2017 CLINICAL DATA:  Increased shortness of breath. Malignant pleural effusion. Primary adenocarcinoma of the upper lobe of the left lung. EXAM: CHEST - 2 VIEW COMPARISON:  Chest x-rays dated 09/03/2017, 08/08/2017 and 06/20/2017 and chest CT dated 08/01/2017 FINDINGS: There is a recurrent large left pleural effusion with compressive atelectasis of the left lung, similar to the appearance on the study of 06/20/2017. Right lung is clear.  Pulmonary vascularity is normal. Chronic large hiatal hernia no acute bone abnormality. Multiple surgical clips in the chest.  Previous median sternotomy. IMPRESSION: 1. Recurrent large left pleural effusion with compressive atelectasis of the left lung. 2. Chronic large hiatal hernia. Electronically Signed   By: Lorriane Shire M.D.   On: 09/15/2017 11:59   US Thoracentesis Asp Pleural Space W/img Guide  Result Date: 09/22/2017 INDICATION: Patient with history of metastatic adenocarcinoma of the lung with recurrent malignant left pleural effusion, dyspnea. Request made for diagnostic and therapeutic left thoracentesis. EXAM: ULTRASOUND GUIDED DIAGNOSTIC AND THERAPEUTIC LEFT THORACENTESIS MEDICATIONS: None COMPLICATIONS: None immediate. PROCEDURE: An ultrasound guided thoracentesis was thoroughly discussed with the patient and questions answered. The benefits, risks, alternatives and complications were also discussed. The patient understands and wishes to proceed with the procedure. Written consent was obtained. Ultrasound was performed to localize and mark an adequate pocket of fluid in the left chest. The area was then prepped and draped in the normal sterile fashion. 1%  Lidocaine was used for local anesthesia. Under ultrasound guidance a 6 Fr Safe-T-Centesis catheter was introduced. Thoracentesis was performed. The catheter was removed and a dressing applied. FINDINGS: A total of approximately 1.7 liters of slightly hazy, yellow fluid was removed. Samples were sent to the laboratory as requested by the clinical team. Due to patient chest discomfort only the above amount of fluid was removed today. IMPRESSION: Successful ultrasound guided diagnostic and therapeutic left thoracentesis yielding 1.7 liters of pleural fluid. Follow-up chest x-ray revealed no pneumothorax. Electronically Signed   By: Lucrezia Europe M.D.   On: 09/22/2017 11:30     LOS: 2 days   Oren Binet, MD  [facility]  If 7PM-7AM, please contact night-coverage  Please page via [email or web address]-Password [email or web address]-click on MD name and type text message  10/07/2017, 1:38 PM

## 2017-10-07 NOTE — Procedures (Signed)
PROCEDURE SUMMARY:  Successful US guided therapeutic left thoracentesis. Yielded 1.6 liters of yellow fluid. Pt tolerated procedure well. No immediate complications.  Specimen was not sent for labs. CXR ordered.  Docia Barrier PA-C 10/07/2017 4:05 PM

## 2017-10-07 NOTE — Progress Notes (Signed)
Referral for Home Hospice needs received- per CSW plan is for pt to return to his ALF at [facility]- per ALF they contract with [facility] for hospice needs. Have called [name] with [facility] for Home Hospice referral- msg left regarding referral- awaiting return call.

## 2017-10-07 NOTE — Progress Notes (Signed)
Patient confused this AM, patient pulled IV out and did not know he was in hosptital [facility], patient reoriented and cleaned up, gown and sheets changed. Chair alarm placed and patient placed in chair. Reoriented and reminded to call prior to getting up. Patient currently eating breakfast will monitor patient. Rozina Pointer, Bettina Gavia RN

## 2017-10-07 NOTE — Plan of Care (Signed)
?  Problem: Clinical Measurements: ?Goal: Will remain free from infection ?Outcome: Progressing ?  ?

## 2017-10-07 NOTE — Progress Notes (Signed)
Clinical Social Worker following family for support and discharge needs. Patient spoke with facility representative Raquel Sarna. Raquel Sarna stated that they should have a SNF bed waiting for patient at [facility] tomorrow. Lester has started trying to attain passar number from [place]. CSW will continue to follow patient until discharged.   Rhea Pink, MSW,  [place]

## 2017-10-07 NOTE — Progress Notes (Signed)
Clinical Social Worker following patient and family for support and discharge needs. CSW received a phone call from Kathie Rhodes a NP on [facility] team. Scott [name] stated that she meet with family and they had decided they would like patient to go back home to [facility] with Hospice to follow.   CSW reached out to family and spoke with Scott [name] (pateits daughter). Scott [name] conformed that at this time family has decided it would be better for patient to go back to his ALF with spouse. CSW made MD and RNCM on floor aware.  CSW reached out to facility to see how to get hospice to follow patient at [facility]. Facility stated they contract out to [facility]. CSW relayed information to [facility]. CSW signing off as social work intervention is no longer needed. Please consult social CSW again if needed.   Scott [name], MSW,  [place]

## 2017-10-07 NOTE — Progress Notes (Signed)
[facility]  Received request from [facility] for family interest in [facility] services after discharge. Chart reviewed and attempted visit at bedside. Attempted call to spouse but no answer. Spoke with daughter who reports spouse does not have cell phone and will not be home until 6PM. Will follow up with RNCM Kristi in am re discharge location.   Thank you,  Erling Conte, LCSW [phone]

## 2017-10-07 NOTE — Evaluation (Addendum)
Physical Therapy Evaluation Patient Details Name: Scott [name] MRN: [id number] DOB: [birth year] Today's Date: 10/07/2017   History of Present Illness  84 y.o. male  with past medical history of CAD s/p CABG, chronic diastolic CHF, CKD 3, HTN, HLD, GERD, and metastatic lung cancer on tagrisso w/ malignant left-sided pleural effusion admitted on 10/05/2017 with shoulder pain and shortness of breath. Found to have NSTEMI being managed conservatively with aspirin and IV heparin. He has had 4 thoracenteses this year - last one 2 weeks ago.  Clinical Impression  Pt admitted with above diagnosis. Pt currently with functional limitations due to the deficits listed below (see PT Problem List). Pt was able to ambulate with RW in hallway but cannot withstand challenges to balance without LOB.  Pt confused and unaware of where he is.  Will need SNF for rehab to gain strength and endurance. Pt used 2L O2 prior to admit.  He was on 1L on PT arrival with sats 100%.  With ambulation, desat to 82% on RA and needed 3LO2 to keep sats >90%.  Other VSS.  Will follow acutely.   Pt will benefit from skilled PT to increase their independence and safety with mobility to allow discharge to the venue listed below.    Follow Up Recommendations SNF;Supervision/Assistance - 24 hour    Equipment Recommendations  Other (comment)(TBA)    Recommendations for Other Services       Precautions / Restrictions Precautions Precautions: Fall Restrictions Weight Bearing Restrictions: No      Mobility  Bed Mobility               General bed mobility comments: Pt in chair on arrival.   Transfers Overall transfer level: Needs assistance Equipment used: Rolling walker (2 wheeled) Transfers: Sit to/from Stand Sit to Stand: Min assist;+2 safety/equipment         General transfer comment: Pt needed cues for hand placement as he wants to pull up on RW. Slight steadying assist needed once up on feet.    Ambulation/Gait Ambulation/Gait assistance: Min assist;+2 safety/equipment Ambulation Distance (Feet): 220 Feet Assistive device: Rolling walker (2 wheeled) Gait Pattern/deviations: Step-through pattern;Decreased stride length;Trunk flexed;Wide base of support   Gait velocity interpretation: <1.31 ft/sec, indicative of household ambulator General Gait Details: Pt was able to ambulate with RW with min assist and cues.  Slow and steady gait overall with RW unless challenged.  Pt cannot withstand any challenges to balance.  Pt with slightly flexed posture which worsens as he fatigues.  Needed cues to stay close to RW as well.  Decr safety awareness.    Stairs            Wheelchair Mobility    Modified Rankin (Stroke Patients Only)       Balance Overall balance assessment: Needs assistance Sitting-balance support: No upper extremity supported;Feet supported Sitting balance-Leahy Scale: Fair     Standing balance support: Bilateral upper extremity supported;During functional activity Standing balance-Leahy Scale: Poor Standing balance comment: relies on UE support for balance                              Pertinent Vitals/Pain Pain Assessment: No/denies pain    Home Living Family/patient expects to be discharged to:: Skilled nursing facility Living Arrangements: Spouse/significant other Available Help at Discharge: Family;Available 24 hours/day Type of Home: Assisted living Home Access: Level entry     Home Layout: One level Home Equipment: Cane - single  point;Shower seat;Walker - 4 wheels;Grab bars - toilet;Grab bars - tub/shower(2LO2) Additional Comments: pt and wife live at [facility]. Wife reports that pt often forgetful     Prior Function Level of Independence: Independent with assistive device(s)         Comments: ambulated with rollator per pt however pt a questionable historian as he has been confused.      Hand Dominance    Dominant Hand: Right    Extremity/Trunk Assessment   Upper Extremity Assessment Upper Extremity Assessment: Defer to OT evaluation    Lower Extremity Assessment Lower Extremity Assessment: Generalized weakness    Cervical / Trunk Assessment Cervical / Trunk Assessment: Normal  Communication   Communication: No difficulties  Cognition Arousal/Alertness: Awake/alert Behavior During Therapy: Flat affect Overall Cognitive Status: History of cognitive impairments - at baseline                                 General Comments: Confused.  Not oriented to place time or situation.  [facility]       General Comments      Exercises     Assessment/Plan    PT Assessment Patient needs continued PT services  PT Problem List Decreased activity tolerance;Decreased balance;Decreased mobility;Decreased knowledge of use of DME;Decreased safety awareness;Decreased knowledge of precautions;Cardiopulmonary status limiting activity;Decreased cognition       PT Treatment Interventions DME instruction;Gait training;Functional mobility training;Therapeutic activities;Therapeutic exercise;Balance training;Patient/family education    PT Goals (Current goals can be found in the Care Plan section)  Acute Rehab PT Goals Patient Stated Goal: pt confused and unable to state PT Goal Formulation: With patient Time For Goal Achievement: 10/21/17 Potential to Achieve Goals: Good    Frequency Min 3X/week   Barriers to discharge        Co-evaluation               AM-PAC PT "6 Clicks" Daily Activity  Outcome Measure Difficulty turning over in bed (including adjusting bedclothes, sheets and blankets)?: Unable Difficulty moving from lying on back to sitting on the side of the bed? : Unable Difficulty sitting down on and standing up from a chair with arms (e.g., wheelchair, bedside commode, etc,.)?: A Lot Help needed moving to and from a bed to chair (including a  wheelchair)?: A Lot Help needed walking in hospital room?: A Lot Help needed climbing 3-5 steps with a railing? : Total 6 Click Score: 9    End of Session Equipment Utilized During Treatment: Gait belt;Oxygen Activity Tolerance: Patient limited by fatigue Patient left: in chair;with call bell/phone within reach;with chair alarm set Nurse Communication: Mobility status PT Visit Diagnosis: Unsteadiness on feet (R26.81);Muscle weakness (generalized) (M62.81)    Time: 2947-6546 PT Time Calculation (min) (ACUTE ONLY): 17 min   Charges:   PT Evaluation $PT Eval Moderate Complexity: 1 Mod     PT G Codes:        Ether Wolters,PT Acute Rehabilitation [phone] (pager)   Denice Paradise 10/07/2017, 10:00 AM

## 2017-10-07 NOTE — Progress Notes (Signed)
Daily Progress Note   Patient Name: Scott [name]       Date: 10/07/2017 DOB: 1923/06/08  Age: 84 y.o. MRN#: [id number] Attending Physician: Jonetta Osgood, MD Primary Care Physician: [name], MD Admit Date: 10/05/2017  Reason for Consultation/Follow-up: Establishing goals of care and Hospice Evaluation  Subjective: Tells me he woke up very confused, didn't know where he was, and the IV tubing and cardiac monitors are bothering him. Tells me he is oriented now. Denies pain, shortness of breath.  Length of Stay: 2  Current Medications: Scheduled Meds:  . aspirin  324 mg Oral Once  . aspirin EC  81 mg Oral Daily  . buPROPion  150 mg Oral Daily  . fluticasone furoate-vilanterol  1 puff Inhalation Daily  . isosorbide mononitrate  30 mg Oral Daily  . pantoprazole  40 mg Oral Daily  . sodium chloride flush  3 mL Intravenous Q12H  . sodium chloride flush  3 mL Intravenous Q12H  . venlafaxine XR  37.5 mg Oral Q breakfast    Continuous Infusions: . sodium chloride    . heparin 850 Units/hr (10/05/17 1312)    PRN Meds: sodium chloride, acetaminophen **OR** acetaminophen, albuterol, nitroGLYCERIN, ondansetron (ZOFRAN) IV, sodium chloride flush  Physical Exam  Constitutional: He is oriented to person, place, and time. He appears well-nourished. He does not appear ill.  HENT:  Head: Normocephalic and atraumatic.  Cardiovascular: An irregular rhythm present.  Pulmonary/Chest: Effort normal. No accessory muscle usage. No tachypnea. No respiratory distress.  Abdominal: Soft.  Musculoskeletal:       Right lower leg: He exhibits edema.       Left lower leg: He exhibits edema.  Neurological: He is alert and oriented to person, place, and time.  Periods of confusion, easily redirected    Skin: Skin is warm and dry.  Psychiatric: He has a normal mood and affect. His behavior is normal.            Vital Signs: BP 138/83   Pulse 79   Temp 98.2 F (36.8 C) (Oral)   Resp (!) 24   Ht 5\' 6"  (1.676 m)   Wt 69.6 kg (153 lb 7 oz)   SpO2 97%   BMI 24.77 kg/m  SpO2: SpO2: 97 % O2 Device: O2 Device: Nasal Cannula O2 Flow Rate: O2 Flow Rate (L/min): 1 L/min  Intake/output summary:   Intake/Output Summary (Last 24 hours) at 10/07/2017 1014 Last data filed at 10/07/2017 0234 Gross per 24 hour  Intake 484.81 ml  Output 1050 ml  Net -565.19 ml   LBM: Last BM Date: 10/05/17 Baseline Weight: Weight: 68.9 kg (152 lb) Most recent weight: Weight: 69.6 kg (153 lb 7 oz)       Palliative Assessment/Data: PPS 40%    Flowsheet Rows     Most Recent Value  Intake Tab  Referral Department  Hospitalist  Unit at Time of Referral  Intermediate Care Unit  Palliative Care Primary Diagnosis  Cancer  Date Notified  10/05/17  Palliative Care Type  New Palliative care  Reason for referral  Clarify Goals of Care  Date of Admission  10/05/17  Date first seen by Palliative Care  10/06/17  #  of days Palliative referral response time  1 Day(s)  # of days IP prior to Palliative referral  0  Clinical Assessment  Palliative Performance Scale Score  40%  Psychosocial & Spiritual Assessment  Palliative Care Outcomes  Patient/Family meeting held?  Yes  Who was at the meeting?  patient, daughter, wife, granddaughter  Palliative Care Outcomes  Clarified goals of care, Provided end of life care assistance, Provided psychosocial or spiritual support, Transitioned to hospice, Counseled regarding hospice      Patient Active Problem List   Diagnosis Date Noted  . Palliative care by specialist   . NSTEMI (non-ST elevated myocardial infarction) ([place]) 10/05/2017  . CKD (chronic kidney disease), stage III ([place]) 10/05/2017  . Chronic diastolic CHF (congestive heart failure) ([place]) 10/05/2017  .  Physical deconditioning 09/17/2017  . Hypoxemia 09/17/2017  . Recurrent left pleural effusion 08/12/2017  . Internal hemorrhoids 08/12/2017  . Goals of care, counseling/discussion 08/12/2017  . Fall 07/16/2017  . Primary adenocarcinoma of upper lobe of left lung ([place]) 04/07/2017  . Decreased libido 08/26/2016  . Bradycardia, toprol stopped 10/15/2014  . CAD (coronary artery disease) of artery bypass graft, ALL VGs occluded, patent LIMA -->LAD, patent dominant RCA 10/15/2014  . Anemia 03/24/2013  . Essential hypertension   . Anxiety   . Depression   . GERD (gastroesophageal reflux disease)   . Hyperlipidemia   . Coronary artery disease due to lipid rich plaque   . HIATAL HERNIA 09/21/2008  . CT, CHEST, ABNORMAL 09/21/2008    Palliative Care Assessment & Plan   HPI: 84 y.o. male  with past medical history of CAD s/p CABG, chronic diastolic CHF, CKD 3, HTN, HLD, GERD, and metastatic lung cancer on tagrisso w/ malignant left-sided pleural effusion admitted on 10/05/2017 with shoulder pain and shortness of breath. Found to have NSTEMI being managed conservatively with aspirin and IV heparin. He has had 4 thoracenteses this year - last one 2 weeks ago.  PMT consulted for [employer].   Assessment: Follow up today with patient and his daughter, Scott [name]. Symptoms controlled. Feels well other than period of confusion/delerium this morning.   Discussed discharge plan with patient and Scott [name] - today Scott [name] tells me she is interested in SNF rehab - however, there is not a SNF bed available at [facility] where the patient's wife lives and this makes this option less desirable. Then we further discussed hospice philosophy.   After discussion, Scott [name] tells me she thinks the best option would be for patient to return home - independent living at [facility] - with hospice support. She believes the family can work out a plan to provide the patient the care he needs. She also sees that the patient  is getting confused being out of his familiar environment and thinks that returning home instead of another environment he is unfamiliar with may benefit him best.   Called patient's other daughter, Scott [name], who is out of town to update her on plan per family request.  She agrees.   Recommendations/Plan:  Suggest PleurX placement prior to discharge if team agrees this is appropriate for patient - he does not want to undergo repeat thoracenteses but agreeable to pleurX placement for symptom control  Patient and family would like patient to return home - independent living at [facility] - w/support of hospice.   Goals of Care and Additional Recommendations:  Limitations on Scope of Treatment: Avoid Hospitalization  Code Status:  DNR  Prognosis:   < 6 months d/t metastatic  lung cancer, poor functional and nutritional status   Discharge Planning:  Home with Hospice - independent living at [place] [facility] was discussed with patient, daughters, text paged Dr. Sloan Leiter, [place] updated  Thank you for allowing the Palliative Medicine Team to assist in the care of this patient.   Total Time 40 minutes Prolonged Time Billed  no       Greater than 50%  of this time was spent counseling and coordinating care related to the above assessment and plan.  Juel Burrow, DNP, AGNP-C Palliative Medicine Team Team Phone # [phone]

## 2017-10-07 NOTE — Progress Notes (Signed)
ANTICOAGULATION CONSULT NOTE  Pharmacy Consult:  Heparin Indication: chest pain/ACS  No Known Allergies  Patient Measurements: Height: 5\' 6"  (167.6 cm) Weight: 153 lb 7 oz (69.6 kg) IBW/kg (Calculated) : 63.8 Heparin Dosing Weight: 69 kg  Vital Signs: BP: 138/83 (05/21 0805) Pulse Rate: 79 (05/21 0805)  Labs: Recent Labs    10/05/17 0920 10/05/17 1521 10/05/17 2035 10/06/17 0240 10/07/17 0351  HGB 11.6*  --   --  11.8* 12.2*  HCT 37.2*  --   --  38.1* 39.4  PLT 163  --   --  158 169  HEPARINUNFRC  --   --  0.59 0.56 0.54  CREATININE 1.90*  --   --  1.85* 1.77*  TROPONINI  --  10.07* 10.29* 8.99*  --    Estimated Creatinine Clearance: 24.5 mL/min (A) (by C-G formula based on SCr of 1.77 mg/dL (H)).  Assessment: 46 YOM presented with chest pain and SOB.  Pharmacy consulted to initiate IV heparin for ACS.  Troponin elevated at 5.44.   Heparin level remains therapeutic, CBC stable, Patient confused and pulled out IV this morning; RN was able to control bleeding restart IV heparin via another IV site on opposite arm.   Goal of Therapy:  Heparin level 0.3-0.7 units/ml Monitor platelets by anticoagulation protocol: Yes   Plan:  Continue Heparin gtt at 850 units/hr Daily heparin level and CBC  Georga Bora, PharmD Clinical Pharmacist 10/07/2017 10:03 AM

## 2017-10-08 ENCOUNTER — Other Ambulatory Visit: Payer: Self-pay

## 2017-10-08 DIAGNOSIS — R2681 Unsteadiness on feet: Secondary | ICD-10-CM | POA: Diagnosis not present

## 2017-10-08 DIAGNOSIS — I700 Atherosclerosis of aorta: Secondary | ICD-10-CM | POA: Diagnosis not present

## 2017-10-08 DIAGNOSIS — I2510 Atherosclerotic heart disease of native coronary artery without angina pectoris: Secondary | ICD-10-CM | POA: Diagnosis not present

## 2017-10-08 DIAGNOSIS — R918 Other nonspecific abnormal finding of lung field: Secondary | ICD-10-CM | POA: Diagnosis not present

## 2017-10-08 DIAGNOSIS — R55 Syncope and collapse: Secondary | ICD-10-CM | POA: Diagnosis not present

## 2017-10-08 DIAGNOSIS — N400 Enlarged prostate without lower urinary tract symptoms: Secondary | ICD-10-CM | POA: Diagnosis not present

## 2017-10-08 DIAGNOSIS — R41841 Cognitive communication deficit: Secondary | ICD-10-CM | POA: Diagnosis not present

## 2017-10-08 DIAGNOSIS — K5732 Diverticulitis of large intestine without perforation or abscess without bleeding: Secondary | ICD-10-CM | POA: Diagnosis not present

## 2017-10-08 DIAGNOSIS — M6281 Muscle weakness (generalized): Secondary | ICD-10-CM | POA: Diagnosis not present

## 2017-10-08 DIAGNOSIS — R5381 Other malaise: Secondary | ICD-10-CM | POA: Diagnosis not present

## 2017-10-08 DIAGNOSIS — N183 Chronic kidney disease, stage 3 (moderate): Secondary | ICD-10-CM | POA: Diagnosis not present

## 2017-10-08 DIAGNOSIS — I214 Non-ST elevation (NSTEMI) myocardial infarction: Secondary | ICD-10-CM | POA: Diagnosis not present

## 2017-10-08 DIAGNOSIS — C3412 Malignant neoplasm of upper lobe, left bronchus or lung: Secondary | ICD-10-CM | POA: Diagnosis not present

## 2017-10-08 DIAGNOSIS — I25700 Atherosclerosis of coronary artery bypass graft(s), unspecified, with unstable angina pectoris: Secondary | ICD-10-CM | POA: Diagnosis not present

## 2017-10-08 DIAGNOSIS — K219 Gastro-esophageal reflux disease without esophagitis: Secondary | ICD-10-CM | POA: Diagnosis not present

## 2017-10-08 DIAGNOSIS — Z5112 Encounter for antineoplastic immunotherapy: Secondary | ICD-10-CM | POA: Diagnosis not present

## 2017-10-08 DIAGNOSIS — R278 Other lack of coordination: Secondary | ICD-10-CM | POA: Diagnosis not present

## 2017-10-08 DIAGNOSIS — R531 Weakness: Secondary | ICD-10-CM | POA: Diagnosis not present

## 2017-10-08 DIAGNOSIS — B029 Zoster without complications: Secondary | ICD-10-CM | POA: Diagnosis not present

## 2017-10-08 DIAGNOSIS — F418 Other specified anxiety disorders: Secondary | ICD-10-CM | POA: Diagnosis not present

## 2017-10-08 DIAGNOSIS — J910 Malignant pleural effusion: Secondary | ICD-10-CM | POA: Diagnosis not present

## 2017-10-08 DIAGNOSIS — E785 Hyperlipidemia, unspecified: Secondary | ICD-10-CM | POA: Diagnosis not present

## 2017-10-08 DIAGNOSIS — C3490 Malignant neoplasm of unspecified part of unspecified bronchus or lung: Secondary | ICD-10-CM | POA: Diagnosis not present

## 2017-10-08 DIAGNOSIS — J90 Pleural effusion, not elsewhere classified: Secondary | ICD-10-CM | POA: Diagnosis not present

## 2017-10-08 DIAGNOSIS — F325 Major depressive disorder, single episode, in full remission: Secondary | ICD-10-CM | POA: Diagnosis not present

## 2017-10-08 DIAGNOSIS — J9611 Chronic respiratory failure with hypoxia: Secondary | ICD-10-CM | POA: Diagnosis not present

## 2017-10-08 DIAGNOSIS — K449 Diaphragmatic hernia without obstruction or gangrene: Secondary | ICD-10-CM | POA: Diagnosis not present

## 2017-10-08 DIAGNOSIS — E611 Iron deficiency: Secondary | ICD-10-CM | POA: Diagnosis not present

## 2017-10-08 DIAGNOSIS — R0600 Dyspnea, unspecified: Secondary | ICD-10-CM | POA: Diagnosis not present

## 2017-10-08 DIAGNOSIS — D509 Iron deficiency anemia, unspecified: Secondary | ICD-10-CM | POA: Diagnosis not present

## 2017-10-08 DIAGNOSIS — I130 Hypertensive heart and chronic kidney disease with heart failure and stage 1 through stage 4 chronic kidney disease, or unspecified chronic kidney disease: Secondary | ICD-10-CM | POA: Diagnosis not present

## 2017-10-08 DIAGNOSIS — Z86010 Personal history of colonic polyps: Secondary | ICD-10-CM | POA: Diagnosis not present

## 2017-10-08 DIAGNOSIS — R4189 Other symptoms and signs involving cognitive functions and awareness: Secondary | ICD-10-CM | POA: Diagnosis not present

## 2017-10-08 DIAGNOSIS — R1312 Dysphagia, oropharyngeal phase: Secondary | ICD-10-CM | POA: Diagnosis not present

## 2017-10-08 DIAGNOSIS — I10 Essential (primary) hypertension: Secondary | ICD-10-CM | POA: Diagnosis not present

## 2017-10-08 DIAGNOSIS — I5032 Chronic diastolic (congestive) heart failure: Secondary | ICD-10-CM | POA: Diagnosis not present

## 2017-10-08 LAB — CBC
HCT: 36.9 % — ABNORMAL LOW (ref 39.0–52.0)
Hemoglobin: 11.5 g/dL — ABNORMAL LOW (ref 13.0–17.0)
MCH: 27.5 pg (ref 26.0–34.0)
MCHC: 31.2 g/dL (ref 30.0–36.0)
MCV: 88.3 fL (ref 78.0–100.0)
PLATELETS: 157 10*3/uL (ref 150–400)
RBC: 4.18 MIL/uL — AB (ref 4.22–5.81)
RDW: 14.2 % (ref 11.5–15.5)
WBC: 7.5 10*3/uL (ref 4.0–10.5)

## 2017-10-08 MED ORDER — CLOPIDOGREL BISULFATE 75 MG PO TABS: 75.0000 mg | ORAL_TABLET | Freq: Every day | ORAL | 0 refills | Status: DC

## 2017-10-08 MED ORDER — ATORVASTATIN CALCIUM 40 MG PO TABS: 40.0000 mg | ORAL_TABLET | Freq: Every day | ORAL | 0 refills | Status: DC

## 2017-10-08 MED ORDER — ASPIRIN 81 MG PO TBEC: 81.0000 mg | DELAYED_RELEASE_TABLET | Freq: Every day | ORAL | Status: DC

## 2017-10-08 NOTE — Consult Note (Signed)
            [facility] CM Primary Care Navigator  10/08/2017  Scott [name] 28-Jun-1923 [id number]   Went to see patient at the bedside and he was fast asleep but was able to meet with granddaughters (Abigail and Anna Genre) to identify possible discharge needs.  Per granddaughters' report, patient is a resident at [facility] for about 4 years and being seen by facility physician Dr. [name] at the assisted living facility.   Granddaughters confirmed that patient/ family's anticipated plan is to return back to [facility] with Hospice care and services.   Per MD note, patient was found to have non-STEMI (non-ST elevated myocardial infarction) and was evaluated by cardiology with recommendations to pursue medical/ conservative management since he is not a candidate for aggressive care given his advanced age and history of underlying stage IV adenocarcinoma of the lung.  Noted no health management needs at this point.   For additional questions please contact:  Edwena Felty A. Stasha Naraine, BSN, RN-BC [facility] PRIMARY CARE Navigator Cell: [phone]

## 2017-10-08 NOTE — Clinical Social Work Note (Addendum)
Spoke with admissions coordinator at [facility]. They are going to try patient with rehab. MD aware and will change disposition in discharge summary.  Dayton Scrape, CSW [phone]  1:48 pm [facility] is ready to accept patient on their SNF side.  Dayton Scrape, [place]

## 2017-10-08 NOTE — Clinical Social Work Note (Signed)
CSW facilitated patient discharge including contacting patient family and facility to confirm patient discharge plans. Clinical information faxed to facility and family agreeable with plan. CSW arranged ambulance transport via [facility] to [facility]. RN to call report prior to discharge [phone]).  CSW will sign off for now as social work intervention is no longer needed. Please consult [place] again if new needs arise.  Dayton Scrape, [place]

## 2017-10-08 NOTE — Care Management Note (Signed)
Case Management Note Marvetta Gibbons RN, BSN Unit 4E-Case Manager [phone]  Patient Details  Name: Scott [name] MRN: [id number] Date of Birth: 09/21/1923  Subjective/Objective:  Pt admitted with NSTEMI, hx of metastatic adenocarcinoma of the lung with recurrent left-sided pleural effusion.              Action/Plan: PTA pt lived at [place] with wife- CSW consulted for possible need of SNF at [facility] has met with family and decision per family is for pt to return to ALF with wife- CM received referral for Home Hospice at [place]- per [name] contracts with [facility] for hospice services- call has been made to [name] with [facility] for [place] referral have received call back and [facility] will f/u on referral and reach out to family- ? D/C 5/22 if hospice arrangements in place and pt stable for transition back to ALF. CM and CSW to continue to follow for transition of care needs.   Expected Discharge Date:  10/08/17               Expected Discharge Plan:  Assisted Living / Rest Home  In-House Referral:  Clinical Social Work  Discharge planning Services  CM Consult  Post Acute Care Choice:  Hospice Choice offered to:  Adult Children, NA  DME Arranged:    DME Agency:     HH Arranged:    HH Agency:     Status of Service:  Completed, signed off  If discussed at [place] of Stay Meetings, dates discussed:    Discharge Disposition: Skilled facility   Additional Comments:  10/08/17- 1200- Marvetta Gibbons RN, CM- spoke with pt and granddaughters at the bedside along with [place] and [place] with [facility]- discussed transition of care needs - with explanation of [place] vs ALF vs STSNF- family is not prepared to provide 24/7 care at this time- TC made to pt's daughter out of state who agrees plan at this time needs to be for pt to go to [facility] then family can work on plan to make arrangements for [place]- family provided info on private duty google search for private duty assistance- CSW will assist pt and family with STSNF placement at [facility].   Dawayne Patricia, RN 10/08/2017, 1:17 PM

## 2017-10-08 NOTE — NC FL2 (Signed)
[place] LEVEL OF CARE SCREENING TOOL     IDENTIFICATION  Patient Name: Scott [name] Birthdate: 04/17/1924 Sex: male Admission Date (Current Location): 10/05/2017  [facility] and [place] Number:  Herbalist and Address:  [facility], [place] [address] [place], [place] [address]      Provider Number: [id number]  Attending Physician Name and Address:  Jonetta Osgood, MD  Relative Name and Phone Number:  Sheryl Towell, [phone]    Current Level of Care: Hospital Recommended Level of Care: [place] Prior Approval Number:    Date Approved/Denied:   PASRR Number:    Discharge Plan: SNF    Current Diagnoses: Patient Active Problem List   Diagnosis Date Noted  . Palliative care by specialist   . NSTEMI (non-ST elevated myocardial infarction) ([place]) 10/05/2017  . CKD (chronic kidney disease), stage III ([place]) 10/05/2017  . Chronic diastolic CHF (congestive heart failure) ([place]) 10/05/2017  . Physical deconditioning 09/17/2017  . Hypoxemia 09/17/2017  . Recurrent left pleural effusion 08/12/2017  . Internal hemorrhoids 08/12/2017  . Goals of care, counseling/discussion 08/12/2017  . Fall 07/16/2017  . Primary adenocarcinoma of upper lobe of left lung ([place]) 04/07/2017  . Decreased libido 08/26/2016  . Bradycardia, toprol stopped 10/15/2014  . CAD (coronary artery disease) of artery bypass graft, ALL VGs occluded, patent LIMA -->LAD, patent dominant RCA 10/15/2014  . Anemia 03/24/2013  . Essential hypertension   . Anxiety   . Depression   . GERD (gastroesophageal reflux disease)   . Hyperlipidemia   . Coronary artery disease due to lipid rich plaque   . HIATAL HERNIA 09/21/2008  . CT, CHEST, ABNORMAL 09/21/2008    Orientation RESPIRATION BLADDER Height & Weight     Self, Time  O2(1L) Incontinent Weight: 144 lb 8 oz (65.5 kg) Height:  5\' 6"  (167.6 cm)  BEHAVIORAL SYMPTOMS/MOOD NEUROLOGICAL BOWEL  NUTRITION STATUS   None  None Continent Diet(heart healthy)  AMBULATORY STATUS COMMUNICATION OF NEEDS Skin Abrasion   Limited Assist Verbally                         Personal Care Assistance Level of Assistance  Bathing, Feeding, Dressing Bathing Assistance: Limited assistance Feeding assistance: Independent Dressing Assistance: Limited assistance     Functional Limitations Info  Sight, Hearing, Speech Sight Info: Adequate Hearing Info: Adequate Speech Info: Adequate    SPECIAL CARE FACTORS FREQUENCY  PT (By licensed PT), OT (By licensed OT)     PT Frequency: 5 x week OT Frequency: 5 x week            Contractures Contractures Info: Not present    Additional Factors Info  Code Status, Allergies Code Status Info: DNR Allergies Info: no known allergies           Current Medications (10/08/2017):  This is the current hospital active medication list Current Facility-Administered Medications  Medication Dose Route Frequency Provider Last Rate Last Dose  . acetaminophen (TYLENOL) tablet 650 mg  650 mg Oral Q6H PRN Samuella Cota, MD   650 mg at 10/05/17 2112   Or  . acetaminophen (TYLENOL) suppository 650 mg  650 mg Rectal Q6H PRN Samuella Cota, MD      . albuterol (PROVENTIL) (2.5 MG/3ML) 0.083% nebulizer solution 2.5 mg  2.5 mg Nebulization Q2H PRN Samuella Cota, MD      . aspirin chewable tablet 324 mg  324 mg  Oral Once Samuella Cota, MD      . aspirin EC tablet 81 mg  81 mg Oral Daily Samuella Cota, MD   81 mg at 10/08/17 1093  . atorvastatin (LIPITOR) tablet 40 mg  40 mg Oral q1800 Jonetta Osgood, MD   40 mg at 10/07/17 1721  . buPROPion (WELLBUTRIN SR) 12 hr tablet 150 mg  150 mg Oral Daily Samuella Cota, MD   150 mg at 10/08/17 2355  . clopidogrel (PLAVIX) tablet 75 mg  75 mg Oral Daily Jonetta Osgood, MD   75 mg at 10/08/17 7322  . enoxaparin (LOVENOX) injection 30 mg  30 mg Subcutaneous Daily Jonetta Osgood, MD   30  mg at 10/07/17 1721  . fluticasone furoate-vilanterol (BREO ELLIPTA) 100-25 MCG/INH 1 puff  1 puff Inhalation Daily Susa Raring, RPH   1 puff at 10/08/17 0836  . isosorbide mononitrate (IMDUR) 24 hr tablet 30 mg  30 mg Oral Daily Samuella Cota, MD   30 mg at 10/08/17 0254  . lidocaine (XYLOCAINE) 2 % injection    PRN Docia Barrier, PA   10 mL at 10/07/17 1612  . nitroGLYCERIN (NITROSTAT) SL tablet 0.4 mg  0.4 mg Sublingual Q5 Min x 3 PRN Samuella Cota, MD      . ondansetron [facility]) injection 4 mg  4 mg Intravenous Q6H PRN Samuella Cota, MD      . pantoprazole (PROTONIX) EC tablet 40 mg  40 mg Oral Daily Samuella Cota, MD   40 mg at 10/08/17 2706  . sodium chloride flush (NS) 0.9 % injection 3 mL  3 mL Intravenous Q12H Samuella Cota, MD   3 mL at 10/07/17 2208  . venlafaxine XR (EFFEXOR-XR) 24 hr capsule 37.5 mg  37.5 mg Oral Q breakfast Samuella Cota, MD   37.5 mg at 10/07/17 2376     Discharge Medications: Please see discharge summary for a list of discharge medications.  Relevant Imaging Results:  Relevant Lab Results:   Additional Information SS# [id number]  Candie Chroman, LCSW

## 2017-10-08 NOTE — Discharge Summary (Addendum)
PATIENT DETAILS Name: Scott [name] Age: 84 y.o. Sex: male Date of Birth: Apr 30, 1924 MRN: [id number]. Admitting Physician: Samuella Cota, MD QIO:[id number], Karalee Height, MD  Admit Date: 10/05/2017 Discharge date: 10/08/2017  Recommendations for Outpatient Follow-up:  1. Follow up with PCP in 1-2 weeks 2. Please obtain BMP/CBC in one week 3. Please ensure follow up with cardiology and Oncology 4. Please ensure palliative care/hospice follow-up at SNF  Admitted From:  ALF   Disposition: SNF   Home Health: No  Equipment/Devices: None  Discharge Condition: Stable  CODE STATUS:  DNR  Diet recommendation:  Heart Healthy   Brief Summary: See H&P, Labs, Consult and Test reports for all details in brief,Patient is a 84 y.o. male with history of metastatic left lung adenocarcinoma on immunotherapy, recurrent malignant left-sided pleural effusion, history of CAD status post CABG, chronic kidney disease stage III-with chest pain and shortness of breath, found to have non-STEMI. He was evaluated by cardiology with recommendations to pursue medical/conservative management.  See below for further details  Brief Hospital Course: Non-STEMI: Improved, chest pain-free-completed 48 hours of IV heparin-will be transitioned to ASA and Plavix for one month, following which can be just maintained on ASA. Continues to tolerate Imdur and Statin without issues, if BP is a concern in the future add low dose Beta blocker.  As noted in prior notes, evaluated by cardiology, not a candidate for aggressive care-we are pursuing conservative management given his advanced age and history of underlying stage IV adenocarcinoma of the lung.  Metastatic left lung adenocarcinoma with recurrent pleural effusion: On immunotherapy which was held, will be resumed on discharge.  Spoke with patient's primary oncologist, subsequently spoke with patient and daughter at bedside-we will hold off on placing a Pleurex  catheter until thoracocentesis becomes more frequent, did have a therapeutic thoracocentesis on 5/21.   Chronic diastolic heart failure: Euvolemic-follow volume status closely.  Chronic kidney disease stage III: Creatinine close to usual baseline, follow periodically.  Hypertension: Controlled-continue Imdur  GERD: Continue PPI  Depression: Appears to be reasonably stable-continue Pristiq  Deconditioning/debility  Palliative care: DNR in place-unfortunate 84 year old with non-STEMI in a background of metastatic adenocarcinoma of the lung with recurrent left-sided pleural effusion.  Family/patient want to pursue gentle medical treatment, although patient intially agreed to go SNF, he subsequently changed his mind, and now wants to go back to ALF with hospice services.   5/22 1 PM addendum: Informed by social work-that family is now trying to get the patient to SNF rather than going back to ALF with hospice care.  Procedures/Studies: None  Discharge Diagnoses:  Principal Problem:   NSTEMI (non-ST elevated myocardial infarction) ([place]) Active Problems:   Primary adenocarcinoma of upper lobe of left lung (HCC)   CKD (chronic kidney disease), stage III (HCC)   Chronic diastolic CHF (congestive heart failure) ([place])   Palliative care by specialist   Discharge Instructions:  Activity:  As tolerated with Full fall precautions use walker/cane & assistance as needed  Discharge Instructions    Call MD for:  difficulty breathing, headache or visual disturbances   Complete by:  As directed    Call MD for:  persistant nausea and vomiting   Complete by:  As directed    Call MD for:  severe uncontrolled pain   Complete by:  As directed    Diet - low sodium heart healthy   Complete by:  As directed    Discharge instructions   Complete by:  As directed  Follow with Primary MD  [name], MD in 1 week  Follow with Dr Irene Limbo in 1-2 week    Please get a complete blood count  and chemistry panel checked by your Primary MD at your next visit, and again as instructed by your Primary MD.  Get Medicines reviewed and adjusted: Please take all your medications with you for your next visit with your Primary MD  Laboratory/radiological data: Please request your Primary MD to go over all hospital tests and procedure/radiological results at the follow up, please ask your Primary MD to get all Hospital records sent to his/her office.  In some cases, they will be blood work, cultures and biopsy results pending at the time of your discharge. Please request that your primary care M.D. follows up on these results.  Also Note the following: If you experience worsening of your admission symptoms, develop shortness of breath, life threatening emergency, suicidal or homicidal thoughts you must seek medical attention immediately by calling 911 or calling your MD immediately  if symptoms less severe.  You must read complete instructions/literature along with all the possible adverse reactions/side effects for all the Medicines you take and that have been prescribed to you. Take any new Medicines after you have completely understood and accpet all the possible adverse reactions/side effects.   Do not drive when taking Pain medications or sleeping medications (Benzodaizepines)  Do not take more than prescribed Pain, Sleep and Anxiety Medications. It is not advisable to combine anxiety,sleep and pain medications without talking with your primary care practitioner  Special Instructions: If you have smoked or chewed Tobacco  in the last 2 yrs please stop smoking, stop any regular Alcohol  and or any Recreational drug use.  Wear Seat belts while driving.  Please note: You were cared for by a hospitalist during your hospital stay. Once you are discharged, your primary care physician will handle any further medical issues. Please note that NO REFILLS for any discharge medications will be  authorized once you are discharged, as it is imperative that you return to your primary care physician (or establish a relationship with a primary care physician if you do not have one) for your post hospital discharge needs so that they can reassess your need for medications and monitor your lab values.   Increase activity slowly   Complete by:  As directed      Allergies as of 10/08/2017   No Known Allergies     Medication List    STOP taking these medications   amLODipine 5 MG tablet Commonly known as:  NORVASC     TAKE these medications   acetaminophen 500 MG tablet Commonly known as:  TYLENOL Take 500 mg by mouth every 6 (six) hours as needed for mild pain or moderate pain.   aspirin 81 MG EC tablet Take 1 tablet (81 mg total) by mouth daily.   atorvastatin 40 MG tablet Commonly known as:  LIPITOR Take 1 tablet (40 mg total) by mouth daily at 6 PM.   BREO ELLIPTA 100-25 MCG/INH Aepb Generic drug:  fluticasone furoate-vilanterol Inhale 1 puff into the lungs daily.   buPROPion 150 MG 12 hr tablet Commonly known as:  WELLBUTRIN SR Take 150 mg by mouth daily.   clopidogrel 75 MG tablet Commonly known as:  PLAVIX Take 1 tablet (75 mg total) by mouth daily.   Desvenlafaxine Succinate ER 25 MG Tb24 Take 25 mg by mouth daily.   FERROCITE 324 (106 Fe) MG Tabs tablet  Generic drug:  Ferrous Fumarate TAKE 1 TABLET BY MOUTH QOD   FISH OIL PO Take 1 capsule by mouth daily.   isosorbide mononitrate 30 MG 24 hr tablet Commonly known as:  IMDUR Take 30 mg by mouth daily.   LYRICA 50 MG capsule Generic drug:  pregabalin Take 50 mg by mouth as needed.   nitroGLYCERIN 0.4 MG SL tablet Commonly known as:  NITROSTAT Place 1 tablet (0.4 mg total) under the tongue every 5 (five) minutes x 3 doses as needed for chest pain.   OXYGEN Inhale 2 L into the lungs continuous.   pantoprazole 40 MG tablet Commonly known as:  PROTONIX Take 1 tablet (40 mg total) by mouth 2 (two)  times daily before a meal. What changed:  when to take this   PROAIR HFA 108 (90 Base) MCG/ACT inhaler Generic drug:  albuterol Inhale 2 puffs into the lungs every 6 (six) hours as needed for wheezing or shortness of breath.   TAGRISSO 80 MG tablet Generic drug:  osimertinib mesylate TAKE 1 TABLET (80 MG TOTAL) BY MOUTH DAILY.   vitamin B-12 500 MCG tablet Commonly known as:  CYANOCOBALAMIN Take 500 mcg by mouth daily. complex      Follow-up Information    [name], MD. Schedule an appointment as soon as possible for a visit in 1 week(s).   Specialty:  Internal Medicine Contact information: [place] [address] [phone]        Jettie Booze, MD. Schedule an appointment as soon as possible for a visit in 2 week(s).   Specialties:  Cardiology, Radiology, Interventional Cardiology Contact information: [address] [address] [phone]        Brunetta Genera, MD. Schedule an appointment as soon as possible for a visit in 2 week(s).   Specialties:  Hematology, Oncology Contact information: [place] [address] [phone]          No Known Allergies  Consultations:   cardiology     Other Procedures/Studies: Dg Chest 1 View  Result Date: 10/07/2017 CLINICAL DATA:  Status post left-sided thoracentesis. EXAM: CHEST  1 VIEW COMPARISON:  Radiographs of Oct 05, 2017. FINDINGS: Stable cardiomegaly. Sternotomy wires are noted. No pneumothorax is noted. Left pleural effusion is significantly smaller status post thoracentesis. Mild bibasilar subsegmental atelectasis and residual fusions are noted. Bony thorax is unremarkable. IMPRESSION: Left pleural effusion is significantly smaller status post left-sided thoracentesis. No pneumothorax is noted. Electronically Signed   By: Marijo Conception, M.D.   On: 10/07/2017 16:13   Dg Chest 1 View  Result Date: 09/22/2017 CLINICAL  DATA:  Status post left-sided thoracentesis EXAM: CHEST  1 VIEW COMPARISON:  Chest x-ray of September 15, 2017 FINDINGS: The volume of pleural fluid on the left has markedly decreased. Patchy linear density at the left lung base is present and there is obscuration of the left hemidiaphragm. There is a trace of pleural fluid blunting the right lateral costophrenic angle. The right lung is well-expanded and clear. There is a large hiatal hernia. The heart and pulmonary vascularity are normal. The patient has undergone previous median sternotomy and internal mammary artery dissection. IMPRESSION: Interval decrease in the volume of pleural fluid on the left without evidence of postprocedure pneumothorax. Persistent left basilar atelectasis or pneumonia. New trace right pleural effusion. Electronically Signed   By: David  Martinique M.D.   On: 09/22/2017 11:25   Dg Chest 2 View  Result Date: 10/05/2017 CLINICAL DATA:  SOB and wheezing this AM; pt reports he was having pain but said he wasn't sure he could tell me where his pain was; former smoker EXAM: CHEST - 2 VIEW COMPARISON:  569 FINDINGS: Stable enlarged cardiac silhouette. Large hiatal hernia posterior to the heart. Increasing LEFT pleural effusion which extends along the LEFT lateral chest wall superiorly. Mild central venous congestion is increased. IMPRESSION: 1. Increasing LEFT effusion and central venous congestion. 2. Large hiatal hernia. Electronically Signed   By: Suzy Bouchard M.D.   On: 10/05/2017 10:05   Dg Chest 2 View  Result Date: 09/15/2017 CLINICAL DATA:  Increased shortness of breath. Malignant pleural effusion. Primary adenocarcinoma of the upper lobe of the left lung. EXAM: CHEST - 2 VIEW COMPARISON:  Chest x-rays dated 09/03/2017, 08/08/2017 and 06/20/2017 and chest CT dated 08/01/2017 FINDINGS: There is a recurrent large left pleural effusion with compressive atelectasis of the left lung, similar to the appearance on the study of 06/20/2017.  Right lung is clear.  Pulmonary vascularity is normal. Chronic large hiatal hernia no acute bone abnormality. Multiple surgical clips in the chest.  Previous median sternotomy. IMPRESSION: 1. Recurrent large left pleural effusion with compressive atelectasis of the left lung. 2. Chronic large hiatal hernia. Electronically Signed   By: Lorriane Shire M.D.   On: 09/15/2017 11:59   Ir Thoracentesis Asp Pleural Space W/img Guide  Result Date: 10/07/2017 INDICATION: Patient with recurrent left pleural effusion. Request is made for therapeutic thoracentesis. EXAM: ULTRASOUND GUIDED THERAPEUTIC THORACENTESIS MEDICATIONS: 10 mL 2% lidocaine COMPLICATIONS: None immediate. PROCEDURE: An ultrasound guided thoracentesis was thoroughly discussed with the patient and questions answered. The benefits, risks, alternatives and complications were also discussed. The patient understands and wishes to proceed with the procedure. Written consent was obtained. Ultrasound was performed to localize and mark an adequate pocket of fluid in the left chest. The area was then prepped and draped in the normal sterile fashion. 2% lidocaine was used for local anesthesia. Under ultrasound guidance a 6 Fr Safe-T-Centesis catheter was introduced. Thoracentesis was performed. The catheter was removed and a dressing applied. FINDINGS: A total of approximately 1.6 liters of yellow fluid was removed. Samples were sent to the laboratory as requested by the clinical team. IMPRESSION: Successful ultrasound guided therapeutic left thoracentesis yielding 1.6 L of pleural fluid. Read by: Brynda Greathouse PA-C Follow-up chest radiograph shows no pneumothorax. Electronically Signed   By: Lucrezia Europe M.D.   On: 10/07/2017 16:07   US Thoracentesis Asp Pleural Space W/img Guide  Result Date: 09/22/2017 INDICATION: Patient with history of metastatic adenocarcinoma of the lung with recurrent malignant left pleural effusion, dyspnea. Request made for diagnostic  and therapeutic left thoracentesis. EXAM: ULTRASOUND GUIDED DIAGNOSTIC AND THERAPEUTIC LEFT THORACENTESIS MEDICATIONS: None COMPLICATIONS: None immediate. PROCEDURE: An ultrasound guided thoracentesis was thoroughly discussed with the patient and questions answered. The benefits, risks, alternatives and complications were also discussed. The patient understands and wishes to proceed with the procedure. Written consent was obtained. Ultrasound was performed to localize and mark an adequate pocket of fluid in the left chest. The area was then prepped and draped in the normal sterile fashion. 1% Lidocaine was used for local anesthesia. Under ultrasound guidance a 6 Fr Safe-T-Centesis catheter was introduced. Thoracentesis was performed. The catheter was removed and a dressing applied. FINDINGS: A total of approximately 1.7 liters of slightly hazy, yellow fluid was removed. Samples were sent to the laboratory as requested by the clinical team. Due to  patient chest discomfort only the above amount of fluid was removed today. IMPRESSION: Successful ultrasound guided diagnostic and therapeutic left thoracentesis yielding 1.7 liters of pleural fluid. Follow-up chest x-ray revealed no pneumothorax. Electronically Signed   By: Lucrezia Europe M.D.   On: 09/22/2017 11:30      TODAY-DAY OF DISCHARGE:  Subjective:   Scott [name] today has no headache,no chest abdominal pain,no new weakness tingling or numbness, feels much better wants to go home today.   Objective:   Blood pressure 124/77, pulse 77, temperature 98 F (36.7 C), temperature source Oral, resp. rate (!) 24, height 5\' 6"  (1.676 m), weight 65.5 kg (144 lb 8 oz), SpO2 96 %.  Intake/Output Summary (Last 24 hours) at 10/08/2017 0752 Last data filed at 10/08/2017 6789 Gross per 24 hour  Intake 620.25 ml  Output 200 ml  Net 420.25 ml   Filed Weights   10/05/17 0911 10/06/17 0400 10/08/17 0219  Weight: 68.9 kg (152 lb) 69.6 kg (153 lb 7 oz) 65.5 kg (144  lb 8 oz)    Exam: Awake Alert, Oriented *3, No new F.N deficits, Normal affect [place].AT,PERRAL Supple Neck,No JVD, No cervical lymphadenopathy appriciated.  Symmetrical Chest wall movement, Good air movement bilaterally, CTAB RRR,No Gallops,Rubs or new Murmurs, No Parasternal Heave +ve B.Sounds, Abd Soft, Non tender, No organomegaly appriciated, No rebound -guarding or rigidity. No Cyanosis, Clubbing or edema, No new Rash or bruise   PERTINENT RADIOLOGIC STUDIES: Dg Chest 1 View  Result Date: 10/07/2017 CLINICAL DATA:  Status post left-sided thoracentesis. EXAM: CHEST  1 VIEW COMPARISON:  Radiographs of Oct 05, 2017. FINDINGS: Stable cardiomegaly. Sternotomy wires are noted. No pneumothorax is noted. Left pleural effusion is significantly smaller status post thoracentesis. Mild bibasilar subsegmental atelectasis and residual fusions are noted. Bony thorax is unremarkable. IMPRESSION: Left pleural effusion is significantly smaller status post left-sided thoracentesis. No pneumothorax is noted. Electronically Signed   By: Marijo Conception, M.D.   On: 10/07/2017 16:13   Dg Chest 1 View  Result Date: 09/22/2017 CLINICAL DATA:  Status post left-sided thoracentesis EXAM: CHEST  1 VIEW COMPARISON:  Chest x-ray of September 15, 2017 FINDINGS: The volume of pleural fluid on the left has markedly decreased. Patchy linear density at the left lung base is present and there is obscuration of the left hemidiaphragm. There is a trace of pleural fluid blunting the right lateral costophrenic angle. The right lung is well-expanded and clear. There is a large hiatal hernia. The heart and pulmonary vascularity are normal. The patient has undergone previous median sternotomy and internal mammary artery dissection. IMPRESSION: Interval decrease in the volume of pleural fluid on the left without evidence of postprocedure pneumothorax. Persistent left basilar atelectasis or pneumonia. New trace right pleural effusion.  Electronically Signed   By: David  Martinique M.D.   On: 09/22/2017 11:25   Dg Chest 2 View  Result Date: 10/05/2017 CLINICAL DATA:  SOB and wheezing this AM; pt reports he was having pain but said he wasn't sure he could tell me where his pain was; former smoker EXAM: CHEST - 2 VIEW COMPARISON:  569 FINDINGS: Stable enlarged cardiac silhouette. Large hiatal hernia posterior to the heart. Increasing LEFT pleural effusion which extends along the LEFT lateral chest wall superiorly. Mild central venous congestion is increased. IMPRESSION: 1. Increasing LEFT effusion and central venous congestion. 2. Large hiatal hernia. Electronically Signed   By: Suzy Bouchard M.D.   On: 10/05/2017 10:05   Dg Chest 2 View  Result  Date: 09/15/2017 CLINICAL DATA:  Increased shortness of breath. Malignant pleural effusion. Primary adenocarcinoma of the upper lobe of the left lung. EXAM: CHEST - 2 VIEW COMPARISON:  Chest x-rays dated 09/03/2017, 08/08/2017 and 06/20/2017 and chest CT dated 08/01/2017 FINDINGS: There is a recurrent large left pleural effusion with compressive atelectasis of the left lung, similar to the appearance on the study of 06/20/2017. Right lung is clear.  Pulmonary vascularity is normal. Chronic large hiatal hernia no acute bone abnormality. Multiple surgical clips in the chest.  Previous median sternotomy. IMPRESSION: 1. Recurrent large left pleural effusion with compressive atelectasis of the left lung. 2. Chronic large hiatal hernia. Electronically Signed   By: Lorriane Shire M.D.   On: 09/15/2017 11:59   Ir Thoracentesis Asp Pleural Space W/img Guide  Result Date: 10/07/2017 INDICATION: Patient with recurrent left pleural effusion. Request is made for therapeutic thoracentesis. EXAM: ULTRASOUND GUIDED THERAPEUTIC THORACENTESIS MEDICATIONS: 10 mL 2% lidocaine COMPLICATIONS: None immediate. PROCEDURE: An ultrasound guided thoracentesis was thoroughly discussed with the patient and questions answered.  The benefits, risks, alternatives and complications were also discussed. The patient understands and wishes to proceed with the procedure. Written consent was obtained. Ultrasound was performed to localize and mark an adequate pocket of fluid in the left chest. The area was then prepped and draped in the normal sterile fashion. 2% lidocaine was used for local anesthesia. Under ultrasound guidance a 6 Fr Safe-T-Centesis catheter was introduced. Thoracentesis was performed. The catheter was removed and a dressing applied. FINDINGS: A total of approximately 1.6 liters of yellow fluid was removed. Samples were sent to the laboratory as requested by the clinical team. IMPRESSION: Successful ultrasound guided therapeutic left thoracentesis yielding 1.6 L of pleural fluid. Read by: Brynda Greathouse PA-C Follow-up chest radiograph shows no pneumothorax. Electronically Signed   By: Lucrezia Europe M.D.   On: 10/07/2017 16:07   US Thoracentesis Asp Pleural Space W/img Guide  Result Date: 09/22/2017 INDICATION: Patient with history of metastatic adenocarcinoma of the lung with recurrent malignant left pleural effusion, dyspnea. Request made for diagnostic and therapeutic left thoracentesis. EXAM: ULTRASOUND GUIDED DIAGNOSTIC AND THERAPEUTIC LEFT THORACENTESIS MEDICATIONS: None COMPLICATIONS: None immediate. PROCEDURE: An ultrasound guided thoracentesis was thoroughly discussed with the patient and questions answered. The benefits, risks, alternatives and complications were also discussed. The patient understands and wishes to proceed with the procedure. Written consent was obtained. Ultrasound was performed to localize and mark an adequate pocket of fluid in the left chest. The area was then prepped and draped in the normal sterile fashion. 1% Lidocaine was used for local anesthesia. Under ultrasound guidance a 6 Fr Safe-T-Centesis catheter was introduced. Thoracentesis was performed. The catheter was removed and a dressing  applied. FINDINGS: A total of approximately 1.7 liters of slightly hazy, yellow fluid was removed. Samples were sent to the laboratory as requested by the clinical team. Due to patient chest discomfort only the above amount of fluid was removed today. IMPRESSION: Successful ultrasound guided diagnostic and therapeutic left thoracentesis yielding 1.7 liters of pleural fluid. Follow-up chest x-ray revealed no pneumothorax. Electronically Signed   By: Lucrezia Europe M.D.   On: 09/22/2017 11:30     PERTINENT LAB RESULTS: CBC: Recent Labs    10/07/17 0351 10/08/17 0321  WBC 7.2 7.5  HGB 12.2* 11.5*  HCT 39.4 36.9*  PLT 169 157   CMET CMP     Component Value Date/Time   NA 141 10/07/2017 0351   NA 138 07/24/2017   NA 140  05/23/2017 0948   K 4.4 10/07/2017 0351   K 5.0 05/23/2017 0948   CL 108 10/07/2017 0351   CL 111 07/23/2017   CO2 22 10/07/2017 0351   CO2 24 07/23/2017   CO2 24 05/23/2017 0948   GLUCOSE 96 10/07/2017 0351   GLUCOSE 81 05/23/2017 0948   BUN 33 (H) 10/07/2017 0351   BUN 30 (A) 07/24/2017   BUN 41.7 (H) 05/23/2017 0948   CREATININE 1.77 (H) 10/07/2017 0351   CREATININE 2.06 (H) 09/15/2017 0924   CREATININE 2.5 (H) 05/23/2017 0948   CALCIUM 9.2 10/07/2017 0351   CALCIUM 8.5 07/23/2017   CALCIUM 9.2 05/23/2017 0948   PROT 7.0 09/15/2017 0924   PROT 5.7 07/22/2017   PROT 6.9 05/23/2017 0948   ALBUMIN 3.3 (L) 09/15/2017 0924   ALBUMIN 3.6 07/22/2017   ALBUMIN 3.6 05/23/2017 0948   AST 31 09/15/2017 0924   AST 15 05/23/2017 0948   ALT 28 09/15/2017 0924   ALT 16 05/23/2017 0948   ALKPHOS 75 09/15/2017 0924   ALKPHOS 67 05/23/2017 0948   BILITOT 0.2 09/15/2017 0924   BILITOT 0.41 05/23/2017 0948   GFRNONAA 32 (L) 10/07/2017 0351   GFRNONAA 27 (L) 09/15/2017 0924   GFRNONAA 24 07/22/2017   GFRAA 37 (L) 10/07/2017 0351   GFRAA 31 (L) 09/15/2017 0924    GFR Estimated Creatinine Clearance: 24.5 mL/min (A) (by C-G formula based on SCr of 1.77 mg/dL (H)). No  results for input(s): LIPASE, AMYLASE in the last 72 hours. Recent Labs    10/05/17 1521 10/05/17 2035 10/06/17 0240  TROPONINI 10.07* 10.29* 8.99*   Invalid input(s): POCBNP No results for input(s): DDIMER in the last 72 hours. No results for input(s): HGBA1C in the last 72 hours. No results for input(s): CHOL, HDL, LDLCALC, TRIG, CHOLHDL, LDLDIRECT in the last 72 hours. No results for input(s): TSH, T4TOTAL, T3FREE, THYROIDAB in the last 72 hours.  Invalid input(s): FREET3 No results for input(s): VITAMINB12, FOLATE, FERRITIN, TIBC, IRON, RETICCTPCT in the last 72 hours. Coags: No results for input(s): INR in the last 72 hours.  Invalid input(s): PT Microbiology: No results found for this or any previous visit (from the past 240 hour(s)).  FURTHER DISCHARGE INSTRUCTIONS:  Get Medicines reviewed and adjusted: Please take all your medications with you for your next visit with your Primary MD  Laboratory/radiological data: Please request your Primary MD to go over all hospital tests and procedure/radiological results at the follow up, please ask your Primary MD to get all Hospital records sent to his/her office.  In some cases, they will be blood work, cultures and biopsy results pending at the time of your discharge. Please request that your primary care M.D. goes through all the records of your hospital data and follows up on these results.  Also Note the following: If you experience worsening of your admission symptoms, develop shortness of breath, life threatening emergency, suicidal or homicidal thoughts you must seek medical attention immediately by calling 911 or calling your MD immediately  if symptoms less severe.  You must read complete instructions/literature along with all the possible adverse reactions/side effects for all the Medicines you take and that have been prescribed to you. Take any new Medicines after you have completely understood and accpet all the possible  adverse reactions/side effects.   Do not drive when taking Pain medications or sleeping medications (Benzodaizepines)  Do not take more than prescribed Pain, Sleep and Anxiety Medications. It is not advisable to combine anxiety,sleep  and pain medications without talking with your primary care practitioner  Special Instructions: If you have smoked or chewed Tobacco  in the last 2 yrs please stop smoking, stop any regular Alcohol  and or any Recreational drug use.  Wear Seat belts while driving.  Please note: You were cared for by a hospitalist during your hospital stay. Once you are discharged, your primary care physician will handle any further medical issues. Please note that NO REFILLS for any discharge medications will be authorized once you are discharged, as it is imperative that you return to your primary care physician (or establish a relationship with a primary care physician if you do not have one) for your post hospital discharge needs so that they can reassess your need for medications and monitor your lab values.  Total Time spent coordinating discharge including counseling, education and face to face time equals 35 minutes.  SignedOren Binet 10/08/2017 7:52 AM

## 2017-10-10 ENCOUNTER — Encounter: Payer: Self-pay | Admitting: Nurse Practitioner

## 2017-10-10 ENCOUNTER — Non-Acute Institutional Stay (SKILLED_NURSING_FACILITY): Payer: Medicare Other | Admitting: Nurse Practitioner

## 2017-10-10 ENCOUNTER — Telehealth: Payer: Self-pay

## 2017-10-10 DIAGNOSIS — N183 Chronic kidney disease, stage 3 unspecified: Secondary | ICD-10-CM

## 2017-10-10 DIAGNOSIS — I214 Non-ST elevation (NSTEMI) myocardial infarction: Secondary | ICD-10-CM | POA: Diagnosis not present

## 2017-10-10 DIAGNOSIS — K219 Gastro-esophageal reflux disease without esophagitis: Secondary | ICD-10-CM | POA: Diagnosis not present

## 2017-10-10 DIAGNOSIS — C3412 Malignant neoplasm of upper lobe, left bronchus or lung: Secondary | ICD-10-CM | POA: Diagnosis not present

## 2017-10-10 DIAGNOSIS — I10 Essential (primary) hypertension: Secondary | ICD-10-CM

## 2017-10-10 DIAGNOSIS — I5032 Chronic diastolic (congestive) heart failure: Secondary | ICD-10-CM

## 2017-10-10 DIAGNOSIS — D509 Iron deficiency anemia, unspecified: Secondary | ICD-10-CM

## 2017-10-10 DIAGNOSIS — F325 Major depressive disorder, single episode, in full remission: Secondary | ICD-10-CM

## 2017-10-10 DIAGNOSIS — I25700 Atherosclerosis of coronary artery bypass graft(s), unspecified, with unstable angina pectoris: Secondary | ICD-10-CM | POA: Diagnosis not present

## 2017-10-10 NOTE — Assessment & Plan Note (Signed)
Continue Vit B12, Fe, last Hgb 11.5 10/08/17

## 2017-10-10 NOTE — Assessment & Plan Note (Signed)
Observe, creat 1.77 10/07/17

## 2017-10-10 NOTE — Assessment & Plan Note (Signed)
Hx of metastatic left lung adenocarcinoma, recurrent malignant left sided pleural effusion, last thoracocentesis 10/07/17, taking Breo Ellipta daily  under Oncology care.

## 2017-10-10 NOTE — Assessment & Plan Note (Signed)
Compensated clinically, monitor weight, f/u cardiology.

## 2017-10-10 NOTE — Assessment & Plan Note (Signed)
Controlled.  

## 2017-10-10 NOTE — Assessment & Plan Note (Signed)
hospitalized 10/05/17-10/08/17 for non STEMI, cardiology recommended medial conservative management. On ASA and Plavix x one month, then will be maintained on ASA 81mg  qd Continued Imdur 30mg  qd and Atorvastatin 40mg  qd. Discharge summary mentioned added BCB which is not on discharge medication list, 10/05/17 Cardiology consultation, no BCB started.  F/u Cardiology in 7 days.

## 2017-10-10 NOTE — Assessment & Plan Note (Signed)
Depression, stable, continue Pristiq and Bupropion 150mg  qd.

## 2017-10-10 NOTE — Progress Notes (Signed)
Location:  [facility] Room Number: 17 Place of Service:  SNF (31) Provider:  Madylyn Insco, ManXie  NP  [name], MD  Patient Care Team: [name], MD as PCP - General (Internal Medicine) Jettie Booze, MD as PCP - Cardiology (Cardiology) Ignatius Kloos X, NP as Nurse Practitioner (Internal Medicine)  Extended Emergency Contact Information Primary Emergency Contact: [facility] Address: [address]          [place], [place] [address] Johnnette Litter of [place] Phone: [phone] Relation: Spouse Secondary Emergency Contact: Walgren,[name] Address: [place], [place] [address] Johnnette Litter of [place] Phone: [phone] Work Phone: [phone] Mobile Phone: [phone] Relation: Daughter  Code Status:  DNR Goals of care: Advanced Directive information Advanced Directives 10/05/2017  Does Patient Have a Medical Advance Directive? Yes  Type of Advance Directive Out of facility DNR (pink MOST or yellow form);Healthcare Power of Attorney  Does patient want to make changes to medical advance directive? No - Patient declined  Copy of [place] in Chart? Yes  Would patient like information on creating a medical advance directive? No - Patient declined  Pre-existing out of facility DNR order (yellow form or pink MOST form) Yellow form placed in chart (order not valid for inpatient use)     Chief Complaint  Patient presents with  . Hospitalization Follow-up    Heart attack    HPI:  Pt is a 84 y.o. male seen today for a hospital f/u: hospitalized 10/05/17-10/08/17 for non STEMI, cardiology recommended medial conservative management. On ASA and Plavix x one month, then will be maintained on ASA 81mg  qd Continued Imdur 30mg  qd and Atorvastatin 40mg  qd. Discharge summary mentioned added BCB which is not on discharge medication list, 10/05/17 Cardiology consultation, no BCB started. Hx  of toprol stopped due to bradycardia 10/15/2014.     Hx of metastatic left lung adenocarcinoma, recurrent malignant left sided pleural effusion, last thoracocentesis 10/07/17, taking Breo Ellipta daily  under Oncology care. Hx of CAD s/p CABG. CKD stage III. CHF euvolemic. GERD stable, on Pantoprazole 40mg  bid. Depression, stable on Pristiq and Bupropion 150mg  qd. HTN, blood pressure is controlled. Anemia, on Vit B12, Fe, last Hgb 11.5 10/08/17 Past Medical History:  Diagnosis Date  . Anxiety   . Bradycardia    a. BB d/c'd in 2016.  [name] CAD (coronary artery disease)    a. 1983 s/p CABG [facility], [place]);  b. 2007 NSTEMI->med Rx for distal LCX dzs; c. 09/2014 NSTEMI/Cath: LIMA->LAD & diag ok, native RCA dominant and patent.   VG->RCA 100, VG->LCX 100 -->Med Rx.  . CKD (chronic kidney disease), stage III ([place])   . Claudication ([place])    BLE  . Depression   . Diastolic dysfunction    a. 10/2014 Echo: F 60-65%, Gr1 DD, mild AS, mild AI, mild to mod MR, PASP 65mmHg.  [name] Dyspnea 2010   syndrome - extensive  . Erectile dysfunction   . GERD (gastroesophageal reflux disease)   . H/O hiatal hernia   . History of blood transfusion 2014  . Hx of adenomatous colonic polyps 2007  . Hyperlipidemia   . Hypertensive heart disease   . Inguinal hernia   . Iron deficiency anemia   . Lung cancer ([place])   . Shingles 1990's  . Sigmoid diverticulosis   . Upper GI bleeding 2014  Gastritis and possible duodenal ulcer seen on EGD, felt secondary to NSAIDs  . Urethral stricture 11/2008  . Vasovagal syncope 05/2006  . Weight loss    Past Surgical History:  Procedure Laterality Date  . CARDIAC CATHETERIZATION  2007   Left main 100%, RCA 70% ostial, all grafts were patent, EF 40-45 percent, distal circumflex subtotal after graft insertion, medical therapy   . CARDIAC CATHETERIZATION N/A 10/14/2014   Procedure: Left Heart Cath and Cors/Grafts Angiography;  Surgeon: Lorretta Harp, MD;  Location: [place] CV LAB;   Service: Cardiovascular;  Laterality: N/A;  . CHOLECYSTECTOMY  1980's  . CORONARY ARTERY BYPASS GRAFT  1994    LIMA-LAD, SVG-D1, SVG-OM,  SVG-RCA; performed in [place], [place]  . ESOPHAGOGASTRODUODENOSCOPY N/A 03/25/2013   Procedure: ESOPHAGOGASTRODUODENOSCOPY (EGD);  Surgeon: Missy Sabins, MD;  gastritis, cannot rule out duodenal ulcer   . INGUINAL HERNIA REPAIR Left   . IR THORACENTESIS ASP PLEURAL SPACE W/IMG GUIDE  10/07/2017  . TRANSURETHRAL RESECTION OF PROSTATE      No Known Allergies  Outpatient Encounter Medications as of 10/10/2017  Medication Sig  . acetaminophen (TYLENOL) 500 MG tablet Take 500 mg by mouth every 6 (six) hours as needed for mild pain or moderate pain.  [name] albuterol (PROAIR HFA) 108 (90 Base) MCG/ACT inhaler Inhale 2 puffs into the lungs every 6 (six) hours as needed for wheezing or shortness of breath.  [name] aspirin EC 81 MG EC tablet Take 1 tablet (81 mg total) by mouth daily.  [name] atorvastatin (LIPITOR) 40 MG tablet Take 1 tablet (40 mg total) by mouth daily at 6 PM.  . BREO ELLIPTA 100-25 MCG/INH AEPB Inhale 1 puff into the lungs daily.  [name] buPROPion (WELLBUTRIN SR) 150 MG 12 hr tablet Take 150 mg by mouth daily.  . clopidogrel (PLAVIX) 75 MG tablet Take 1 tablet (75 mg total) by mouth daily.  [name] Desvenlafaxine Succinate ER 25 MG TB24 Take 25 mg by mouth daily.  [name] FERROCITE 324 MG TABS tablet TAKE 1 TABLET BY MOUTH QOD  . isosorbide mononitrate (IMDUR) 30 MG 24 hr tablet Take 30 mg by mouth daily.  . nitroGLYCERIN (NITROSTAT) 0.4 MG SL tablet Place 1 tablet (0.4 mg total) under the tongue every 5 (five) minutes x 3 doses as needed for chest pain.  . Omega-3 Fatty Acids (FISH OIL PO) Take 1 capsule by mouth daily.  . OXYGEN Inhale 2 L into the lungs continuous.  . pantoprazole (PROTONIX) 40 MG tablet Take 1 tablet (40 mg total) by mouth 2 (two) times daily before a meal. (Patient taking differently: Take 40 mg by mouth daily. )  . pregabalin (LYRICA) 50 MG  capsule Take 50 mg by mouth as needed.   [name] TAGRISSO 80 MG tablet TAKE 1 TABLET (80 MG TOTAL) BY MOUTH DAILY.  . vitamin B-12 (CYANOCOBALAMIN) 500 MCG tablet Take 500 mcg by mouth daily. complex   No facility-administered encounter medications on file as of 10/10/2017.     Review of Systems  Constitutional: Positive for fatigue. Negative for activity change, appetite change, chills, diaphoresis and fever.  HENT: Positive for hearing loss. Negative for congestion and voice change.   Respiratory: Positive for cough and shortness of breath. Negative for chest tightness and wheezing.   Cardiovascular: Positive for leg swelling. Negative for chest pain and palpitations.  Gastrointestinal: Negative for abdominal distention, abdominal pain, constipation, diarrhea, nausea and vomiting.  Genitourinary: Negative for difficulty urinating, dysuria and urgency.  Musculoskeletal: Positive for arthralgias  and gait problem.  Skin: Negative for color change.  Neurological: Negative for dizziness, speech difficulty, weakness and headaches.       Memory lapses.   Psychiatric/Behavioral: Negative for agitation, behavioral problems, hallucinations and sleep disturbance. The patient is not nervous/anxious.     Immunization History  Administered Date(s) Administered  . Influenza-Unspecified 02/17/2013, 05/20/2017  . Pneumococcal-Unspecified 05/20/2017  . Tdap 07/11/2012   Pertinent  Health Maintenance Due  Topic Date Due  . INFLUENZA VACCINE  12/18/2017  . PNA vac Low Risk Adult (2 of 2 - PCV13) 05/20/2018   Fall Risk  09/25/2017 04/07/2017  Falls in the past year? Yes No  Number falls in past yr: 2 or more -  Injury with Fall? No -   Functional Status Survey:    Vitals:   10/10/17 1039  BP: 120/80  Pulse: 76  Resp: 18  Temp: (!) 96.8 F (36 C)  SpO2: 97%  Weight: 143 lb 6.4 oz (65 kg)  Height: 5\' 6"  (1.676 m)   Body mass index is 23.15 kg/m. Physical Exam  Constitutional: He appears  well-developed and well-nourished.  HENT:  Head: Normocephalic and atraumatic.  Eyes: Pupils are equal, round, and reactive to light. EOM are normal.  Neck: Normal range of motion. Neck supple. No JVD present. No thyromegaly present.  Cardiovascular: Normal rate and regular rhythm.  Murmur heard. Pulmonary/Chest: He has no wheezes. He has no rales.  Left sided lung decreased air entry, DOE, O2 2lpm via [place]   Abdominal: Soft. Bowel sounds are normal. He exhibits no distension. There is no tenderness. There is no guarding.  Musculoskeletal: He exhibits edema.  Trace edema BLE. SBA transfer and ambulation  Neurological: He is alert. No cranial nerve deficit. He exhibits normal muscle tone. Coordination normal.  Oriented to person and place.   Skin: Skin is warm and dry.  Psychiatric: He has a normal mood and affect. His behavior is normal.    Labs reviewed: Recent Labs    05/23/17 0947 05/23/17 0948 06/20/17 1054  08/01/17 1106  10/05/17 0920 10/06/17 0240 10/07/17 0351  NA  --  140 140   < > 142   < > 139 140 141  K  --  5.0 5.4*   < > 4.5   < > 5.5* 4.3 4.4  CL  --   --  107   < > 109   < > 109 108 108  CO2  --  24 26   < > 25   < > 22 22 22   GLUCOSE  --  81 104  --  65*   < > 106* 90 96  BUN  --  41.7* 35*   < > 26   < > 29* 28* 33*  CREATININE  --  2.5* 2.30*   < > 1.69*   < > 1.90* 1.85* 1.77*  CALCIUM  --  9.2 9.3   < > 9.3   < > 8.5* 8.8* 9.2  MG  --  3.2* 3.0*  --  2.4  --   --   --   --   PHOS 3.7  --  3.4  --   --   --   --   --   --    < > = values in this interval not displayed.   Recent Labs    08/14/17 1434 09/03/17 1423 09/15/17 0924  AST 15 17 31   ALT 17 17 28   ALKPHOS 55 57 75  BILITOT 0.4 0.3 0.2  PROT 6.4 6.3* 7.0  ALBUMIN 3.2* 3.2* 3.3*   Recent Labs    08/14/17 1434 09/03/17 1423 09/15/17 0924  10/06/17 0240 10/07/17 0351 10/08/17 0321  WBC 9.2 7.7 5.8   < > 7.0 7.2 7.5  NEUTROABS 7.6* 5.7 4.0  --   --   --   --   HGB 12.2* 12.8* 12.1*    < > 11.8* 12.2* 11.5*  HCT 38.3* 41.0 38.5   < > 38.1* 39.4 36.9*  MCV 87.7 90.3 89.5   < > 89.0 88.5 88.3  PLT 176 126* 153   < > 158 169 157   < > = values in this interval not displayed.   Lab Results  Component Value Date   TSH 2.530 08/14/2017   No results found for: HGBA1C Lab Results  Component Value Date   CHOL 175 10/14/2014   HDL 39 (L) 10/14/2014   LDLCALC 114 (H) 10/14/2014   TRIG 108 10/14/2014   CHOLHDL 4.5 10/14/2014    Significant Diagnostic Results in last 30 days:  Dg Chest 2 View  Result Date: 10/05/2017 CLINICAL DATA:  SOB and wheezing this AM; pt reports he was having pain but said he wasn't sure he could tell me where his pain was; former smoker EXAM: CHEST - 2 VIEW COMPARISON:  569 FINDINGS: Stable enlarged cardiac silhouette. Large hiatal hernia posterior to the heart. Increasing LEFT pleural effusion which extends along the LEFT lateral chest wall superiorly. Mild central venous congestion is increased. IMPRESSION: 1. Increasing LEFT effusion and central venous congestion. 2. Large hiatal hernia. Electronically Signed   By: Suzy Bouchard M.D.   On: 10/05/2017 10:05    Assessment/Plan NSTEMI (non-ST elevated myocardial infarction) [facility]) hospitalized 10/05/17-10/08/17 for non STEMI, cardiology recommended medial conservative management. On ASA and Plavix x one month, then will be maintained on ASA 81mg  qd Continued Imdur 30mg  qd and Atorvastatin 40mg  qd. Discharge summary mentioned added BCB which is not on discharge medication list, 10/05/17 Cardiology consultation, no BCB started.  F/u Cardiology in 7 days.   Primary adenocarcinoma of upper lobe of left lung (HCC) Hx of metastatic left lung adenocarcinoma, recurrent malignant left sided pleural effusion, last thoracocentesis 10/07/17, taking Breo Ellipta daily  under Oncology care.  CAD (coronary artery disease) of artery bypass graft, ALL VGs occluded, patent LIMA -->LAD, patent dominant RCA Continue  Plavix, ASA, Atorvastatin, prn NTG  CKD (chronic kidney disease), stage III (HCC) Observe, creat 1.77 1/61/09  Chronic diastolic CHF (congestive heart failure) ([place]) Compensated clinically, monitor weight, f/u cardiology.   GERD (gastroesophageal reflux disease) Stable, continue Pantoprazole 40mg  bid.   Depression Depression, stable, continue Pristiq and Bupropion 150mg  qd.  Essential hypertension Controlled.   Anemia Continue Vit B12, Fe, last Hgb 11.5 10/08/17      Family/ staff Communication: plan of care reviewed with the patient and charge nurse.   Labs/tests ordered:  F/u CBC BMP  Time spend 25 minutes.

## 2017-10-10 NOTE — Telephone Encounter (Signed)
Possible re-admission to facility. This is a patient you were seeing at [facility] . [facility] F/U is needed if patient was re-admitted to facility in SNF rehab upon discharge. Hospital discharge from [facility] on 10/08/2017

## 2017-10-10 NOTE — Assessment & Plan Note (Signed)
Continue Plavix, ASA, Atorvastatin, prn NTG

## 2017-10-10 NOTE — Assessment & Plan Note (Signed)
Stable, continue Pantoprazole 40mg  bid.

## 2017-10-14 ENCOUNTER — Ambulatory Visit (HOSPITAL_COMMUNITY)
Admission: RE | Admit: 2017-10-14 | Discharge: 2017-10-14 | Disposition: A | Discharge status: Home / Self Care | Payer: Medicare Other | Source: Ambulatory Visit | Attending: Hematology | Admitting: Hematology

## 2017-10-14 ENCOUNTER — Non-Acute Institutional Stay (SKILLED_NURSING_FACILITY): Payer: Medicare Other | Admitting: Internal Medicine

## 2017-10-14 ENCOUNTER — Encounter: Payer: Self-pay | Admitting: Internal Medicine

## 2017-10-14 DIAGNOSIS — Z5112 Encounter for antineoplastic immunotherapy: Secondary | ICD-10-CM | POA: Diagnosis not present

## 2017-10-14 DIAGNOSIS — I214 Non-ST elevation (NSTEMI) myocardial infarction: Secondary | ICD-10-CM

## 2017-10-14 DIAGNOSIS — R4189 Other symptoms and signs involving cognitive functions and awareness: Secondary | ICD-10-CM

## 2017-10-14 DIAGNOSIS — I700 Atherosclerosis of aorta: Secondary | ICD-10-CM | POA: Diagnosis not present

## 2017-10-14 DIAGNOSIS — R918 Other nonspecific abnormal finding of lung field: Secondary | ICD-10-CM | POA: Insufficient documentation

## 2017-10-14 DIAGNOSIS — R5381 Other malaise: Secondary | ICD-10-CM | POA: Diagnosis not present

## 2017-10-14 DIAGNOSIS — I10 Essential (primary) hypertension: Secondary | ICD-10-CM

## 2017-10-14 DIAGNOSIS — K449 Diaphragmatic hernia without obstruction or gangrene: Secondary | ICD-10-CM | POA: Diagnosis not present

## 2017-10-14 DIAGNOSIS — C3412 Malignant neoplasm of upper lobe, left bronchus or lung: Secondary | ICD-10-CM | POA: Insufficient documentation

## 2017-10-14 DIAGNOSIS — I5032 Chronic diastolic (congestive) heart failure: Secondary | ICD-10-CM | POA: Diagnosis not present

## 2017-10-14 DIAGNOSIS — C3490 Malignant neoplasm of unspecified part of unspecified bronchus or lung: Secondary | ICD-10-CM | POA: Diagnosis not present

## 2017-10-14 DIAGNOSIS — J910 Malignant pleural effusion: Secondary | ICD-10-CM | POA: Insufficient documentation

## 2017-10-14 DIAGNOSIS — K219 Gastro-esophageal reflux disease without esophagitis: Secondary | ICD-10-CM

## 2017-10-14 DIAGNOSIS — N183 Chronic kidney disease, stage 3 unspecified: Secondary | ICD-10-CM

## 2017-10-14 DIAGNOSIS — J9611 Chronic respiratory failure with hypoxia: Secondary | ICD-10-CM | POA: Diagnosis not present

## 2017-10-14 DIAGNOSIS — F325 Major depressive disorder, single episode, in full remission: Secondary | ICD-10-CM

## 2017-10-14 DIAGNOSIS — J90 Pleural effusion, not elsewhere classified: Secondary | ICD-10-CM

## 2017-10-14 LAB — BASIC METABOLIC PANEL
BUN: 33 — AB (ref 4–21)
Creatinine: 1.8 — AB (ref 0.6–1.3)
Glucose: 135
Potassium: 4.3 (ref 3.4–5.3)
Sodium: 140 (ref 137–147)

## 2017-10-14 LAB — CBC AND DIFFERENTIAL
HCT: 37 — AB (ref 41–53)
Hemoglobin: 12 — AB (ref 13.5–17.5)
PLATELETS: 166 (ref 150–399)
WBC: 6.7

## 2017-10-14 LAB — CMP 10231
CHLORIDE: 108
Calcium: 8.8
Carbon Dioxide, Total: 23
GFR CALC NON AF AMER: 33

## 2017-10-14 NOTE — Progress Notes (Signed)
Provider:  [name] MD  Location:  [facility] Room Number: 17 Place of Service:  SNF (31)  PCP: [name], MD Patient Care Team: [name], MD as PCP - General (Internal Medicine) Jettie Booze, MD as PCP - Cardiology (Cardiology) Mast, [name], NP as Nurse Practitioner (Internal Medicine)  Extended Emergency Contact Information Primary Emergency Contact: [facility] Address: [address]          [place], [place] [address] Johnnette Litter of [place] Phone: [phone] Relation: Spouse Secondary Emergency Contact: Sager,[name] Address: [place],  [address] Johnnette Litter of [place] Phone: [phone] Work Phone: [phone] Mobile Phone: [phone] Relation: Daughter  Code Status: DNR  Goals of Care: Advanced Directive information Advanced Directives 10/14/2017  Does Patient Have a Medical Advance Directive? Yes  Type of Paramedic of [place];Out of facility DNR (pink MOST or yellow form)  Does patient want to make changes to medical advance directive? No - Patient declined  Copy of [place] in Chart? Yes  Would patient like information on creating a medical advance directive? -  Pre-existing out of facility DNR order (yellow form or pink MOST form) Yellow form placed in chart (order not valid for inpatient use)      Chief Complaint  Patient presents with  . New Admit To SNF    New Admission Visit     HPI: Patient is a 84 y.o. male seen today for admission visit from hospital. He was in the hospital from 10/05/17-10/08/17 with NSTEMI. He was seen by cardiology and medical management was recommended. He is now on aspirin and plavix with statin and imdur. He underwent thoracocentesis on 10/07/17. He was seen by palliative care team and comfort measures with hospice service has been recommended. He was residing in ALF  prior to hospitalization. He has medical history of CAD s/p CABG, metastatic left lung adenocarcinoma on immunotherapy, recurrent left sided pleural effusion, CKD stage 3 among others. He is seen in his room today.   Past Medical History:  Diagnosis Date  . Anxiety   . Bradycardia    a. BB d/c'd in 2016.  [name] CAD (coronary artery disease)    a. 1983 s/p CABG [facility] [place]);  b. 2007 NSTEMI->med Rx for distal LCX dzs; c. 09/2014 NSTEMI/Cath: LIMA->LAD & diag ok, native RCA dominant and patent.   VG->RCA 100, VG->LCX 100 -->Med Rx.  . CKD (chronic kidney disease), stage III ([place])   . Claudication ([place])    BLE  . Depression   . Diastolic dysfunction    a. 10/2014 Echo: F 60-65%, Gr1 DD, mild AS, mild AI, mild to mod MR, PASP 74mHg.  .Marland KitchenDyspnea 2010   syndrome - extensive  . Erectile dysfunction   . GERD (gastroesophageal reflux disease)   . H/O hiatal hernia   . History of blood transfusion 2014  . Hx of adenomatous colonic polyps 2007  . Hyperlipidemia   . Hypertensive heart disease   . Inguinal hernia   . Iron deficiency anemia   . Lung cancer ([place]   . Shingles 1990's  . Sigmoid diverticulosis   . Upper GI bleeding 2014   Gastritis and possible duodenal ulcer seen on EGD, felt secondary to NSAIDs  . Urethral stricture 11/2008  . Vasovagal syncope 05/2006  . Weight loss  Past Surgical History:  Procedure Laterality Date  . CARDIAC CATHETERIZATION  2007   Left main 100%, RCA 70% ostial, all grafts were patent, EF 40-45 percent, distal circumflex subtotal after graft insertion, medical therapy   . CARDIAC CATHETERIZATION N/A 10/14/2014   Procedure: Left Heart Cath and Cors/Grafts Angiography;  Surgeon: Lorretta Harp, MD;  Location: [facility];  Service: Cardiovascular;  Laterality: N/A;  . CHOLECYSTECTOMY  1980's  . CORONARY ARTERY BYPASS GRAFT  1994    LIMA-LAD, SVG-D1, SVG-OM,  SVG-RCA; performed in [place], [place]  . ESOPHAGOGASTRODUODENOSCOPY N/A 03/25/2013     Procedure: ESOPHAGOGASTRODUODENOSCOPY (EGD);  Surgeon: Missy Sabins, MD;  gastritis, cannot rule out duodenal ulcer   . INGUINAL HERNIA REPAIR Left   . IR THORACENTESIS ASP PLEURAL SPACE W/IMG GUIDE  10/07/2017  . TRANSURETHRAL RESECTION OF PROSTATE      reports that he has never smoked. He has never used smokeless tobacco. He reports that he drank about 4.2 oz of alcohol per week. He reports that he does not use drugs. Social History   Socioeconomic History  . Marital status: Married    Spouse name: Not on file  . Number of children: Not on file  . Years of education: Not on file  . Highest education level: Not on file  Occupational History  . Occupation: Retired  Scientific laboratory technician  . Financial resource strain: Not hard at all  . Food insecurity:    Worry: Never true    Inability: Never true  . Transportation needs:    Medical: No    Non-medical: No  Tobacco Use  . Smoking status: Never Smoker  . Smokeless tobacco: Never Used  Substance and Sexual Activity  . Alcohol use: Not Currently    Alcohol/week: 4.2 oz    Types: 7 Glasses of wine per week    Comment: wine - daily - currently not drinking  . Drug use: No  . Sexual activity: Never    Birth control/protection: Abstinence  Lifestyle  . Physical activity:    Days per week: 0 days    Minutes per session: 0 min  . Stress: Not at all  Relationships  . Social connections:    Talks on phone: More than three times a week    Gets together: More than three times a week    Attends religious service: Never    Active member of club or organization: No    Attends meetings of clubs or organizations: Never    Relationship status: Married  . Intimate partner violence:    Fear of current or ex partner: No    Emotionally abused: No    Physically abused: No    Forced sexual activity: No  Other Topics Concern  . Not on file  Social History Narrative   He lives in [facility], independent living, with his wife.    Functional  Status Survey:    Family History  Problem Relation Age of Onset  . Hypertension Mother   . Heart disease Mother   . Heart disease Father   . Heart failure Brother   . Heart failure Sister   . Heart failure Brother   . Heart attack Neg Hx   . Stroke Neg Hx     Health Maintenance  Topic Date Due  . INFLUENZA VACCINE  12/18/2017  . PNA vac Low Risk Adult (2 of 2 - PCV13) 05/20/2018  . TETANUS/TDAP  07/11/2022    No Known Allergies  Outpatient Encounter  Medications as of 10/14/2017  Medication Sig  . acetaminophen (TYLENOL) 500 MG tablet Take 500 mg by mouth every 6 (six) hours as needed for mild pain or moderate pain.  [name] albuterol (PROAIR HFA) 108 (90 Base) MCG/ACT inhaler Inhale 2 puffs into the lungs every 6 (six) hours as needed for wheezing or shortness of breath.  [name] aspirin EC 81 MG EC tablet Take 1 tablet (81 mg total) by mouth daily.  [name] atorvastatin (LIPITOR) 40 MG tablet Take 1 tablet (40 mg total) by mouth daily at 6 PM.  . BREO ELLIPTA 100-25 MCG/INH AEPB Inhale 1 puff into the lungs daily.  [name] buPROPion (WELLBUTRIN SR) 150 MG 12 hr tablet Take 150 mg by mouth daily.  . clopidogrel (PLAVIX) 75 MG tablet Take 1 tablet (75 mg total) by mouth daily.  [name] Desvenlafaxine Succinate ER 25 MG TB24 Take 25 mg by mouth daily.  [name] FERROCITE 324 MG TABS tablet TAKE 1 TABLET BY MOUTH QOD  . isosorbide mononitrate (IMDUR) 30 MG 24 hr tablet Take 30 mg by mouth daily.  . nitroGLYCERIN (NITROSTAT) 0.4 MG SL tablet Place 1 tablet (0.4 mg total) under the tongue every 5 (five) minutes x 3 doses as needed for chest pain.  . Omega-3 Fatty Acids (FISH OIL PO) Take 1 capsule by mouth daily.  . OXYGEN Inhale 2 L into the lungs continuous.  . pantoprazole (PROTONIX) 40 MG tablet Take 1 tablet (40 mg total) by mouth 2 (two) times daily before a meal.  . pregabalin (LYRICA) 50 MG capsule Take 50 mg by mouth as needed.   [name] TAGRISSO 80 MG tablet TAKE 1 TABLET (80 MG TOTAL) BY MOUTH DAILY.  . vitamin  B-12 (CYANOCOBALAMIN) 500 MCG tablet Take 500 mcg by mouth daily. complex   No facility-administered encounter medications on file as of 10/14/2017.     Review of Systems  Constitutional: Positive for fatigue. Negative for appetite change, chills, diaphoresis and fever.  HENT: Negative for congestion, mouth sores, nosebleeds, rhinorrhea, sinus pressure, sore throat and trouble swallowing.   Respiratory: Positive for shortness of breath. Negative for cough and chest tightness.        On oxygen by nasal canula  Cardiovascular: Negative for chest pain, palpitations and leg swelling.  Gastrointestinal: Negative for abdominal pain, blood in stool, constipation, diarrhea, nausea and vomiting.       Had a bowel movement this am  Genitourinary: Negative for dysuria and frequency.  Musculoskeletal: Positive for gait problem. Negative for back pain.  Skin: Negative for rash and wound.  Neurological: Positive for weakness. Negative for dizziness, seizures, numbness and headaches.  Psychiatric/Behavioral: Positive for confusion. Negative for behavioral problems and sleep disturbance.    Vitals:   10/14/17 1443  BP: 118/63  Pulse: 78  Resp: 20  Temp: 97.6 F (36.4 C)  TempSrc: Oral  SpO2: 97%  Weight: 143 lb 6.4 oz (65 kg)  Height: 5' 6"  (1.676 m)   Body mass index is 23.15 kg/m.   Wt Readings from Last 3 Encounters:  10/14/17 143 lb 6.4 oz (65 kg)  10/10/17 143 lb 6.4 oz (65 kg)  10/08/17 144 lb 8 oz (65.5 kg)   Physical Exam  Constitutional: He appears well-developed and well-nourished. No distress.  HENT:  Head: Normocephalic and atraumatic.  Nose: Nose normal.  Mouth/Throat: Oropharynx is clear and moist. No oropharyngeal exudate.  Eyes: Pupils are equal, round, and reactive to light. Conjunctivae and EOM are normal. Right eye exhibits no discharge. Left  eye exhibits no discharge. No scleral icterus.  Neck: Normal range of motion. Neck supple.  Cardiovascular: Intact distal  pulses.  Irregular heart rate  Pulmonary/Chest: Effort normal. No respiratory distress. He has no wheezes. He has no rales.  Poor air movement, no added sounds, on 2 liters of oxygen by nasal canula  Abdominal: Soft. Bowel sounds are normal. There is no tenderness. There is no rebound and no guarding.  Musculoskeletal: Normal range of motion.  Able to move all 4 extremities, trace leg edema, unsteady gait, uses rollator walker with brake and seat  Lymphadenopathy:    He has no cervical adenopathy.  Neurological: He is alert.  Oriented to person only  Skin: Skin is warm and dry. He is not diaphoretic.  Psychiatric: He has a normal mood and affect. His behavior is normal.    Labs reviewed: Basic Metabolic Panel: Recent Labs    05/23/17 0947 05/23/17 0948 06/20/17 1054  08/01/17 1106  10/05/17 0920 10/06/17 0240 10/07/17 0351 10/14/17  NA  --  140 140   < > 142   < > 139 140 141 140  K  --  5.0 5.4*   < > 4.5   < > 5.5* 4.3 4.4 4.3  CL  --   --  107   < > 109   < > 109 108 108 108  CO2  --  24 26   < > 25   < > 22 22 22 23   GLUCOSE  --  81 104  --  65*   < > 106* 90 96  --   BUN  --  41.7* 35*   < > 26   < > 29* 28* 33* 33*  CREATININE  --  2.5* 2.30*   < > 1.69*   < > 1.90* 1.85* 1.77* 1.8*  CALCIUM  --  9.2 9.3   < > 9.3   < > 8.5* 8.8* 9.2 8.8  MG  --  3.2* 3.0*  --  2.4  --   --   --   --   --   PHOS 3.7  --  3.4  --   --   --   --   --   --   --    < > = values in this interval not displayed.   Liver Function Tests: Recent Labs    08/14/17 1434 09/03/17 1423 09/15/17 0924  AST 15 17 31   ALT 17 17 28   ALKPHOS 55 57 75  BILITOT 0.4 0.3 0.2  PROT 6.4 6.3* 7.0  ALBUMIN 3.2* 3.2* 3.3*   Recent Labs    04/09/17 1352  LIPASE 48   No results for input(s): AMMONIA in the last 8760 hours. CBC: Recent Labs    08/14/17 1434 09/03/17 1423 09/15/17 0924  10/06/17 0240 10/07/17 0351 10/08/17 0321 10/14/17  WBC 9.2 7.7 5.8   < > 7.0 7.2 7.5 6.7  NEUTROABS 7.6* 5.7  4.0  --   --   --   --   --   HGB 12.2* 12.8* 12.1*   < > 11.8* 12.2* 11.5* 12.0*  HCT 38.3* 41.0 38.5   < > 38.1* 39.4 36.9* 37*  MCV 87.7 90.3 89.5   < > 89.0 88.5 88.3  --   PLT 176 126* 153   < > 158 169 157 166   < > = values in this interval not displayed.   Cardiac Enzymes: Recent Labs    10/05/17 1521  10/05/17 2035 10/06/17 0240  TROPONINI 10.07* 10.29* 8.99*   BNP: Invalid input(s): POCBNP No results found for: HGBA1C Lab Results  Component Value Date   TSH 2.530 08/14/2017   Lab Results  Component Value Date   [id number] 003 01/23/2016   No results found for: FOLATE No results found for: IRON, TIBC, FERRITIN  Imaging and Procedures obtained prior to SNF admission: Ct Chest Wo Contrast  Result Date: 10/14/2017 CLINICAL DATA:  Metastatic lung cancer (adenocarcinoma) post immunotherapy. EXAM: CT CHEST WITHOUT CONTRAST TECHNIQUE: Multidetector CT imaging of the chest was performed following the standard protocol without IV contrast. COMPARISON:  Portable chest 10/07/2017. Chest CT 08/01/2017. PET-CT 03/17/2017. FINDINGS: Cardiovascular: Atherosclerosis of aorta, great vessels and coronary arteries status post median sternotomy and CABG. The heart size is normal. There is no pericardial effusion. Mediastinum/Nodes: There are no enlarged mediastinal, hilar or axillary lymph nodes.There are calcified left hilar lymph nodes. There is a large hiatal hernia. Low-density nodule involving the lower pole of the right thyroid lobe is not as well seen. There is a stable small calcification inferiorly in the left thyroid lobe. Lungs/Pleura: Moderate size left pleural effusion has decreased in volume. There is minimal pleural fluid on the right. There has been interval partial re-expansion of the left upper and lower lobes. Left upper lobe lesion which was hypermetabolic on prior PET-CT measures approximately 1.8 x 1.6 cm on image 26/7, partly obscured by atelectasis on the most recent  study. There are scattered small right lung nodules. No new or enlarging nodules identified. Upper abdomen: The visualized upper abdomen appears stable without suspicious findings. There is a stable 17 mm right adrenal nodule on image 134/2. Musculoskeletal/Chest wall: There is no chest wall mass or suspicious osseous finding. Stable multilevel spondylosis. IMPRESSION: 1. Interval decreased size of left pleural effusion, now moderate in size. There is associated improved aeration of the left lung. 2. The hypermetabolic left upper lobe lesion has decreased in size from the PET-CT of 03/17/2017. Scattered small right lung nodules are stable. No evidence of disease progression. 3. Sequela of prior granulomatous disease. 4. Large hiatal hernia. 5.  Aortic Atherosclerosis (ICD10-I70.0). Electronically Signed   By: Richardean Sale M.D.   On: 10/14/2017 13:31    Assessment/Plan  1. Physical deconditioning Will have him work with physical therapy and occupational therapy team to help with gait training and muscle strengthening exercises.fall precautions. Skin care. Encourage to be out of bed.   2. Chronic respiratory failure with hypoxia (HCC) Continue oxygen 2l/min by nasal canula, breo ellipta daily, prn albuterol and thoracocentesis as needed for recurrent left sided pleural effusion. F/u with oncology  3. NSTEMI (non-ST elevated myocardial infarction) (HCC) Continue imdur 30 mg daily with aspirin 81 mg daily and plavix 75 mg daily. After a month, d/c plavix. Continue atorvastatin 40 mg daily. Make f/u with cardiology in 1 week. Continue prn NTG  4. Essential hypertension Controlled BP, continue imdur for now. Monitor BP, reviewed BMP  5. Chronic diastolic CHF (congestive heart failure) (HCC) euvolemic on exam, monitor  6. Primary adenocarcinoma of upper lobe of left lung (HCC) Has metastatic lung cancer. Continue tagrisso. Continue oxygen by nasal canula and prn albuterol  7. Recurrent left  pleural effusion S/p left sided thoracocentesis on 10/07/17. Breathing stable.   8. Gastroesophageal reflux disease, esophagitis presence not specified Continue pantoprazole and monitor, has large hiatal hernia  9. CKD (chronic kidney disease), stage III ([place]) Reviewed BMP, maintain hydration.   10. Major depressive disorder in  remission, unspecified whether recurrent (HCC) Continue desvenlafaxine 25 mg daily and monitor his mood. Continue bupropion as well  11. Cognitive impairment Supportive care.     Family/ staff Communication: reviewed care plan with patient and charge nurse.    Labs/tests ordered: cbc, cmp with eGFR, palliative consult, cardiology and oncology f/u  [name], MD Internal Medicine [place], [place] [address] Cell Phone (Monday-Friday 8 am - 5 pm): [phone] On Call: [phone] and follow prompts after 5 pm and on weekends Office Phone: [phone] Office Fax: [phone]

## 2017-10-15 ENCOUNTER — Other Ambulatory Visit: Payer: Self-pay | Admitting: *Deleted

## 2017-10-16 ENCOUNTER — Encounter: Payer: Medicare Other | Admitting: Nurse Practitioner

## 2017-10-17 ENCOUNTER — Non-Acute Institutional Stay: Payer: Medicare Other | Admitting: Hospice and Palliative Medicine

## 2017-10-17 DIAGNOSIS — Z515 Encounter for palliative care: Secondary | ICD-10-CM

## 2017-10-17 NOTE — Progress Notes (Signed)
PALLIATIVE CARE CONSULT VISIT   PATIENT NAME: Scott [name] DOB: April 05, 1924 MRN: [id number]  PRIMARY CARE PROVIDER: [name], MD  REFERRING PROVIDER: [name], MD [place], [place] [address]  RESPONSIBLE PARTY:   Multiple children listed.Wife with memory problems as well per PCP.   RECOMMENDATIONS and PLAN:  1.Generalized weakness: secondary to lung CA, s/p MI and pleural effusion s/p drainage via pleuravac in hospital. [place] [facility]. He is currently in the SNF side having just completed PT/OT. His goals are for comfort measures and [place]. He continues on immunotherapy. He has a follow up appointment with Dr. Irene Limbo next week, as well as with cardiology. I would recommend Hospice services but immunotherapy would not be a part of that plan. Need to meet with patient's family to discuss options. SW from facility will organize our meeting for Monday afternoon if possible. Dr. Bubba Camp would like for [place] to discuss pleura vac and immunotherapy and describe hospice services and how it could help support patient and his wife in the independent setting. 2. ACP: DNR already in place. Will discuss goals of care on Monday with patient and his family.  I spent 25 minutes providing this consultation,  from 4:30pm to 4:55pm. More than 50% of the time in this consultation was spent reviewing records, interviewing staff, assessing patient and coordinating communication.   HISTORY OF PRESENT ILLNESS:  Scott [name] is a 84 y.o.  male with multiple medical problems including lung cancer, CAD with CABG, MI x 3 with recent hospitalization for NSTEMI and reoccurring pleural effusion, weight loss as well as others. Palliative Care was asked to help address goals of care.   CODE STATUS: DNR  PPS: 0% HOSPICE ELIGIBILITY/DIAGNOSIS: stage IV lung cancer/CAD s/p CABG with MI x 3  PAST MEDICAL HISTORY:  Past Medical History:  Diagnosis Date   . Anxiety   . Bradycardia    a. BB d/c'd in 2016.  [name] CAD (coronary artery disease)    a. 1983 s/p CABG [facility] [place]);  b. 2007 NSTEMI->med Rx for distal LCX dzs; c. 09/2014 NSTEMI/Cath: LIMA->LAD & diag ok, native RCA dominant and patent.   VG->RCA 100, VG->LCX 100 -->Med Rx.  . CKD (chronic kidney disease), stage III ([place])   . Claudication ([place])    BLE  . Depression   . Diastolic dysfunction    a. 10/2014 Echo: F 60-65%, Gr1 DD, mild AS, mild AI, mild to mod MR, PASP 48mmHg.  [name] Dyspnea 2010   syndrome - extensive  . Erectile dysfunction   . GERD (gastroesophageal reflux disease)   . H/O hiatal hernia   . History of blood transfusion 2014  . Hx of adenomatous colonic polyps 2007  . Hyperlipidemia   . Hypertensive heart disease   . Inguinal hernia   . Iron deficiency anemia   . Lung cancer ([place])   . Shingles 1990's  . Sigmoid diverticulosis   . Upper GI bleeding 2014   Gastritis and possible duodenal ulcer seen on EGD, felt secondary to NSAIDs  . Urethral stricture 11/2008  . Vasovagal syncope 05/2006  . Weight loss     SOCIAL HX:  Social History   Tobacco Use  . Smoking status: Never Smoker  . Smokeless tobacco: Never Used  Substance Use Topics  . Alcohol use: Not Currently    Alcohol/week: 4.2 oz    Types: 7 Glasses of wine per week  Comment: wine - daily - currently not drinking    ALLERGIES: No Known Allergies   PERTINENT MEDICATIONS:  Outpatient Encounter Medications as of 10/17/2017  Medication Sig  . acetaminophen (TYLENOL) 500 MG tablet Take 500 mg by mouth every 6 (six) hours as needed for mild pain or moderate pain.  [name] albuterol (PROAIR HFA) 108 (90 Base) MCG/ACT inhaler Inhale 2 puffs into the lungs every 6 (six) hours as needed for wheezing or shortness of breath.  [name] aspirin EC 81 MG EC tablet Take 1 tablet (81 mg total) by mouth daily.  [name] atorvastatin (LIPITOR) 40 MG tablet Take 1 tablet (40 mg total) by mouth daily at 6 PM.  . BREO ELLIPTA 100-25  MCG/INH AEPB Inhale 1 puff into the lungs daily.  [name] buPROPion (WELLBUTRIN SR) 150 MG 12 hr tablet Take 150 mg by mouth daily.  . clopidogrel (PLAVIX) 75 MG tablet Take 1 tablet (75 mg total) by mouth daily.  [name] Desvenlafaxine Succinate ER 25 MG TB24 Take 25 mg by mouth daily.  [name] FERROCITE 324 MG TABS tablet TAKE 1 TABLET BY MOUTH QOD  . isosorbide mononitrate (IMDUR) 30 MG 24 hr tablet Take 30 mg by mouth daily.  . nitroGLYCERIN (NITROSTAT) 0.4 MG SL tablet Place 1 tablet (0.4 mg total) under the tongue every 5 (five) minutes x 3 doses as needed for chest pain.  . Omega-3 Fatty Acids (FISH OIL PO) Take 1 capsule by mouth daily.  . OXYGEN Inhale 2 L into the lungs continuous.  . pantoprazole (PROTONIX) 40 MG tablet Take 1 tablet (40 mg total) by mouth 2 (two) times daily before a meal.  . pregabalin (LYRICA) 50 MG capsule Take 50 mg by mouth as needed.   [name] TAGRISSO 80 MG tablet TAKE 1 TABLET (80 MG TOTAL) BY MOUTH DAILY.  . vitamin B-12 (CYANOCOBALAMIN) 500 MCG tablet Take 500 mcg by mouth daily. complex   No facility-administered encounter medications on file as of 10/17/2017.     PHYSICAL EXAM:   General: alert, Caucasian male sitting in recliner wearing [place] in NAD Cardiovascular: irreg rhythm; reg rate Pulmonary: diminished in bases; poor oxygen exchange; no wheeze or rhonchi; wearing oxygen at 2 liters Abdomen: soft, active BS; NTTP  Extremities: able to walk with rolling seated walker; using both upper extremities Skin: thin, easily bruised Neurological: alert; oriented to name and place: has easy conversation; +generalized weakness  Nathanial Rancher, NP

## 2017-10-17 NOTE — Progress Notes (Signed)
HEMATOLOGY/ONCOLOGY CLINIC NOTE  Date of Service: 10/20/17  Patient Care Team: [name], MD as PCP - General (Internal Medicine) Jettie Booze, MD as PCP - Cardiology (Cardiology) Mast, [name], NP as Nurse Practitioner (Internal Medicine) [facility] as Nurse Practitioner (Hospice and Palliative Medicine)  CHIEF COMPLAINTS/PURPOSE OF CONSULTATION:   F/u for EGFR mutated metastatic lung adenocarcinoma  HISTORY OF PRESENTING ILLNESS:   Scott [name] is a wonderful 84 y.o. male who has been referred to [place] by Dr. [name], MD for evaluation of suspected lung cancer.   He has a hx of CAD, CKD, Diastolic CHF, HTN, HLD. He presented to the ED on 03/03/2017 for SOB and was found to have pleural effusion. He was also to have a mass in the upper lobe of left lung after a CT chest Wo contrast with results showing:IMPRESSION:1. 2.4 x 3.6 cm apparent mass in the posterior left upper lobe with associated left upper lobe interlobular septal thickening. Imaging features concerning for neoplasm with lymphangitic tumor spread. 2. Small left pleural effusion. 3. Large hiatal hernia contains nearly the entire stomach with evidence of organoaxial volvulus. No evidence for gastric obstruction. 4. 19 mm right adrenal adenoma.  He is accompanied by his wife and daughter. He states that he began feeling SOB 2 weeks ago. He is not a smoker and has not smoked in the past. He states that he has not been evaluated by his cardiologist, Dr. Larae Grooms. For his recent SOB.  On review of systems, patient reports fatigue, intermittent SOB, and denies fever, chills, CP, cough, weight loss, and any other accompanying symptoms.  INTERVAL HISTORY:   Scott [name] is here for f/u of his EGFR mutated lung adenocarcinoma. The patient's last visit with [place] was on 09/15/17. He is accompanied today by two family members. The pt reports that he is doing well overall.   The pt was  admitted to the hospital on 10/05/17 for a NSTEMI and was discharged with Aspirin and Plavix for one month. The pt had a thoracentesis on 10/07/17 which removed 1.6L.   The pt reports that he is breathing well and is now using 2L oxygen. He denies any chest pain since being discharged.   Of note since the patient's last visit, pt has had CT Chest completed on 10/14/17 with results revealing 1. Interval decreased size of left pleural effusion, now moderate in size. There is associated improved aeration of the left lung. 2. The hypermetabolic left upper lobe lesion has decreased in size from the PET-CT of 03/17/2017. Scattered small right lung nodules are stable. No evidence of disease progression. 3. Sequela of prior granulomatous disease. 4. Large hiatal hernia. 5.  Aortic Atherosclerosis .  Lab results today (10/20/17) of CBC, CMP, and Reticulocytes is as follows: all values are WNL except for Hgb at 12.5, MCHC at 31.4, Creatinine at 1.92, Albumin at 3.3.  On review of systems, pt reports eating well, strong appetite, and denies chest pain, breathing difficulty, nausea, mouth sores, abdominal pains, leg swelling, and any other symptoms.    MEDICAL HISTORY:  Past Medical History:  Diagnosis Date  . Anxiety   . Bradycardia    a. BB d/c'd in 2016.  [name] CAD (coronary artery disease)    a. 1983 s/p CABG [facility] [place]);  b. 2007 NSTEMI->med Rx for distal LCX dzs; c. 09/2014 NSTEMI/Cath: LIMA->LAD & diag ok, native RCA dominant and patent.   VG->RCA 100, VG->LCX 100 -->Med Rx.  [name]  CKD (chronic kidney disease), stage III ([place])   . Claudication ([place])    BLE  . Depression   . Diastolic dysfunction    a. 10/2014 Echo: F 60-65%, Gr1 DD, mild AS, mild AI, mild to mod MR, PASP 30mHg.  .Marland KitchenDyspnea 2010   syndrome - extensive  . Erectile dysfunction   . GERD (gastroesophageal reflux disease)   . H/O hiatal hernia   . History of blood transfusion 2014  . Hx of adenomatous colonic polyps 2007  .  Hyperlipidemia   . Hypertensive heart disease   . Inguinal hernia   . Iron deficiency anemia   . [place]   . Shingles 1990's  . Sigmoid diverticulosis   . Upper GI bleeding 2014   Gastritis and possible duodenal ulcer seen on EGD, felt secondary to NSAIDs  . Urethral stricture 11/2008  . Vasovagal syncope 05/2006  . Weight loss     SURGICAL HISTORY: Past Surgical History:  Procedure Laterality Date  . CARDIAC CATHETERIZATION  2007   Left main 100%, RCA 70% ostial, all grafts were patent, EF 40-45 percent, distal circumflex subtotal after graft insertion, medical therapy   . CARDIAC CATHETERIZATION N/A 10/14/2014   Procedure: Left Heart Cath and Cors/Grafts Angiography;  Surgeon: JLorretta Harp MD;  [facility];  Service: Cardiovascular;  Laterality: N/A;  . CHOLECYSTECTOMY  1980's  . CORONARY ARTERY BYPASS GRAFT  1994    LIMA-LAD, SVG-D1, SVG-OM,  SVG[place]RCA[place] . ESOPHAGOGASTRODUODENOSCOPY N/A 03/25/2013   Procedure: ESOPHAGOGASTRODUODENOSCOPY (EGD);  Surgeon: JMissy Sabins MD;  gastritis, cannot rule out duodenal ulcer   . INGUINAL HERNIA REPAIR Left   . IR THORACENTESIS ASP PLEURAL SPACE W/IMG GUIDE  10/07/2017  . TRANSURETHRAL RESECTION OF PROSTATE      SOCIAL HISTORY: Social History   Socioeconomic History  . Marital status: Married    Spouse name: Not on file  . Number of children: Not on file  . Years of education: Not on file  . Highest education level: Not on file  Occupational History  . Occupation: Retired  SScientific laboratory technician . Financial resource strain: Not hard at all  . Food insecurity:    Worry: Never true    Inability: Never true  . Transportation needs:    Medical: No    Non-medical: No  Tobacco Use  . Smoking status: Never Smoker  . Smokeless tobacco: Never Used  Substance and Sexual Activity  . Alcohol use: Not Currently    Alcohol/week: 4.2 oz    Types: 7 Glasses of wine per week     Comment: wine - daily - currently not drinking  . Drug use: No  . Sexual activity: Never    Birth control/protection: Abstinence  Lifestyle  . Physical activity:    Days per week: 0 days    Minutes per session: 0 min  . Stress: Not at all  Relationships  . Social connections:    Talks on phone: More than three times a week    Gets together: More than three times a week    Attends religious service: Never    Active member of club or organization: No    Attends meetings of clubs or organizations: Never    Relationship status: Married  . Intimate partner violence:    Fear of current or ex partner: No    Emotionally abused: No    Physically abused: No    Forced sexual activity: No  Other Topics Concern  . Not on file  Social History Narrative   He lives in [facility], independent living, with his wife.    FAMILY HISTORY: Family History  Problem Relation Age of Onset  . Hypertension Mother   . Heart disease Mother   . Heart disease Father   . Heart failure Brother   . Heart failure Sister   . Heart failure Brother   . Heart attack Neg Hx   . Stroke Neg Hx     ALLERGIES:  has No Known Allergies.  MEDICATIONS:  Current Outpatient Medications  Medication Sig Dispense Refill  . acetaminophen (TYLENOL) 500 MG tablet Take 500 mg by mouth every 6 (six) hours as needed for mild pain or moderate pain.    [name] albuterol (PROAIR HFA) 108 (90 Base) MCG/ACT inhaler Inhale 2 puffs into the lungs every 6 (six) hours as needed for wheezing or shortness of breath.    [name] aspirin EC 81 MG EC tablet Take 1 tablet (81 mg total) by mouth daily.    [name] atorvastatin (LIPITOR) 40 MG tablet Take 1 tablet (40 mg total) by mouth daily at 6 PM. 30 tablet 0  . BREO ELLIPTA 100-25 MCG/INH AEPB Inhale 1 puff into the lungs daily.    [name] buPROPion (WELLBUTRIN SR) 150 MG 12 hr tablet Take 150 mg by mouth daily.    . clopidogrel (PLAVIX) 75 MG tablet Take 1 tablet (75 mg total) by mouth daily. 30 tablet 0  .  Desvenlafaxine Succinate ER 25 MG TB24 Take 25 mg by mouth daily.    [name] FERROCITE 324 MG TABS tablet TAKE 1 TABLET BY MOUTH QOD  11  . isosorbide mononitrate (IMDUR) 30 MG 24 hr tablet Take 30 mg by mouth daily.    . nitroGLYCERIN (NITROSTAT) 0.4 MG SL tablet Place 1 tablet (0.4 mg total) under the tongue every 5 (five) minutes x 3 doses as needed for chest pain. 25 tablet 4  . Omega-3 Fatty Acids (FISH OIL PO) Take 1 capsule by mouth daily.    . OXYGEN Inhale 2 L into the lungs continuous.    . pantoprazole (PROTONIX) 40 MG tablet Take 1 tablet (40 mg total) by mouth 2 (two) times daily before a meal. 60 tablet 3  . pregabalin (LYRICA) 50 MG capsule Take 50 mg by mouth as needed.     [name] TAGRISSO 80 MG tablet TAKE 1 TABLET (80 MG TOTAL) BY MOUTH DAILY. 30 tablet 2  . vitamin B-12 (CYANOCOBALAMIN) 500 MCG tablet Take 500 mcg by mouth daily. complex     No current facility-administered medications for this visit.     REVIEW OF SYSTEMS:    A 10+ POINT REVIEW OF SYSTEMS WAS OBTAINED including neurology, dermatology, psychiatry, cardiac, respiratory, lymph, extremities, GI, GU, Musculoskeletal, constitutional, breasts, reproductive, HEENT.  All pertinent positives are noted in the HPI.  All others are negative.   PHYSICAL EXAMINATION:  ECOG PERFORMANCE STATUS: 2 - Symptomatic, <50% confined to bed  .BP 133/69 (BP Location: Left Arm, Patient Position: Sitting)   Pulse 67   Temp 97.8 F (36.6 C) (Oral)   Resp 18   Ht 5' 6"  (1.676 m)   Wt 147 lb 4.8 oz (66.8 kg)   SpO2 100%   BMI 23.77 kg/m  GENERAL:alert, in no acute distress and comfortable SKIN: no acute rashes, no significant lesions EYES: conjunctiva are pink and non-injected, sclera anicteric OROPHARYNX: MMM, no exudates, no oropharyngeal erythema or ulceration NECK: supple, no JVD  LYMPH:  no palpable lymphadenopathy in the cervical, axillary or inguinal regions LUNGS: left lung base decreased breath sounds HEART: regular rate &  rhythm ABDOMEN:  normoactive bowel sounds , non tender, not distended. Extremity: trace pedal edema PSYCH: alert & oriented x 3 with fluent speech NEURO: no focal motor/sensory deficits   LABORATORY DATA:  I have reviewed the data as listed  . CBC Latest Ref Rng & Units 10/21/2017 10/20/2017 10/14/2017  WBC - 6.4 7.2 6.7  Hemoglobin 13.5 - 17.5 11.8(A) 12.5(L) 12.0(A)  Hematocrit 41 - 53 36(A) 39.8 37(A)  Platelets 150 - 399 159 141 166  HGB 12.2  . CMP Latest Ref Rng & Units 10/21/2017 10/20/2017 10/14/2017  Glucose 70 - 140 mg/dL - 88 -  BUN 4 - 21 29(A) 26 33(A)  Creatinine 0.6 - 1.3 1.8(A) 1.92(H) 1.8(A)  Sodium 137 - 147 141 141 140  Potassium 3.4 - 5.3 4.1 4.5 4.3  Chloride - 108 106 108  CO2 - 25 26 23   Calcium - 9.0 9.4 8.8  Total Protein 6.4 - 8.3 g/dL - 6.5 -  Total Bilirubin 0.2 - 1.2 mg/dL - 0.4 -  Alkaline Phos 25 - 125 67 78 -  AST 14 - 40 17 21 -  ALT 10 - 40 17 20 -   Component     Latest Ref Rng & Units 08/14/2017  TSH     0.450 - 4.500 uIU/mL 2.530  Thyroxine (T4)     4.5 - 12.0 ug/dL 6.5  T3 Uptake Ratio     24 - 39 % 26  Free Thyroxine Index     1.2 - 4.9 1.7     RADIOGRAPHIC STUDIES: I have personally reviewed the radiological images as listed and agreed with the findings in the report. Dg Chest 1 View  Result Date: 10/07/2017 CLINICAL DATA:  Status post left-sided thoracentesis. EXAM: CHEST  1 VIEW COMPARISON:  Radiographs of Oct 05, 2017. FINDINGS: Stable cardiomegaly. Sternotomy wires are noted. No pneumothorax is noted. Left pleural effusion is significantly smaller status post thoracentesis. Mild bibasilar subsegmental atelectasis and residual fusions are noted. Bony thorax is unremarkable. IMPRESSION: Left pleural effusion is significantly smaller status post left-sided thoracentesis. No pneumothorax is noted. Electronically Signed   By: Marijo Conception, M.D.   On: 10/07/2017 16:13   Dg Chest 1 View  Result Date: 09/22/2017 CLINICAL DATA:  Status  post left-sided thoracentesis EXAM: CHEST  1 VIEW COMPARISON:  Chest x-ray of September 15, 2017 FINDINGS: The volume of pleural fluid on the left has markedly decreased. Patchy linear density at the left lung base is present and there is obscuration of the left hemidiaphragm. There is a trace of pleural fluid blunting the right lateral costophrenic angle. The right lung is well-expanded and clear. There is a large hiatal hernia. The heart and pulmonary vascularity are normal. The patient has undergone previous median sternotomy and internal mammary artery dissection. IMPRESSION: Interval decrease in the volume of pleural fluid on the left without evidence of postprocedure pneumothorax. Persistent left basilar atelectasis or pneumonia. New trace right pleural effusion. Electronically Signed   By: David  Martinique M.D.   On: 09/22/2017 11:25   Dg Chest 2 View  Result Date: 10/05/2017 CLINICAL DATA:  SOB and wheezing this AM; pt reports he was having pain but said he wasn't sure he could tell me where his pain was; former smoker EXAM: CHEST - 2 VIEW COMPARISON:  569 FINDINGS: Stable enlarged cardiac silhouette. Large hiatal  hernia posterior to the heart. Increasing LEFT pleural effusion which extends along the LEFT lateral chest wall superiorly. Mild central venous congestion is increased. IMPRESSION: 1. Increasing LEFT effusion and central venous congestion. 2. Large hiatal hernia. Electronically Signed   By: Suzy Bouchard M.D.   On: 10/05/2017 10:05   Ct Chest Wo Contrast  Result Date: 10/14/2017 CLINICAL DATA:  Metastatic lung cancer (adenocarcinoma) post immunotherapy. EXAM: CT CHEST WITHOUT CONTRAST TECHNIQUE: Multidetector CT imaging of the chest was performed following the standard protocol without IV contrast. COMPARISON:  Portable chest 10/07/2017. Chest CT 08/01/2017. PET-CT 03/17/2017. FINDINGS: Cardiovascular: Atherosclerosis of aorta, great vessels and coronary arteries status post median sternotomy  and CABG. The heart size is normal. There is no pericardial effusion. Mediastinum/Nodes: There are no enlarged mediastinal, hilar or axillary lymph nodes.There are calcified left hilar lymph nodes. There is a large hiatal hernia. Low-density nodule involving the lower pole of the right thyroid lobe is not as well seen. There is a stable small calcification inferiorly in the left thyroid lobe. Lungs/Pleura: Moderate size left pleural effusion has decreased in volume. There is minimal pleural fluid on the right. There has been interval partial re-expansion of the left upper and lower lobes. Left upper lobe lesion which was hypermetabolic on prior PET-CT measures approximately 1.8 x 1.6 cm on image 26/7, partly obscured by atelectasis on the most recent study. There are scattered small right lung nodules. No new or enlarging nodules identified. Upper abdomen: The visualized upper abdomen appears stable without suspicious findings. There is a stable 17 mm right adrenal nodule on image 134/2. Musculoskeletal/Chest wall: There is no chest wall mass or suspicious osseous finding. Stable multilevel spondylosis. IMPRESSION: 1. Interval decreased size of left pleural effusion, now moderate in size. There is associated improved aeration of the left lung. 2. The hypermetabolic left upper lobe lesion has decreased in size from the PET-CT of 03/17/2017. Scattered small right lung nodules are stable. No evidence of disease progression. 3. Sequela of prior granulomatous disease. 4. Large hiatal hernia. 5.  Aortic Atherosclerosis (ICD10-I70.0). Electronically Signed   By: Richardean Sale M.D.   On: 10/14/2017 13:31   Ir Thoracentesis Asp Pleural Space W/img Guide  Result Date: 10/07/2017 INDICATION: Patient with recurrent left pleural effusion. Request is made for therapeutic thoracentesis. EXAM: ULTRASOUND GUIDED THERAPEUTIC THORACENTESIS MEDICATIONS: 10 mL 2% lidocaine COMPLICATIONS: None immediate. PROCEDURE: An ultrasound  guided thoracentesis was thoroughly discussed with the patient and questions answered. The benefits, risks, alternatives and complications were also discussed. The patient understands and wishes to proceed with the procedure. Written consent was obtained. Ultrasound was performed to localize and mark an adequate pocket of fluid in the left chest. The area was then prepped and draped in the normal sterile fashion. 2% lidocaine was used for local anesthesia. Under ultrasound guidance a 6 Fr Safe-T-Centesis catheter was introduced. Thoracentesis was performed. The catheter was removed and a dressing applied. FINDINGS: A total of approximately 1.6 liters of yellow fluid was removed. Samples were sent to the laboratory as requested by the clinical team. IMPRESSION: Successful ultrasound guided therapeutic left thoracentesis yielding 1.6 L of pleural fluid. Read by: Brynda Greathouse PA-C Follow-up chest radiograph shows no pneumothorax. Electronically Signed   By: Lucrezia Europe M.D.   On: 10/07/2017 16:07   US Thoracentesis Asp Pleural Space W/img Guide  Result Date: 09/22/2017 INDICATION: Patient with history of metastatic adenocarcinoma of the lung with recurrent malignant left pleural effusion, dyspnea. Request made for diagnostic and therapeutic left  thoracentesis. EXAM: ULTRASOUND GUIDED DIAGNOSTIC AND THERAPEUTIC LEFT THORACENTESIS MEDICATIONS: None COMPLICATIONS: None immediate. PROCEDURE: An ultrasound guided thoracentesis was thoroughly discussed with the patient and questions answered. The benefits, risks, alternatives and complications were also discussed. The patient understands and wishes to proceed with the procedure. Written consent was obtained. Ultrasound was performed to localize and mark an adequate pocket of fluid in the left chest. The area was then prepped and draped in the normal sterile fashion. 1% Lidocaine was used for local anesthesia. Under ultrasound guidance a 6 Fr Safe-T-Centesis catheter was  introduced. Thoracentesis was performed. The catheter was removed and a dressing applied. FINDINGS: A total of approximately 1.7 liters of slightly hazy, yellow fluid was removed. Samples were sent to the laboratory as requested by the clinical team. Due to patient chest discomfort only the above amount of fluid was removed today. IMPRESSION: Successful ultrasound guided diagnostic and therapeutic left thoracentesis yielding 1.7 liters of pleural fluid. Follow-up chest x-ray revealed no pneumothorax. Electronically Signed   By: Lucrezia Europe M.D.   On: 09/22/2017 11:30    ASSESSMENT & PLAN:   Scott [name] is a wonderful 84 y.o. male with  1) Metastatic Left lung Adenocarcinoma involving LUL lung mass left lung with associated moderated malignant left pleural effusion and left hilar LNadenopathy. EGFR mutated lung adenocarcinoma  2) DOE improved s/p recent therapeutic thoracentesis. But rpt CXR today still shows significant left pleural effusion which on recent decubitus XR was noted to be free flowing.  3) Grade 1-2 fatigue -- multifactorial Age+ significant burden of medical issues + SOE/DOE from pleural effusion + fatigue from medication. Much improved with dexamethasone and after recent therapeutic thoracentesis. PLAN   -Discussed pt labwork today, 10/20/17; HGB improved to 12.5, Creatinine stable at 1.92 -Reviewed 10/14/17 CT with pt and family which revealed The hypermetabolic left upper lobe lesion has decreased in size from the PET-CT of 03/17/2017. Scattered small right lung nodules are stable. No evidence of disease progression.  -Heart and CKD are likely also at play with pleural effusions -Recommended that the pt continue optimizing fluid status with cardiologist -The pt has no prohibitive toxicities from continuing [place] at this time.  -Pleurex catheter not recommended at this time given about-monthly frequency of draining, desire to prevent infections, and with reasonable control  of fluid status - pt agrees with this.  -Will see pt back in 4 weeks with CXR -O2 sat in clinic today at 100% on 2L/min  3) Grade 1-2 Fatigue - multifactorial -- improved some. Component     Latest Ref Rng & Units 08/14/2017  TSH     0.450 - 4.500 uIU/mL 2.530  Thyroxine (T4)     4.5 - 12.0 ug/dL 6.5  T3 Uptake Ratio     24 - 39 % 26  Free Thyroxine Index     1.2 - 4.9 1.7   -thyroid function tests WNL -optimizing nutrition -maintain active lifestyle.  4) Anorexia. -improved with low dose dexamethasone. -encouraged to continue good po intake. -Advised with the patientto continue to use boost or ensure as a nutritional supplement.    CXR in 1 month RTC with Dr Irene Limbo in 1 month with labs    All of the patients questions were answered with apparent satisfaction. The patient knows to call the clinic with any problems, questions or concerns.  . The total time spent in the appointment was 25 minutes and more than 50% was on counseling and direct patient cares.  Sullivan Lone MD [place] AAHIVMS [facility] Hematology/Oncology Physician [facility]  (Office):       [phone] (Work cell):  [phone] (Fax):           [phone]  I, Baldwin [place], am acting as a scribe for Dr Irene Limbo.   .I have reviewed the above documentation for accuracy and completeness, and I agree with the above. Brunetta Genera MD MS

## 2017-10-20 ENCOUNTER — Telehealth: Payer: Self-pay | Admitting: Hematology

## 2017-10-20 ENCOUNTER — Non-Acute Institutional Stay: Payer: Medicare Other | Admitting: Hospice and Palliative Medicine

## 2017-10-20 ENCOUNTER — Inpatient Hospital Stay: Payer: Medicare Other

## 2017-10-20 ENCOUNTER — Inpatient Hospital Stay: Payer: Medicare Other | Attending: Hematology | Admitting: Hematology

## 2017-10-20 VITALS — BP 133/69 | HR 67 | Temp 97.8°F | Resp 18 | Ht 66.0 in | Wt 147.3 lb

## 2017-10-20 DIAGNOSIS — C3490 Malignant neoplasm of unspecified part of unspecified bronchus or lung: Secondary | ICD-10-CM

## 2017-10-20 DIAGNOSIS — R0602 Shortness of breath: Secondary | ICD-10-CM | POA: Diagnosis not present

## 2017-10-20 DIAGNOSIS — Z515 Encounter for palliative care: Secondary | ICD-10-CM

## 2017-10-20 DIAGNOSIS — K449 Diaphragmatic hernia without obstruction or gangrene: Secondary | ICD-10-CM | POA: Insufficient documentation

## 2017-10-20 DIAGNOSIS — J910 Malignant pleural effusion: Secondary | ICD-10-CM

## 2017-10-20 DIAGNOSIS — R630 Anorexia: Secondary | ICD-10-CM | POA: Diagnosis not present

## 2017-10-20 DIAGNOSIS — I2510 Atherosclerotic heart disease of native coronary artery without angina pectoris: Secondary | ICD-10-CM

## 2017-10-20 DIAGNOSIS — C3412 Malignant neoplasm of upper lobe, left bronchus or lung: Secondary | ICD-10-CM

## 2017-10-20 DIAGNOSIS — R5383 Other fatigue: Secondary | ICD-10-CM | POA: Insufficient documentation

## 2017-10-20 DIAGNOSIS — K219 Gastro-esophageal reflux disease without esophagitis: Secondary | ICD-10-CM | POA: Diagnosis not present

## 2017-10-20 DIAGNOSIS — Z9981 Dependence on supplemental oxygen: Secondary | ICD-10-CM | POA: Diagnosis not present

## 2017-10-20 DIAGNOSIS — I130 Hypertensive heart and chronic kidney disease with heart failure and stage 1 through stage 4 chronic kidney disease, or unspecified chronic kidney disease: Secondary | ICD-10-CM

## 2017-10-20 DIAGNOSIS — I252 Old myocardial infarction: Secondary | ICD-10-CM

## 2017-10-20 DIAGNOSIS — I5032 Chronic diastolic (congestive) heart failure: Secondary | ICD-10-CM | POA: Diagnosis not present

## 2017-10-20 DIAGNOSIS — E785 Hyperlipidemia, unspecified: Secondary | ICD-10-CM | POA: Insufficient documentation

## 2017-10-20 DIAGNOSIS — Z7982 Long term (current) use of aspirin: Secondary | ICD-10-CM | POA: Diagnosis not present

## 2017-10-20 DIAGNOSIS — N183 Chronic kidney disease, stage 3 (moderate): Secondary | ICD-10-CM | POA: Insufficient documentation

## 2017-10-20 DIAGNOSIS — Z79899 Other long term (current) drug therapy: Secondary | ICD-10-CM | POA: Diagnosis not present

## 2017-10-20 LAB — CBC WITH DIFFERENTIAL (CANCER CENTER ONLY)
BASOS ABS: 0 10*3/uL (ref 0.0–0.1)
BASOS PCT: 0 %
EOS PCT: 4 %
Eosinophils Absolute: 0.3 10*3/uL (ref 0.0–0.5)
HEMATOCRIT: 39.8 % (ref 38.4–49.9)
Hemoglobin: 12.5 g/dL — ABNORMAL LOW (ref 13.0–17.1)
Lymphocytes Relative: 14 %
Lymphs Abs: 1 10*3/uL (ref 0.9–3.3)
MCH: 27.8 pg (ref 27.2–33.4)
MCHC: 31.4 g/dL — ABNORMAL LOW (ref 32.0–36.0)
MCV: 88.4 fL (ref 79.3–98.0)
MONO ABS: 0.7 10*3/uL (ref 0.1–0.9)
Monocytes Relative: 10 %
NEUTROS ABS: 5.2 10*3/uL (ref 1.5–6.5)
Neutrophils Relative %: 72 %
PLATELETS: 141 10*3/uL (ref 140–400)
RBC: 4.5 MIL/uL (ref 4.20–5.82)
RDW: 14.3 % (ref 11.0–14.6)
WBC: 7.2 10*3/uL (ref 4.0–10.3)

## 2017-10-20 LAB — CMP (CANCER CENTER ONLY)
ALBUMIN: 3.3 g/dL — AB (ref 3.5–5.0)
ALK PHOS: 78 U/L (ref 40–150)
ALT: 20 U/L (ref 0–55)
ANION GAP: 9 (ref 3–11)
AST: 21 U/L (ref 5–34)
BILIRUBIN TOTAL: 0.4 mg/dL (ref 0.2–1.2)
BUN: 26 mg/dL (ref 7–26)
CALCIUM: 9.4 mg/dL (ref 8.4–10.4)
CO2: 26 mmol/L (ref 22–29)
Chloride: 106 mmol/L (ref 98–109)
Creatinine: 1.92 mg/dL — ABNORMAL HIGH (ref 0.70–1.30)
GFR, EST NON AFRICAN AMERICAN: 29 mL/min — AB (ref >60)
GFR, Est AFR Am: 33 mL/min — ABNORMAL LOW (ref >60)
GLUCOSE: 88 mg/dL (ref 70–140)
POTASSIUM: 4.5 mmol/L (ref 3.5–5.1)
Sodium: 141 mmol/L (ref 136–145)
TOTAL PROTEIN: 6.5 g/dL (ref 6.4–8.3)

## 2017-10-20 LAB — RETICULOCYTES
RBC.: 4.5 MIL/uL (ref 4.20–5.82)
RETIC COUNT ABSOLUTE: 54 10*3/uL (ref 34.8–93.9)
Retic Ct Pct: 1.2 % (ref 0.8–1.8)

## 2017-10-20 MED ORDER — OSIMERTINIB MESYLATE 80 MG PO TABS: ORAL_TABLET | ORAL | 2 refills | Status: DC

## 2017-10-20 NOTE — Telephone Encounter (Signed)
Scheduled appt per 6/3 los - Gave patient avs.

## 2017-10-20 NOTE — Progress Notes (Signed)
Scheduled with SW of facility to meet with patient and family to discuss the following: Hospice vs palliative care; whether patient wants to continue immunotherapy, future pleurac drains? Future CT scans/MRI's? Comfort measures only? Advise how hospice could help provide equipment,oxygen, bathing, medication costs, more frequent visits etc. No family came today. Daughter was here earlier per staff to take patient out to see Dr. Irene Limbo. SW will attempt to gather family again for this discussion. Daughter has jury duty today.

## 2017-10-21 DIAGNOSIS — N183 Chronic kidney disease, stage 3 (moderate): Secondary | ICD-10-CM | POA: Diagnosis not present

## 2017-10-21 LAB — CBC AND DIFFERENTIAL
HEMATOCRIT: 36 — AB (ref 41–53)
HEMOGLOBIN: 11.8 — AB (ref 13.5–17.5)
PLATELETS: 159 (ref 150–399)
WBC: 6.4

## 2017-10-21 LAB — HEPATIC FUNCTION PANEL
ALK PHOS: 67 (ref 25–125)
ALT: 17 (ref 10–40)
AST: 17 (ref 14–40)
BILIRUBIN, TOTAL: 0.3

## 2017-10-21 LAB — BASIC METABOLIC PANEL
BUN: 29 — AB (ref 4–21)
Creatinine: 1.8 — AB (ref <=1.3)
Glucose: 114
Potassium: 4.1 (ref 3.4–5.3)
Sodium: 141 (ref 137–147)

## 2017-10-22 ENCOUNTER — Other Ambulatory Visit: Payer: Self-pay | Admitting: *Deleted

## 2017-10-22 LAB — COMPLETE METABOLIC PANEL WITH GFR
Albumin: 3.4
Calcium: 9
Carbon Dioxide, Total: 25
Chloride: 108
EGFR (Non-African Amer.): 32
Globulin: 2.3
Protein: 5.7

## 2017-10-24 ENCOUNTER — Non-Acute Institutional Stay: Payer: Medicare Other | Admitting: Hospice and Palliative Medicine

## 2017-10-24 DIAGNOSIS — Z7189 Other specified counseling: Secondary | ICD-10-CM

## 2017-10-24 DIAGNOSIS — R531 Weakness: Secondary | ICD-10-CM | POA: Diagnosis not present

## 2017-10-25 NOTE — Progress Notes (Signed)
PALLIATIVE CARE CONSULT VISIT   PATIENT NAME: Scott [name] DOB: [birth year] MRN: [id number]  PRIMARY CARE PROVIDER: [name], MD  REFERRING PROVIDER: [name], MD [place], [place] [id number]  RESPONSIBLE PARTY:   Daughters   RECOMMENDATIONS and PLAN:  1. Weakness: secondary to advanced age, recent MI and lung ca and malignant pleural effusion as well as others.  Has completed PT/OT/ST within SNF setting. He has improved tremendously.  Would recommend continuing therapy in the [place] setting as is his wish to go back there to be with his wife for as long as possible.  Discussed Hospice for support. Family wants to continue immunotherapy, as well as draining of pleural effusion. This was explained to be consistent with palliative but not hospice, unless a pleurax drain placed for Hospice nurse to drain in home. After discussion, family has chosen [facility], Psychologist, counselling for baths, PT/OT/ST therapy, to order oxygen from home health and to provide some for hire personal assistants as well. Will visit patient again at the end of next week once established back in [place] setting.  2. High fall risk: suggested a fall alert necklace. Staff putting this in place.  3. ACP: DNR status. MOST form indicates hospitalizations for IV fluids/IV abx, draining of lungs, no feeding tube. No ventilator.   I spent 60 minutes providing this consultation,  from 12:45pm  to 1:45pm. More than 50% of the time in this consultation was spent discussing goals of care and coordinating communication.   HISTORY OF PRESENT ILLNESS:  Scott [name] is a 84 y.o.  male with multiple medical problems including adenocarcinoma, malignant pleural effusion, non small cell lung cancer, CABG, S/P recent MI who is being discussed for discharge from SNF back to [place] setting at [facility].  Palliative Care was asked to meet with family and SW and [place] nurse/SW to  address goals of care, and to determine if family would  like support from Hospice services vs palliative care.   CODE STATUS: DNR  PPS: weak 40% HOSPICE ELIGIBILITY/DIAGNOSIS: eligible but not with goals of care  PAST MEDICAL HISTORY:  Past Medical History:  Diagnosis Date  . Anxiety   . Bradycardia    a. BB d/c'd in 2016.  [name] CAD (coronary artery disease)    a. 1983 s/p CABG [facility], [place]);  b. 2007 NSTEMI->med Rx for distal LCX dzs; c. 09/2014 NSTEMI/Cath: LIMA->LAD & diag ok, native RCA dominant and patent.   VG->RCA 100, VG->LCX 100 -->Med Rx.  . CKD (chronic kidney disease), stage III ([place])   . Claudication ([place])    BLE  . Depression   . Diastolic dysfunction    a. 10/2014 Echo: F 60-65%, Gr1 DD, mild AS, mild AI, mild to mod MR, PASP 50mmHg.  [name] Dyspnea 2010   syndrome - extensive  . Erectile dysfunction   . GERD (gastroesophageal reflux disease)   . H/O hiatal hernia   . History of blood transfusion 2014  . Hx of adenomatous colonic polyps 2007  . Hyperlipidemia   . Hypertensive heart disease   . Inguinal hernia   . Iron deficiency anemia   . Lung cancer ([place])   . Shingles 1990's  . Sigmoid diverticulosis   . Upper GI bleeding 2014   Gastritis and possible duodenal ulcer seen on EGD, felt secondary to NSAIDs  . Urethral stricture 11/2008  . Vasovagal syncope 05/2006  . Weight loss     SOCIAL HX:  Social History  Tobacco Use  . Smoking status: Never Smoker  . Smokeless tobacco: Never Used  Substance Use Topics  . Alcohol use: Not Currently    Alcohol/week: 4.2 oz    Types: 7 Glasses of wine per week    Comment: wine - daily - currently not drinking    ALLERGIES: No Known Allergies   PERTINENT MEDICATIONS:  Outpatient Encounter Medications as of 10/24/2017  Medication Sig  . acetaminophen (TYLENOL) 500 MG tablet Take 500 mg by mouth every 6 (six) hours as needed for mild pain or moderate pain.  [name] albuterol (PROAIR HFA) 108 (90 Base) MCG/ACT inhaler Inhale 2 puffs into the lungs every 6 (six) hours as needed for  wheezing or shortness of breath.  [name] aspirin EC 81 MG EC tablet Take 1 tablet (81 mg total) by mouth daily.  [name] atorvastatin (LIPITOR) 40 MG tablet Take 1 tablet (40 mg total) by mouth daily at 6 PM.  . BREO ELLIPTA 100-25 MCG/INH AEPB Inhale 1 puff into the lungs daily.  [name] buPROPion (WELLBUTRIN SR) 150 MG 12 hr tablet Take 150 mg by mouth daily.  . clopidogrel (PLAVIX) 75 MG tablet Take 1 tablet (75 mg total) by mouth daily.  [name] Desvenlafaxine Succinate ER 25 MG TB24 Take 25 mg by mouth daily.  [name] FERROCITE 324 MG TABS tablet TAKE 1 TABLET BY MOUTH QOD  . isosorbide mononitrate (IMDUR) 30 MG 24 hr tablet Take 30 mg by mouth daily.  . nitroGLYCERIN (NITROSTAT) 0.4 MG SL tablet Place 1 tablet (0.4 mg total) under the tongue every 5 (five) minutes x 3 doses as needed for chest pain.  . Omega-3 Fatty Acids (FISH OIL PO) Take 1 capsule by mouth daily.  [name] osimertinib mesylate (TAGRISSO) 80 MG tablet TAKE 1 TABLET (80 MG TOTAL) BY MOUTH DAILY.  [name] OXYGEN Inhale 2 L into the lungs continuous.  . pantoprazole (PROTONIX) 40 MG tablet Take 1 tablet (40 mg total) by mouth 2 (two) times daily before a meal.  . pregabalin (LYRICA) 50 MG capsule Take 50 mg by mouth as needed.   . vitamin B-12 (CYANOCOBALAMIN) 500 MCG tablet Take 500 mcg by mouth daily. complex   No facility-administered encounter medications on file as of 10/24/2017.     PHYSICAL EXAM:   General: Caucasian male wearing oxygen in NAD Cardiovascular: did not ausculate Pulmonary: breathing easily  Skin: fragile Neurological: alert; answers non complex questions +generalized weakness  Nathanial Rancher, NP

## 2017-10-28 ENCOUNTER — Encounter: Payer: Self-pay | Admitting: Cardiology

## 2017-10-28 ENCOUNTER — Encounter: Payer: Self-pay | Admitting: Nurse Practitioner

## 2017-10-28 ENCOUNTER — Non-Acute Institutional Stay (SKILLED_NURSING_FACILITY): Payer: Medicare Other | Admitting: Nurse Practitioner

## 2017-10-28 ENCOUNTER — Ambulatory Visit (INDEPENDENT_AMBULATORY_CARE_PROVIDER_SITE_OTHER): Payer: Medicare Other | Admitting: Interventional Cardiology

## 2017-10-28 VITALS — BP 130/64 | HR 72 | Ht 66.0 in | Wt 150.0 lb

## 2017-10-28 DIAGNOSIS — I5032 Chronic diastolic (congestive) heart failure: Secondary | ICD-10-CM | POA: Diagnosis not present

## 2017-10-28 DIAGNOSIS — F325 Major depressive disorder, single episode, in full remission: Secondary | ICD-10-CM | POA: Diagnosis not present

## 2017-10-28 DIAGNOSIS — R0902 Hypoxemia: Secondary | ICD-10-CM

## 2017-10-28 DIAGNOSIS — R319 Hematuria, unspecified: Secondary | ICD-10-CM

## 2017-10-28 DIAGNOSIS — I25700 Atherosclerosis of coronary artery bypass graft(s), unspecified, with unstable angina pectoris: Secondary | ICD-10-CM | POA: Diagnosis not present

## 2017-10-28 DIAGNOSIS — C3412 Malignant neoplasm of upper lobe, left bronchus or lung: Secondary | ICD-10-CM | POA: Diagnosis not present

## 2017-10-28 DIAGNOSIS — J90 Pleural effusion, not elsewhere classified: Secondary | ICD-10-CM | POA: Diagnosis not present

## 2017-10-28 DIAGNOSIS — N183 Chronic kidney disease, stage 3 unspecified: Secondary | ICD-10-CM

## 2017-10-28 DIAGNOSIS — I25118 Atherosclerotic heart disease of native coronary artery with other forms of angina pectoris: Secondary | ICD-10-CM | POA: Diagnosis not present

## 2017-10-28 DIAGNOSIS — D509 Iron deficiency anemia, unspecified: Secondary | ICD-10-CM

## 2017-10-28 DIAGNOSIS — I214 Non-ST elevation (NSTEMI) myocardial infarction: Secondary | ICD-10-CM

## 2017-10-28 DIAGNOSIS — K219 Gastro-esophageal reflux disease without esophagitis: Secondary | ICD-10-CM

## 2017-10-28 DIAGNOSIS — I10 Essential (primary) hypertension: Secondary | ICD-10-CM

## 2017-10-28 NOTE — Assessment & Plan Note (Addendum)
F/u oncology, s/p thoracentesis, continue Breo Ellipta daily, prn Albuterol HFA.

## 2017-10-28 NOTE — Assessment & Plan Note (Signed)
S/p CABG, 3x non STEMI in history, continue Imdur, prn NTG, Plavix.

## 2017-10-28 NOTE — Assessment & Plan Note (Signed)
Controlled.  

## 2017-10-28 NOTE — Patient Instructions (Addendum)
Medication Instructions:   STOP TAKING ASPIRIN 81 MG ONCE A DAY   If you need a refill on your cardiac medications before your next appointment, please call your pharmacy.  Labwork: NONE ORDERED  TODAY   Testing/Procedures: NONE ORDERED  TODAY\   Follow-Up: AS SCHEDULED WITH DR Irish Lack  MAY 2020   Any Other Special Instructions Will Be Listed Below (If Applicable).

## 2017-10-28 NOTE — Assessment & Plan Note (Signed)
Mood is stable, continue Desvenlafaxine 25mg  qd, Wellbutrin 150mg  qd. Observe.

## 2017-10-28 NOTE — Assessment & Plan Note (Signed)
Stable, continue Protonix 40mg  bid.

## 2017-10-28 NOTE — Assessment & Plan Note (Addendum)
Stopped ASA 10/28/17 at Cardiology, will continue to monitor for s/s of bleeding, update CBC/diff to evaluate blood loss and sing of infection.  10/31/17 [place] mass like focus in the urinary bladder, blood clots vs a mass.  11/01/17 Urin culture showed no growth.  11/04/17 wbc 5.7, Hgb 10.9, plt 114, neutrophils 66.4$ The patient has no hematuria x about 48 hours, hemodynamically stable, may resume original planning of discharge home [place] with comfort keeper. He needs to see Urology as outpatient.

## 2017-10-28 NOTE — Assessment & Plan Note (Signed)
Compensated clinically, observe.

## 2017-10-28 NOTE — Progress Notes (Signed)
10/28/2017 Scott [name]   1923-10-02  [id number]  Primary Physician [name], MD Primary Cardiologist: Dr. Irish Lack   Reason for Visit/CC: [facility] f/u for NSTEMI  HPI:  Scott [name] is a 84 y.o. male who is being seen today for post hospital f/u for NSTEMI.   His PMH is notable for CAD status post CABG in 59 in [place], [place]. He was admitted with a non-STEMI in 2007 and grafts were patent at that time but there was distal disease in the circumflex and is treated medically. He EF was 40-45% that time. He returned with a non-STEMI on 10/13/14 and underwent cardiac catheterization by Dr. [name]. He had a patent LIMA to the LAD and diagonal branch and patent dominant RCA. He had occluded vein grafts. Medical therapy was recommended. Toprol was stopped due to bradycardia.   He also has metastatic lung cancer (adenocarcinoma) on immunotherapy, recurrent malignant left-sided pleural effusion requiring thoracentesis and stage III chronic kidney disease. He is on chronic home O2.   He presented to [facility] with complaints of shortness of breath with significant wheezing.  He denied chest pain but had endorsed bilateral shoulder pain.  He was found to have an elevated troponin of 5.44.  In the context of comorbidities including metastatic lung cancer, recurrent pleural effusions and worsening renal function, it was felt best to treat his non-STEMI conservatively.  He was treated medically. Not a candidate for cath.  Palliative care was consulted to discuss goals of care. The patient and his family both agreed to gentle medical treatment.  Code status changed to DNR .  He opted not to go to skilled nursing facility as advised and decided to go back to assisted living facility with hospice services.  He was discharged home on aspirin 81 mg, Plavix 75 mg, atorvastatin 40 mg and Imdur 30 mg.   He is here in clinic today for follow-up.  Her with his daughter and his  granddaughter.  From a cardiac standpoint, he has been doing fairly well.  He denies any recurrent chest pain.  No significant dyspnea.  He does have mild dyspnea at baseline and remains on supplemental oxygen however no increased demands.  His blood pressure and heart rate are both well controlled with medications.  His chief complaint today however is new development of hematuria which was noted this morning while urinating.  He notes this is his first ever occurrence.  He denies any associated pain.  No other abnormal bleeding.   No outpatient medications have been marked as taking for the 10/28/17 encounter (Office Visit) with Consuelo Pandy, PA-C.   No Known Allergies Past Medical History:  Diagnosis Date  . Anxiety   . Bradycardia    a. BB d/c'd in 2016.  [name] CAD (coronary artery disease)    a. 1983 s/p CABG [facility], [place]);  b. 2007 NSTEMI->med Rx for distal LCX dzs; c. 09/2014 NSTEMI/Cath: LIMA->LAD & diag ok, native RCA dominant and patent.   VG->RCA 100, VG->LCX 100 -->Med Rx.  . CKD (chronic kidney disease), stage III ([place])   . Claudication ([place])    BLE  . Depression   . Diastolic dysfunction    a. 10/2014 Echo: F 60-65%, Gr1 DD, mild AS, mild AI, mild to mod MR, PASP 61mmHg.  [name] Dyspnea 2010   syndrome - extensive  . Erectile dysfunction   . GERD (gastroesophageal reflux disease)   . H/O hiatal hernia   . History of blood transfusion  2014  . Hx of adenomatous colonic polyps 2007  . Hyperlipidemia   . Hypertensive heart disease   . Inguinal hernia   . Iron deficiency anemia   . Lung cancer ([place])   . Shingles 1990's  . Sigmoid diverticulosis   . Upper GI bleeding 2014   Gastritis and possible duodenal ulcer seen on EGD, felt secondary to NSAIDs  . Urethral stricture 11/2008  . Vasovagal syncope 05/2006  . Weight loss    Family History  Problem Relation Age of Onset  . Hypertension Mother   . Heart disease Mother   . Heart disease Father   . Heart failure Brother     . Heart failure Sister   . Heart failure Brother   . Heart attack Neg Hx   . Stroke Neg Hx    Past Surgical History:  Procedure Laterality Date  . CARDIAC CATHETERIZATION  2007   Left main 100%, RCA 70% ostial, all grafts were patent, EF 40-45 percent, distal circumflex subtotal after graft insertion, medical therapy   . CARDIAC CATHETERIZATION N/A 10/14/2014   Procedure: Left Heart Cath and Cors/Grafts Angiography;  Surgeon: Lorretta Harp, MD;  Location: [facility];  Service: Cardiovascular;  Laterality: N/A;  . CHOLECYSTECTOMY  1980's  . CORONARY ARTERY BYPASS GRAFT  1994    LIMA-LAD, SVG-D1, SVG-OM,  SVG-RCA; performed in [place], [place]  . ESOPHAGOGASTRODUODENOSCOPY N/A 03/25/2013   Procedure: ESOPHAGOGASTRODUODENOSCOPY (EGD);  Surgeon: Missy Sabins, MD;  gastritis, cannot rule out duodenal ulcer   . INGUINAL HERNIA REPAIR Left   . IR THORACENTESIS ASP PLEURAL SPACE W/IMG GUIDE  10/07/2017  . TRANSURETHRAL RESECTION OF PROSTATE     Social History   Socioeconomic History  . Marital status: Married    Spouse name: Not on file  . Number of children: Not on file  . Years of education: Not on file  . Highest education level: Not on file  Occupational History  . Occupation: Retired  Scientific laboratory technician  . Financial resource strain: Not hard at all  . Food insecurity:    Worry: Never true    Inability: Never true  . Transportation needs:    Medical: No    Non-medical: No  Tobacco Use  . Smoking status: Never Smoker  . Smokeless tobacco: Never Used  Substance and Sexual Activity  . Alcohol use: Not Currently    Alcohol/week: 4.2 oz    Types: 7 Glasses of wine per week    Comment: wine - daily - currently not drinking  . Drug use: No  . Sexual activity: Never    Birth control/protection: Abstinence  Lifestyle  . Physical activity:    Days per week: 0 days    Minutes per session: 0 min  . Stress: Not at all  Relationships  . Social connections:    Talks on  phone: More than three times a week    Gets together: More than three times a week    Attends religious service: Never    Active member of club or organization: No    Attends meetings of clubs or organizations: Never    Relationship status: Married  . Intimate partner violence:    Fear of current or ex partner: No    Emotionally abused: No    Physically abused: No    Forced sexual activity: No  Other Topics Concern  . Not on file  Social History Narrative   He lives in [facility], independent living, with his wife.  Review of Systems: General: negative for chills, fever, night sweats or weight changes.  Cardiovascular: negative for chest pain, dyspnea on exertion, edema, orthopnea, palpitations, paroxysmal nocturnal dyspnea or shortness of breath Dermatological: negative for rash Respiratory: negative for cough or wheezing Urologic: negative for hematuria Abdominal: negative for nausea, vomiting, diarrhea, bright red blood per rectum, melena, or hematemesis Neurologic: negative for visual changes, syncope, or dizziness All other systems reviewed and are otherwise negative except as noted above.   Physical Exam:  Blood pressure 130/64, pulse 72, height 5\' 6"  (1.676 m), weight 150 lb (68 kg), SpO2 98 %.  General appearance: alert, cooperative, no distress and elderly, frail appearing male Neck: no carotid bruit and no JVD Lungs: decreased BS bilaterally Heart: regular rate and rhythm, S1, S2 normal, no murmur, click, rub or gallop Extremities: trace bilateral LE edema Pulses: 2+ and symmetric Skin: Skin color, texture, turgor normal. No rashes or lesions Neurologic: Grossly normal  EKG not performed -- personally reviewed   ASSESSMENT AND PLAN:   1. CAD: history of coronary artery bypass grafting in the 1980s in [place].  His last cardiac catheterization for non-STEMI in 2016 showed patent LIMA to the LAD and diagonal branch and patent dominant RCA. He had  occluded vein grafts, which were treated medically.  He recently admitted May 2019 for another non-STEMI with troponin peaking at 5.44.  However, in the context of comorbidities including metastatic lung cancer, recurrent pleural effusions and worsening renal function, it was felt best to treat his non-STEMI conservatively.  He was treated medically. Not a candidate for cath.  Palliative care was consulted to discuss goals of care. Plan is for comfort care. He is DNR. He is currently CP free on medical therapy. We will continue Plavix, atorvastatin and Imdur. We will d/c ASA due to hematuria. No BB due to h/o bradycardia.   2. Hematuria: First documented occurrence noted today, in the setting of DAPT w/ ASA and Plavix. I've discussed with primary cardiologist, Dr. Irish Lack. We will d/c ASA and will continue Plavix. If he has recurrent hematuria, despite discontinuation of ASA, he will notify care team at nursing home and they can f/u with CBC to ensure no significant drop in H/H.   3. Metastatic Lung Cancer: (adenocarcinoma) on immunotherapy, recurrent malignant left-sided pleural effusion requiring thoracentesis. He is on chronic home O2 and is being followed by palliative care.   Follow-Up: recall in place to f/u with Dr. Irish Lack 09/2018   Juergen Hardenbrook Rosita Fire PA-C, [facility] 10/28/2017 11:02 AM   I have examined the patient and reviewed assessment and plan and discussed with patient.  Agree with above as stated.  Doing better after recent NSTEMI.  Had some hematuria.  Exam significant for decreased breath souls on the left, likely related to pleural effusion.    1) Stop aspirin given hematuria.  Will continue clopidogrel.  If he has persistent hematuria, he will need a CBC.  This can be done at his facility.    2) Palliative care for cancer.  Pleural effusion may need drainage if he has sx.   Larae Grooms

## 2017-10-28 NOTE — Progress Notes (Addendum)
Location:  [place] Room Number: 17 Place of Service:  SNF (31)  Provider:Mahmood Boehringer, ManXie  NP  PCP: [name], MD Patient Care Team: [name], MD as PCP - General (Internal Medicine) Jettie Booze, MD as PCP - Cardiology (Cardiology) Deandrae Wajda X, NP as Nurse Practitioner (Internal Medicine) Lady Gary, [facility] as Nurse Practitioner [facility] and Palliative Medicine)  Extended Emergency Contact Information Primary Emergency Contact: [facility] Address: [address]          [place], [place] [address] Johnnette Litter of [place] Phone: [phone] Relation: Spouse Secondary Emergency Contact: Caudle,[name] Address: [address], [place] [address] Johnnette Litter of [place] Phone: [phone] Work Phone: [phone] Mobile Phone: [phone] Relation: Daughter  Code Status: DNR Goals of care:  Advanced Directive information Advanced Directives 10/28/2017  Does Patient Have a Medical Advance Directive? Yes  Type of Paramedic of [place];Out of facility DNR (pink MOST or yellow form)  Does patient want to make changes to medical advance directive? No - Patient declined  Copy of [place] in Chart? Yes  Would patient like information on creating a medical advance directive? -  Pre-existing out of facility DNR order (yellow form or pink MOST form) Yellow form placed in chart (order not valid for inpatient use)     No Known Allergies  Chief Complaint  Patient presents with  . Discharge Note    resident to return to apartment    HPI:  84 y.o. male was admitted to SNF [facility] 10/08/17 following hospitalization 10/05/08-10/08/17 for non STEMI, conservative management per Cardiology. On ASA, Imdur, Atorvastatin. F/u Cardiology today: CAD s/p CABG 1983. No STEMI 2007, treated medically. Non STEMI 10/13/14, medical treatment. Hx off metastatic lung  cancer, taking Breo Ellipta daily, prn Albuterol HFA, left sided pleural effusion requiring thoracentesis on Tagrisso. Hx of CKD with creat 2s. Hematuria today at Cardiology's visit, stopped ASA, continue Plavix. F/u CBC and hematuria is warranted.    The patient has no hematuria x about 48 hours, Plavix on hold with approval of Cardiology for now. Needs f/u Cardiology for further recommendation of Plavix use.    Hx of anemia, on Vit B12, Fe,  GERD stable on Protonix 40mg  bid. Depression, stable on Desvenlafaxine 25mg  qd, Wellbutrin 150mg  qd.     The patient has regained physical strength, underwent palliative evaluation, he desires to return home [place] with his wife. Medically he is stable, may discharge home  if hematuria improves. He is going to have Production assistant, radio in home to assist with medication reminders and ADLs.    Past Medical History:  Diagnosis Date  . Anxiety   . Bradycardia    a. BB d/c'd in 2016.  [name] CAD (coronary artery disease)    a. 1983 s/p CABG [facility], [place]);  b. 2007 NSTEMI->med Rx for distal LCX dzs; c. 09/2014 NSTEMI/Cath: LIMA->LAD & diag ok, native RCA dominant and patent.   VG->RCA 100, VG->LCX 100 -->Med Rx.  . CKD (chronic kidney disease), stage III ([place])   . Claudication ([place])    BLE  . Depression   . Diastolic dysfunction    a. 10/2014 Echo: F 60-65%, Gr1 DD, mild AS, mild AI, mild to mod MR, PASP 42mmHg.  [name] Dyspnea 2010   syndrome - extensive  . Erectile dysfunction   . GERD (gastroesophageal reflux  disease)   . H/O hiatal hernia   . History of blood transfusion 2014  . Hx of adenomatous colonic polyps 2007  . Hyperlipidemia   . Hypertensive heart disease   . Inguinal hernia   . Iron deficiency anemia   . Lung cancer ([place])   . Shingles 1990's  . Sigmoid diverticulosis   . Upper GI bleeding 2014   Gastritis and possible duodenal ulcer seen on EGD, felt secondary to NSAIDs  . Urethral stricture 11/2008  . Vasovagal syncope 05/2006  . Weight loss      Past Surgical History:  Procedure Laterality Date  . CARDIAC CATHETERIZATION  2007   Left main 100%, RCA 70% ostial, all grafts were patent, EF 40-45 percent, distal circumflex subtotal after graft insertion, medical therapy   . CARDIAC CATHETERIZATION N/A 10/14/2014   Procedure: Left Heart Cath and Cors/Grafts Angiography;  Surgeon: Lorretta Harp, MD;  Location: [facility] CV LAB;  Service: Cardiovascular;  Laterality: N/A;  . CHOLECYSTECTOMY  1980's  . CORONARY ARTERY BYPASS GRAFT  1994    LIMA-LAD, SVG-D1, SVG-OM,  SVG-RCA; performed in [place], [place]  . ESOPHAGOGASTRODUODENOSCOPY N/A 03/25/2013   Procedure: ESOPHAGOGASTRODUODENOSCOPY (EGD);  Surgeon: Missy Sabins, MD;  gastritis, cannot rule out duodenal ulcer   . INGUINAL HERNIA REPAIR Left   . IR THORACENTESIS ASP PLEURAL SPACE W/IMG GUIDE  10/07/2017  . TRANSURETHRAL RESECTION OF PROSTATE        reports that he has never smoked. He has never used smokeless tobacco. He reports that he drank about 4.2 oz of alcohol per week. He reports that he does not use drugs. Social History   Socioeconomic History  . Marital status: Married    Spouse name: Not on file  . Number of children: Not on file  . Years of education: Not on file  . Highest education level: Not on file  Occupational History  . Occupation: Retired  Scientific laboratory technician  . Financial resource strain: Not hard at all  . Food insecurity:    Worry: Never true    Inability: Never true  . Transportation needs:    Medical: No    Non-medical: No  Tobacco Use  . Smoking status: Never Smoker  . Smokeless tobacco: Never Used  Substance and Sexual Activity  . Alcohol use: Not Currently    Alcohol/week: 4.2 oz    Types: 7 Glasses of wine per week    Comment: wine - daily - currently not drinking  . Drug use: No  . Sexual activity: Never    Birth control/protection: Abstinence  Lifestyle  . Physical activity:    Days per week: 0 days    Minutes per session: 0  min  . Stress: Not at all  Relationships  . Social connections:    Talks on phone: More than three times a week    Gets together: More than three times a week    Attends religious service: Never    Active member of club or organization: No    Attends meetings of clubs or organizations: Never    Relationship status: Married  . Intimate partner violence:    Fear of current or ex partner: No    Emotionally abused: No    Physically abused: No    Forced sexual activity: No  Other Topics Concern  . Not on file  Social History Narrative   He lives in [facility], independent living, with his wife.   Functional Status Survey:    No  Known Allergies  Pertinent  Health Maintenance Due  Topic Date Due  . INFLUENZA VACCINE  12/18/2017  . PNA vac Low Risk Adult (2 of 2 - PCV13) 05/20/2018    Medications: Outpatient Encounter Medications as of 10/28/2017  Medication Sig  . acetaminophen (TYLENOL) 500 MG tablet Take 500 mg by mouth every 6 (six) hours as needed for mild pain or moderate pain.  [name] albuterol (PROAIR HFA) 108 (90 Base) MCG/ACT inhaler Inhale 2 puffs into the lungs every 6 (six) hours as needed for wheezing or shortness of breath.  [name] atorvastatin (LIPITOR) 40 MG tablet Take 1 tablet (40 mg total) by mouth daily at 6 PM.  . BREO ELLIPTA 100-25 MCG/INH AEPB Inhale 1 puff into the lungs daily.  [name] buPROPion (WELLBUTRIN SR) 150 MG 12 hr tablet Take 150 mg by mouth daily.  . clopidogrel (PLAVIX) 75 MG tablet Take 1 tablet (75 mg total) by mouth daily.  [name] Desvenlafaxine Succinate ER 25 MG TB24 Take 25 mg by mouth daily.  [name] FERROCITE 324 MG TABS tablet TAKE 1 TABLET BY MOUTH QOD  . isosorbide mononitrate (IMDUR) 30 MG 24 hr tablet Take 30 mg by mouth daily.  . nitroGLYCERIN (NITROSTAT) 0.4 MG SL tablet Place 1 tablet (0.4 mg total) under the tongue every 5 (five) minutes x 3 doses as needed for chest pain.  . Omega-3 Fatty Acids (FISH OIL PO) Take 1 capsule by mouth daily.  [name]  osimertinib mesylate (TAGRISSO) 80 MG tablet TAKE 1 TABLET (80 MG TOTAL) BY MOUTH DAILY.  [name] OXYGEN Inhale 2 L into the lungs continuous.  . pantoprazole (PROTONIX) 40 MG tablet Take 1 tablet (40 mg total) by mouth 2 (two) times daily before a meal.  . vitamin B-12 (CYANOCOBALAMIN) 500 MCG tablet Take 500 mcg by mouth daily. complex  . [DISCONTINUED] aspirin EC 81 MG EC tablet Take 1 tablet (81 mg total) by mouth daily.  . [DISCONTINUED] pregabalin (LYRICA) 50 MG capsule Take 50 mg by mouth as needed.    No facility-administered encounter medications on file as of 10/28/2017.     Review of Systems  Constitutional: Positive for fatigue. Negative for activity change, appetite change, chills, diaphoresis and fever.  HENT: Positive for hearing loss. Negative for congestion, trouble swallowing and voice change.   Respiratory: Positive for shortness of breath. Negative for cough, chest tightness and wheezing.   Cardiovascular: Negative for chest pain, palpitations and leg swelling.  Gastrointestinal: Negative for abdominal distention, abdominal pain, anal bleeding, blood in stool, constipation, diarrhea, nausea and vomiting.  Genitourinary: Positive for hematuria. Negative for difficulty urinating, discharge, dysuria, frequency, penile pain, penile swelling, scrotal swelling, testicular pain and urgency.  Musculoskeletal: Positive for arthralgias and gait problem. Negative for joint swelling.  Neurological: Negative for dizziness, speech difficulty, weakness and headaches.       Memory lapses.   Psychiatric/Behavioral: Negative for agitation, behavioral problems, confusion, hallucinations and sleep disturbance. The patient is not nervous/anxious.     Vitals:   10/28/17 1008  BP: 130/60  Pulse: 68  Resp: 20  Temp: 97.9 F (36.6 C)  SpO2: 97%  Weight: 148 lb 14.4 oz (67.5 kg)  Height: 5\' 6"  (1.676 m)   Body mass index is 24.03 kg/m. Physical Exam  Constitutional: He appears well-developed  and well-nourished.  HENT:  Head: Normocephalic and atraumatic.  Eyes: Pupils are equal, round, and reactive to light. EOM are normal. No scleral icterus.  Neck: Normal range of motion. Neck supple. No JVD present.  No thyromegaly present.  Cardiovascular: Normal rate and regular rhythm.  Murmur heard. Irregular heart beats.   Pulmonary/Chest: Effort normal and breath sounds normal. He has no wheezes. He has no rales.  Decreased air entry.   Abdominal: Soft. Bowel sounds are normal. He exhibits no distension. There is no tenderness. There is no rebound and no guarding.  Musculoskeletal: He exhibits no edema.  Self transfer, ambulates with walker.   Neurological: He is alert. No cranial nerve deficit. He exhibits normal muscle tone. Coordination normal.  Oriented to person and place.   Skin: Skin is warm and dry. No pallor.  Psychiatric: He has a normal mood and affect. His behavior is normal. Judgment and thought content normal.    Labs reviewed: Basic Metabolic Panel: Recent Labs    05/23/17 0947 05/23/17 0948 06/20/17 1054  08/01/17 1106  10/06/17 0240 10/07/17 0351 10/14/17 10/20/17 0842 10/21/17  NA  --  140 140   < > 142   < > 140 141 140 141 141  K  --  5.0 5.4*   < > 4.5   < > 4.3 4.4 4.3 4.5 4.1  CL  --   --  107   < > 109   < > 108 108 108 106 108  CO2  --  24 26   < > 25   < > 22 22 23 26 25   GLUCOSE  --  81 104  --  65*   < > 90 96  --  88  --   BUN  --  41.7* 35*   < > 26   < > 28* 33* 33* 26 29*  CREATININE  --  2.5* 2.30*   < > 1.69*   < > 1.85* 1.77* 1.8* 1.92* 1.8*  CALCIUM  --  9.2 9.3   < > 9.3   < > 8.8* 9.2 8.8 9.4 9.0  MG  --  3.2* 3.0*  --  2.4  --   --   --   --   --   --   PHOS 3.7  --  3.4  --   --   --   --   --   --   --   --    < > = values in this interval not displayed.   Liver Function Tests: Recent Labs    09/03/17 1423 09/15/17 0924 10/20/17 0842 10/21/17  AST 17 31 21 17   ALT 17 28 20 17   ALKPHOS 57 75 78 67  BILITOT 0.3 0.2 0.4  --    PROT 6.3* 7.0 6.5  --   ALBUMIN 3.2* 3.3* 3.3* 3.4   Recent Labs    04/09/17 1352  LIPASE 48   No results for input(s): AMMONIA in the last 8760 hours. CBC: Recent Labs    09/03/17 1423 09/15/17 0924  10/07/17 0351 10/08/17 0321 10/14/17 10/20/17 0842 10/21/17  WBC 7.7 5.8   < > 7.2 7.5 6.7 7.2 6.4  NEUTROABS 5.7 4.0  --   --   --   --  5.2  --   HGB 12.8* 12.1*   < > 12.2* 11.5* 12.0* 12.5* 11.8*  HCT 41.0 38.5   < > 39.4 36.9* 37* 39.8 36*  MCV 90.3 89.5   < > 88.5 88.3  --  88.4  --   PLT 126* 153   < > 169 157 166 141 159   < > = values in this interval not displayed.  Cardiac Enzymes: Recent Labs    10/05/17 1521 10/05/17 2035 10/06/17 0240  TROPONINI 10.07* 10.29* 8.99*   BNP: Invalid input(s): POCBNP CBG: Recent Labs    03/17/17 0821  GLUCAP 89    Procedures and Imaging Studies During Stay: Dg Chest 1 View  Result Date: 10/07/2017 CLINICAL DATA:  Status post left-sided thoracentesis. EXAM: CHEST  1 VIEW COMPARISON:  Radiographs of Oct 05, 2017. FINDINGS: Stable cardiomegaly. Sternotomy wires are noted. No pneumothorax is noted. Left pleural effusion is significantly smaller status post thoracentesis. Mild bibasilar subsegmental atelectasis and residual fusions are noted. Bony thorax is unremarkable. IMPRESSION: Left pleural effusion is significantly smaller status post left-sided thoracentesis. No pneumothorax is noted. Electronically Signed   By: Marijo Conception, M.D.   On: 10/07/2017 16:13   Ct Chest Wo Contrast  Result Date: 10/14/2017 CLINICAL DATA:  Metastatic lung cancer (adenocarcinoma) post immunotherapy. EXAM: CT CHEST WITHOUT CONTRAST TECHNIQUE: Multidetector CT imaging of the chest was performed following the standard protocol without IV contrast. COMPARISON:  Portable chest 10/07/2017. Chest CT 08/01/2017. PET-CT 03/17/2017. FINDINGS: Cardiovascular: Atherosclerosis of aorta, great vessels and coronary arteries status post median sternotomy and  CABG. The heart size is normal. There is no pericardial effusion. Mediastinum/Nodes: There are no enlarged mediastinal, hilar or axillary lymph nodes.There are calcified left hilar lymph nodes. There is a large hiatal hernia. Low-density nodule involving the lower pole of the right thyroid lobe is not as well seen. There is a stable small calcification inferiorly in the left thyroid lobe. Lungs/Pleura: Moderate size left pleural effusion has decreased in volume. There is minimal pleural fluid on the right. There has been interval partial re-expansion of the left upper and lower lobes. Left upper lobe lesion which was hypermetabolic on prior PET-CT measures approximately 1.8 x 1.6 cm on image 26/7, partly obscured by atelectasis on the most recent study. There are scattered small right lung nodules. No new or enlarging nodules identified. Upper abdomen: The visualized upper abdomen appears stable without suspicious findings. There is a stable 17 mm right adrenal nodule on image 134/2. Musculoskeletal/Chest wall: There is no chest wall mass or suspicious osseous finding. Stable multilevel spondylosis. IMPRESSION: 1. Interval decreased size of left pleural effusion, now moderate in size. There is associated improved aeration of the left lung. 2. The hypermetabolic left upper lobe lesion has decreased in size from the PET-CT of 03/17/2017. Scattered small right lung nodules are stable. No evidence of disease progression. 3. Sequela of prior granulomatous disease. 4. Large hiatal hernia. 5.  Aortic Atherosclerosis (ICD10-I70.0). Electronically Signed   By: Richardean Sale M.D.   On: 10/14/2017 13:31   Ir Thoracentesis Asp Pleural Space W/img Guide  Result Date: 10/07/2017 INDICATION: Patient with recurrent left pleural effusion. Request is made for therapeutic thoracentesis. EXAM: ULTRASOUND GUIDED THERAPEUTIC THORACENTESIS MEDICATIONS: 10 mL 2% lidocaine COMPLICATIONS: None immediate. PROCEDURE: An ultrasound guided  thoracentesis was thoroughly discussed with the patient and questions answered. The benefits, risks, alternatives and complications were also discussed. The patient understands and wishes to proceed with the procedure. Written consent was obtained. Ultrasound was performed to localize and mark an adequate pocket of fluid in the left chest. The area was then prepped and draped in the normal sterile fashion. 2% lidocaine was used for local anesthesia. Under ultrasound guidance a 6 Fr Safe-T-Centesis catheter was introduced. Thoracentesis was performed. The catheter was removed and a dressing applied. FINDINGS: A total of approximately 1.6 liters of yellow fluid was removed. Samples were sent to  the laboratory as requested by the clinical team. IMPRESSION: Successful ultrasound guided therapeutic left thoracentesis yielding 1.6 L of pleural fluid. Read by: Brynda Greathouse PA-C Follow-up chest radiograph shows no pneumothorax. Electronically Signed   By: Lucrezia Europe M.D.   On: 10/07/2017 16:07    Assessment/Plan:   Chronic diastolic CHF (congestive heart failure) ([place]) Compensated clinically, observe.   CAD (coronary artery disease) of artery bypass graft, ALL VGs occluded, patent LIMA -->LAD, patent dominant RCA S/p CABG, 3x non STEMI in history, continue Imdur, prn NTG, Plavix.   Essential hypertension Controlled.   Recurrent left pleural effusion F/u oncology, s/p thoracentesis, continue Breo Ellipta daily, prn Albuterol HFA.   GERD (gastroesophageal reflux disease) Stable, continue Protonix 40mg  bid.   CKD (chronic kidney disease), stage III (HCC) Creat near 2, observe.   Depression Mood is stable, continue Desvenlafaxine 25mg  qd, Wellbutrin 150mg  qd. Observe.   Anemia Continue Vit B12, Fe, f/u CBC  Hematuria Stopped ASA 10/28/17 at Cardiology, will continue to monitor for s/s of bleeding, update CBC/diff to evaluate blood loss and sing of infection.  10/31/17 [place] mass like focus in the  urinary bladder, blood clots vs a mass.  11/01/17 Urin culture showed no growth.  11/04/17 wbc 5.7, Hgb 10.9, plt 114, neutrophils 66.4$ The patient has no hematuria x about 48 hours, hemodynamically stable, may resume original planning of discharge home [place] with comfort keeper. He needs to see Urology as outpatient.    Hypoxemia Continue O2 at 2lpm via [place] to maintain Sat O2>88%. The patient has left lung cancer and left pleural effusion which is contributory.      Patient is being discharged with the following home health services for Oxygen therapy.   Patient is being discharged with the following durable medical equipment:    Patient has been advised to f/u with their PCP in 1-2 weeks to for a transitions of care visit.  Social services at their facility was responsible for arranging this appointment.  Pt was provided with adequate prescriptions of noncontrolled medications to reach the scheduled appointment .  For controlled substances, a limited supply was provided as appropriate for the individual patient.  If the pt normally receives these medications from a pain clinic or has a contract with another physician, these medications should be received from that clinic or physician only).    Future labs/tests needed:  CBC/diff 10/30/17 and 11/04/17

## 2017-10-28 NOTE — Assessment & Plan Note (Signed)
Creat near 2, observe.

## 2017-10-28 NOTE — Assessment & Plan Note (Signed)
Continue Vit B12, Fe, f/u CBC

## 2017-10-29 MED FILL — TAGRISSO 80 MG TABLET: 80 | 30 days supply | Qty: 30 | Fill #0

## 2017-10-29 NOTE — Assessment & Plan Note (Signed)
Continue O2 at 2lpm via [place] to maintain Sat O2>88%. The patient has left lung cancer and left pleural effusion which is contributory.

## 2017-10-30 ENCOUNTER — Encounter: Payer: Self-pay | Admitting: Nurse Practitioner

## 2017-10-30 ENCOUNTER — Non-Acute Institutional Stay (SKILLED_NURSING_FACILITY): Payer: Medicare Other | Admitting: Nurse Practitioner

## 2017-10-30 ENCOUNTER — Telehealth: Payer: Self-pay | Admitting: Interventional Cardiology

## 2017-10-30 DIAGNOSIS — N183 Chronic kidney disease, stage 3 (moderate): Secondary | ICD-10-CM | POA: Diagnosis not present

## 2017-10-30 DIAGNOSIS — R3911 Hesitancy of micturition: Secondary | ICD-10-CM | POA: Diagnosis not present

## 2017-10-30 DIAGNOSIS — R509 Fever, unspecified: Secondary | ICD-10-CM | POA: Diagnosis not present

## 2017-10-30 DIAGNOSIS — R319 Hematuria, unspecified: Secondary | ICD-10-CM

## 2017-10-30 DIAGNOSIS — D509 Iron deficiency anemia, unspecified: Secondary | ICD-10-CM | POA: Diagnosis not present

## 2017-10-30 DIAGNOSIS — R350 Frequency of micturition: Secondary | ICD-10-CM | POA: Diagnosis not present

## 2017-10-30 DIAGNOSIS — R309 Painful micturition, unspecified: Secondary | ICD-10-CM | POA: Diagnosis not present

## 2017-10-30 NOTE — Telephone Encounter (Signed)
OK to hold Plavix for now.  Does he have f/u with urology?

## 2017-10-30 NOTE — Telephone Encounter (Signed)
Called and spoke to [name], Therapist, art at [facility], and made him aware that Dr. Irish Lack said that it was okay to hold Plavix for now. Asked if patient has f/u with Urology. [name] states that he is not sure, but if he doesn't then they will be able to arrange there.

## 2017-10-30 NOTE — Telephone Encounter (Signed)
New message    RN from [facility] is calling to report blood in the urine. The NP there is asking if they can hold the plavix. Please call.

## 2017-10-30 NOTE — Telephone Encounter (Signed)
Scott [name], nursing supervisor calling back from [facility] and states that patient continues to have blood in his urine. He states that there a no visible clots but the urine is discolored.   Patient was seen by Lyda Jester, PA along with Dr. Irish Lack on 10/28/17 and ASA was d/c'd. Per OV note:   "We will d/c ASA and will continue Plavix. If he has recurrent hematuria, despite discontinuation of ASA, he will notify care team at nursing home and they can f/u with CBC to ensure no significant drop in H/H."  [name] states that they did a CBC today, and states that the hemoglobin was 10.9 which is down from last one on 6/4 which was 11.8. [name] states that the patient will be due for his plavix tonight and wants to know if he needs to hold it or not. Made [name] Aware that I would forward to Dr. Irish Lack for review and recommendation.

## 2017-10-30 NOTE — Telephone Encounter (Signed)
Attempted to return call to nurse Davanya from [facility]. I was transferred many times and then told by someone who did not give their name and whose English was limited that the nurse was not there and was told to call back later. I was then hung up on.

## 2017-10-30 NOTE — Progress Notes (Signed)
Location:  [place] Room Number: 17 Place of Service:  SNF (31) Provider:  Ghislaine Harcum, ManXie  NP  [name], MD  Patient Care Team: [name], MD as PCP - General (Internal Medicine) Jettie Booze, MD as PCP - Cardiology (Cardiology) Breslin Burklow X, NP as Nurse Practitioner (Internal Medicine) Lady Gary, [facility] as Nurse Practitioner [facility] and Palliative Medicine)  Extended Emergency Contact Information Primary Emergency Contact: [facility] Address: [address]          [place], [place] [address] Johnnette Litter of [place] Phone: [phone] Relation: Spouse Secondary Emergency Contact: Khanna,[name] Address: [address], [place] [address] Johnnette Litter of [place] Phone: [phone] Work Phone: [phone] Mobile Phone: [phone] Relation: Daughter  Code Status:  Full Code Goals of care: Advanced Directive information Advanced Directives 10/28/2017  Does Patient Have a Medical Advance Directive? Yes  Type of Paramedic of [place];Out of facility DNR (pink MOST or yellow form)  Does patient want to make changes to medical advance directive? No - Patient declined  Copy of [place] in Chart? Yes  Would patient like information on creating a medical advance directive? -  Pre-existing out of facility DNR order (yellow form or pink MOST form) Yellow form placed in chart (order not valid for inpatient use)     Chief Complaint  Patient presents with  . Acute Visit    Blood in Urine    HPI:  Pt is a 84 y.o. male seen today for an acute visit for hematuria, urinary hesitancy x 2 days, off ASA at cardiology, on Plavix,  Wbc 5.6, Hgb 10.9, plt 114, neutrophils 70%. He denied dysuria, abd or lower back pain, he is afebrile.    Past Medical History:  Diagnosis Date  . Anxiety   . Bradycardia    a. BB d/c'd in 2016.  [name] CAD (coronary  artery disease)    a. 1983 s/p CABG [facility] [place]);  b. 2007 NSTEMI->med Rx for distal LCX dzs; c. 09/2014 NSTEMI/Cath: LIMA->LAD & diag ok, native RCA dominant and patent.   VG->RCA 100, VG->LCX 100 -->Med Rx.  . CKD (chronic kidney disease), stage III ([place])   . Claudication ([place])    BLE  . Depression   . Diastolic dysfunction    a. 10/2014 Echo: F 60-65%, Gr1 DD, mild AS, mild AI, mild to mod MR, PASP 62mmHg.  [name] Dyspnea 2010   syndrome - extensive  . Erectile dysfunction   . GERD (gastroesophageal reflux disease)   . H/O hiatal hernia   . History of blood transfusion 2014  . Hx of adenomatous colonic polyps 2007  . Hyperlipidemia   . Hypertensive heart disease   . Inguinal hernia   . Iron deficiency anemia   . Lung cancer ([place])   . Shingles 1990's  . Sigmoid diverticulosis   . Upper GI bleeding 2014   Gastritis and possible duodenal ulcer seen on EGD, felt secondary to NSAIDs  . Urethral stricture 11/2008  . Vasovagal syncope 05/2006  . Weight loss    Past Surgical History:  Procedure Laterality Date  . CARDIAC CATHETERIZATION  2007   Left main 100%, RCA 70% ostial, all grafts were patent, EF 40-45 percent, distal circumflex subtotal after graft insertion, medical therapy   . CARDIAC CATHETERIZATION N/A 10/14/2014   Procedure: Left Heart Cath and  Cors/Grafts Angiography;  Surgeon: Lorretta Harp, MD;  Location: [place] CV LAB;  Service: Cardiovascular;  Laterality: N/A;  . CHOLECYSTECTOMY  1980's  . CORONARY ARTERY BYPASS GRAFT  1994    LIMA-LAD, SVG-D1, SVG-OM,  SVG-RCA; performed in [place], [place]  . ESOPHAGOGASTRODUODENOSCOPY N/A 03/25/2013   Procedure: ESOPHAGOGASTRODUODENOSCOPY (EGD);  Surgeon: Missy Sabins, MD;  gastritis, cannot rule out duodenal ulcer   . INGUINAL HERNIA REPAIR Left   . IR THORACENTESIS ASP PLEURAL SPACE W/IMG GUIDE  10/07/2017  . TRANSURETHRAL RESECTION OF PROSTATE      No Known Allergies  Outpatient Encounter Medications as of  10/30/2017  Medication Sig  . acetaminophen (TYLENOL) 500 MG tablet Take 500 mg by mouth every 6 (six) hours as needed for mild pain or moderate pain.  [name] albuterol (PROAIR HFA) 108 (90 Base) MCG/ACT inhaler Inhale 2 puffs into the lungs every 6 (six) hours as needed for wheezing or shortness of breath.  [name] atorvastatin (LIPITOR) 40 MG tablet Take 1 tablet (40 mg total) by mouth daily at 6 PM.  . BREO ELLIPTA 100-25 MCG/INH AEPB Inhale 1 puff into the lungs daily.  [name] buPROPion (WELLBUTRIN SR) 150 MG 12 hr tablet Take 150 mg by mouth daily.  . clopidogrel (PLAVIX) 75 MG tablet Take 1 tablet (75 mg total) by mouth daily.  [name] Desvenlafaxine Succinate ER 25 MG TB24 Take 25 mg by mouth daily.  [name] FERROCITE 324 MG TABS tablet TAKE 1 TABLET BY MOUTH QOD  . isosorbide mononitrate (IMDUR) 30 MG 24 hr tablet Take 30 mg by mouth daily.  . nitroGLYCERIN (NITROSTAT) 0.4 MG SL tablet Place 1 tablet (0.4 mg total) under the tongue every 5 (five) minutes x 3 doses as needed for chest pain.  . Omega-3 Fatty Acids (FISH OIL PO) Take 1 capsule by mouth daily.  [name] osimertinib mesylate (TAGRISSO) 80 MG tablet TAKE 1 TABLET (80 MG TOTAL) BY MOUTH DAILY.  [name] OXYGEN Inhale 2 L into the lungs continuous.  . pantoprazole (PROTONIX) 40 MG tablet Take 1 tablet (40 mg total) by mouth 2 (two) times daily before a meal.  . vitamin B-12 (CYANOCOBALAMIN) 500 MCG tablet Take 500 mcg by mouth daily. complex   No facility-administered encounter medications on file as of 10/30/2017.    ROS was provided with assistance of staff Review of Systems  Constitutional: Positive for fatigue. Negative for activity change, appetite change, chills, diaphoresis and fever.  HENT: Positive for hearing loss. Negative for congestion.   Respiratory: Positive for cough and shortness of breath. Negative for wheezing.   Cardiovascular: Negative for chest pain, palpitations and leg swelling.  Gastrointestinal: Negative for abdominal distention, abdominal  pain, blood in stool, constipation, diarrhea, nausea and vomiting.  Genitourinary: Positive for difficulty urinating and hematuria. Negative for decreased urine volume, dysuria, flank pain, frequency, penile pain, testicular pain and urgency.  Musculoskeletal: Positive for gait problem.  Neurological: Negative for dizziness, speech difficulty, weakness and headaches.       Memory lapses.   Psychiatric/Behavioral: Negative for agitation and behavioral problems.    Immunization History  Administered Date(s) Administered  . Influenza-Unspecified 02/17/2013, 05/20/2017  . Pneumococcal-Unspecified 05/20/2017  . Tdap 07/11/2012   Pertinent  Health Maintenance Due  Topic Date Due  . INFLUENZA VACCINE  12/18/2017  . PNA vac Low Risk Adult (2 of 2 - PCV13) 05/20/2018   Fall Risk  09/25/2017 04/07/2017  Falls in the past year? Yes No  Number falls in past yr: 2 or more -  Injury with Fall? No -   Functional Status Survey:    Vitals:   10/30/17 1301  BP: 128/60  Pulse: 72  Resp: 20  Temp: 97.9 F (36.6 C)  SpO2: 98%  Weight: 150 lb 14.4 oz (68.4 kg)  Height: 5\' 6"  (1.676 m)   Body mass index is 24.36 kg/m. Physical Exam  Constitutional: He appears well-developed and well-nourished. No distress.  Cardiovascular: Normal rate.  Murmur heard. Abdominal: Soft. Bowel sounds are normal. He exhibits no distension. There is no tenderness. There is no rebound and no guarding.  Musculoskeletal: He exhibits no edema or tenderness.  Neurological: He is alert. No cranial nerve deficit. He exhibits normal muscle tone. Coordination normal.  Oriented to person and place.   Skin: Skin is warm and dry. He is not diaphoretic.  Psychiatric: He has a normal mood and affect. His behavior is normal.    Labs reviewed: Recent Labs    05/23/17 0947 05/23/17 0948 06/20/17 1054  08/01/17 1106  10/06/17 0240 10/07/17 0351 10/14/17 10/20/17 0842 10/21/17  NA  --  140 140   < > 142   < > 140 141 140  141 141  K  --  5.0 5.4*   < > 4.5   < > 4.3 4.4 4.3 4.5 4.1  CL  --   --  107   < > 109   < > 108 108 108 106 108  CO2  --  24 26   < > 25   < > 22 22 23 26 25   GLUCOSE  --  81 104  --  65*   < > 90 96  --  88  --   BUN  --  41.7* 35*   < > 26   < > 28* 33* 33* 26 29*  CREATININE  --  2.5* 2.30*   < > 1.69*   < > 1.85* 1.77* 1.8* 1.92* 1.8*  CALCIUM  --  9.2 9.3   < > 9.3   < > 8.8* 9.2 8.8 9.4 9.0  MG  --  3.2* 3.0*  --  2.4  --   --   --   --   --   --   PHOS 3.7  --  3.4  --   --   --   --   --   --   --   --    < > = values in this interval not displayed.   Recent Labs    09/03/17 1423 09/15/17 0924 10/20/17 0842 10/21/17  AST 17 31 21 17   ALT 17 28 20 17   ALKPHOS 57 75 78 67  BILITOT 0.3 0.2 0.4  --   PROT 6.3* 7.0 6.5  --   ALBUMIN 3.2* 3.3* 3.3* 3.4   Recent Labs    09/03/17 1423 09/15/17 0924  10/07/17 0351 10/08/17 0321 10/14/17 10/20/17 0842 10/21/17  WBC 7.7 5.8   < > 7.2 7.5 6.7 7.2 6.4  NEUTROABS 5.7 4.0  --   --   --   --  5.2  --   HGB 12.8* 12.1*   < > 12.2* 11.5* 12.0* 12.5* 11.8*  HCT 41.0 38.5   < > 39.4 36.9* 37* 39.8 36*  MCV 90.3 89.5   < > 88.5 88.3  --  88.4  --   PLT 126* 153   < > 169 157 166 141 159   < > = values in this interval not displayed.  Lab Results  Component Value Date   TSH 2.530 08/14/2017   No results found for: HGBA1C Lab Results  Component Value Date   CHOL 175 10/14/2014   HDL 39 (L) 10/14/2014   LDLCALC 114 (H) 10/14/2014   TRIG 108 10/14/2014   CHOLHDL 4.5 10/14/2014    Significant Diagnostic Results in last 30 days:  Dg Chest 1 View  Result Date: 10/07/2017 CLINICAL DATA:  Status post left-sided thoracentesis. EXAM: CHEST  1 VIEW COMPARISON:  Radiographs of Oct 05, 2017. FINDINGS: Stable cardiomegaly. Sternotomy wires are noted. No pneumothorax is noted. Left pleural effusion is significantly smaller status post thoracentesis. Mild bibasilar subsegmental atelectasis and residual fusions are noted. Bony thorax is  unremarkable. IMPRESSION: Left pleural effusion is significantly smaller status post left-sided thoracentesis. No pneumothorax is noted. Electronically Signed   By: Marijo Conception, M.D.   On: 10/07/2017 16:13   Dg Chest 2 View  Result Date: 10/05/2017 CLINICAL DATA:  SOB and wheezing this AM; pt reports he was having pain but said he wasn't sure he could tell me where his pain was; former smoker EXAM: CHEST - 2 VIEW COMPARISON:  569 FINDINGS: Stable enlarged cardiac silhouette. Large hiatal hernia posterior to the heart. Increasing LEFT pleural effusion which extends along the LEFT lateral chest wall superiorly. Mild central venous congestion is increased. IMPRESSION: 1. Increasing LEFT effusion and central venous congestion. 2. Large hiatal hernia. Electronically Signed   By: Suzy Bouchard M.D.   On: 10/05/2017 10:05   Ct Chest Wo Contrast  Result Date: 10/14/2017 CLINICAL DATA:  Metastatic lung cancer (adenocarcinoma) post immunotherapy. EXAM: CT CHEST WITHOUT CONTRAST TECHNIQUE: Multidetector CT imaging of the chest was performed following the standard protocol without IV contrast. COMPARISON:  Portable chest 10/07/2017. Chest CT 08/01/2017. PET-CT 03/17/2017. FINDINGS: Cardiovascular: Atherosclerosis of aorta, great vessels and coronary arteries status post median sternotomy and CABG. The heart size is normal. There is no pericardial effusion. Mediastinum/Nodes: There are no enlarged mediastinal, hilar or axillary lymph nodes.There are calcified left hilar lymph nodes. There is a large hiatal hernia. Low-density nodule involving the lower pole of the right thyroid lobe is not as well seen. There is a stable small calcification inferiorly in the left thyroid lobe. Lungs/Pleura: Moderate size left pleural effusion has decreased in volume. There is minimal pleural fluid on the right. There has been interval partial re-expansion of the left upper and lower lobes. Left upper lobe lesion which was  hypermetabolic on prior PET-CT measures approximately 1.8 x 1.6 cm on image 26/7, partly obscured by atelectasis on the most recent study. There are scattered small right lung nodules. No new or enlarging nodules identified. Upper abdomen: The visualized upper abdomen appears stable without suspicious findings. There is a stable 17 mm right adrenal nodule on image 134/2. Musculoskeletal/Chest wall: There is no chest wall mass or suspicious osseous finding. Stable multilevel spondylosis. IMPRESSION: 1. Interval decreased size of left pleural effusion, now moderate in size. There is associated improved aeration of the left lung. 2. The hypermetabolic left upper lobe lesion has decreased in size from the PET-CT of 03/17/2017. Scattered small right lung nodules are stable. No evidence of disease progression. 3. Sequela of prior granulomatous disease. 4. Large hiatal hernia. 5.  Aortic Atherosclerosis (ICD10-I70.0). Electronically Signed   By: Richardean Sale M.D.   On: 10/14/2017 13:31   Ir Thoracentesis Asp Pleural Space W/img Guide  Result Date: 10/07/2017 INDICATION: Patient with recurrent left pleural effusion. Request is made for therapeutic  thoracentesis. EXAM: ULTRASOUND GUIDED THERAPEUTIC THORACENTESIS MEDICATIONS: 10 mL 2% lidocaine COMPLICATIONS: None immediate. PROCEDURE: An ultrasound guided thoracentesis was thoroughly discussed with the patient and questions answered. The benefits, risks, alternatives and complications were also discussed. The patient understands and wishes to proceed with the procedure. Written consent was obtained. Ultrasound was performed to localize and mark an adequate pocket of fluid in the left chest. The area was then prepped and draped in the normal sterile fashion. 2% lidocaine was used for local anesthesia. Under ultrasound guidance a 6 Fr Safe-T-Centesis catheter was introduced. Thoracentesis was performed. The catheter was removed and a dressing applied. FINDINGS: A total  of approximately 1.6 liters of yellow fluid was removed. Samples were sent to the laboratory as requested by the clinical team. IMPRESSION: Successful ultrasound guided therapeutic left thoracentesis yielding 1.6 L of pleural fluid. Read by: Brynda Greathouse PA-C Follow-up chest radiograph shows no pneumothorax. Electronically Signed   By: Lucrezia Europe M.D.   On: 10/07/2017 16:07    Assessment/Plan Hematuria Persists even off ASA, will hold Plavix if its okay with Cardiology, obtain UA C/S to r/o UTI. Monitor for bladder pattern, f/u CBC/diff 11/04/17. Referral to Urology for evaluation and treatment.   Urinary hesitancy Obtain bladder scan, observe.      Family/ staff Communication: plan of care reviewed with the patient, the patient's POA daughter, and charge nurse.  Delay discharge home. Urology referral.   Labs/tests ordered:  UA C/S, bladder scan, CBC/diff 11/04/17  Time spend 25 minutes.

## 2017-10-30 NOTE — Assessment & Plan Note (Addendum)
Persists even off ASA, will hold Plavix if its okay with Cardiology, obtain UA C/S to r/o UTI. Monitor for bladder pattern, f/u CBC/diff 11/04/17. Referral to Urology for evaluation and treatment.

## 2017-10-30 NOTE — Telephone Encounter (Signed)
Attempted to contact nurse Devanya from [facility] regarding the message below. I was transferred to a nurse's station voicemail. I left a message for Devanya to call back regarding the patient.

## 2017-10-30 NOTE — Assessment & Plan Note (Signed)
Obtain bladder scan, observe.

## 2017-10-31 ENCOUNTER — Encounter: Payer: Self-pay | Admitting: Nurse Practitioner

## 2017-10-31 DIAGNOSIS — R319 Hematuria, unspecified: Secondary | ICD-10-CM | POA: Diagnosis not present

## 2017-11-01 DIAGNOSIS — R319 Hematuria, unspecified: Secondary | ICD-10-CM | POA: Diagnosis not present

## 2017-11-03 NOTE — Telephone Encounter (Signed)
Per Dr. Irish Lack, patient will need U/A before urology referral. It looks like the patient is being seen by Man Mast, NP see note below from 10/30/17:  Assessment/Plan Hematuria Persists even off ASA, will hold Plavix if its okay with Cardiology, obtain UA C/S to r/o UTI. Monitor for bladder pattern, f/u CBC/diff 11/04/17. Referral to Urology for evaluation and treatment.   Urinary hesitancy Obtain bladder scan, observe.

## 2017-11-04 DIAGNOSIS — N183 Chronic kidney disease, stage 3 (moderate): Secondary | ICD-10-CM | POA: Diagnosis not present

## 2017-11-04 LAB — CBC AND DIFFERENTIAL
HEMATOCRIT: 33 — AB (ref 41–53)
HEMOGLOBIN: 10.9 — AB (ref 13.5–17.5)
Platelets: 114 — AB (ref 150–399)
WBC: 5.7

## 2017-11-04 NOTE — Telephone Encounter (Signed)
OK to hold Plavix if needed.

## 2017-11-05 ENCOUNTER — Other Ambulatory Visit: Payer: Self-pay | Admitting: *Deleted

## 2017-11-06 ENCOUNTER — Other Ambulatory Visit: Payer: Self-pay | Admitting: *Deleted

## 2017-11-06 MED ORDER — PANTOPRAZOLE SODIUM 40 MG PO TBEC: 40.0000 mg | DELAYED_RELEASE_TABLET | Freq: Two times a day (BID) | ORAL | 3 refills | Status: DC

## 2017-11-06 MED ORDER — VITAMIN B-12 500 MCG PO TABS: 500.0000 ug | ORAL_TABLET | Freq: Every day | ORAL | 2 refills | Status: DC

## 2017-11-06 MED ORDER — ISOSORBIDE MONONITRATE ER 30 MG PO TB24: 30.0000 mg | ORAL_TABLET | Freq: Every day | ORAL | 2 refills | Status: DC

## 2017-11-06 MED ORDER — ACETAMINOPHEN 500 MG PO TABS: 500.0000 mg | ORAL_TABLET | Freq: Four times a day (QID) | ORAL | 2 refills | Status: DC | PRN

## 2017-11-06 MED ORDER — ATORVASTATIN CALCIUM 40 MG PO TABS: 40.0000 mg | ORAL_TABLET | Freq: Every day | ORAL | 0 refills | Status: DC

## 2017-11-06 MED ORDER — DESVENLAFAXINE SUCCINATE ER 25 MG PO TB24: 25.0000 mg | ORAL_TABLET | Freq: Every day | ORAL | 2 refills | Status: DC

## 2017-11-06 MED ORDER — BREO ELLIPTA 100-25 MCG/INH IN AEPB: 1.0000 | INHALATION_SPRAY | Freq: Every day | RESPIRATORY_TRACT | 2 refills | Status: AC

## 2017-11-06 MED ORDER — NITROGLYCERIN 0.4 MG SL SUBL: 0.4000 mg | SUBLINGUAL_TABLET | SUBLINGUAL | 4 refills | Status: DC | PRN

## 2017-11-06 MED ORDER — ALBUTEROL SULFATE HFA 108 (90 BASE) MCG/ACT IN AERS: 2.0000 | INHALATION_SPRAY | Freq: Four times a day (QID) | RESPIRATORY_TRACT | 2 refills | Status: DC | PRN

## 2017-11-06 MED ORDER — FISH OIL 1000 MG PO CAPS: 1.0000 | ORAL_CAPSULE | Freq: Every day | ORAL | 0 refills | Status: DC

## 2017-11-06 MED ORDER — FERROCITE 324 MG PO TABS: 1.0000 | ORAL_TABLET | Freq: Every day | ORAL | 11 refills | Status: DC

## 2017-11-06 MED ORDER — BUPROPION HCL ER (SR) 150 MG PO TB12: 150.0000 mg | ORAL_TABLET | Freq: Every day | ORAL | 2 refills | Status: DC

## 2017-11-12 DIAGNOSIS — I10 Essential (primary) hypertension: Secondary | ICD-10-CM | POA: Diagnosis not present

## 2017-11-12 DIAGNOSIS — R278 Other lack of coordination: Secondary | ICD-10-CM | POA: Diagnosis not present

## 2017-11-12 DIAGNOSIS — I214 Non-ST elevation (NSTEMI) myocardial infarction: Secondary | ICD-10-CM | POA: Diagnosis not present

## 2017-11-12 DIAGNOSIS — C3412 Malignant neoplasm of upper lobe, left bronchus or lung: Secondary | ICD-10-CM | POA: Diagnosis not present

## 2017-11-13 ENCOUNTER — Telehealth: Payer: Self-pay | Admitting: *Deleted

## 2017-11-13 ENCOUNTER — Other Ambulatory Visit: Payer: Self-pay

## 2017-11-13 MED ORDER — FERROCITE 324 MG PO TABS: 1.0000 | ORAL_TABLET | Freq: Every day | ORAL | 2 refills | Status: DC

## 2017-11-13 MED ORDER — ATORVASTATIN CALCIUM 40 MG PO TABS: 40.0000 mg | ORAL_TABLET | Freq: Every day | ORAL | 2 refills | Status: DC

## 2017-11-13 NOTE — Telephone Encounter (Signed)
Alexandra called and stated that she spoke with DON of [place] earlier but haven't heard anything. Stated Patient's medications that [facility] sent to the pharmacy do not match what [facility] Gust has. They are all confused and needs a call back so the patient can get his night time medications. Claris Pong would like to speak with [facility] or Environmental consultant. Please call [phone]

## 2017-11-17 ENCOUNTER — Ambulatory Visit (HOSPITAL_COMMUNITY)
Admission: RE | Admit: 2017-11-17 | Discharge: 2017-11-17 | Disposition: A | Discharge status: Home / Self Care | Payer: Medicare Other | Source: Ambulatory Visit | Attending: Hematology | Admitting: Hematology

## 2017-11-17 DIAGNOSIS — K449 Diaphragmatic hernia without obstruction or gangrene: Secondary | ICD-10-CM | POA: Insufficient documentation

## 2017-11-17 DIAGNOSIS — J910 Malignant pleural effusion: Secondary | ICD-10-CM | POA: Insufficient documentation

## 2017-11-17 DIAGNOSIS — C3412 Malignant neoplasm of upper lobe, left bronchus or lung: Secondary | ICD-10-CM | POA: Diagnosis not present

## 2017-11-17 DIAGNOSIS — R0602 Shortness of breath: Secondary | ICD-10-CM | POA: Diagnosis not present

## 2017-11-17 DIAGNOSIS — Z951 Presence of aortocoronary bypass graft: Secondary | ICD-10-CM | POA: Insufficient documentation

## 2017-11-17 NOTE — Progress Notes (Signed)
HEMATOLOGY/ONCOLOGY CLINIC NOTE  Date of Service: 11/18/17    Patient Care Team: [name], MD as PCP - General (Internal Medicine) Jettie Booze, MD as PCP - Cardiology (Cardiology) Mast, [name], NP as Nurse Practitioner (Internal Medicine) [facility] as Nurse Practitioner (Hospice and Palliative Medicine)  CHIEF COMPLAINTS/PURPOSE OF CONSULTATION:   F/u for EGFR mutated metastatic lung adenocarcinoma  HISTORY OF PRESENTING ILLNESS:   Scott [name] is a wonderful 84 y.o. male who has been referred to [place] by Dr. [name], MD for evaluation of suspected lung cancer.   He has a hx of CAD, CKD, Diastolic CHF, HTN, HLD. He presented to the ED on 03/03/2017 for SOB and was found to have pleural effusion. He was also to have a mass in the upper lobe of left lung after a CT chest Wo contrast with results showing:IMPRESSION:1. 2.4 x 3.6 cm apparent mass in the posterior left upper lobe with associated left upper lobe interlobular septal thickening. Imaging features concerning for neoplasm with lymphangitic tumor spread. 2. Small left pleural effusion. 3. Large hiatal hernia contains nearly the entire stomach with evidence of organoaxial volvulus. No evidence for gastric obstruction. 4. 19 mm right adrenal adenoma.  He is accompanied by his wife and daughter. He states that he began feeling SOB 2 weeks ago. He is not a smoker and has not smoked in the past. He states that he has not been evaluated by his cardiologist, Dr. Larae Grooms. For his recent SOB.  On review of systems, patient reports fatigue, intermittent SOB, and denies fever, chills, CP, cough, weight loss, and any other accompanying symptoms.  INTERVAL HISTORY:   Scott [name] is here for f/u of his EGFR mutated lung adenocarcinoma. The patient's last visit with [place] was on 10/20/17. He is accompanied today by his daughter and granddaughter. The pt reports that he is doing well overall.    The pt reports that he has begun being on Oxygen recently, noting that his breathing has improved. He denies any current chest pain and SOB, and does not wish to have a thoracentesis.   He notes that he is continuing to eat well and has added five pounds since his last visit. He notes that he is not walking every day because he doesn't have anything that he needs to do.   Of note since the patient's last visit, pt has had CXR completed on 11/17/17 with results revealing Increased LEFT pleural effusion and basilar atelectasis. Large hiatal hernia. Enlargement of cardiac silhouette post CABG.  Lab results today (11/18/17) of CBC w/diff, CMP is as follows: all values are WNL except for HGB at 11.6, HCT at 37.3, MCHC at 31.1, RDW at 14.7, PLT at 116k, Glucose at 102, BUN at 31, Creatinine at 1.78, Calcium at 8.8, Total Protein at 6.4, Albumin at 3.4, GFR at 32. Magnesium 11/18/17 is WNL at 2.3  On review of systems, pt reports improved breathing, stable energy levels, weight gain, and denies chest pain, nausea, diarrhea, skin rashes, headaches, bone pains, abdominal pains, mouth sores, leg swelling, and any other symptoms.   MEDICAL HISTORY:  Past Medical History:  Diagnosis Date  . Anxiety   . Bradycardia    a. BB d/c'd in 2016.  [name] CAD (coronary artery disease)    a. 1983 s/p CABG [facility], [place]);  b. 2007 NSTEMI->med Rx for distal LCX dzs; c. 09/2014 NSTEMI/Cath: LIMA->LAD & diag ok, native RCA dominant and patent.   VG->RCA 100,  VG->LCX 100 -->Med Rx.  . CKD (chronic kidney disease), stage III ([place])   . Claudication ([place])    BLE  . Depression   . Diastolic dysfunction    a. 10/2014 Echo: F 60-65%, Gr1 DD, mild AS, mild AI, mild to mod MR, PASP 59mHg.  .Marland KitchenDyspnea 2010   syndrome - extensive  . Erectile dysfunction   . GERD (gastroesophageal reflux disease)   . H/O hiatal hernia   . History of blood transfusion 2014  . Hx of adenomatous colonic polyps 2007  . Hyperlipidemia   .  Hypertensive heart disease   . Inguinal hernia   . Iron deficiency anemia   . [place]   . Shingles 1990's  . Sigmoid diverticulosis   . Upper GI bleeding 2014   Gastritis and possible duodenal ulcer seen on EGD, felt secondary to NSAIDs  . Urethral stricture 11/2008  . Vasovagal syncope 05/2006  . Weight loss     SURGICAL HISTORY: Past Surgical History:  Procedure Laterality Date  . CARDIAC CATHETERIZATION  2007   Left main 100%, RCA 70% ostial, all grafts were patent, EF 40-45 percent, distal circumflex subtotal after graft insertion, medical therapy   . CARDIAC CATHETERIZATION N/A 10/14/2014   Procedure: Left Heart Cath and Cors/Grafts Angiography;  Surgeon: JLorretta Harp MD;  [facility];  Service: Cardiovascular;  Laterality: N/A;  . CHOLECYSTECTOMY  1980's  . CORONARY ARTERY BYPASS GRAFT  1994    LIMA-LAD, SVG-D1, SVG-OM,  SVG[place]RCA[place] . ESOPHAGOGASTRODUODENOSCOPY N/A 03/25/2013   Procedure: ESOPHAGOGASTRODUODENOSCOPY (EGD);  Surgeon: JMissy Sabins MD;  gastritis, cannot rule out duodenal ulcer   . INGUINAL HERNIA REPAIR Left   . IR THORACENTESIS ASP PLEURAL SPACE W/IMG GUIDE  10/07/2017  . TRANSURETHRAL RESECTION OF PROSTATE      SOCIAL HISTORY: Social History   Socioeconomic History  . Marital status: Married    Spouse name: Not on file  . Number of children: Not on file  . Years of education: Not on file  . Highest education level: Not on file  Occupational History  . Occupation: Retired  SScientific laboratory technician . Financial resource strain: Not hard at all  . Food insecurity:    Worry: Never true    Inability: Never true  . Transportation needs:    Medical: No    Non-medical: No  Tobacco Use  . Smoking status: Never Smoker  . Smokeless tobacco: Never Used  Substance and Sexual Activity  . Alcohol use: Not Currently    Alcohol/week: 4.2 oz    Types: 7 Glasses of wine per week    Comment: wine - daily -  currently not drinking  . Drug use: No  . Sexual activity: Never    Birth control/protection: Abstinence  Lifestyle  . Physical activity:    Days per week: 0 days    Minutes per session: 0 min  . Stress: Not at all  Relationships  . Social connections:    Talks on phone: More than three times a week    Gets together: More than three times a week    Attends religious service: Never    Active member of club or organization: No    Attends meetings of clubs or organizations: Never    Relationship status: Married  . Intimate partner violence:    Fear of current or ex partner: No    Emotionally abused: No    Physically abused: No  Forced sexual activity: No  Other Topics Concern  . Not on file  Social History Narrative   He lives in [facility], independent living, with his wife.    FAMILY HISTORY: Family History  Problem Relation Age of Onset  . Hypertension Mother   . Heart disease Mother   . Heart disease Father   . Heart failure Brother   . Heart failure Sister   . Heart failure Brother   . Heart attack Neg Hx   . Stroke Neg Hx     ALLERGIES:  has No Known Allergies.  MEDICATIONS:  Current Outpatient Medications  Medication Sig Dispense Refill  . acetaminophen (TYLENOL) 500 MG tablet Take 1 tablet (500 mg total) by mouth every 6 (six) hours as needed for mild pain or moderate pain. 30 tablet 2  . albuterol (PROAIR HFA) 108 (90 Base) MCG/ACT inhaler Inhale 2 puffs into the lungs every 6 (six) hours as needed for wheezing or shortness of breath. 18 g 2  . atorvastatin (LIPITOR) 40 MG tablet Take 1 tablet (40 mg total) by mouth daily at 6 PM. 90 tablet 2  . BREO ELLIPTA 100-25 MCG/INH AEPB Inhale 1 puff into the lungs daily. 28 each 2  . buPROPion (WELLBUTRIN SR) 150 MG 12 hr tablet Take 1 tablet (150 mg total) by mouth daily. 30 tablet 2  . clopidogrel (PLAVIX) 75 MG tablet Take 1 tablet (75 mg total) by mouth daily. 30 tablet 0  . Desvenlafaxine Succinate ER 25 MG  TB24 Take 25 mg by mouth daily. 30 tablet 2  . FERROCITE 324 MG TABS tablet Take 1 tablet (106 mg of iron total) by mouth daily. 90 tablet 2  . isosorbide mononitrate (IMDUR) 30 MG 24 hr tablet Take 1 tablet (30 mg total) by mouth daily. 30 tablet 2  . nitroGLYCERIN (NITROSTAT) 0.4 MG SL tablet Place 1 tablet (0.4 mg total) under the tongue every 5 (five) minutes x 3 doses as needed for chest pain. 25 tablet 4  . Omega-3 Fatty Acids (FISH OIL) 1000 MG CAPS Take 1 capsule (1,000 mg total) by mouth daily. 90 capsule 0  . osimertinib mesylate (TAGRISSO) 80 MG tablet TAKE 1 TABLET (80 MG TOTAL) BY MOUTH DAILY. 30 tablet 3  . OXYGEN Inhale 2 L into the lungs continuous.    . pantoprazole (PROTONIX) 40 MG tablet Take 1 tablet (40 mg total) by mouth 2 (two) times daily before a meal. 60 tablet 3  . vitamin B-12 (CYANOCOBALAMIN) 500 MCG tablet Take 1 tablet (500 mcg total) by mouth daily. complex 30 tablet 2   No current facility-administered medications for this visit.     REVIEW OF SYSTEMS:    A 10+ POINT REVIEW OF SYSTEMS WAS OBTAINED including neurology, dermatology, psychiatry, cardiac, respiratory, lymph, extremities, GI, GU, Musculoskeletal, constitutional, breasts, reproductive, HEENT.  All pertinent positives are noted in the HPI.  All others are negative.   PHYSICAL EXAMINATION:  ECOG PERFORMANCE STATUS: 2 - Symptomatic, <50% confined to bed  .BP 120/62 (BP Location: Left Arm, Patient Position: Sitting)   Pulse 73   Temp 98.1 F (36.7 C) (Oral)   Resp 18   Ht 5' 6"  (1.676 m)   Wt 152 lb 4.8 oz (69.1 kg)   SpO2 97%   BMI 24.58 kg/m   GENERAL:alert, in no acute distress and comfortable SKIN: no acute rashes, no significant lesions EYES: conjunctiva are pink and non-injected, sclera anicteric OROPHARYNX: MMM, no exudates, no oropharyngeal erythema or ulceration NECK:  supple, no JVD LYMPH:  no palpable lymphadenopathy in the cervical, axillary or inguinal regions LUNGS: left lung  base decreased breath sounds HEART: regular rate & rhythm ABDOMEN:  normoactive bowel sounds , non tender, not distended. Extremity: trace pedal edema PSYCH: alert & oriented x 3 with fluent speech NEURO: no focal motor/sensory deficits    LABORATORY DATA:  I have reviewed the data as listed  . CBC Latest Ref Rng & Units 11/18/2017 11/04/2017 10/21/2017  WBC 4.0 - 10.3 K/uL 5.7 5.7 6.4  Hemoglobin 13.0 - 17.1 g/dL 11.6(L) 10.9(A) 11.8(A)  Hematocrit 38.4 - 49.9 % 37.3(L) 33(A) 36(A)  Platelets 140 - 400 K/uL 116(L) 114(A) 159  HGB 12.2  . CMP Latest Ref Rng & Units 11/18/2017 10/21/2017 10/20/2017  Glucose 70 - 99 mg/dL 102(H) - 88  BUN 8 - 23 mg/dL 31(H) 29(A) 26  Creatinine 0.61 - 1.24 mg/dL 1.78(H) 1.8(A) 1.92(H)  Sodium 135 - 145 mmol/L 142 141 141  Potassium 3.5 - 5.1 mmol/L 4.1 4.1 4.5  Chloride 98 - 111 mmol/L 107 108 106  CO2 22 - 32 mmol/L 28 25 26   Calcium 8.9 - 10.3 mg/dL 8.8(L) 9.0 9.4  Total Protein 6.5 - 8.1 g/dL 6.4(L) - 6.5  Total Bilirubin 0.3 - 1.2 mg/dL 0.4 - 0.4  Alkaline Phos 38 - 126 U/L 72 67 78  AST 15 - 41 U/L 21 17 21   ALT 0 - 44 U/L 20 17 20    Component     Latest Ref Rng & Units 08/14/2017  TSH     0.450 - 4.500 uIU/mL 2.530  Thyroxine (T4)     4.5 - 12.0 ug/dL 6.5  T3 Uptake Ratio     24 - 39 % 26  Free Thyroxine Index     1.2 - 4.9 1.7     RADIOGRAPHIC STUDIES: I have personally reviewed the radiological images as listed and agreed with the findings in the report. Dg Chest 2 View  Result Date: 11/17/2017 CLINICAL DATA:  Increasing shortness of breath, history of malignant pleural effusion, LEFT upper lobe adenocarcinoma lung, coronary artery disease post NSTEMI and CABG, stage III chronic kidney disease, hypertension EXAM: CHEST - 2 VIEW COMPARISON:  10/07/2017 FINDINGS: Enlargement of cardiac silhouette post CABG. Pulmonary vascularity normal. Large hiatal hernia again identified. Increased pleural effusion and atelectasis at LEFT base. Upper lungs  clear. Pleural thickening/effusion throughout LEFT hemithorax again identified. RIGHT lung clear. No pneumothorax. Bones demineralized. IMPRESSION: Increased LEFT pleural effusion and basilar atelectasis. Large hiatal hernia. Enlargement of cardiac silhouette post CABG. Electronically Signed   By: Lavonia Dana M.D.   On: 11/17/2017 13:19    ASSESSMENT & PLAN:   TYRESS LODEN is a wonderful 84 y.o. male with  1) Metastatic Left lung Adenocarcinoma involving LUL lung mass left lung with associated moderated malignant left pleural effusion and left hilar LNadenopathy. EGFR mutated lung adenocarcinoma 10/14/17 CT with pt and family which revealed The hypermetabolic left upper lobe lesion has decreased in size from the PET-CT of 03/17/2017. Scattered small right lung nodules are stable. No evidence of disease progression.   2) DOE improved s/p recent therapeutic thoracentesis. But rpt CXR today still shows significant left pleural effusion which on recent decubitus XR was noted to be free flowing.  3) Grade 1-2 fatigue -- multifactorial Age+ significant burden of medical issues + SOE/DOE from pleural effusion + fatigue from medication. Much improved with dexamethasone and after recent therapeutic thoracentesis.  PLAN   Discussed pt labwork  today, 11/18/17; blood counts are stable -Discussed 11/17/17 CXR which revealed returned Left Pleural effusion -Discussed that if the pt is having limiting symptoms we would set him up for a thoracentesis- he will call me immediately if he develops SOB  -Will see pt back in 8 weeks with repeat CT -Discussed that removing fluid a week before CT would be helpful for getting an accurate picture of the lung mass  -The pt has no prohibitive toxicities from continuing [place] at this time.   -Heart and CKD are likely also at play with pleural effusions -Recommended that the pt continue optimizing fluid status with cardiologist -Pleurex catheter not recommended at  this time given about-monthly frequency of draining, desire to prevent infections, and with reasonable control of fluid status - pt agrees with this.  - 3) Grade 1-2 Fatigue - multifactorial -- improved some. Component     Latest Ref Rng & Units 08/14/2017  TSH     0.450 - 4.500 uIU/mL 2.530  Thyroxine (T4)     4.5 - 12.0 ug/dL 6.5  T3 Uptake Ratio     24 - 39 % 26  Free Thyroxine Index     1.2 - 4.9 1.7   -thyroid function tests WNL -optimizing nutrition -maintain active lifestyle.  4) Anorexia. -improved with low dose dexamethasone. -encouraged to continue good po intake. -Advised with the patientto continue to use boost or ensure as a nutritional supplement.    CT chest without contrast in 7 weeks RTC with Dr Irene Limbo in 8 weeks with labs and CT chest    All of the patients questions were answered with apparent satisfaction. The patient knows to call the clinic with any problems, questions or concerns.  The total time spent in the appt was 25 minutes and more than 50% was on counseling and direct patient cares.     Sullivan Lone MD [place] AAHIVMS [facility] Hematology/Oncology Physician [facility]  (Office):       [phone] (Work cell):  [phone] (Fax):           [phone]  I, Baldwin [place], am acting as a scribe for Dr Irene Limbo.   .I have reviewed the above documentation for accuracy and completeness, and I agree with the above. Brunetta Genera MD

## 2017-11-18 ENCOUNTER — Inpatient Hospital Stay: Payer: Medicare Other

## 2017-11-18 ENCOUNTER — Inpatient Hospital Stay: Payer: Medicare Other | Attending: Hematology | Admitting: Hematology

## 2017-11-18 ENCOUNTER — Telehealth: Payer: Self-pay | Admitting: Hematology

## 2017-11-18 VITALS — BP 120/62 | HR 73 | Temp 98.1°F | Resp 18 | Ht 66.0 in | Wt 152.3 lb

## 2017-11-18 DIAGNOSIS — N183 Chronic kidney disease, stage 3 (moderate): Secondary | ICD-10-CM

## 2017-11-18 DIAGNOSIS — Z79899 Other long term (current) drug therapy: Secondary | ICD-10-CM | POA: Insufficient documentation

## 2017-11-18 DIAGNOSIS — J910 Malignant pleural effusion: Secondary | ICD-10-CM | POA: Diagnosis not present

## 2017-11-18 DIAGNOSIS — I130 Hypertensive heart and chronic kidney disease with heart failure and stage 1 through stage 4 chronic kidney disease, or unspecified chronic kidney disease: Secondary | ICD-10-CM | POA: Diagnosis not present

## 2017-11-18 DIAGNOSIS — C3490 Malignant neoplasm of unspecified part of unspecified bronchus or lung: Secondary | ICD-10-CM

## 2017-11-18 DIAGNOSIS — Z7982 Long term (current) use of aspirin: Secondary | ICD-10-CM | POA: Insufficient documentation

## 2017-11-18 DIAGNOSIS — R630 Anorexia: Secondary | ICD-10-CM | POA: Diagnosis not present

## 2017-11-18 DIAGNOSIS — C3412 Malignant neoplasm of upper lobe, left bronchus or lung: Secondary | ICD-10-CM | POA: Diagnosis not present

## 2017-11-18 DIAGNOSIS — Z9981 Dependence on supplemental oxygen: Secondary | ICD-10-CM | POA: Diagnosis not present

## 2017-11-18 DIAGNOSIS — I5032 Chronic diastolic (congestive) heart failure: Secondary | ICD-10-CM | POA: Diagnosis not present

## 2017-11-18 DIAGNOSIS — R918 Other nonspecific abnormal finding of lung field: Secondary | ICD-10-CM | POA: Diagnosis not present

## 2017-11-18 LAB — CMP (CANCER CENTER ONLY)
ALT: 20 U/L (ref 0–44)
ANION GAP: 7 (ref 5–15)
AST: 21 U/L (ref 15–41)
Albumin: 3.4 g/dL — ABNORMAL LOW (ref 3.5–5.0)
Alkaline Phosphatase: 72 U/L (ref 38–126)
BUN: 31 mg/dL — ABNORMAL HIGH (ref 8–23)
CHLORIDE: 107 mmol/L (ref 98–111)
CO2: 28 mmol/L (ref 22–32)
Calcium: 8.8 mg/dL — ABNORMAL LOW (ref 8.9–10.3)
Creatinine: 1.78 mg/dL — ABNORMAL HIGH (ref 0.61–1.24)
GFR, EST NON AFRICAN AMERICAN: 32 mL/min — AB (ref >60)
GFR, Est AFR Am: 37 mL/min — ABNORMAL LOW (ref >60)
Glucose, Bld: 102 mg/dL — ABNORMAL HIGH (ref 70–99)
POTASSIUM: 4.1 mmol/L (ref 3.5–5.1)
Sodium: 142 mmol/L (ref 135–145)
TOTAL PROTEIN: 6.4 g/dL — AB (ref 6.5–8.1)
Total Bilirubin: 0.4 mg/dL (ref 0.3–1.2)

## 2017-11-18 LAB — CBC WITH DIFFERENTIAL/PLATELET
BASOS ABS: 0 10*3/uL (ref 0.0–0.1)
Basophils Relative: 0 %
EOS PCT: 4 %
Eosinophils Absolute: 0.2 10*3/uL (ref 0.0–0.5)
HCT: 37.3 % — ABNORMAL LOW (ref 38.4–49.9)
Hemoglobin: 11.6 g/dL — ABNORMAL LOW (ref 13.0–17.1)
LYMPHS ABS: 0.9 10*3/uL (ref 0.9–3.3)
LYMPHS PCT: 16 %
MCH: 27.4 pg (ref 27.2–33.4)
MCHC: 31.1 g/dL — AB (ref 32.0–36.0)
MCV: 88.2 fL (ref 79.3–98.0)
MONO ABS: 0.5 10*3/uL (ref 0.1–0.9)
MONOS PCT: 9 %
Neutro Abs: 4 10*3/uL (ref 1.5–6.5)
Neutrophils Relative %: 71 %
PLATELETS: 116 10*3/uL — AB (ref 140–400)
RBC: 4.23 MIL/uL (ref 4.20–5.82)
RDW: 14.7 % — AB (ref 11.0–14.6)
WBC: 5.7 10*3/uL (ref 4.0–10.3)

## 2017-11-18 LAB — MAGNESIUM: MAGNESIUM: 2.3 mg/dL (ref 1.7–2.4)

## 2017-11-18 MED ORDER — OSIMERTINIB MESYLATE 80 MG PO TABS: ORAL_TABLET | ORAL | 3 refills | Status: DC

## 2017-11-18 NOTE — Telephone Encounter (Signed)
Appointments scheduled AVS/Calendar printed per 7/2 los °

## 2017-11-19 NOTE — Telephone Encounter (Signed)
LMOM for Scott [name] to call me regarding his medications.

## 2017-11-25 ENCOUNTER — Non-Acute Institutional Stay: Payer: Medicare Other | Admitting: Internal Medicine

## 2017-11-25 ENCOUNTER — Encounter: Payer: Self-pay | Admitting: Internal Medicine

## 2017-11-25 VITALS — BP 118/62 | HR 77 | Temp 98.2°F | Resp 16 | Ht 66.0 in | Wt 152.6 lb

## 2017-11-25 DIAGNOSIS — R5382 Chronic fatigue, unspecified: Secondary | ICD-10-CM

## 2017-11-25 DIAGNOSIS — R739 Hyperglycemia, unspecified: Secondary | ICD-10-CM

## 2017-11-25 DIAGNOSIS — K219 Gastro-esophageal reflux disease without esophagitis: Secondary | ICD-10-CM | POA: Diagnosis not present

## 2017-11-25 DIAGNOSIS — I10 Essential (primary) hypertension: Secondary | ICD-10-CM

## 2017-11-25 DIAGNOSIS — J9611 Chronic respiratory failure with hypoxia: Secondary | ICD-10-CM

## 2017-11-25 DIAGNOSIS — I25700 Atherosclerosis of coronary artery bypass graft(s), unspecified, with unstable angina pectoris: Secondary | ICD-10-CM

## 2017-11-25 DIAGNOSIS — N183 Chronic kidney disease, stage 3 unspecified: Secondary | ICD-10-CM

## 2017-11-25 DIAGNOSIS — I5032 Chronic diastolic (congestive) heart failure: Secondary | ICD-10-CM

## 2017-11-25 DIAGNOSIS — R2681 Unsteadiness on feet: Secondary | ICD-10-CM | POA: Diagnosis not present

## 2017-11-25 NOTE — Telephone Encounter (Signed)
Scott [name] please advise if this message was addressed

## 2017-11-25 NOTE — Progress Notes (Signed)
[facility]  Provider: [name] MD   Location:  [place] of Service:  Clinic (12)  PCP: [name], MD Patient Care Team: [name], MD as PCP - General (Internal Medicine) Jettie Booze, MD as PCP - Cardiology (Cardiology) Mast, [name], NP as Nurse Practitioner (Internal Medicine) Lady Gary, [facility] as Nurse Practitioner [facility] and Palliative Medicine)  Extended Emergency Contact Information Primary Emergency Contact: [facility] Address: [address]          [place], [place] [address] Johnnette Litter of [place] Phone: [phone] Relation: Spouse Secondary Emergency Contact: Servidio,[name] Address: [place] [place], [place] [address] Johnnette Litter of [place] Phone: [phone] Work Phone: [phone] Mobile Phone: [phone] Relation: Daughter  Code Status: DNR  Goals of Care: Advanced Directive information Advanced Directives 11/25/2017  Does Patient Have a Medical Advance Directive? Yes  Type of Paramedic of [place];Out of facility DNR (pink MOST or yellow form)  Does patient want to make changes to medical advance directive? No - Patient declined  Copy of [place] in Chart? Yes  Would patient like information on creating a medical advance directive? -  Pre-existing out of facility DNR order (yellow form or pink MOST form) Yellow form placed in chart (order not valid for inpatient use)      Chief Complaint  Patient presents with  . Acute Visit    routine follow up    HPI: Patient is a 84 y.o. male seen today for routine visit. He was in the hospital and then short term rehabilitation post NSTEMI. He is now in his apartment with his wife. He is here for routine follow up. His wife and daughter are present with him this visit. He denies any concern this visit. He is on continuous oxygen. He gets  thoracocentesis for symptomatic relief for malignant pleural effusion. He needs a form filled out for continuation of oxygen by home health agency. He has comfort keepers come to his apartment to help with his ADLs and medications are now in blister packs to help him self administer it.   Past Medical History:  Diagnosis Date  . Anxiety   . Bradycardia    a. BB d/c'd in 2016.  [name] CAD (coronary artery disease)    a. 1983 s/p CABG [facility] [place] [facility] [place]);  b. 2007 NSTEMI->med Rx for distal LCX dzs; c. 09/2014 NSTEMI/Cath: LIMA->LAD & diag ok, native RCA dominant and patent.   VG->RCA 100, VG->LCX 100 -->Med Rx.  . CKD (chronic kidney disease), stage III ([place])   . Claudication ([place])    BLE  . Depression   . Diastolic dysfunction    a. 10/2014 Echo: F 60-65%, Gr1 DD, mild AS, mild AI, mild to mod MR, PASP 8mmHg.  [name] Dyspnea 2010   syndrome - extensive  . Erectile dysfunction   . GERD (gastroesophageal reflux disease)   . H/O hiatal hernia   . History of blood transfusion 2014  . Hx of adenomatous colonic polyps 2007  . Hyperlipidemia   . Hypertensive heart disease   . Inguinal hernia   . Iron deficiency anemia   . Lung cancer ([place])   . Shingles 1990's  . Sigmoid diverticulosis   . Upper GI bleeding 2014   Gastritis and possible duodenal ulcer seen on EGD, felt secondary to NSAIDs  .  Urethral stricture 11/2008  . Vasovagal syncope 05/2006  . Weight loss    Past Surgical History:  Procedure Laterality Date  . CARDIAC CATHETERIZATION  2007   Left main 100%, RCA 70% ostial, all grafts were patent, EF 40-45 percent, distal circumflex subtotal after graft insertion, medical therapy   . CARDIAC CATHETERIZATION N/A 10/14/2014   Procedure: Left Heart Cath and Cors/Grafts Angiography;  Surgeon: Lorretta Harp, MD;  Location: [place] [facility];  Service: Cardiovascular;  Laterality: N/A;  . CHOLECYSTECTOMY  1980's  . CORONARY ARTERY BYPASS GRAFT  1994    LIMA-LAD, SVG-D1, SVG-OM,  SVG-RCA;  performed in [place], [place]  . ESOPHAGOGASTRODUODENOSCOPY N/A 03/25/2013   Procedure: ESOPHAGOGASTRODUODENOSCOPY (EGD);  Surgeon: Missy Sabins, MD;  gastritis, cannot rule out duodenal ulcer   . INGUINAL HERNIA REPAIR Left   . IR THORACENTESIS ASP PLEURAL SPACE W/IMG GUIDE  10/07/2017  . TRANSURETHRAL RESECTION OF PROSTATE      reports that he has never smoked. He has never used smokeless tobacco. He reports that he drank about 4.2 oz of alcohol per week. He reports that he does not use drugs. Social History   Socioeconomic History  . Marital status: Married    Spouse name: Not on file  . Number of children: Not on file  . Years of education: Not on file  . Highest education level: Not on file  Occupational History  . Occupation: Retired  Scientific laboratory technician  . Financial resource strain: Not hard at all  . Food insecurity:    Worry: Never true    Inability: Never true  . Transportation needs:    Medical: No    Non-medical: No  Tobacco Use  . Smoking status: Never Smoker  . Smokeless tobacco: Never Used  Substance and Sexual Activity  . Alcohol use: Not Currently    Alcohol/week: 4.2 oz    Types: 7 Glasses of wine per week    Comment: wine - daily - currently not drinking  . Drug use: No  . Sexual activity: Never    Birth control/protection: Abstinence  Lifestyle  . Physical activity:    Days per week: 0 days    Minutes per session: 0 min  . Stress: Not at all  Relationships  . Social connections:    Talks on phone: More than three times a week    Gets together: More than three times a week    Attends religious service: Never    Active member of club or organization: No    Attends meetings of clubs or organizations: Never    Relationship status: Married  . Intimate partner violence:    Fear of current or ex partner: No    Emotionally abused: No    Physically abused: No    Forced sexual activity: No  Other Topics Concern  . Not on file  Social History Narrative     He lives in [facility], independent living, with his wife.    Functional Status Survey:    Family History  Problem Relation Age of Onset  . Hypertension Mother   . Heart disease Mother   . Heart disease Father   . Heart failure Brother   . Heart failure Sister   . Heart failure Brother   . Heart attack Neg Hx   . Stroke Neg Hx     Health Maintenance  Topic Date Due  . INFLUENZA VACCINE  12/18/2017  . PNA vac Low Risk Adult (2 of 2 - PCV13)  05/20/2018  . TETANUS/TDAP  07/11/2022    No Known Allergies  Outpatient Encounter Medications as of 11/25/2017  Medication Sig  . acetaminophen (TYLENOL) 500 MG tablet Take 1 tablet (500 mg total) by mouth every 6 (six) hours as needed for mild pain or moderate pain.  [name] albuterol (PROAIR HFA) 108 (90 Base) MCG/ACT inhaler Inhale 2 puffs into the lungs every 6 (six) hours as needed for wheezing or shortness of breath.  [name] atorvastatin (LIPITOR) 40 MG tablet Take 1 tablet (40 mg total) by mouth daily at 6 PM.  . BREO ELLIPTA 100-25 MCG/INH AEPB Inhale 1 puff into the lungs daily.  [name] buPROPion (WELLBUTRIN SR) 150 MG 12 hr tablet Take 1 tablet (150 mg total) by mouth daily.  . clopidogrel (PLAVIX) 75 MG tablet Take 1 tablet (75 mg total) by mouth daily.  [name] Desvenlafaxine Succinate ER 25 MG TB24 Take 25 mg by mouth daily.  [name] FERROCITE 324 MG TABS tablet Take 1 tablet (106 mg of iron total) by mouth daily.  . isosorbide mononitrate (IMDUR) 30 MG 24 hr tablet Take 1 tablet (30 mg total) by mouth daily.  . nitroGLYCERIN (NITROSTAT) 0.4 MG SL tablet Place 1 tablet (0.4 mg total) under the tongue every 5 (five) minutes x 3 doses as needed for chest pain.  . Omega-3 Fatty Acids (FISH OIL) 1000 MG CAPS Take 1 capsule (1,000 mg total) by mouth daily.  [name] osimertinib mesylate (TAGRISSO) 80 MG tablet TAKE 1 TABLET (80 MG TOTAL) BY MOUTH DAILY.  [name] OXYGEN Inhale 2 L into the lungs continuous.  . pantoprazole (PROTONIX) 40 MG tablet Take 1 tablet (40 mg  total) by mouth 2 (two) times daily before a meal.  . vitamin B-12 (CYANOCOBALAMIN) 500 MCG tablet Take 1 tablet (500 mcg total) by mouth daily. complex   No facility-administered encounter medications on file as of 11/25/2017.     Review of Systems  Constitutional: Positive for fatigue. Negative for appetite change, chills and fever.  HENT: Negative for congestion and rhinorrhea.   Respiratory: Positive for cough and shortness of breath. Negative for wheezing.   Cardiovascular: Negative for chest pain, palpitations and leg swelling.  Gastrointestinal: Negative for abdominal pain, constipation, diarrhea, nausea and vomiting.  Genitourinary: Negative for dysuria.  Musculoskeletal: Positive for gait problem. Negative for back pain.       No fall reported  Skin: Negative for rash and wound.  Neurological: Negative for light-headedness and headaches.  Psychiatric/Behavioral: Negative for behavioral problems and confusion.    Vitals:   11/25/17 1136  BP: 118/62  Pulse: 77  Resp: 16  Temp: 98.2 F (36.8 C)  TempSrc: Oral  SpO2: 95%  Weight: 152 lb 9.6 oz (69.2 kg)  Height: 5\' 6"  (1.676 m)   Body mass index is 24.63 kg/m.   Wt Readings from Last 3 Encounters:  11/25/17 152 lb 9.6 oz (69.2 kg)  11/18/17 152 lb 4.8 oz (69.1 kg)  10/30/17 150 lb 14.4 oz (68.4 kg)   Physical Exam  Constitutional: He is oriented to person, place, and time. He appears well-developed and well-nourished. No distress.  HENT:  Head: Normocephalic and atraumatic.  Right Ear: External ear normal.  Left Ear: External ear normal.  Mouth/Throat: Oropharynx is clear and moist. No oropharyngeal exudate.  Eyes: Pupils are equal, round, and reactive to light. Conjunctivae and EOM are normal. Right eye exhibits no discharge. Left eye exhibits no discharge.  Neck: Normal range of motion. Neck supple.  Cardiovascular: Normal rate  and regular rhythm.  Pulmonary/Chest: Effort normal. No respiratory distress. He has  no wheezes.  Decreased breath sounds bilaterally, on oxygen 2l/min by nasal canula  Abdominal: Soft. Bowel sounds are normal.  Musculoskeletal: He exhibits no edema.  Unsteady gait, uses walker  Lymphadenopathy:    He has no cervical adenopathy.  Neurological: He is alert and oriented to person, place, and time. He exhibits normal muscle tone.  Skin: Skin is warm and dry. He is not diaphoretic.  Psychiatric: He has a normal mood and affect.    Labs reviewed: Basic Metabolic Panel: Recent Labs    05/23/17 0947  06/20/17 1054  08/01/17 1106  10/07/17 0351  10/20/17 0842 10/21/17 11/18/17 0924  NA  --    < > 140   < > 142   < > 141   < > 141 141 142  K  --    < > 5.4*   < > 4.5   < > 4.4   < > 4.5 4.1 4.1  CL  --   --  107   < > 109   < > 108   < > 106 108 107  CO2  --    < > 26   < > 25   < > 22   < > 26 25 28   GLUCOSE  --    < > 104  --  65*   < > 96  --  88  --  102*  BUN  --    < > 35*   < > 26   < > 33*   < > 26 29* 31*  CREATININE  --    < > 2.30*   < > 1.69*   < > 1.77*   < > 1.92* 1.8* 1.78*  CALCIUM  --    < > 9.3   < > 9.3   < > 9.2   < > 9.4 9.0 8.8*  MG  --    < > 3.0*  --  2.4  --   --   --   --   --  2.3  PHOS 3.7  --  3.4  --   --   --   --   --   --   --   --    < > = values in this interval not displayed.   Liver Function Tests: Recent Labs    09/15/17 0924 10/20/17 0842 10/21/17 11/18/17 0924  AST 31 21 17 21   ALT 28 20 17 20   ALKPHOS 75 78 67 72  BILITOT 0.2 0.4  --  0.4  PROT 7.0 6.5  --  6.4*  ALBUMIN 3.3* 3.3* 3.4 3.4*   Recent Labs    04/09/17 1352  LIPASE 48   No results for input(s): AMMONIA in the last 8760 hours. CBC: Recent Labs    09/15/17 0924  10/08/17 0321  10/20/17 0842 10/21/17 11/04/17 11/18/17 0924  WBC 5.8   < > 7.5   < > 7.2 6.4 5.7 5.7  NEUTROABS 4.0  --   --   --  5.2  --   --  4.0  HGB 12.1*   < > 11.5*   < > 12.5* 11.8* 10.9* 11.6*  HCT 38.5   < > 36.9*   < > 39.8 36* 33* 37.3*  MCV 89.5   < > 88.3  --  88.4  --    --  88.2  PLT 153   < >  157   < > 141 159 114* 116*   < > = values in this interval not displayed.   Cardiac Enzymes: Recent Labs    10/05/17 1521 10/05/17 2035 10/06/17 0240  TROPONINI 10.07* 10.29* 8.99*   BNP: Invalid input(s): POCBNP No results found for: HGBA1C Lab Results  Component Value Date   TSH 2.530 08/14/2017   Lab Results  Component Value Date   [id number] 701 01/23/2016   No results found for: FOLATE No results found for: IRON, TIBC, FERRITIN  Lipid Panel: No results for input(s): CHOL, HDL, LDLCALC, TRIG, CHOLHDL, LDLDIRECT in the last 8760 hours. No results found for: HGBA1C  Procedures since last visit: Dg Chest 2 View  Result Date: 11/17/2017 CLINICAL DATA:  Increasing shortness of breath, history of malignant pleural effusion, LEFT upper lobe adenocarcinoma lung, coronary artery disease post NSTEMI and CABG, stage III chronic kidney disease, hypertension EXAM: CHEST - 2 VIEW COMPARISON:  10/07/2017 FINDINGS: Enlargement of cardiac silhouette post CABG. Pulmonary vascularity normal. Large hiatal hernia again identified. Increased pleural effusion and atelectasis at LEFT base. Upper lungs clear. Pleural thickening/effusion throughout LEFT hemithorax again identified. RIGHT lung clear. No pneumothorax. Bones demineralized. IMPRESSION: Increased LEFT pleural effusion and basilar atelectasis. Large hiatal hernia. Enlargement of cardiac silhouette post CABG. Electronically Signed   By: Lavonia Dana M.D.   On: 11/17/2017 13:19    Assessment/Plan  1. Unsteady gait C/w walker, fall precautions, PT consult  2. Chronic fatigue Ongoing, multifactorial. Stable TSH. Has depression, anemia of chronic disease, CHF, chronic respiratory failure and recurrent left sided malignant pleural effusion, lung adenocarcinoma. Supportive care, iron supplement, antidepressant. PT consult to work on strengthening as tolerated.   3. Essential hypertension Stable BP, continue imdur 30  mg daily. Reviewed renal function  4. Coronary artery disease involving coronary bypass graft of native heart with unstable angina pectoris (HCC) Chest pain free. Continue prn NTG, imdur 30 mg daily, atorvastatin and fish oil with daily plavix  5. Chronic diastolic CHF (congestive heart failure) (HCC) C/w imdur, monitor clinically.   6. CKD (chronic kidney disease), stage III ([place]) Monitor clinically.   7. Gastroesophageal reflux disease, esophagitis presence not specified contorlled symptom, continue protonix  8. Chronic respiratory failure with hypoxia (HCC) Continue o2 by nasal canula at 2 liter/ min. Form filled out for continuation on oxygen. Continue bronchodilator  9. Hyperglycemia With his advanced age, monitor clinically.    Labs/tests ordered:  Has pending labs at cancer center  Next appointment: 3 months  Communication: reviewed care plan with patient, his wife and daughter    [name], MD Internal Medicine [address] [place],  [address] Cell Phone (Monday-Friday 8 am - 5 pm): [phone] On Call: [phone] and follow prompts after 5 pm and on weekends Office Phone: [phone] Office Fax: [phone]

## 2017-11-25 NOTE — Patient Instructions (Signed)
You will be evaluated by physical therapy team in your apartment.

## 2017-11-25 NOTE — Telephone Encounter (Signed)
LMOM for Scott [name] to return call , never heard anything and [facility] was established in the home.

## 2017-11-27 MED FILL — TAGRISSO 80 MG TABLET: 80 | 30 days supply | Qty: 30 | Fill #1

## 2017-12-10 DIAGNOSIS — K219 Gastro-esophageal reflux disease without esophagitis: Secondary | ICD-10-CM | POA: Diagnosis not present

## 2017-12-10 DIAGNOSIS — N400 Enlarged prostate without lower urinary tract symptoms: Secondary | ICD-10-CM | POA: Diagnosis not present

## 2017-12-10 DIAGNOSIS — I2510 Atherosclerotic heart disease of native coronary artery without angina pectoris: Secondary | ICD-10-CM | POA: Diagnosis not present

## 2017-12-10 DIAGNOSIS — M6281 Muscle weakness (generalized): Secondary | ICD-10-CM | POA: Diagnosis not present

## 2017-12-10 DIAGNOSIS — R2681 Unsteadiness on feet: Secondary | ICD-10-CM | POA: Diagnosis not present

## 2017-12-10 DIAGNOSIS — R55 Syncope and collapse: Secondary | ICD-10-CM | POA: Diagnosis not present

## 2017-12-10 DIAGNOSIS — E785 Hyperlipidemia, unspecified: Secondary | ICD-10-CM | POA: Diagnosis not present

## 2017-12-10 DIAGNOSIS — R0600 Dyspnea, unspecified: Secondary | ICD-10-CM | POA: Diagnosis not present

## 2017-12-10 DIAGNOSIS — D509 Iron deficiency anemia, unspecified: Secondary | ICD-10-CM | POA: Diagnosis not present

## 2017-12-10 DIAGNOSIS — Z86010 Personal history of colonic polyps: Secondary | ICD-10-CM | POA: Diagnosis not present

## 2017-12-10 DIAGNOSIS — B029 Zoster without complications: Secondary | ICD-10-CM | POA: Diagnosis not present

## 2017-12-10 DIAGNOSIS — E611 Iron deficiency: Secondary | ICD-10-CM | POA: Diagnosis not present

## 2017-12-10 DIAGNOSIS — K5732 Diverticulitis of large intestine without perforation or abscess without bleeding: Secondary | ICD-10-CM | POA: Diagnosis not present

## 2017-12-10 DIAGNOSIS — F418 Other specified anxiety disorders: Secondary | ICD-10-CM | POA: Diagnosis not present

## 2017-12-12 DIAGNOSIS — R2681 Unsteadiness on feet: Secondary | ICD-10-CM | POA: Diagnosis not present

## 2017-12-12 DIAGNOSIS — K219 Gastro-esophageal reflux disease without esophagitis: Secondary | ICD-10-CM | POA: Diagnosis not present

## 2017-12-12 DIAGNOSIS — I2510 Atherosclerotic heart disease of native coronary artery without angina pectoris: Secondary | ICD-10-CM | POA: Diagnosis not present

## 2017-12-12 DIAGNOSIS — M6281 Muscle weakness (generalized): Secondary | ICD-10-CM | POA: Diagnosis not present

## 2017-12-12 DIAGNOSIS — F418 Other specified anxiety disorders: Secondary | ICD-10-CM | POA: Diagnosis not present

## 2017-12-12 DIAGNOSIS — E785 Hyperlipidemia, unspecified: Secondary | ICD-10-CM | POA: Diagnosis not present

## 2017-12-18 ENCOUNTER — Encounter: Payer: Self-pay | Admitting: Nurse Practitioner

## 2017-12-18 ENCOUNTER — Non-Acute Institutional Stay: Payer: Medicare Other | Admitting: Nurse Practitioner

## 2017-12-18 DIAGNOSIS — I25700 Atherosclerosis of coronary artery bypass graft(s), unspecified, with unstable angina pectoris: Secondary | ICD-10-CM

## 2017-12-18 DIAGNOSIS — R531 Weakness: Secondary | ICD-10-CM

## 2017-12-18 DIAGNOSIS — K219 Gastro-esophageal reflux disease without esophagitis: Secondary | ICD-10-CM | POA: Diagnosis not present

## 2017-12-18 DIAGNOSIS — D509 Iron deficiency anemia, unspecified: Secondary | ICD-10-CM | POA: Diagnosis not present

## 2017-12-18 DIAGNOSIS — K2900 Acute gastritis without bleeding: Secondary | ICD-10-CM

## 2017-12-18 NOTE — Assessment & Plan Note (Signed)
Worsening since onset of nausea/vomiting/diarrhea 2-3 days ago, his wife stated the patient stayed in bed with little oral intake, he is afebrile, denied chest pain, SOB, cough. Will transfer to AL for close observation and labs.

## 2017-12-18 NOTE — Progress Notes (Signed)
Location:   clinic [facility]   Place of Service:  Clinic (12) Provider: Marlana Latus NP  Code Status: DNR Goals of Care: [place] 11/25/2017  Does Patient Have a Medical Advance Directive? Yes  Type of Paramedic of [place];Out of facility DNR (pink MOST or yellow form)  Does patient want to make changes to medical advance directive? No - Patient declined  Copy of [place] in Chart? Yes  Would patient like information on creating a medical advance directive? -  Pre-existing out of facility DNR order (yellow form or pink MOST form) Yellow form placed in chart (order not valid for inpatient use)     Chief Complaint  Patient presents with  . Acute Visit    Nausea and vomiting -sun -thurs,     HPI: Patient is a 84 y.o. male seen today for an acute visit for nausea, vomiting, abd discomfort x 4days, he stated he eat and drinks little, stayed in bed for 2-3 days, afebrile, his weight is down # 5 Ibs since last seen about 3 weeks ago. He has history of malignant pleural effusion, under oncology care, he denied chest pain, SOB today. He denied blood in stool or urine, dysuria, urinary frequency, urgency, or hesitancy. Hx of GERD, on Protonix, Anemia, on Fe and Vit B12  Past Medical History:  Diagnosis Date  . Anxiety   . Bradycardia    a. BB d/c'd in 2016.  [name] CAD (coronary artery disease)    a. 1983 s/p CABG [facility] [place]);  b. 2007 NSTEMI->med Rx for distal LCX dzs; c. 09/2014 NSTEMI/Cath: LIMA->LAD & diag ok, native RCA dominant and patent.   VG->RCA 100, VG->LCX 100 -->Med Rx.  . CKD (chronic kidney disease), stage III ([place])   . Claudication ([place])    BLE  . Depression   . Diastolic dysfunction    a. 10/2014 Echo: F 60-65%, Gr1 DD, mild AS, mild AI, mild to mod MR, PASP 61mmHg.  [name] Dyspnea 2010   syndrome - extensive  . Erectile dysfunction   . GERD (gastroesophageal reflux disease)   . H/O hiatal hernia   . History of blood  transfusion 2014  . Hx of adenomatous colonic polyps 2007  . Hyperlipidemia   . Hypertensive heart disease   . Inguinal hernia   . Iron deficiency anemia   . Lung cancer ([place])   . Shingles 1990's  . Sigmoid diverticulosis   . Upper GI bleeding 2014   Gastritis and possible duodenal ulcer seen on EGD, felt secondary to NSAIDs  . Urethral stricture 11/2008  . Vasovagal syncope 05/2006  . Weight loss     Past Surgical History:  Procedure Laterality Date  . CARDIAC CATHETERIZATION  2007   Left main 100%, RCA 70% ostial, all grafts were patent, EF 40-45 percent, distal circumflex subtotal after graft insertion, medical therapy   . CARDIAC CATHETERIZATION N/A 10/14/2014   Procedure: Left Heart Cath and Cors/Grafts Angiography;  Surgeon: Lorretta Harp, MD;  Location: [facility];  Service: Cardiovascular;  Laterality: N/A;  . CHOLECYSTECTOMY  1980's  . CORONARY ARTERY BYPASS GRAFT  1994    LIMA-LAD, SVG-D1, SVG-OM,  SVG-RCA; performed in [place], [place]  . ESOPHAGOGASTRODUODENOSCOPY N/A 03/25/2013   Procedure: ESOPHAGOGASTRODUODENOSCOPY (EGD);  Surgeon: Missy Sabins, MD;  gastritis, cannot rule out duodenal ulcer   . INGUINAL HERNIA REPAIR Left   . IR THORACENTESIS ASP PLEURAL SPACE W/IMG GUIDE  10/07/2017  . TRANSURETHRAL RESECTION  OF PROSTATE      No Known Allergies  Allergies as of 12/18/2017   No Known Allergies     Medication List        Accurate as of 12/18/17 11:59 PM. Always use your most recent med list.          acetaminophen 500 MG tablet Commonly known as:  TYLENOL Take 1 tablet (500 mg total) by mouth every 6 (six) hours as needed for mild pain or moderate pain.   albuterol 108 (90 Base) MCG/ACT inhaler Commonly known as:  PROAIR HFA Inhale 2 puffs into the lungs every 6 (six) hours as needed for wheezing or shortness of breath.   atorvastatin 40 MG tablet Commonly known as:  LIPITOR Take 1 tablet (40 mg total) by mouth daily at 6 PM.   BREO  ELLIPTA 100-25 MCG/INH Aepb Generic drug:  fluticasone furoate-vilanterol Inhale 1 puff into the lungs daily.   buPROPion 150 MG 12 hr tablet Commonly known as:  WELLBUTRIN SR Take 1 tablet (150 mg total) by mouth daily.   clopidogrel 75 MG tablet Commonly known as:  PLAVIX Take 1 tablet (75 mg total) by mouth daily.   Desvenlafaxine Succinate ER 25 MG Tb24 Take 25 mg by mouth daily.   FERROCITE 324 (106 Fe) MG Tabs tablet Generic drug:  Ferrous Fumarate Take 1 tablet (106 mg of iron total) by mouth daily.   Fish Oil 1000 MG Caps Take 1 capsule (1,000 mg total) by mouth daily.   isosorbide mononitrate 30 MG 24 hr tablet Commonly known as:  IMDUR Take 1 tablet (30 mg total) by mouth daily.   nitroGLYCERIN 0.4 MG SL tablet Commonly known as:  NITROSTAT Place 1 tablet (0.4 mg total) under the tongue every 5 (five) minutes x 3 doses as needed for chest pain.   osimertinib mesylate 80 MG tablet Commonly known as:  TAGRISSO TAKE 1 TABLET (80 MG TOTAL) BY MOUTH DAILY.   OXYGEN Inhale 2 L into the lungs continuous.   pantoprazole 40 MG tablet Commonly known as:  PROTONIX Take 1 tablet (40 mg total) by mouth 2 (two) times daily before a meal.   vitamin B-12 500 MCG tablet Commonly known as:  CYANOCOBALAMIN Take 1 tablet (500 mcg total) by mouth daily. complex      ROS was provided with assistance of wife.  Review of Systems:  Review of Systems  Constitutional: Positive for activity change, appetite change, fatigue and unexpected weight change. Negative for chills, diaphoresis and fever.  HENT: Positive for hearing loss. Negative for congestion and voice change.   Eyes: Negative for visual disturbance.  Respiratory: Positive for cough. Negative for choking, chest tightness, shortness of breath and wheezing.   Cardiovascular: Negative for chest pain, palpitations and leg swelling.  Gastrointestinal: Positive for abdominal pain, diarrhea, nausea and vomiting. Negative for  abdominal distention, anal bleeding, blood in stool and constipation.  Genitourinary: Negative for difficulty urinating, dysuria, frequency, hematuria and urgency.  Musculoskeletal: Positive for gait problem. Negative for joint swelling.  Skin: Negative for color change and pallor.  Neurological: Negative for dizziness, facial asymmetry, speech difficulty, weakness and headaches.       Memory lapses.   Psychiatric/Behavioral: Negative for agitation, behavioral problems, confusion, hallucinations and sleep disturbance. The patient is not nervous/anxious.     Health Maintenance  Topic Date Due  . INFLUENZA VACCINE  12/18/2017  . PNA vac Low Risk Adult (2 of 2 - PCV13) 05/20/2018  . TETANUS/TDAP  07/11/2022  Physical Exam: Vitals:   12/18/17 1357  BP: 122/70  Pulse: 71  Resp: 18  Temp: (!) 97.5 F (36.4 C)  SpO2: 96%  Weight: 147 lb 6.4 oz (66.9 kg)  Height: 5\' 6"  (1.676 m)   Body mass index is 23.79 kg/m. Physical Exam  Constitutional: He appears well-developed and well-nourished. No distress.  HENT:  Head: Normocephalic and atraumatic.  Eyes: Pupils are equal, round, and reactive to light. EOM are normal.  Neck: Normal range of motion. Neck supple. No JVD present. No thyromegaly present.  Cardiovascular: Normal rate and regular rhythm.  No murmur heard. Pulmonary/Chest: Effort normal. He has no wheezes. He has no rales.  Decreased air entry posterior left mid to lower lungs.   Abdominal: Soft. Bowel sounds are normal. He exhibits no distension. There is no tenderness. There is no rebound and no guarding.  Slightly diffused discomfort when palpated.   Musculoskeletal: Normal range of motion. He exhibits no edema or deformity.  In w/c today  Neurological: He is alert. No cranial nerve deficit. He exhibits normal muscle tone. Coordination normal.  Oriented to person and place.   Skin: Skin is warm and dry. He is not diaphoretic. No erythema. No pallor.  Psychiatric: He  has a normal mood and affect. His behavior is normal.    Labs reviewed: Basic Metabolic Panel: Recent Labs    05/23/17 0947  06/20/17 1054  08/01/17 1106 08/14/17 1434  10/07/17 0351  10/20/17 0842 10/21/17 11/18/17 0924 12/19/17  NA  --    < > 140   < > 142 140   < > 141   < > 141 141 142 139  K  --    < > 5.4*   < > 4.5 4.7   < > 4.4   < > 4.5 4.1 4.1 4.2  CL  --   --  107   < > 109 106   < > 108   < > 106 108 107 106  CO2  --    < > 26   < > 25 27   < > 22   < > 26 25 28 26   GLUCOSE  --    < > 104  --  65* 96   < > 96  --  88  --  102*  --   BUN  --    < > 35*   < > 26 31*   < > 33*   < > 26 29* 31* 32*  CREATININE  --    < > 2.30*   < > 1.69* 1.76*   < > 1.77*   < > 1.92* 1.8* 1.78* 1.7*  CALCIUM  --    < > 9.3   < > 9.3 9.3   < > 9.2   < > 9.4 9.0 8.8* 9.1  MG  --    < > 3.0*  --  2.4  --   --   --   --   --   --  2.3  --   PHOS 3.7  --  3.4  --   --   --   --   --   --   --   --   --   --   TSH  --   --   --   --   --  2.530  --   --   --   --   --   --   --    < > =  values in this interval not displayed.   Liver Function Tests: Recent Labs    09/15/17 0924 10/20/17 0842 10/21/17 11/18/17 0924 12/19/17  AST 31 21 17 21  65*  ALT 28 20 17 20  82*  ALKPHOS 75 78 67 72 87  BILITOT 0.2 0.4  --  0.4  --   PROT 7.0 6.5  --  6.4* 5.9  ALBUMIN 3.3* 3.3* 3.4 3.4* 3.7   Recent Labs    04/09/17 1352  LIPASE 48   No results for input(s): AMMONIA in the last 8760 hours. CBC: Recent Labs    09/15/17 0924  10/08/17 0321  10/20/17 0842  11/04/17 11/18/17 0924 12/19/17  WBC 5.8   < > 7.5   < > 7.2   < > 5.7 5.7 4.8  NEUTROABS 4.0  --   --   --  5.2  --   --  4.0  --   HGB 12.1*   < > 11.5*   < > 12.5*   < > 10.9* 11.6* 11.5*  HCT 38.5   < > 36.9*   < > 39.8   < > 33* 37.3* 36*  MCV 89.5   < > 88.3  --  88.4  --   --  88.2  --   PLT 153   < > 157   < > 141   < > 114* 116* 121*   < > = values in this interval not displayed.   Lipid Panel: No results for input(s): CHOL,  HDL, LDLCALC, TRIG, CHOLHDL, LDLDIRECT in the last 8760 hours. No results found for: HGBA1C  Procedures since last visit: No results found.  Assessment/Plan Acute gastritis Nausea, vomiting, diarrhea, abd discomfort about 2 -3 days, will obtain CBC/diff, CMP in [facility], will have prn phenergan 12.5mg  po q6h, Imodium 2mg  II after 1st diarrhea, then I after each diarrhea, total 16mg /24 hours available x 48 hours. Observe.   Generalized weakness Worsening since onset of nausea/vomiting/diarrhea 2-3 days ago, his wife stated the patient stayed in bed with little oral intake, he is afebrile, denied chest pain, SOB, cough. Will transfer to [place] for close observation and labs.   GERD (gastroesophageal reflux disease) Managed on Prontonix bid.   Anemia Currently he is taking Vit B12 and Fe, last Hgb 11.6 11/18/17, will update CBC    Labs/tests ordered: CBC/diff, CMP  Next appt:  02/24/2018

## 2017-12-18 NOTE — Assessment & Plan Note (Signed)
Currently he is taking Vit B12 and Fe, last Hgb 11.6 11/18/17, will update CBC

## 2017-12-18 NOTE — Patient Instructions (Signed)
Transfer to [facility] for infirmary stay for observation, workup, and possible treatment.

## 2017-12-18 NOTE — Assessment & Plan Note (Signed)
Managed on Prontonix bid.

## 2017-12-18 NOTE — Assessment & Plan Note (Signed)
Nausea, vomiting, diarrhea, abd discomfort about 2 -3 days, will obtain CBC/diff, CMP in [facility], will have prn phenergan 12.5mg  po q6h, Imodium 2mg  II after 1st diarrhea, then I after each diarrhea, total 16mg /24 hours available x 48 hours. Observe.

## 2017-12-19 ENCOUNTER — Other Ambulatory Visit: Payer: Self-pay | Admitting: *Deleted

## 2017-12-19 DIAGNOSIS — R5381 Other malaise: Secondary | ICD-10-CM | POA: Diagnosis not present

## 2017-12-19 DIAGNOSIS — R319 Hematuria, unspecified: Secondary | ICD-10-CM | POA: Diagnosis not present

## 2017-12-19 DIAGNOSIS — I10 Essential (primary) hypertension: Secondary | ICD-10-CM | POA: Diagnosis not present

## 2017-12-19 DIAGNOSIS — D509 Iron deficiency anemia, unspecified: Secondary | ICD-10-CM | POA: Diagnosis not present

## 2017-12-19 DIAGNOSIS — N183 Chronic kidney disease, stage 3 (moderate): Secondary | ICD-10-CM | POA: Diagnosis not present

## 2017-12-19 LAB — HEPATIC FUNCTION PANEL
ALK PHOS: 87 (ref 25–125)
ALT: 82 — AB (ref 10–40)
AST: 65 — AB (ref 14–40)
BILIRUBIN, TOTAL: 0.4

## 2017-12-19 LAB — CBC AND DIFFERENTIAL
HEMATOCRIT: 36 — AB (ref 41–53)
HEMOGLOBIN: 11.5 — AB (ref 13.5–17.5)
Platelets: 121 — AB (ref 150–399)
WBC: 4.8

## 2017-12-19 LAB — COMPLETE METABOLIC PANEL WITH GFR
ALBUMIN: 3.7
CALCIUM: 9.1
CO2: 26
Chloride: 106
EGFR (Non-African Amer.): 34
GLOBULIN: 2.2
Total Protein: 5.9 g/dL

## 2017-12-19 LAB — BASIC METABOLIC PANEL
BUN: 32 — AB (ref 4–21)
Creatinine: 1.7 — AB (ref <=1.3)
GLUCOSE: 88
POTASSIUM: 4.2 (ref 3.4–5.3)
SODIUM: 139 (ref 137–147)

## 2017-12-23 ENCOUNTER — Telehealth: Payer: Self-pay

## 2017-12-23 ENCOUNTER — Telehealth: Payer: Self-pay | Admitting: Hematology

## 2017-12-23 NOTE — Telephone Encounter (Signed)
Spoke to patient wife concerning upcoming appointment tomorrow for labs and f/u visit schedule for Wensday @1pm  . Per 8/6 sch msg

## 2017-12-23 NOTE — Telephone Encounter (Signed)
Patient wife left message re patient being weak and needing appointment/order to having fluid drained from lung. Per wife it's been 4 weeks since he last had it done. Message forwarded to desk nurse.

## 2017-12-23 NOTE — Telephone Encounter (Signed)
Patient's wife called stating that patient has increased weakness and she wants to know if a thoracentesis can be scheduled. Questioned wife if patient is having severe shortness of breath and she stated no he is not. Dr. Irene Limbo made aware and moved patient's appointment from 01/13/18 to 12/24/17. Patient's wife aware and verbalized understanding.

## 2017-12-24 ENCOUNTER — Inpatient Hospital Stay: Payer: Medicare Other

## 2017-12-24 ENCOUNTER — Telehealth: Payer: Self-pay

## 2017-12-24 ENCOUNTER — Ambulatory Visit (HOSPITAL_COMMUNITY)
Admission: RE | Admit: 2017-12-24 | Discharge: 2017-12-24 | Disposition: A | Discharge status: Home / Self Care | Payer: Medicare Other | Source: Ambulatory Visit | Attending: Hematology | Admitting: Hematology

## 2017-12-24 ENCOUNTER — Inpatient Hospital Stay: Payer: Medicare Other | Attending: Hematology | Admitting: Hematology

## 2017-12-24 VITALS — BP 128/61 | HR 70 | Temp 97.7°F | Resp 17 | Ht 66.0 in | Wt 148.0 lb

## 2017-12-24 DIAGNOSIS — C3412 Malignant neoplasm of upper lobe, left bronchus or lung: Secondary | ICD-10-CM

## 2017-12-24 DIAGNOSIS — I2510 Atherosclerotic heart disease of native coronary artery without angina pectoris: Secondary | ICD-10-CM | POA: Diagnosis not present

## 2017-12-24 DIAGNOSIS — J910 Malignant pleural effusion: Secondary | ICD-10-CM

## 2017-12-24 DIAGNOSIS — Z9981 Dependence on supplemental oxygen: Secondary | ICD-10-CM | POA: Diagnosis not present

## 2017-12-24 DIAGNOSIS — I252 Old myocardial infarction: Secondary | ICD-10-CM | POA: Diagnosis not present

## 2017-12-24 DIAGNOSIS — J90 Pleural effusion, not elsewhere classified: Secondary | ICD-10-CM

## 2017-12-24 DIAGNOSIS — R5383 Other fatigue: Secondary | ICD-10-CM

## 2017-12-24 DIAGNOSIS — K219 Gastro-esophageal reflux disease without esophagitis: Secondary | ICD-10-CM | POA: Insufficient documentation

## 2017-12-24 DIAGNOSIS — Z951 Presence of aortocoronary bypass graft: Secondary | ICD-10-CM | POA: Diagnosis not present

## 2017-12-24 DIAGNOSIS — I5032 Chronic diastolic (congestive) heart failure: Secondary | ICD-10-CM | POA: Diagnosis not present

## 2017-12-24 DIAGNOSIS — R630 Anorexia: Secondary | ICD-10-CM | POA: Insufficient documentation

## 2017-12-24 DIAGNOSIS — Z79899 Other long term (current) drug therapy: Secondary | ICD-10-CM | POA: Insufficient documentation

## 2017-12-24 DIAGNOSIS — Z7902 Long term (current) use of antithrombotics/antiplatelets: Secondary | ICD-10-CM | POA: Insufficient documentation

## 2017-12-24 DIAGNOSIS — I130 Hypertensive heart and chronic kidney disease with heart failure and stage 1 through stage 4 chronic kidney disease, or unspecified chronic kidney disease: Secondary | ICD-10-CM | POA: Insufficient documentation

## 2017-12-24 DIAGNOSIS — E785 Hyperlipidemia, unspecified: Secondary | ICD-10-CM | POA: Diagnosis not present

## 2017-12-24 DIAGNOSIS — R0602 Shortness of breath: Secondary | ICD-10-CM | POA: Diagnosis not present

## 2017-12-24 DIAGNOSIS — N183 Chronic kidney disease, stage 3 (moderate): Secondary | ICD-10-CM | POA: Diagnosis not present

## 2017-12-24 DIAGNOSIS — R918 Other nonspecific abnormal finding of lung field: Secondary | ICD-10-CM | POA: Insufficient documentation

## 2017-12-24 LAB — CMP (CANCER CENTER ONLY)
ALK PHOS: 81 U/L (ref 38–126)
ALT: 52 U/L — AB (ref 0–44)
AST: 40 U/L (ref 15–41)
Albumin: 3.5 g/dL (ref 3.5–5.0)
Anion gap: 10 (ref 5–15)
BILIRUBIN TOTAL: 0.4 mg/dL (ref 0.3–1.2)
BUN: 37 mg/dL — AB (ref 8–23)
CALCIUM: 9.3 mg/dL (ref 8.9–10.3)
CO2: 26 mmol/L (ref 22–32)
CREATININE: 2.03 mg/dL — AB (ref 0.61–1.24)
Chloride: 107 mmol/L (ref 98–111)
GFR, Est AFR Am: 31 mL/min — ABNORMAL LOW (ref >60)
GFR, Estimated: 27 mL/min — ABNORMAL LOW (ref >60)
GLUCOSE: 55 mg/dL — AB (ref 70–99)
Potassium: 4.6 mmol/L (ref 3.5–5.1)
Sodium: 143 mmol/L (ref 135–145)
TOTAL PROTEIN: 7 g/dL (ref 6.5–8.1)

## 2017-12-24 LAB — RETICULOCYTES
RBC.: 4.39 MIL/uL (ref 4.20–5.82)
Retic Count, Absolute: 43.9 10*3/uL (ref 34.8–93.9)
Retic Ct Pct: 1 % (ref 0.8–1.8)

## 2017-12-24 LAB — CBC WITH DIFFERENTIAL/PLATELET
Basophils Absolute: 0 10*3/uL (ref 0.0–0.1)
Basophils Relative: 0 %
EOS PCT: 3 %
Eosinophils Absolute: 0.3 10*3/uL (ref 0.0–0.5)
HEMATOCRIT: 38.3 % — AB (ref 38.4–49.9)
Hemoglobin: 12 g/dL — ABNORMAL LOW (ref 13.0–17.1)
LYMPHS ABS: 1.8 10*3/uL (ref 0.9–3.3)
Lymphocytes Relative: 21 %
MCH: 27.3 pg (ref 27.2–33.4)
MCHC: 31.3 g/dL — ABNORMAL LOW (ref 32.0–36.0)
MCV: 87.2 fL (ref 79.3–98.0)
MONO ABS: 1 10*3/uL — AB (ref 0.1–0.9)
Monocytes Relative: 11 %
NEUTROS ABS: 5.5 10*3/uL (ref 1.5–6.5)
Neutrophils Relative %: 65 %
PLATELETS: 126 10*3/uL — AB (ref 140–400)
RBC: 4.39 MIL/uL (ref 4.20–5.82)
RDW: 15 % — AB (ref 11.0–14.6)
WBC: 8.6 10*3/uL (ref 4.0–10.3)

## 2017-12-24 NOTE — Telephone Encounter (Signed)
Spoke to patient's granddaughter and made her aware of patient's appointment for a Thoracentesis at 2:00pm 12/25/2017 at [facility].

## 2017-12-24 NOTE — Telephone Encounter (Signed)
Printed avs and calender of upcoming appointment.per  8/7n los

## 2017-12-24 NOTE — Progress Notes (Signed)
HEMATOLOGY/ONCOLOGY CLINIC NOTE  Date of Service: 12/24/17    Patient Care Team: [name], MD as PCP - General (Internal Medicine) Jettie Booze, MD as PCP - Cardiology (Cardiology) Mast, [name], NP as Nurse Practitioner (Internal Medicine) [facility] as Nurse Practitioner (Hospice and Palliative Medicine)  CHIEF COMPLAINTS/PURPOSE OF CONSULTATION:   F/u for EGFR mutated metastatic lung adenocarcinoma  HISTORY OF PRESENTING ILLNESS:   Scott [name] is a wonderful 84 y.o. male who has been referred to [place] by Dr. [name], MD for evaluation of suspected lung cancer.   He has a hx of CAD, CKD, Diastolic CHF, HTN, HLD. He presented to the ED on 03/03/2017 for SOB and was found to have pleural effusion. He was also to have a mass in the upper lobe of left lung after a CT chest Wo contrast with results showing:IMPRESSION:1. 2.4 x 3.6 cm apparent mass in the posterior left upper lobe with associated left upper lobe interlobular septal thickening. Imaging features concerning for neoplasm with lymphangitic tumor spread. 2. Small left pleural effusion. 3. Large hiatal hernia contains nearly the entire stomach with evidence of organoaxial volvulus. No evidence for gastric obstruction. 4. 19 mm right adrenal adenoma.  He is accompanied by his wife and daughter. He states that he began feeling SOB 2 weeks ago. He is not a smoker and has not smoked in the past. He states that he has not been evaluated by his cardiologist, Dr. Larae Grooms. For his recent SOB.  On review of systems, patient reports fatigue, intermittent SOB, and denies fever, chills, CP, cough, weight loss, and any other accompanying symptoms.  INTERVAL HISTORY:   Scott [name] is here for f/u of his EGFR mutated lung adenocarcinoma. The patient's last visit with [place] was on 11/18/17. He is accompanied today by his wife and daughter. The pt reports that he is doing well overall.   The pt's  wife reports that the pt had some "bad days" last week where he didn't get out of bed much and didn't display much of an appetite. She reports that he began getting better over the last week and has had a better appetite.   The pt notes that he believes he could benefit from having more fluid removed from around his lungs.   Lab results today (12/24/17) of CBC w/diff, CMP, and Reticulocytes is as follows: all values are WNL except for HGB at 12.0, HCT at 38.3, MCHC at 31.3, RDW at 15.0, PLT at 126k, Monocytes abs at 1.0k, Gluocse at 55, BUN at 37, Creatinine at 2.03, ALT at 52, GFR at 27.  On review of systems, pt reports more fatigue, eating well, and denies CP, fevers, chills, night sweats, SOB, leg swelling, and any other symptoms.   MEDICAL HISTORY:  Past Medical History:  Diagnosis Date  . Anxiety   . Bradycardia    a. BB d/c'd in 2016.  [name] CAD (coronary artery disease)    a. 1983 s/p CABG [facility], [place]);  b. 2007 NSTEMI->med Rx for distal LCX dzs; c. 09/2014 NSTEMI/Cath: LIMA->LAD & diag ok, native RCA dominant and patent.   VG->RCA 100, VG->LCX 100 -->Med Rx.  . CKD (chronic kidney disease), stage III ([place])   . Claudication ([place])    BLE  . Depression   . Diastolic dysfunction    a. 10/2014 Echo: F 60-65%, Gr1 DD, mild AS, mild AI, mild to mod MR, PASP 63mHg.  .Marland KitchenDyspnea 2010   syndrome -  extensive  . Erectile dysfunction   . GERD (gastroesophageal reflux disease)   . H/O hiatal hernia   . History of blood transfusion 2014  . Hx of adenomatous colonic polyps 2007  . Hyperlipidemia   . Hypertensive heart disease   . Inguinal hernia   . Iron deficiency anemia   . Lung cancer ([place])   . Shingles 1990's  . Sigmoid diverticulosis   . Upper GI bleeding 2014   Gastritis and possible duodenal ulcer seen on EGD, felt secondary to NSAIDs  . Urethral stricture 11/2008  . Vasovagal syncope 05/2006  . Weight loss     SURGICAL HISTORY: Past Surgical History:  Procedure Laterality Date    . CARDIAC CATHETERIZATION  2007   Left main 100%, RCA 70% ostial, all grafts were patent, EF 40-45 percent, distal circumflex subtotal after graft insertion, medical therapy   . CARDIAC CATHETERIZATION N/A 10/14/2014   Procedure: Left Heart Cath and Cors/Grafts Angiography;  Surgeon: Lorretta Harp, MD;  Location: [facility];  Service: Cardiovascular;  Laterality: N/A;  . CHOLECYSTECTOMY  1980's  . CORONARY ARTERY BYPASS GRAFT  1994    LIMA-LAD, SVG-D1, SVG-OM,  SVG-RCA; performed in [place], [place]  . ESOPHAGOGASTRODUODENOSCOPY N/A 03/25/2013   Procedure: ESOPHAGOGASTRODUODENOSCOPY (EGD);  Surgeon: Missy Sabins, MD;  gastritis, cannot rule out duodenal ulcer   . INGUINAL HERNIA REPAIR Left   . IR THORACENTESIS ASP PLEURAL SPACE W/IMG GUIDE  10/07/2017  . TRANSURETHRAL RESECTION OF PROSTATE      SOCIAL HISTORY: Social History   Socioeconomic History  . Marital status: Married    Spouse name: Not on file  . Number of children: Not on file  . Years of education: Not on file  . Highest education level: Not on file  Occupational History  . Occupation: Retired  Scientific laboratory technician  . Financial resource strain: Not hard at all  . Food insecurity:    Worry: Never true    Inability: Never true  . Transportation needs:    Medical: No    Non-medical: No  Tobacco Use  . Smoking status: Never Smoker  . Smokeless tobacco: Never Used  Substance and Sexual Activity  . Alcohol use: Not Currently    Alcohol/week: 4.2 oz    Types: 7 Glasses of wine per week    Comment: wine - daily - currently not drinking  . Drug use: No  . Sexual activity: Never    Birth control/protection: Abstinence  Lifestyle  . Physical activity:    Days per week: 0 days    Minutes per session: 0 min  . Stress: Not at all  Relationships  . Social connections:    Talks on phone: More than three times a week    Gets together: More than three times a week    Attends religious service: Never    Active  member of club or organization: No    Attends meetings of clubs or organizations: Never    Relationship status: Married  . Intimate partner violence:    Fear of current or ex partner: No    Emotionally abused: No    Physically abused: No    Forced sexual activity: No  Other Topics Concern  . Not on file  Social History Narrative   He lives in [facility], independent living, with his wife.    FAMILY HISTORY: Family History  Problem Relation Age of Onset  . Hypertension Mother   . Heart disease Mother   . Heart  disease Father   . Heart failure Brother   . Heart failure Sister   . Heart failure Brother   . Heart attack Neg Hx   . Stroke Neg Hx     ALLERGIES:  has No Known Allergies.  MEDICATIONS:  Current Outpatient Medications  Medication Sig Dispense Refill  . acetaminophen (TYLENOL) 500 MG tablet Take 1 tablet (500 mg total) by mouth every 6 (six) hours as needed for mild pain or moderate pain. 30 tablet 2  . albuterol (PROAIR HFA) 108 (90 Base) MCG/ACT inhaler Inhale 2 puffs into the lungs every 6 (six) hours as needed for wheezing or shortness of breath. 18 g 2  . atorvastatin (LIPITOR) 40 MG tablet Take 1 tablet (40 mg total) by mouth daily at 6 PM. 90 tablet 2  . BREO ELLIPTA 100-25 MCG/INH AEPB Inhale 1 puff into the lungs daily. 28 each 2  . buPROPion (WELLBUTRIN SR) 150 MG 12 hr tablet Take 1 tablet (150 mg total) by mouth daily. 30 tablet 2  . clopidogrel (PLAVIX) 75 MG tablet Take 1 tablet (75 mg total) by mouth daily. 30 tablet 0  . Desvenlafaxine Succinate ER 25 MG TB24 Take 25 mg by mouth daily. 30 tablet 2  . FERROCITE 324 MG TABS tablet Take 1 tablet (106 mg of iron total) by mouth daily. 90 tablet 2  . isosorbide mononitrate (IMDUR) 30 MG 24 hr tablet Take 1 tablet (30 mg total) by mouth daily. 30 tablet 2  . nitroGLYCERIN (NITROSTAT) 0.4 MG SL tablet Place 1 tablet (0.4 mg total) under the tongue every 5 (five) minutes x 3 doses as needed for chest pain. 25  tablet 4  . Omega-3 Fatty Acids (FISH OIL) 1000 MG CAPS Take 1 capsule (1,000 mg total) by mouth daily. 90 capsule 0  . osimertinib mesylate (TAGRISSO) 80 MG tablet TAKE 1 TABLET (80 MG TOTAL) BY MOUTH DAILY. 30 tablet 3  . OXYGEN Inhale 2 L into the lungs continuous.    . pantoprazole (PROTONIX) 40 MG tablet Take 1 tablet (40 mg total) by mouth 2 (two) times daily before a meal. 60 tablet 3  . vitamin B-12 (CYANOCOBALAMIN) 500 MCG tablet Take 1 tablet (500 mcg total) by mouth daily. complex 30 tablet 2   No current facility-administered medications for this visit.     REVIEW OF SYSTEMS:    A 10+ POINT REVIEW OF SYSTEMS WAS OBTAINED including neurology, dermatology, psychiatry, cardiac, respiratory, lymph, extremities, GI, GU, Musculoskeletal, constitutional, breasts, reproductive, HEENT.  All pertinent positives are noted in the HPI.  All others are negative.   PHYSICAL EXAMINATION:  ECOG PERFORMANCE STATUS: 2 - Symptomatic, <50% confined to bed  .BP 128/61 (BP Location: Left Arm, Patient Position: Sitting)   Pulse 70   Temp 97.7 F (36.5 C) (Oral)   Resp 17   Ht '5\' 6"'$  (1.676 m)   Wt 148 lb (67.1 kg)   SpO2 95%   BMI 23.89 kg/m   GENERAL:alert, in no acute distress and comfortable SKIN: no acute rashes, no significant lesions EYES: conjunctiva are pink and non-injected, sclera anicteric OROPHARYNX: MMM, no exudates, no oropharyngeal erythema or ulceration NECK: supple, no JVD LYMPH:  no palpable lymphadenopathy in the cervical, axillary or inguinal regions LUNGS: left lung base decreased breath sounds up one half posterior chest wall HEART: regular rate & rhythm ABDOMEN:  normoactive bowel sounds , non tender, not distended. No palpable hepatosplenomegaly.  Extremity: trace pedal edema PSYCH: alert & oriented x 3 with  fluent speech NEURO: no focal motor/sensory deficits     LABORATORY DATA:  I have reviewed the data as listed  . CBC Latest Ref Rng & Units 12/24/2017  12/19/2017 11/18/2017  WBC 4.0 - 10.3 K/uL 8.6 4.8 5.7  Hemoglobin 13.0 - 17.1 g/dL 12.0(L) 11.5(A) 11.6(L)  Hematocrit 38.4 - 49.9 % 38.3(L) 36(A) 37.3(L)  Platelets 140 - 400 K/uL 126(L) 121(A) 116(L)  HGB 12.2  . CMP Latest Ref Rng & Units 12/24/2017 12/19/2017 11/18/2017  Glucose 70 - 99 mg/dL 55(L) - 102(H)  BUN 8 - 23 mg/dL 37(H) 32(A) 31(H)  Creatinine 0.61 - 1.24 mg/dL 2.03(H) 1.7(A) 1.78(H)  Sodium 135 - 145 mmol/L 143 139 142  Potassium 3.5 - 5.1 mmol/L 4.6 4.2 4.1  Chloride 98 - 111 mmol/L 107 106 107  CO2 22 - 32 mmol/L _0 Calcium 8.9 - 10.3 mg/dL 9.3 9.1 8.8(L)  Total Protein 6.5 - 8.1 g/dL 7.0 5.9 6.4(L)  Total Bilirubin 0.3 - 1.2 mg/dL 0.4 - 0.4  Alkaline Phos 38 - 126 U/L 81 87 72  AST 15 - 41 U/L 40 65(A) 21  ALT 0 - 44 U/L 52(H) 82(A) 20   Component     Latest Ref Rng & Units 08/14/2017  TSH     0.450 - 4.500 uIU/mL 2.530  Thyroxine (T4)     4.5 - 12.0 ug/dL 6.5  T3 Uptake Ratio     24 - 39 % 26  Free Thyroxine Index     1.2 - 4.9 1.7     RADIOGRAPHIC STUDIES: I have personally reviewed the radiological images as listed and agreed with the findings in the report. No results found.  ASSESSMENT & PLAN:   Scott [name] is a wonderful 84 y.o. male with  1) Metastatic Left lung Adenocarcinoma involving LUL lung mass left lung with associated moderated malignant left pleural effusion and left hilar LNadenopathy. EGFR mutated lung adenocarcinoma 10/14/17 CT with pt and family which revealed The hypermetabolic left upper lobe lesion has decreased in size from the PET-CT of 03/17/2017. Scattered small right lung nodules are stable. No evidence of disease progression.   2) DOE improved s/p recent therapeutic thoracentesis. But rpt CXR today still shows significant left pleural effusion which on recent decubitus XR was noted to be free flowing.  3) Grade 1-2 fatigue -- multifactorial Age+ significant burden of medical issues + SOE/DOE from pleural effusion +  fatigue from medication. Much improved with dexamethasone and after recent therapeutic thoracentesis.  PLAN: -Heart and CKD are likely also at play with pleural effusions -Recommended that the pt continue optimizing fluid status with cardiologist -Pleurex catheter not recommended at this time given about-monthly frequency of draining, desire to prevent infections, and with reasonable control of fluid status - pt agrees with this.  -Discussed pt labwork today, 12/24/17; HGB improved to 12.0, PLT stable at 126k, Creatinine at 2.03 -The pt has no prohibitive toxicities from continuing [place] at this time.   -Ordering CXR today -US guided thoracentesis   3) Grade 1-2 Fatigue - multifactorial -- improved some. Component     Latest Ref Rng & Units 08/14/2017  TSH     0.450 - 4.500 uIU/mL 2.530  Thyroxine (T4)     4.5 - 12.0 ug/dL 6.5  T3 Uptake Ratio     24 - 39 % 26  Free Thyroxine Index     1.2 - 4.9 1.7   -thyroid function tests WNL -optimizing nutrition -maintain active lifestyle.  4) Anorexia. -improved with low dose dexamethasone. -encouraged to continue good po intake. -Advised with the patientto continue to use boost or ensure as a nutritional supplement.    CXR today US guided thoracentesis tomorrow  CT chest (ordered previously) --needs to be scheduled in 3 weeks Plz move appointment with Dr Irene Limbo from 8/27 to 4 weeks out from today with labs    All of the patients questions were answered with apparent satisfaction. The patient knows to call the clinic with any problems, questions or concerns.  The total time spent in the appt was 25 minutes and more than 50% was on counseling and direct patient cares.     Sullivan Lone MD MS AAHIVMS [facility] Hematology/Oncology Physician [facility]  (Office):       [phone] (Work cell):  [phone] (Fax):           [phone]  I, Baldwin [place], am acting as a scribe for Dr. Irene Limbo  .I have reviewed the  above documentation for accuracy and completeness, and I agree with the above. Brunetta Genera MD

## 2017-12-25 ENCOUNTER — Ambulatory Visit (HOSPITAL_COMMUNITY)
Admission: RE | Admit: 2017-12-25 | Discharge: 2017-12-25 | Disposition: A | Discharge status: Home / Self Care | Payer: Medicare Other | Source: Ambulatory Visit | Attending: Radiology | Admitting: Radiology

## 2017-12-25 ENCOUNTER — Ambulatory Visit (HOSPITAL_COMMUNITY)
Admission: RE | Admit: 2017-12-25 | Discharge: 2017-12-25 | Disposition: A | Discharge status: Home / Self Care | Payer: Medicare Other | Source: Ambulatory Visit | Attending: Hematology | Admitting: Hematology

## 2017-12-25 DIAGNOSIS — K449 Diaphragmatic hernia without obstruction or gangrene: Secondary | ICD-10-CM | POA: Diagnosis not present

## 2017-12-25 DIAGNOSIS — Z9889 Other specified postprocedural states: Secondary | ICD-10-CM | POA: Diagnosis not present

## 2017-12-25 DIAGNOSIS — J811 Chronic pulmonary edema: Secondary | ICD-10-CM | POA: Diagnosis not present

## 2017-12-25 DIAGNOSIS — C3412 Malignant neoplasm of upper lobe, left bronchus or lung: Secondary | ICD-10-CM | POA: Insufficient documentation

## 2017-12-25 DIAGNOSIS — J90 Pleural effusion, not elsewhere classified: Secondary | ICD-10-CM | POA: Diagnosis not present

## 2017-12-25 DIAGNOSIS — J910 Malignant pleural effusion: Secondary | ICD-10-CM | POA: Diagnosis not present

## 2017-12-25 DIAGNOSIS — C3490 Malignant neoplasm of unspecified part of unspecified bronchus or lung: Secondary | ICD-10-CM | POA: Diagnosis not present

## 2017-12-25 MED ORDER — LIDOCAINE HCL 1 % IJ SOLN
INTRAMUSCULAR | Status: AC
  Filled 2017-12-25: qty 10

## 2017-12-25 MED FILL — TAGRISSO 80 MG TABLET: 80 | 30 days supply | Qty: 30 | Fill #2

## 2017-12-25 NOTE — Procedures (Signed)
Ultrasound-guided diagnostic and therapeutic left thoracentesis performed yielding 1.2 liters of slightly hazy, yellow fluid. No immediate complications. Follow-up chest x-ray pending. Due to pt chest/left shoulder discomfort only the above amount of fluid was removed today.

## 2017-12-29 ENCOUNTER — Telehealth: Payer: Self-pay

## 2017-12-29 NOTE — Telephone Encounter (Signed)
Please make appointment for patient to be seen in clinic tomorrow for headache and stomach ache

## 2017-12-29 NOTE — Telephone Encounter (Signed)
Patient to be evaluated by Adalberto Cole, RN [place] to see how the patient is doing at present. Waiting on a call back.

## 2017-12-29 NOTE — Telephone Encounter (Signed)
Patient's daughter called to say that patient has not been feeling well for over 2 days. He has had a severe headache and stomach ache. She would also like to have a palliative care referral placed.    Please contact patient's daughter, Cheyenne Schumm, at [phone].

## 2017-12-29 NOTE — Telephone Encounter (Signed)
Spoke with the patient's granddaughter is she is aware of the patient's appointment for tomorrow at 10 am.

## 2017-12-30 ENCOUNTER — Encounter: Payer: Self-pay | Admitting: Internal Medicine

## 2017-12-30 ENCOUNTER — Non-Acute Institutional Stay: Payer: Medicare Other | Admitting: Internal Medicine

## 2017-12-30 ENCOUNTER — Non-Acute Institutional Stay: Payer: Medicare Other | Admitting: Nurse Practitioner

## 2017-12-30 VITALS — BP 132/62 | HR 82 | Temp 97.5°F | Resp 20 | Ht 66.0 in | Wt 152.6 lb

## 2017-12-30 VITALS — BP 130/78 | HR 79 | Resp 22 | Wt 150.0 lb

## 2017-12-30 DIAGNOSIS — Z7189 Other specified counseling: Secondary | ICD-10-CM | POA: Diagnosis not present

## 2017-12-30 DIAGNOSIS — Z9189 Other specified personal risk factors, not elsewhere classified: Secondary | ICD-10-CM | POA: Diagnosis not present

## 2017-12-30 DIAGNOSIS — R0609 Other forms of dyspnea: Secondary | ICD-10-CM

## 2017-12-30 DIAGNOSIS — C3412 Malignant neoplasm of upper lobe, left bronchus or lung: Secondary | ICD-10-CM | POA: Diagnosis not present

## 2017-12-30 DIAGNOSIS — I25700 Atherosclerosis of coronary artery bypass graft(s), unspecified, with unstable angina pectoris: Secondary | ICD-10-CM | POA: Diagnosis not present

## 2017-12-30 DIAGNOSIS — C3490 Malignant neoplasm of unspecified part of unspecified bronchus or lung: Secondary | ICD-10-CM | POA: Diagnosis not present

## 2017-12-30 DIAGNOSIS — Z9981 Dependence on supplemental oxygen: Secondary | ICD-10-CM | POA: Diagnosis not present

## 2017-12-30 DIAGNOSIS — I5032 Chronic diastolic (congestive) heart failure: Secondary | ICD-10-CM | POA: Diagnosis not present

## 2017-12-30 DIAGNOSIS — Z515 Encounter for palliative care: Secondary | ICD-10-CM

## 2017-12-30 DIAGNOSIS — J90 Pleural effusion, not elsewhere classified: Secondary | ICD-10-CM

## 2017-12-30 DIAGNOSIS — K219 Gastro-esophageal reflux disease without esophagitis: Secondary | ICD-10-CM | POA: Diagnosis not present

## 2017-12-30 DIAGNOSIS — R269 Unspecified abnormalities of gait and mobility: Secondary | ICD-10-CM | POA: Diagnosis not present

## 2017-12-30 DIAGNOSIS — R0689 Other abnormalities of breathing: Secondary | ICD-10-CM | POA: Diagnosis not present

## 2017-12-30 DIAGNOSIS — R627 Adult failure to thrive: Secondary | ICD-10-CM

## 2017-12-30 DIAGNOSIS — R0600 Dyspnea, unspecified: Secondary | ICD-10-CM | POA: Diagnosis not present

## 2017-12-30 NOTE — Progress Notes (Addendum)
PALLIATIVE CARE CONSULT VISIT   PATIENT NAME: Scott [name] DOB: [birth year] MRN: [id number]  PRIMARY CARE PROVIDER:   [name], MD  REFERRING PROVIDER:  [name], MD [place], [place] [address]  RESPONSIBLE PARTY:     ASSESSMENt/RECOMMENDATIONS and PLAN:  Metastatic lung Cancer with recurrent left pleural effusion s/p thoracentesis  -On tagrisso for lung CA  -Oxygen dependence/intermitten shortness of breath and wheezing -Patient reports much improvement of respiratory status since thoracentesis last week -CT with some tumor shrinkage and no disease progression -appt with oncology next month with repeat imaging -on 2l/min O2 ATC -reinforced use and demonstrated use of PRN INH  Gait disturbance/fatigue/exercise intolerance/ anorexia -uses seated rolling walker -fatigue, DOE have improved since thoracentesis -continue dietary supplements -monthly weight 8/7 148 lb  ACP -Patient has DNR-limited and MOST    I spent 30 minutes providing this consultation,  from 12:30 to 13:00. More than 50% of the time in this consultation was spent coordinating communication.   HISTORY OF PRESENT ILLNESS: Scott [name] is a 84 y.o.  male with multiple medical problems including metastatic lung adenocarcinoma, recurrent malignant pleural effusion, non small cell lung cancer, CABG, S/P MI. Patient underwent thoracentesis last week with improvement of breathing.  CODE STATUS: DNR-L/MOST  PPS: 50% HOSPICE ELIGIBILITY/DIAGNOSIS: TBD  PAST MEDICAL HISTORY:  Past Medical History:  Diagnosis Date  . Anxiety   . Bradycardia    a. BB d/c'd in 2016.  [name] CAD (coronary artery disease)    a. 1983 s/p CABG [facility] [place]);  b. 2007 NSTEMI->med Rx for distal LCX dzs; c. 09/2014 NSTEMI/Cath: LIMA->LAD & diag ok, native RCA dominant and patent.   VG->RCA 100, VG->LCX 100 -->Med Rx.  . CKD (chronic kidney disease), stage III ([place])   . Claudication ([place])    BLE  .  Depression   . Diastolic dysfunction    a. 10/2014 Echo: F 60-65%, Gr1 DD, mild AS, mild AI, mild to mod MR, PASP 71mmHg.  [name] Dyspnea 2010   syndrome - extensive  . Erectile dysfunction   . GERD (gastroesophageal reflux disease)   . H/O hiatal hernia   . History of blood transfusion 2014  . Hx of adenomatous colonic polyps 2007  . Hyperlipidemia   . Hypertensive heart disease   . Inguinal hernia   . Iron deficiency anemia   . Lung cancer ([place])   . Shingles 1990's  . Sigmoid diverticulosis   . Upper GI bleeding 2014   Gastritis and possible duodenal ulcer seen on EGD, felt secondary to NSAIDs  . Urethral stricture 11/2008  . Vasovagal syncope 05/2006  . Weight loss     SOCIAL HX:  Social History   Tobacco Use  . Smoking status: Never Smoker  . Smokeless tobacco: Never Used  Substance Use Topics  . Alcohol use: Not Currently    Alcohol/week: 7.0 standard drinks    Types: 7 Glasses of wine per week    Comment: wine - daily - currently not drinking    ALLERGIES: No Known Allergies   PERTINENT MEDICATIONS:  Outpatient Encounter Medications as of 12/30/2017  Medication Sig  . acetaminophen (TYLENOL) 500 MG tablet Take 1 tablet (500 mg total) by mouth every 6 (six) hours as needed for mild pain or moderate pain.  [name] albuterol (PROAIR HFA) 108 (90 Base) MCG/ACT inhaler Inhale 2 puffs into the lungs every 6 (six) hours as needed for wheezing or shortness of breath.  [name] atorvastatin (LIPITOR) 40  MG tablet Take 1 tablet (40 mg total) by mouth daily at 6 PM.  . BREO ELLIPTA 100-25 MCG/INH AEPB Inhale 1 puff into the lungs daily.  [name] buPROPion (WELLBUTRIN SR) 150 MG 12 hr tablet Take 1 tablet (150 mg total) by mouth daily.  . clopidogrel (PLAVIX) 75 MG tablet Take 1 tablet (75 mg total) by mouth daily.  [name] Desvenlafaxine Succinate ER 25 MG TB24 Take 25 mg by mouth daily.  [name] FERROCITE 324 MG TABS tablet Take 1 tablet (106 mg of iron total) by mouth daily.  . isosorbide mononitrate (IMDUR) 30  MG 24 hr tablet Take 1 tablet (30 mg total) by mouth daily.  . nitroGLYCERIN (NITROSTAT) 0.4 MG SL tablet Place 1 tablet (0.4 mg total) under the tongue every 5 (five) minutes x 3 doses as needed for chest pain.  . Omega-3 Fatty Acids (FISH OIL) 1000 MG CAPS Take 1 capsule (1,000 mg total) by mouth daily.  [name] osimertinib mesylate (TAGRISSO) 80 MG tablet TAKE 1 TABLET (80 MG TOTAL) BY MOUTH DAILY.  [name] OXYGEN Inhale 2 L into the lungs continuous.  . pantoprazole (PROTONIX) 40 MG tablet Take 1 tablet (40 mg total) by mouth 2 (two) times daily before a meal.  . vitamin B-12 (CYANOCOBALAMIN) 500 MCG tablet Take 1 tablet (500 mcg total) by mouth daily. complex   No facility-administered encounter medications on file as of 12/30/2017.     PHYSICAL EXAM:   General: NAD, frail appearing, thin Cardiovascular: regular rate and rhythm Pulmonary: clear ant fields Abdomen: soft, nontender, + bowel sounds GU: no suprapubic tenderness Extremities: no edema, no joint deformities Skin: no rashes Neurological: Weakness but otherwise nonfocal  Stephanie G Martinique, NP

## 2017-12-30 NOTE — Progress Notes (Signed)
Location:  [facility] (12) Provider:  [name] MD  [name], MD  Patient Care Team: [name], MD as PCP - General (Internal Medicine) Jettie Booze, MD as PCP - Cardiology (Cardiology) Mast, [name], NP as Nurse Practitioner (Internal Medicine) [facility] as Nurse Practitioner [facility])  Extended Emergency Contact Information Primary Emergency Contact: [facility] Address: [address]          [place], [place] [address] Johnnette Litter of [place] Phone: [phone] Relation: Spouse Secondary Emergency Contact: Vaeth,[name] Address: [place], [place] [address] Johnnette Litter of [place] Phone: [phone] Work Phone: [phone] Mobile Phone: [phone] Relation: Daughter  Code Status:  DNR  Goals of care: Advanced Directive information Advanced Directives 12/30/2017  Does Patient Have a Medical Advance Directive? Yes  Type of Paramedic of [place];Out of facility DNR (pink MOST or yellow form)  Does patient want to make changes to medical advance directive? No - Patient declined  Copy of [place] in Chart? Yes  Would patient like information on creating a medical advance directive? -  Pre-existing out of facility DNR order (yellow form or pink MOST form) Yellow form placed in chart (order not valid for inpatient use)     Chief Complaint  Patient presents with  . Acute Visit    headache, abdominal pain x 2 days    HPI:  Pt is a 84 y.o. male seen today for acute visit. He started having pain in his stomach and headache 2 days back. he was having gasiness and discomfort to his abdomen with poor appetite on Sunday. He took 2 peptobismol with relief of symptom. He ate a small meal for both lunch and dinner yesterday. Denies any nausea or vomiting. No abdominal pain today. Ate  his breakfast this morning. He underwent thoracocentesis 6 days back. He was having a generalized headache 2 days back as well that has now resolved. He is seen today with his wife and daughter present.    Past Medical History:  Diagnosis Date  . Anxiety   . Bradycardia    a. BB d/c'd in 2016.  [name] CAD (coronary artery disease)    a. 1983 s/p CABG [facility] [place]);  b. 2007 NSTEMI->med Rx for distal LCX dzs; c. 09/2014 NSTEMI/Cath: LIMA->LAD & diag ok, native RCA dominant and patent.   VG->RCA 100, VG->LCX 100 -->Med Rx.  . CKD (chronic kidney disease), stage III ([place])   . Claudication ([place])    BLE  . Depression   . Diastolic dysfunction    a. 10/2014 Echo: F 60-65%, Gr1 DD, mild AS, mild AI, mild to mod MR, PASP 56mmHg.  [name] Dyspnea 2010   syndrome - extensive  . Erectile dysfunction   . GERD (gastroesophageal reflux disease)   . H/O hiatal hernia   . History of blood transfusion 2014  . Hx of adenomatous colonic polyps 2007  . Hyperlipidemia   . Hypertensive heart disease   . Inguinal hernia   . Iron deficiency anemia   . Lung cancer ([place])   . Shingles 1990's  . Sigmoid diverticulosis   . Upper GI bleeding 2014   Gastritis and possible duodenal ulcer seen on EGD, felt secondary to NSAIDs  . Urethral stricture 11/2008  . Vasovagal syncope 05/2006  .  Weight loss    Past Surgical History:  Procedure Laterality Date  . CARDIAC CATHETERIZATION  2007   Left main 100%, RCA 70% ostial, all grafts were patent, EF 40-45 percent, distal circumflex subtotal after graft insertion, medical therapy   . CARDIAC CATHETERIZATION N/A 10/14/2014   Procedure: Left Heart Cath and Cors/Grafts Angiography;  Surgeon: Lorretta Harp, MD;  Location: [place] [facility];  Service: Cardiovascular;  Laterality: N/A;  . CHOLECYSTECTOMY  1980's  . CORONARY ARTERY BYPASS GRAFT  1994    LIMA-LAD, SVG-D1, SVG-OM,  SVG-RCA; performed in [place], [place]  . ESOPHAGOGASTRODUODENOSCOPY N/A 03/25/2013    Procedure: ESOPHAGOGASTRODUODENOSCOPY (EGD);  Surgeon: Missy Sabins, MD;  gastritis, cannot rule out duodenal ulcer   . INGUINAL HERNIA REPAIR Left   . IR THORACENTESIS ASP PLEURAL SPACE W/IMG GUIDE  10/07/2017  . TRANSURETHRAL RESECTION OF PROSTATE      No Known Allergies  Outpatient Encounter Medications as of 12/30/2017  Medication Sig  . acetaminophen (TYLENOL) 500 MG tablet Take 1 tablet (500 mg total) by mouth every 6 (six) hours as needed for mild pain or moderate pain.  [name] albuterol (PROAIR HFA) 108 (90 Base) MCG/ACT inhaler Inhale 2 puffs into the lungs every 6 (six) hours as needed for wheezing or shortness of breath.  [name] atorvastatin (LIPITOR) 40 MG tablet Take 1 tablet (40 mg total) by mouth daily at 6 PM.  . BREO ELLIPTA 100-25 MCG/INH AEPB Inhale 1 puff into the lungs daily.  [name] buPROPion (WELLBUTRIN SR) 150 MG 12 hr tablet Take 1 tablet (150 mg total) by mouth daily.  . clopidogrel (PLAVIX) 75 MG tablet Take 1 tablet (75 mg total) by mouth daily.  [name] Desvenlafaxine Succinate ER 25 MG TB24 Take 25 mg by mouth daily.  [name] FERROCITE 324 MG TABS tablet Take 1 tablet (106 mg of iron total) by mouth daily.  . isosorbide mononitrate (IMDUR) 30 MG 24 hr tablet Take 1 tablet (30 mg total) by mouth daily.  . nitroGLYCERIN (NITROSTAT) 0.4 MG SL tablet Place 1 tablet (0.4 mg total) under the tongue every 5 (five) minutes x 3 doses as needed for chest pain.  . Omega-3 Fatty Acids (FISH OIL) 1000 MG CAPS Take 1 capsule (1,000 mg total) by mouth daily.  [name] osimertinib mesylate (TAGRISSO) 80 MG tablet TAKE 1 TABLET (80 MG TOTAL) BY MOUTH DAILY.  [name] OXYGEN Inhale 2 L into the lungs continuous.  . pantoprazole (PROTONIX) 40 MG tablet Take 1 tablet (40 mg total) by mouth 2 (two) times daily before a meal.  . vitamin B-12 (CYANOCOBALAMIN) 500 MCG tablet Take 1 tablet (500 mcg total) by mouth daily. complex   No facility-administered encounter medications on file as of 12/30/2017.     Review of Systems    Constitutional: Positive for fatigue. Negative for chills and fever.  HENT: Positive for congestion and hearing loss. Negative for ear pain, mouth sores, rhinorrhea and trouble swallowing.   Respiratory: Positive for shortness of breath. Negative for cough.   Cardiovascular: Negative for chest pain and palpitations.  Gastrointestinal: Negative for abdominal pain, blood in stool, diarrhea, nausea and vomiting.  Genitourinary: Negative for dysuria.  Musculoskeletal: Positive for gait problem. Negative for back pain.  Skin: Negative for rash.  Neurological: Negative for dizziness and headaches.  Psychiatric/Behavioral: Negative for confusion and sleep disturbance.    Immunization History  Administered Date(s) Administered  . Influenza-Unspecified 02/17/2013, 05/20/2017  . Pneumococcal-Unspecified 05/20/2017  . Tdap 07/11/2012   Pertinent  Health Maintenance  Due  Topic Date Due  . INFLUENZA VACCINE  12/18/2017  . PNA vac Low Risk Adult (2 of 2 - PCV13) 05/20/2018   Fall Risk  09/25/2017 04/07/2017  Falls in the past year? Yes No  Number falls in past yr: 2 or more -  Injury with Fall? No -   Functional Status Survey:    Vitals:   12/30/17 1001  BP: 132/62  Pulse: 82  Resp: 20  Temp: (!) 97.5 F (36.4 C)  TempSrc: Oral  SpO2: 95%  Weight: 152 lb 9.6 oz (69.2 kg)  Height: 5\' 6"  (1.676 m)   Body mass index is 24.63 kg/m. Physical Exam  Constitutional: He is oriented to person, place, and time. He appears well-developed. No distress.  Frail, chronically ill appearing male  HENT:  Head: Normocephalic and atraumatic.  Right Ear: External ear normal.  Left Ear: External ear normal.  Nose: Nose normal.  Mouth/Throat: Oropharynx is clear and moist. No oropharyngeal exudate.  Eyes: Conjunctivae and EOM are normal. Right eye exhibits no discharge. Left eye exhibits no discharge.  Neck: Normal range of motion. Neck supple.  Cardiovascular: Normal rate and regular rhythm.   Murmur heard. Pulmonary/Chest: He has no wheezes.  On oxygen by nasal canula, no respiratory distress, poor air entry left > right  Abdominal: Soft. Bowel sounds are normal. There is no tenderness. There is no guarding.  Musculoskeletal:  Can move all 4 extremities, unsteady gait, using rolling walker for ambulation  Lymphadenopathy:    He has no cervical adenopathy.  Neurological: He is alert and oriented to person, place, and time.  Skin: Skin is warm and dry. He is not diaphoretic.  Psychiatric: He has a normal mood and affect.    Labs reviewed: Recent Labs    05/23/17 0947  06/20/17 1054  08/01/17 1106  10/20/17 0842  11/18/17 0924 12/19/17 12/24/17 1300  NA  --    < > 140   < > 142   < > 141   < > 142 139 143  K  --    < > 5.4*   < > 4.5   < > 4.5   < > 4.1 4.2 4.6  CL  --   --  107   < > 109   < > 106   < > 107 106 107  CO2  --    < > 26   < > 25   < > 26   < > 28 26 26   GLUCOSE  --    < > 104  --  65*   < > 88  --  102*  --  55*  BUN  --    < > 35*   < > 26   < > 26   < > 31* 32* 37*  CREATININE  --    < > 2.30*   < > 1.69*   < > 1.92*   < > 1.78* 1.7* 2.03*  CALCIUM  --    < > 9.3   < > 9.3   < > 9.4   < > 8.8* 9.1 9.3  MG  --    < > 3.0*  --  2.4  --   --   --  2.3  --   --   PHOS 3.7  --  3.4  --   --   --   --   --   --   --   --    < > =  values in this interval not displayed.   Recent Labs    10/20/17 0842  11/18/17 0924 12/19/17 12/24/17 1300  AST 21   < > 21 65* 40  ALT 20   < > 20 82* 52*  ALKPHOS 78   < > 72 87 81  BILITOT 0.4  --  0.4  --  0.4  PROT 6.5  --  6.4* 5.9 7.0  ALBUMIN 3.3*   < > 3.4* 3.7 3.5   < > = values in this interval not displayed.   Recent Labs    10/20/17 0842  11/18/17 0924 12/19/17 12/24/17 1300  WBC 7.2   < > 5.7 4.8 8.6  NEUTROABS 5.2  --  4.0  --  5.5  HGB 12.5*   < > 11.6* 11.5* 12.0*  HCT 39.8   < > 37.3* 36* 38.3*  MCV 88.4  --  88.2  --  87.2  PLT 141   < > 116* 121* 126*   < > = values in this interval not  displayed.   Lab Results  Component Value Date   TSH 2.530 08/14/2017   No results found for: HGBA1C Lab Results  Component Value Date   CHOL 175 10/14/2014   HDL 39 (L) 10/14/2014   LDLCALC 114 (H) 10/14/2014   TRIG 108 10/14/2014   CHOLHDL 4.5 10/14/2014    Significant Diagnostic Results in last 30 days:  Dg Chest 1 View  Result Date: 12/25/2017 CLINICAL DATA:  Post left thoracentesis, 1.2 liters removed, c/o left shoulder pain EXAM: CHEST  1 VIEW COMPARISON:  12/24/2016 FINDINGS: Median sternotomy and CABG. The heart size is mildly enlarged. Moderate hiatal hernia is present. Small LEFT pleural effusion has diminished in size. There is no pneumothorax. Trace RIGHT pleural effusion and mild pulmonary edema. IMPRESSION: No pneumothorax following LEFT thoracentesis. Smaller LEFT pleural effusion. Large hiatal hernia. Mild edema. Electronically Signed   By: Nolon Nations M.D.   On: 12/25/2017 15:28   Dg Chest 2 View  Result Date: 12/24/2017 CLINICAL DATA:  Left upper lobe lung cancer. Assess for layering effusion. EXAM: CHEST - 2 VIEW COMPARISON:  Decubitus views same day. Previous standard chest radiography 11/17/2017. FINDINGS: Previous median sternotomy and CABG. The right chest is clear. There is a hiatal hernia. On the left, there is slightly better aeration of the left lower lung compared to the study of 11/17/2017. Patient does have some persistent pleural fluid on the left, shown to layer on the decubitus film. Persistent volume loss in the left lower lung related to the effusion that does remain. No acute bone finding. IMPRESSION: Improved when compared to the study of 11/17/2017. Less pleural fluid on the left with better aeration of the left lower lobe. Some pleural fluid does persist, with persistent volume loss at the left base. Electronically Signed   By: Nelson Chimes M.D.   On: 12/24/2017 15:32   Dg Chest Left Decubitus  Result Date: 12/24/2017 CLINICAL DATA:  Left upper lobe  lung cancer. Assess for layering effusion. EXAM: CHEST - LEFT DECUBITUS COMPARISON:  Same day FINDINGS: Moderate size left effusion layers at least partially. Some persistent density in the left lower chest does remain in that location. IMPRESSION: At least a moderate amount of layering pleural fluid on the left. Electronically Signed   By: Nelson Chimes M.D.   On: 12/24/2017 15:30   US Thoracentesis Asp Pleural Space W/img Guide  Result Date: 12/25/2017 INDICATION: Patient with history of metastatic adenocarcinoma of  the lung with recurrent malignant left pleural effusion, dyspnea. Request made for therapeutic left thoracentesis. EXAM: ULTRASOUND GUIDED THERAPEUTIC LEFT THORACENTESIS MEDICATIONS: None COMPLICATIONS: None immediate. PROCEDURE: An ultrasound guided thoracentesis was thoroughly discussed with the patient and questions answered. The benefits, risks, alternatives and complications were also discussed. The patient understands and wishes to proceed with the procedure. Written consent was obtained. Ultrasound was performed to localize and mark an adequate pocket of fluid in the left chest. The area was then prepped and draped in the normal sterile fashion. 1% Lidocaine was used for local anesthesia. Under ultrasound guidance a 6 Fr Safe-T-Centesis catheter was introduced. Thoracentesis was performed. The catheter was removed and a dressing applied. FINDINGS: A total of approximately 1.2 liters of slightly hazy, yellow fluid was removed. Due to patient chest/left shoulder discomfort only the above amount of fluid was removed today. IMPRESSION: Successful ultrasound guided therapeutic left thoracentesis yielding 1.2 liters of pleural fluid. Read by: Rowe Robert, PA-C Electronically Signed   By: Aletta Edouard M.D.   On: 12/25/2017 14:51    Assessment/Plan  1. Recurrent left pleural effusion S/p thoracocentesis, has it on routine interval. Reviewed goals of care with pt today.  - Amb Referral to  Palliative Care  2. Primary adenocarcinoma of upper lobe of left lung ([place]) Continue current treatment plan with oxygen, tagrisso and oncology follow up.  - Amb Referral to Palliative Care  3. Chronic diastolic CHF (congestive heart failure) (HCC) Overall stable. Continue imdur 30 mg daily. - Amb Referral to Palliative Care  4. Dyspnea and respiratory abnormality With recurrent pleural effusion with adenocarcinoma and CHF. Supportive care for now. Goals of care reviewed. Palliative care consult placed. Continue prn albuterol  5. Gastroesophageal reflux disease, esophagitis presence not specified Continue protonix 40 mg bid and monitor  6. Goals of care, counseling/discussion Has DNR and MOST form in chart. Pt has not been followed by palliative care since 10/2017 for unclear reason. Will place another consult for palliative care to follow with the patient. Pt is hospice eligible but would like to discuss this further with his family before he decides. He definitely wants to focus on being comfortable in his apartment.    Family/ staff Communication: reviewed care plan with patient and his wife and daughter   Labs/tests ordered:  Palliative care consult   [name], MD Internal Medicine [place], [place] [address] Cell Phone (Monday-Friday 8 am - 5 pm): [phone] On Call: [phone] and follow prompts after 5 pm and on weekends Office Phone: [phone] Office Fax: [phone]

## 2017-12-31 ENCOUNTER — Encounter: Payer: Self-pay | Admitting: Internal Medicine

## 2017-12-31 ENCOUNTER — Encounter: Payer: Medicare Other | Admitting: Internal Medicine

## 2018-01-04 ENCOUNTER — Emergency Department (HOSPITAL_COMMUNITY): Payer: Medicare Other

## 2018-01-04 ENCOUNTER — Inpatient Hospital Stay (HOSPITAL_COMMUNITY)
Admission: EM | Admit: 2018-01-04 | Discharge: 2018-01-06 | DRG: 065 | Disposition: A | Discharge status: Hospice | Payer: Medicare Other | Attending: Internal Medicine | Admitting: Internal Medicine

## 2018-01-04 ENCOUNTER — Encounter (HOSPITAL_COMMUNITY): Payer: Self-pay | Admitting: Radiology

## 2018-01-04 ENCOUNTER — Other Ambulatory Visit: Payer: Self-pay

## 2018-01-04 DIAGNOSIS — Z6824 Body mass index (BMI) 24.0-24.9, adult: Secondary | ICD-10-CM

## 2018-01-04 DIAGNOSIS — Z515 Encounter for palliative care: Secondary | ICD-10-CM | POA: Diagnosis present

## 2018-01-04 DIAGNOSIS — N183 Chronic kidney disease, stage 3 (moderate): Secondary | ICD-10-CM | POA: Diagnosis present

## 2018-01-04 DIAGNOSIS — Z85118 Personal history of other malignant neoplasm of bronchus and lung: Secondary | ICD-10-CM

## 2018-01-04 DIAGNOSIS — R4189 Other symptoms and signs involving cognitive functions and awareness: Secondary | ICD-10-CM | POA: Diagnosis present

## 2018-01-04 DIAGNOSIS — Z9079 Acquired absence of other genital organ(s): Secondary | ICD-10-CM

## 2018-01-04 DIAGNOSIS — F329 Major depressive disorder, single episode, unspecified: Secondary | ICD-10-CM | POA: Diagnosis present

## 2018-01-04 DIAGNOSIS — R404 Transient alteration of awareness: Secondary | ICD-10-CM | POA: Diagnosis not present

## 2018-01-04 DIAGNOSIS — Z8619 Personal history of other infectious and parasitic diseases: Secondary | ICD-10-CM

## 2018-01-04 DIAGNOSIS — Z8249 Family history of ischemic heart disease and other diseases of the circulatory system: Secondary | ICD-10-CM

## 2018-01-04 DIAGNOSIS — Z66 Do not resuscitate: Secondary | ICD-10-CM | POA: Diagnosis present

## 2018-01-04 DIAGNOSIS — Z7951 Long term (current) use of inhaled steroids: Secondary | ICD-10-CM

## 2018-01-04 DIAGNOSIS — J90 Pleural effusion, not elsewhere classified: Secondary | ICD-10-CM | POA: Diagnosis not present

## 2018-01-04 DIAGNOSIS — G8191 Hemiplegia, unspecified affecting right dominant side: Secondary | ICD-10-CM | POA: Diagnosis present

## 2018-01-04 DIAGNOSIS — I6523 Occlusion and stenosis of bilateral carotid arteries: Secondary | ICD-10-CM | POA: Diagnosis not present

## 2018-01-04 DIAGNOSIS — C3412 Malignant neoplasm of upper lobe, left bronchus or lung: Secondary | ICD-10-CM | POA: Diagnosis present

## 2018-01-04 DIAGNOSIS — R4182 Altered mental status, unspecified: Secondary | ICD-10-CM | POA: Diagnosis not present

## 2018-01-04 DIAGNOSIS — Z86010 Personal history of colonic polyps: Secondary | ICD-10-CM

## 2018-01-04 DIAGNOSIS — R64 Cachexia: Secondary | ICD-10-CM | POA: Diagnosis present

## 2018-01-04 DIAGNOSIS — I2510 Atherosclerotic heart disease of native coronary artery without angina pectoris: Secondary | ICD-10-CM | POA: Diagnosis present

## 2018-01-04 DIAGNOSIS — I10 Essential (primary) hypertension: Secondary | ICD-10-CM | POA: Diagnosis not present

## 2018-01-04 DIAGNOSIS — I5032 Chronic diastolic (congestive) heart failure: Secondary | ICD-10-CM | POA: Diagnosis present

## 2018-01-04 DIAGNOSIS — I130 Hypertensive heart and chronic kidney disease with heart failure and stage 1 through stage 4 chronic kidney disease, or unspecified chronic kidney disease: Secondary | ICD-10-CM | POA: Diagnosis present

## 2018-01-04 DIAGNOSIS — I252 Old myocardial infarction: Secondary | ICD-10-CM

## 2018-01-04 DIAGNOSIS — I639 Cerebral infarction, unspecified: Secondary | ICD-10-CM | POA: Diagnosis present

## 2018-01-04 DIAGNOSIS — Z79899 Other long term (current) drug therapy: Secondary | ICD-10-CM

## 2018-01-04 DIAGNOSIS — Z9049 Acquired absence of other specified parts of digestive tract: Secondary | ICD-10-CM

## 2018-01-04 DIAGNOSIS — I63512 Cerebral infarction due to unspecified occlusion or stenosis of left middle cerebral artery: Principal | ICD-10-CM | POA: Diagnosis present

## 2018-01-04 DIAGNOSIS — K219 Gastro-esophageal reflux disease without esophagitis: Secondary | ICD-10-CM | POA: Diagnosis not present

## 2018-01-04 DIAGNOSIS — R29717 NIHSS score 17: Secondary | ICD-10-CM | POA: Diagnosis present

## 2018-01-04 DIAGNOSIS — E785 Hyperlipidemia, unspecified: Secondary | ICD-10-CM | POA: Diagnosis present

## 2018-01-04 DIAGNOSIS — Z9981 Dependence on supplemental oxygen: Secondary | ICD-10-CM

## 2018-01-04 DIAGNOSIS — R4701 Aphasia: Secondary | ICD-10-CM | POA: Diagnosis present

## 2018-01-04 DIAGNOSIS — Z951 Presence of aortocoronary bypass graft: Secondary | ICD-10-CM

## 2018-01-04 DIAGNOSIS — Z7902 Long term (current) use of antithrombotics/antiplatelets: Secondary | ICD-10-CM

## 2018-01-04 DIAGNOSIS — R5381 Other malaise: Secondary | ICD-10-CM | POA: Diagnosis present

## 2018-01-04 LAB — CBC
HCT: 37.1 % — ABNORMAL LOW (ref 39.0–52.0)
Hemoglobin: 11.1 g/dL — ABNORMAL LOW (ref 13.0–17.0)
MCH: 27.2 pg (ref 26.0–34.0)
MCHC: 29.9 g/dL — ABNORMAL LOW (ref 30.0–36.0)
MCV: 90.9 fL (ref 78.0–100.0)
PLATELETS: 160 10*3/uL (ref 150–400)
RBC: 4.08 MIL/uL — ABNORMAL LOW (ref 4.22–5.81)
RDW: 14.6 % (ref 11.5–15.5)
WBC: 9.2 10*3/uL (ref 4.0–10.5)

## 2018-01-04 LAB — APTT: aPTT: 33 s (ref 24–36)

## 2018-01-04 LAB — DIFFERENTIAL
Abs Immature Granulocytes: 0.1 K/uL (ref 0.0–0.1)
Basophils Absolute: 0 K/uL (ref 0.0–0.1)
Basophils Relative: 0 %
Eosinophils Absolute: 0.1 K/uL (ref 0.0–0.7)
Eosinophils Relative: 2 %
Immature Granulocytes: 1 %
Lymphocytes Relative: 6 %
Lymphs Abs: 0.6 K/uL — ABNORMAL LOW (ref 0.7–4.0)
Monocytes Absolute: 0.5 K/uL (ref 0.1–1.0)
Monocytes Relative: 5 %
Neutro Abs: 7.9 K/uL — ABNORMAL HIGH (ref 1.7–7.7)
Neutrophils Relative %: 86 %

## 2018-01-04 LAB — I-STAT CHEM 8, ED
BUN: 40 mg/dL — ABNORMAL HIGH (ref 8–23)
Calcium, Ion: 1.13 mmol/L — ABNORMAL LOW (ref 1.15–1.40)
Chloride: 107 mmol/L (ref 98–111)
Creatinine, Ser: 1.9 mg/dL — ABNORMAL HIGH (ref 0.61–1.24)
Glucose, Bld: 118 mg/dL — ABNORMAL HIGH (ref 70–99)
HCT: 35 % — ABNORMAL LOW (ref 39.0–52.0)
Hemoglobin: 11.9 g/dL — ABNORMAL LOW (ref 13.0–17.0)
Potassium: 4.7 mmol/L (ref 3.5–5.1)
Sodium: 140 mmol/L (ref 135–145)
TCO2: 24 mmol/L (ref 22–32)

## 2018-01-04 LAB — COMPREHENSIVE METABOLIC PANEL WITH GFR
ALT: 35 U/L (ref 0–44)
AST: 35 U/L (ref 15–41)
Albumin: 3.2 g/dL — ABNORMAL LOW (ref 3.5–5.0)
Alkaline Phosphatase: 85 U/L (ref 38–126)
Anion gap: 10 (ref 5–15)
BUN: 37 mg/dL — ABNORMAL HIGH (ref 8–23)
CO2: 24 mmol/L (ref 22–32)
Calcium: 9.4 mg/dL (ref 8.9–10.3)
Chloride: 107 mmol/L (ref 98–111)
Creatinine, Ser: 1.93 mg/dL — ABNORMAL HIGH (ref 0.61–1.24)
GFR calc Af Amer: 33 mL/min — ABNORMAL LOW
GFR calc non Af Amer: 28 mL/min — ABNORMAL LOW
Glucose, Bld: 121 mg/dL — ABNORMAL HIGH (ref 70–99)
Potassium: 4.8 mmol/L (ref 3.5–5.1)
Sodium: 141 mmol/L (ref 135–145)
Total Bilirubin: 0.8 mg/dL (ref 0.3–1.2)
Total Protein: 6.7 g/dL (ref 6.5–8.1)

## 2018-01-04 LAB — PROTIME-INR
INR: 1.1
Prothrombin Time: 14.1 s (ref 11.4–15.2)

## 2018-01-04 LAB — I-STAT TROPONIN, ED: Troponin i, poc: 0.83 ng/mL (ref 0.00–0.08)

## 2018-01-04 MED ORDER — IOPAMIDOL (ISOVUE-370) INJECTION 76%
50.0000 mL | Freq: Once | INTRAVENOUS | Status: AC | PRN
  Administered 2018-01-04: 50 mL via INTRAVENOUS

## 2018-01-04 MED ORDER — IOPAMIDOL (ISOVUE-370) INJECTION 76%
INTRAVENOUS | Status: AC
  Filled 2018-01-04: qty 50

## 2018-01-04 MED ORDER — PANTOPRAZOLE SODIUM 40 MG PO TBEC: 40.0000 mg | DELAYED_RELEASE_TABLET | Freq: Every day | ORAL | Status: DC

## 2018-01-04 MED ORDER — MAGIC MOUTHWASH
15.0000 mL | Freq: Four times a day (QID) | ORAL | Status: DC | PRN
  Filled 2018-01-04: qty 15

## 2018-01-04 MED ORDER — LORAZEPAM 1 MG PO TABS: 1.0000 mg | ORAL_TABLET | ORAL | Status: DC | PRN

## 2018-01-04 MED ORDER — ALBUTEROL SULFATE (2.5 MG/3ML) 0.083% IN NEBU
2.5000 mg | INHALATION_SOLUTION | Freq: Four times a day (QID) | RESPIRATORY_TRACT | Status: DC | PRN
  Administered 2018-01-05 – 2018-01-06 (×5): 2.5 mg via RESPIRATORY_TRACT
  Filled 2018-01-04 (×5): qty 3

## 2018-01-04 MED ORDER — FLUTICASONE FUROATE-VILANTEROL 100-25 MCG/INH IN AEPB
1.0000 | INHALATION_SPRAY | Freq: Every day | RESPIRATORY_TRACT | Status: DC
  Administered 2018-01-05: 1 via RESPIRATORY_TRACT
  Filled 2018-01-04: qty 28

## 2018-01-04 MED ORDER — SENNOSIDES-DOCUSATE SODIUM 8.6-50 MG PO TABS: 1.0000 | ORAL_TABLET | Freq: Every evening | ORAL | Status: DC | PRN

## 2018-01-04 MED ORDER — OXYBUTYNIN CHLORIDE 5 MG PO TABS: 2.5000 mg | ORAL_TABLET | Freq: Four times a day (QID) | ORAL | Status: DC | PRN

## 2018-01-04 MED ORDER — ACETAMINOPHEN 650 MG RE SUPP: 650.0000 mg | Freq: Four times a day (QID) | RECTAL | Status: DC | PRN

## 2018-01-04 MED ORDER — ONDANSETRON HCL 4 MG/2ML IJ SOLN: 4.0000 mg | Freq: Four times a day (QID) | INTRAMUSCULAR | Status: DC | PRN

## 2018-01-04 MED ORDER — ACETAMINOPHEN 325 MG PO TABS: 650.0000 mg | ORAL_TABLET | Freq: Four times a day (QID) | ORAL | Status: DC | PRN

## 2018-01-04 MED ORDER — POLYVINYL ALCOHOL 1.4 % OP SOLN
1.0000 [drp] | Freq: Four times a day (QID) | OPHTHALMIC | Status: DC | PRN
  Filled 2018-01-04: qty 15

## 2018-01-04 MED ORDER — DIPHENHYDRAMINE HCL 50 MG/ML IJ SOLN: 12.5000 mg | INTRAMUSCULAR | Status: DC | PRN

## 2018-01-04 MED ORDER — LORAZEPAM 2 MG/ML PO CONC: 1.0000 mg | ORAL | Status: DC | PRN

## 2018-01-04 MED ORDER — TRAZODONE HCL 50 MG PO TABS: 25.0000 mg | ORAL_TABLET | Freq: Every evening | ORAL | Status: DC | PRN

## 2018-01-04 MED ORDER — ONDANSETRON HCL 4 MG PO TABS: 4.0000 mg | ORAL_TABLET | Freq: Four times a day (QID) | ORAL | Status: DC | PRN

## 2018-01-04 MED ORDER — LORAZEPAM 2 MG/ML IJ SOLN
1.0000 mg | INTRAMUSCULAR | Status: DC | PRN
  Administered 2018-01-05 (×2): 0.5 mg via INTRAVENOUS
  Administered 2018-01-06 (×2): 1 mg via INTRAVENOUS
  Filled 2018-01-04 (×3): qty 1

## 2018-01-04 NOTE — Consult Note (Addendum)
NEURO HOSPITALIST      Requesting Physician: Dr. Kathrynn Humble    Chief Complaint: Code stroke-right side weakness, aphasia   History obtained from:  Chart/ family  HPI:                                                                                                                                         Scott [name] is an 84 y.o. male  With PMH of HLD, CKD 3, HTN heart disease, metastatic lung adenocarcinoma who presented to [facility] via EMS for unresponsiveness. Code stroke was activated by ED   Wife states that she went to church at 11:00am. When she last saw her husband he was in his usual state of health. Able to speak and was able to move around. Uses seated rolling walker at baseline. When she returned from church he could not speak and was weak. EMS was called and patient was brought to ED. Patient is DNR and  palliative care consult 12/30/17.  ED course:  BG: 118, BP: 144/82 GFR: 28 creatinine: 1.90 CT Head:  No acute cortically based infarct or acute intracranial hemorrhage identified. ASPECTS is 10. Chronic small vessel disease appears progressed by CT since 2017.  CTA head/neck: 1. Positive for emergent large vessel occlusion at the left MCA bifurcation. 2. Superimposed anterior circulation calcified plaque without other hemodynamically significant stenosis. 3. Moderate to severe left and mild-to-moderate right vertebral artery origin stenosis due to calcified plaque. Otherwise negative posterior circulation. 4. Increased chronic bilateral pleural effusions since May, loculated on the left. Associated increased bilateral compressive pulmonary atelectasis.  No prior stroke history noted   Date last known well: Date: 01/04/2018 Time last known well: Time: 11:00 tPA Given: No: comfort care decided per family  Modified Rankin: Rankin Score=4 NIHSS: 17  Past Medical History:  Diagnosis Date  . Anxiety   . Bradycardia    a.  BB d/c'd in 2016.  [name] CAD (coronary artery disease)    a. 1983 s/p CABG [facility] [place], [place]);  b. 2007 NSTEMI->med Rx for distal LCX dzs; c. 09/2014 NSTEMI/Cath: LIMA->LAD & diag ok, native RCA dominant and patent.   VG->RCA 100, VG->LCX 100 -->Med Rx.  . CKD (chronic kidney disease), stage III ([place])   . Claudication ([place])    BLE  . Depression   . Diastolic dysfunction    a. 10/2014 Echo: F 60-65%, Gr1 DD, mild AS, mild AI, mild to mod MR, PASP 97mmHg.  [name] Dyspnea 2010   syndrome - extensive  . Erectile dysfunction   . GERD (gastroesophageal reflux disease)   . H/O hiatal hernia   . History of blood  transfusion 2014  . Hx of adenomatous colonic polyps 2007  . Hyperlipidemia   . Hypertensive heart disease   . Inguinal hernia   . Iron deficiency anemia   . Lung cancer ([place])   . Shingles 1990's  . Sigmoid diverticulosis   . Upper GI bleeding 2014   Gastritis and possible duodenal ulcer seen on EGD, felt secondary to NSAIDs  . Urethral stricture 11/2008  . Vasovagal syncope 05/2006  . Weight loss     Past Surgical History:  Procedure Laterality Date  . CARDIAC CATHETERIZATION  2007   Left main 100%, RCA 70% ostial, all grafts were patent, EF 40-45 percent, distal circumflex subtotal after graft insertion, medical therapy   . CARDIAC CATHETERIZATION N/A 10/14/2014   Procedure: Left Heart Cath and Cors/Grafts Angiography;  Surgeon: Lorretta Harp, MD;  Location: [facility] CV LAB;  Service: Cardiovascular;  Laterality: N/A;  . CHOLECYSTECTOMY  1980's  . CORONARY ARTERY BYPASS GRAFT  1994    LIMA-LAD, SVG-D1, SVG-OM,  SVG-RCA; performed in [place], [place]  . ESOPHAGOGASTRODUODENOSCOPY N/A 03/25/2013   Procedure: ESOPHAGOGASTRODUODENOSCOPY (EGD);  Surgeon: Missy Sabins, MD;  gastritis, cannot rule out duodenal ulcer   . INGUINAL HERNIA REPAIR Left   . IR THORACENTESIS ASP PLEURAL SPACE W/IMG GUIDE  10/07/2017  . TRANSURETHRAL RESECTION OF PROSTATE      Family History  Problem  Relation Age of Onset  . Hypertension Mother   . Heart disease Mother   . Heart disease Father   . Heart failure Brother   . Heart failure Sister   . Heart failure Brother   . Heart attack Neg Hx   . Stroke Neg Hx          Social History:  reports that he has never smoked. He has never used smokeless tobacco. He reports that he drank about 7.0 standard drinks of alcohol per week. He reports that he does not use drugs.  Allergies: No Known Allergies  Medications:                                                                                                                           Current Facility-Administered Medications  Medication Dose Route Frequency Provider Last Rate Last Dose  . iopamidol (ISOVUE-370) 76 % injection            Current Outpatient Medications  Medication Sig Dispense Refill  . acetaminophen (TYLENOL) 500 MG tablet Take 1 tablet (500 mg total) by mouth every 6 (six) hours as needed for mild pain or moderate pain. 30 tablet 2  . albuterol (PROAIR HFA) 108 (90 Base) MCG/ACT inhaler Inhale 2 puffs into the lungs every 6 (six) hours as needed for wheezing or shortness of breath. 18 g 2  . atorvastatin (LIPITOR) 40 MG tablet Take 1 tablet (40 mg total) by mouth daily at 6 PM. 90 tablet 2  . BREO ELLIPTA 100-25 MCG/INH AEPB Inhale 1 puff into the lungs daily. 28 each  2  . buPROPion (WELLBUTRIN SR) 150 MG 12 hr tablet Take 1 tablet (150 mg total) by mouth daily. 30 tablet 2  . clopidogrel (PLAVIX) 75 MG tablet Take 1 tablet (75 mg total) by mouth daily. 30 tablet 0  . Desvenlafaxine Succinate ER 25 MG TB24 Take 25 mg by mouth daily. 30 tablet 2  . FERROCITE 324 MG TABS tablet Take 1 tablet (106 mg of iron total) by mouth daily. 90 tablet 2  . isosorbide mononitrate (IMDUR) 30 MG 24 hr tablet Take 1 tablet (30 mg total) by mouth daily. 30 tablet 2  . nitroGLYCERIN (NITROSTAT) 0.4 MG SL tablet Place 1 tablet (0.4 mg total) under the tongue every 5 (five) minutes x 3 doses  as needed for chest pain. 25 tablet 4  . Omega-3 Fatty Acids (FISH OIL) 1000 MG CAPS Take 1 capsule (1,000 mg total) by mouth daily. 90 capsule 0  . osimertinib mesylate (TAGRISSO) 80 MG tablet TAKE 1 TABLET (80 MG TOTAL) BY MOUTH DAILY. 30 tablet 3  . OXYGEN Inhale 2 L into the lungs continuous.    . pantoprazole (PROTONIX) 40 MG tablet Take 1 tablet (40 mg total) by mouth 2 (two) times daily before a meal. 60 tablet 3  . vitamin B-12 (CYANOCOBALAMIN) 500 MCG tablet Take 1 tablet (500 mcg total) by mouth daily. complex 30 tablet 2     ROS:                                                                                                                                        unable to obtain d/t aphasia  General Examination:                                                                                                      Blood pressure (!) 144/82, pulse 80, temperature (!) 97.1 F (36.2 C), temperature source Oral, resp. rate 19, height 5\' 6"  (1.676 m), weight 68 kg, SpO2 100 %.  HEENT-  Normocephalic, no lesions, without obvious abnormality.  Normal external eye and conjunctiva. Cardiovascular- S1-S2 audible, murmur noted. pulses palpable throughout   Lungs-no rhonchi or wheezing noted, accessory muscle usage,  Saturations within normal limits on 2L [place] Abdomen- BS present in all 4 quadrants.  Extremities- Warm, dry and intact Musculoskeletal-no joint tenderness, deformity or swelling Skin-warm and dry, intact  Neurological Examination Mental Status: Awake, aphasic. Will follow some commands. Cranial Nerves: Left gaze preference.  Motor/Sensory: Right : Upper extremity  0/5    Left:     Upper extremity   3/5  Lower extremity   0/5     Lower extremity   3/5 Tone and bulk:normal tone throughout; no atrophy noted  Lab Results: Basic Metabolic Panel: Recent Labs  Lab 01/04/18 1350  NA 140  K 4.7  CL 107  GLUCOSE 118*  BUN 40*  CREATININE 1.90*    CBC: Recent Labs  Lab  01/04/18 1345 01/04/18 1350  WBC 9.2  --   NEUTROABS 7.9*  --   HGB 11.1* 11.9*  HCT 37.1* 35.0*  MCV 90.9  --   PLT 160  --    Imaging: Ct Angio Head W Or Wo Contrast  Result Date: 01/04/2018 CLINICAL DATA:  84 year old male code stroke. Right side weakness. History of lung cancer. EXAM: CT ANGIOGRAPHY HEAD AND NECK TECHNIQUE: Multidetector CT imaging of the head and neck was performed using the standard protocol during bolus administration of intravenous contrast. Multiplanar CT image reconstructions and MIPs were obtained to evaluate the vascular anatomy. Carotid stenosis measurements (when applicable) are obtained utilizing NASCET criteria, using the distal internal carotid diameter as the denominator. CONTRAST:  42mL ISOVUE-370 IOPAMIDOL (ISOVUE-370) INJECTION 76% COMPARISON:  Head CT without contrast 1354 hours today. Brain MRI 03/24/2017. Chest CT 10/14/2017. FINDINGS: CTA NECK Skeleton: Prior sternotomy. Osteopenia. No acute osseous abnormality identified. Upper chest: Increased chronic loculated left pleural effusion, increased fluid volume tracking into the left apex and along the left major fissure now. Associated increased compressive left lung atelectasis. Associated increased mass effect on left lower lobe airways. The other central airways remain patent. Increased layering right pleural effusion, now up to moderate. Associated increased right lower lobe compressive atelectasis. Stable visible cardiac size.  Prior CABG. Other neck: Negative.  No neck mass no lymphadenopathy. Aortic arch: Calcified aortic atherosclerosis. Three vessel arch configuration. Right carotid system: No brachiocephalic artery or right CCA origin stenosis despite plaque. Tortuous right CCA origin with a mildly kinked appearance. Bulky calcified atherosclerosis at the right carotid bifurcation, but no significant proximal right ICA stenosis. Left carotid system: No left CCA origin stenosis despite plaque. Calcified  plaque at the left ICA origin and bulb without hemodynamically significant stenosis. Vertebral arteries: No proximal right subclavian artery stenosis despite plaque. Calcified plaque at the right vertebral artery origin with mild stenosis. Tortuous right vertebral artery is patent to the skull base without additional stenosis. No proximal left subclavian artery stenosis despite plaque. Calcified plaque at the left vertebral artery origin with moderate to severe stenosis (series 8, image 107). The vertebral arteries are fairly codominant. The left remains patent to the skull base without additional stenosis. CTA HEAD Posterior circulation: Patent distal vertebral arteries without stenosis, the right is mildly dominant. Patent PICA origins and vertebrobasilar junction. Patent basilar artery without stenosis. Patent SCA and left PCA origins. Fetal type right PCA origin. Left Posterior communicating artery is diminutive or absent. Bilateral PCA branches are within normal limits. Anterior circulation: The left ICA siphon is patent with mild proximal and moderate supraclinoid left ICA calcified plaque. Mild supraclinoid left ICA stenosis. Patent left carotid terminus. Patent left MCA origin and M1 segment but clot visible at the left MCA bifurcation (series 11, image 23 and series 10, image 24). Subsequent paucity of left MCA M2 branches, particularly the middle sylvian branches. There is some reconstitution of the most anterior and posterior branches. Right ICA siphon is patent with mild to moderate calcified plaque and mild stenosis. Right ICA terminus, right  MCA and both ACA origins are patent and within normal limits. Anterior communicating artery and bilateral ACA branches are within normal limits, median artery of the corpus callosum (normal variant). Right MCA M1 segment, right MCA bifurcation and right MCA branches are within normal limits. Venous sinuses: Early contrast timing but there does appear to be  enhancement of the major dural venous sinuses. Anatomic variants: Mildly dominant distal right vertebral artery. Fetal type right PCA origin. Median artery of the corpus callosum. Review of the MIP images confirms the above findings IMPRESSION: 1. Positive for emergent large vessel occlusion at the left MCA bifurcation. This was communicated to Dr. Rory Percy at 1424 hours by text page via the [facility] messaging system. 2. Superimposed anterior circulation calcified plaque without other hemodynamically significant stenosis. 3. Moderate to severe left and mild-to-moderate right vertebral artery origin stenosis due to calcified plaque. Otherwise negative posterior circulation. 4. Increased chronic bilateral pleural effusions since May, loculated on the left. Associated increased bilateral compressive pulmonary atelectasis. 5.  Aortic Atherosclerosis (ICD10-I70.0). Electronically Signed   By: Genevie Ann M.D.   On: 01/04/2018 14:36   Ct Angio Neck W Or Wo Contrast  Result Date: 01/04/2018 CLINICAL DATA:  84 year old male code stroke. Right side weakness. History of lung cancer. EXAM: CT ANGIOGRAPHY HEAD AND NECK TECHNIQUE: Multidetector CT imaging of the head and neck was performed using the standard protocol during bolus administration of intravenous contrast. Multiplanar CT image reconstructions and MIPs were obtained to evaluate the vascular anatomy. Carotid stenosis measurements (when applicable) are obtained utilizing NASCET criteria, using the distal internal carotid diameter as the denominator. CONTRAST:  56mL ISOVUE-370 IOPAMIDOL (ISOVUE-370) INJECTION 76% COMPARISON:  Head CT without contrast 1354 hours today. Brain MRI 03/24/2017. Chest CT 10/14/2017. FINDINGS: CTA NECK Skeleton: Prior sternotomy. Osteopenia. No acute osseous abnormality identified. Upper chest: Increased chronic loculated left pleural effusion, increased fluid volume tracking into the left apex and along the left major fissure now. Associated  increased compressive left lung atelectasis. Associated increased mass effect on left lower lobe airways. The other central airways remain patent. Increased layering right pleural effusion, now up to moderate. Associated increased right lower lobe compressive atelectasis. Stable visible cardiac size.  Prior CABG. Other neck: Negative.  No neck mass no lymphadenopathy. Aortic arch: Calcified aortic atherosclerosis. Three vessel arch configuration. Right carotid system: No brachiocephalic artery or right CCA origin stenosis despite plaque. Tortuous right CCA origin with a mildly kinked appearance. Bulky calcified atherosclerosis at the right carotid bifurcation, but no significant proximal right ICA stenosis. Left carotid system: No left CCA origin stenosis despite plaque. Calcified plaque at the left ICA origin and bulb without hemodynamically significant stenosis. Vertebral arteries: No proximal right subclavian artery stenosis despite plaque. Calcified plaque at the right vertebral artery origin with mild stenosis. Tortuous right vertebral artery is patent to the skull base without additional stenosis. No proximal left subclavian artery stenosis despite plaque. Calcified plaque at the left vertebral artery origin with moderate to severe stenosis (series 8, image 107). The vertebral arteries are fairly codominant. The left remains patent to the skull base without additional stenosis. CTA HEAD Posterior circulation: Patent distal vertebral arteries without stenosis, the right is mildly dominant. Patent PICA origins and vertebrobasilar junction. Patent basilar artery without stenosis. Patent SCA and left PCA origins. Fetal type right PCA origin. Left Posterior communicating artery is diminutive or absent. Bilateral PCA branches are within normal limits. Anterior circulation: The left ICA siphon is patent with mild proximal and moderate supraclinoid left  ICA calcified plaque. Mild supraclinoid left ICA stenosis. Patent  left carotid terminus. Patent left MCA origin and M1 segment but clot visible at the left MCA bifurcation (series 11, image 23 and series 10, image 24). Subsequent paucity of left MCA M2 branches, particularly the middle sylvian branches. There is some reconstitution of the most anterior and posterior branches. Right ICA siphon is patent with mild to moderate calcified plaque and mild stenosis. Right ICA terminus, right MCA and both ACA origins are patent and within normal limits. Anterior communicating artery and bilateral ACA branches are within normal limits, median artery of the corpus callosum (normal variant). Right MCA M1 segment, right MCA bifurcation and right MCA branches are within normal limits. Venous sinuses: Early contrast timing but there does appear to be enhancement of the major dural venous sinuses. Anatomic variants: Mildly dominant distal right vertebral artery. Fetal type right PCA origin. Median artery of the corpus callosum. Review of the MIP images confirms the above findings IMPRESSION: 1. Positive for emergent large vessel occlusion at the left MCA bifurcation. This was communicated to Dr. Rory Percy at 1424 hours by text page via the [facility] messaging system. 2. Superimposed anterior circulation calcified plaque without other hemodynamically significant stenosis. 3. Moderate to severe left and mild-to-moderate right vertebral artery origin stenosis due to calcified plaque. Otherwise negative posterior circulation. 4. Increased chronic bilateral pleural effusions since May, loculated on the left. Associated increased bilateral compressive pulmonary atelectasis. 5.  Aortic Atherosclerosis (ICD10-I70.0). Electronically Signed   By: Genevie Ann M.D.   On: 01/04/2018 14:36   Ct Head Code Stroke Wo Contrast  Result Date: 01/04/2018 CLINICAL DATA:  Code stroke. 84 year old male with right side deficits. EXAM: CT HEAD WITHOUT CONTRAST TECHNIQUE: Contiguous axial images were obtained from the base of the  skull through the vertex without intravenous contrast. COMPARISON:  Brain MRI 03/24/2017.  Head CT 01/22/2016. FINDINGS: Brain: Stable cerebral volume since 2017. Chronic lacunar infarct right thalamus. Chronic but increased Patchy and confluent bilateral cerebral white matter hypodensity. Chronic basal ganglia dystrophic calcifications. No midline shift, ventriculomegaly, mass effect, evidence of mass lesion, intracranial hemorrhage or evidence of cortically based acute infarction. Vascular: Calcified atherosclerosis at the skull base. No suspicious intracranial vascular hyperdensity. Skull: No acute osseous abnormality identified. Sinuses/Orbits: Stable to improved paranasal sinus aeration since 2017. Mastoids and tympanic cavities remain clear. Other: Visualized orbit soft tissues are within normal limits. No acute scalp soft tissue findings. ASPECTS [facility] Stroke Program Early CT Score) 7 - Ganglionic level infarction (caudate, lentiform nuclei, internal capsule, insula, M1-M3 cortex): 10 - Supraganglionic infarction (M4-M6 cortex): 3 Total score (0-10 with 10 being normal): IMPRESSION: 1. No acute cortically based infarct or acute intracranial hemorrhage identified. ASPECTS is 10. 2. Chronic small vessel disease appears progressed by CT since 2017. 3. These results were communicated to Dr. Rory Percy at 2:04 pmon 8/18/2019by text page via the [facility] messaging system. Electronically Signed   By: Genevie Ann M.D.   On: 01/04/2018 14:04    Laurey Morale, MSN, NP-C [facility] [phone]  01/04/2018, 2:19 PM   Attending physician note to follow with Assessment and plan .  Attending addendum I have seen and examined the patient. I have reviewed CT scan-noncontrast head CT as well as CT Angie head and neck that shows a large vessel occlusion at the left MCA bifurcation.   Assessment: 84 y.o. male With PMH of HLD, CKD 3, HTN heart disease, metastatic lung adenocarcinoma who presented to [facility] via  EMS for unresponsiveness.  Code stroke was activated by ED for unresponsiveness and right-sided weakness. Examination consistent with a left MCA syndrome. Ct head: No acute cortically based infarct or acute intracranial. CTA: Showed a L MCA occlusion.  I had a detailed discussion with the wife, daughter and one other family member at bedside. The patient had expressed wishes to be comfortable in his apartment and had deferred home hospice decision on his last palliative care appointment on 12/30/2017, hoping that he would not have to be made this soon.  The family is very clear that they do not want any aggressive interventions for him.  I explained to them the acuity of the situation as well as the potentially large stroke that he is going to have and will be left with large disability from the stroke.  They expressed that they would at this time want to only pursue comfort measures.  I discussed the options available for stroke treatment including IV TPA and endovascular thrombectomy and given the whole general health picture that he has been recently, the family has decided to not pursue any aggressive measures.  I relayed my plan to the ED provider.   Stroke Risk Factors - hypercoagulable state, hyperlipidemia and age   Plan: Comfort measures per ED. Please call with questions  -- Amie Portland, MD [facility] Pager: [phone] If 7pm to 7am, please call on call as listed on AMION.  CRITICAL CARE ATTESTATION This patient is critically ill and at significant risk of neurological worsening, death and care requires constant monitoring of vital signs, hemodynamics,respiratory and cardiac monitoring. I spent 45  minutes of neurocritical care time performing neurological assessment, discussion with family, other specialists and medical decision making of high complexityin the care of  this patient.

## 2018-01-04 NOTE — ED Provider Notes (Signed)
[facility] EMERGENCY DEPARTMENT Provider Note   CSN: [id number] Arrival date & time: 01/04/18  1330     History   Chief Complaint Chief Complaint  Patient presents with  . Altered Mental Status    HPI Scott [name] is a 84 y.o. male.  HPI Level 5 caveat for nonverbal patient.  84 year old male with history of CAD, CKD, lung cancer comes in with chief complaint of altered mental status.  Patient lives at home with his wife.  He was last seen normal at 11:00 in the morning.  According to the wife when she returned home, patient was slumped in his chair and was unresponsive.  EMS team noted that patient had right-sided weakness.  Besides right upper extremity weakness, patient also has upgoing Babinski and right-sided neglect.  Code stroke was activated.  Past Medical History:  Diagnosis Date  . Anxiety   . Bradycardia    a. BB d/c'd in 2016.  [name] CAD (coronary artery disease)    a. 1983 s/p CABG [facility], [place]);  b. 2007 NSTEMI->med Rx for distal LCX dzs; c. 09/2014 NSTEMI/Cath: LIMA->LAD & diag ok, native RCA dominant and patent.   VG->RCA 100, VG->LCX 100 -->Med Rx.  . CKD (chronic kidney disease), stage III ([place])   . Claudication ([place])    BLE  . Depression   . Diastolic dysfunction    a. 10/2014 Echo: F 60-65%, Gr1 DD, mild AS, mild AI, mild to mod MR, PASP 19mmHg.  [name] Dyspnea 2010   syndrome - extensive  . Erectile dysfunction   . GERD (gastroesophageal reflux disease)   . H/O hiatal hernia   . History of blood transfusion 2014  . Hx of adenomatous colonic polyps 2007  . Hyperlipidemia   . Hypertensive heart disease   . Inguinal hernia   . Iron deficiency anemia   . Lung cancer ([place])   . Shingles 1990's  . Sigmoid diverticulosis   . Upper GI bleeding 2014   Gastritis and possible duodenal ulcer seen on EGD, felt secondary to NSAIDs  . Urethral stricture 11/2008  . Vasovagal syncope 05/2006  . Weight loss     Patient Active Problem List     Diagnosis Date Noted  . Acute gastritis 12/18/2017  . Generalized weakness 12/18/2017  . Urinary hesitancy 10/30/2017  . Hematuria 10/28/2017  . Palliative care by specialist   . NSTEMI (non-ST elevated myocardial infarction) ([place]) 10/05/2017  . CKD (chronic kidney disease), stage III ([place]) 10/05/2017  . Chronic diastolic CHF (congestive heart failure) ([place]) 10/05/2017  . Physical deconditioning 09/17/2017  . Hypoxemia 09/17/2017  . Recurrent left pleural effusion 08/12/2017  . Internal hemorrhoids 08/12/2017  . Goals of care, counseling/discussion 08/12/2017  . Fall 07/16/2017  . Primary adenocarcinoma of upper lobe of left lung ([place]) 04/07/2017  . Decreased libido 08/26/2016  . Bradycardia, toprol stopped 10/15/2014  . CAD (coronary artery disease) of artery bypass graft, ALL VGs occluded, patent LIMA -->LAD, patent dominant RCA 10/15/2014  . Anemia 03/24/2013  . Essential hypertension   . Anxiety   . Depression   . GERD (gastroesophageal reflux disease)   . Hyperlipidemia   . Coronary artery disease due to lipid rich plaque   . Diaphragmatic hernia 09/21/2008  . Dyspnea and respiratory abnormality 09/21/2008  . CT, CHEST, ABNORMAL 09/21/2008    Past Surgical History:  Procedure Laterality Date  . CARDIAC CATHETERIZATION  2007   Left main 100%, RCA 70% ostial, all grafts were patent, EF 40-45 percent,  distal circumflex subtotal after graft insertion, medical therapy   . CARDIAC CATHETERIZATION N/A 10/14/2014   Procedure: Left Heart Cath and Cors/Grafts Angiography;  Surgeon: Lorretta Harp, MD;  Location: [facility];  Service: Cardiovascular;  Laterality: N/A;  . CHOLECYSTECTOMY  1980's  . CORONARY ARTERY BYPASS GRAFT  1994    LIMA-LAD, SVG-D1, SVG-OM,  SVG-RCA; performed in [place], [place]  . ESOPHAGOGASTRODUODENOSCOPY N/A 03/25/2013   Procedure: ESOPHAGOGASTRODUODENOSCOPY (EGD);  Surgeon: Missy Sabins, MD;  gastritis, cannot rule out duodenal ulcer   .  INGUINAL HERNIA REPAIR Left   . IR THORACENTESIS ASP PLEURAL SPACE W/IMG GUIDE  10/07/2017  . TRANSURETHRAL RESECTION OF PROSTATE          Home Medications    Prior to Admission medications   Medication Sig Start Date End Date Taking? Authorizing Provider  acetaminophen (TYLENOL) 500 MG tablet Take 1 tablet (500 mg total) by mouth every 6 (six) hours as needed for mild pain or moderate pain. 11/06/17  Yes Mast, [name], NP  albuterol (PROAIR HFA) 108 (90 Base) MCG/ACT inhaler Inhale 2 puffs into the lungs every 6 (six) hours as needed for wheezing or shortness of breath. 11/06/17  Yes Mast, [name], NP  atorvastatin (LIPITOR) 40 MG tablet Take 1 tablet (40 mg total) by mouth daily at 6 PM. 11/13/17  Yes Mast, [name], NP  BREO ELLIPTA 100-25 MCG/INH AEPB Inhale 1 puff into the lungs daily. 11/06/17  Yes Mast, [name], NP  buPROPion (WELLBUTRIN SR) 150 MG 12 hr tablet Take 1 tablet (150 mg total) by mouth daily. 11/06/17  Yes Mast, [name], NP  Desvenlafaxine Succinate ER 25 MG TB24 Take 25 mg by mouth daily. 11/06/17  Yes Mast, [name], NP  FERROCITE 324 MG TABS tablet Take 1 tablet (106 mg of iron total) by mouth daily. 11/13/17  Yes Mast, [name], NP  isosorbide mononitrate (IMDUR) 30 MG 24 hr tablet Take 1 tablet (30 mg total) by mouth daily. 11/06/17  Yes Mast, [name], NP  nitroGLYCERIN (NITROSTAT) 0.4 MG SL tablet Place 1 tablet (0.4 mg total) under the tongue every 5 (five) minutes x 3 doses as needed for chest pain. 11/06/17  Yes Mast, [name], NP  Omega-3 Fatty Acids (FISH OIL) 1000 MG CAPS Take 1 capsule (1,000 mg total) by mouth daily. 11/06/17  Yes Mast, [name], NP  osimertinib mesylate (TAGRISSO) 80 MG tablet TAKE 1 TABLET (80 MG TOTAL) BY MOUTH DAILY. 11/18/17  Yes Brunetta Genera, MD  OXYGEN Inhale 2 L into the lungs continuous.   Yes [provider]  pantoprazole (PROTONIX) 40 MG tablet Take 1 tablet (40 mg total) by mouth 2 (two) times daily before a meal. Patient taking differently: Take 40  mg by mouth 2 (two) times daily.  11/06/17  Yes Mast, [name], NP  vitamin B-12 (CYANOCOBALAMIN) 500 MCG tablet Take 1 tablet (500 mcg total) by mouth daily. complex 11/06/17  Yes Mast, [name], NP  clopidogrel (PLAVIX) 75 MG tablet Take 1 tablet (75 mg total) by mouth daily. Patient not taking: Reported on 01/04/2018 10/08/17   Scott Osgood, MD    Family History Family History  Problem Relation Age of Onset  . Hypertension Mother   . Heart disease Mother   . Heart disease Father   . Heart failure Brother   . Heart failure Sister   . Heart failure Brother   . Heart attack Neg Hx   .  Stroke Neg Hx     Social History Social History   Tobacco Use  . Smoking status: Never Smoker  . Smokeless tobacco: Never Used  Substance Use Topics  . Alcohol use: Not Currently    Alcohol/week: 7.0 standard drinks    Types: 7 Glasses of wine per week    Comment: wine - daily - currently not drinking  . Drug use: No     Allergies   Patient has no known allergies.   Review of Systems Review of Systems  Unable to perform ROS: Patient nonverbal     Physical Exam Updated Vital Signs BP (!) 144/82   Pulse 80   Temp (!) 97.1 F (36.2 C)   Resp 19   Ht 5\' 6"  (1.676 m)   Wt 68 kg   SpO2 100%   BMI 24.20 kg/m   Physical Exam  Constitutional:  Cachectic patient  HENT:  Head: Atraumatic.  Eyes: Pupils are equal, round, and reactive to light.  Cardiovascular: Normal rate.  Pulmonary/Chest: Effort normal.  Abdominal: Soft.  Neurological:  Right upper extremity is paralyzed. Upgoing Babinski Right-sided hemineglect  Skin: Skin is warm.  Nursing note and vitals reviewed.    ED Treatments / Results  Labs (all labs ordered are listed, but only abnormal results are displayed) Labs Reviewed  CBC - Abnormal; Notable for the following components:      Result Value   RBC 4.08 (*)    Hemoglobin 11.1 (*)    HCT 37.1 (*)    MCHC 29.9 (*)    All other components within normal  limits  DIFFERENTIAL - Abnormal; Notable for the following components:   Neutro Abs 7.9 (*)    Lymphs Abs 0.6 (*)    All other components within normal limits  COMPREHENSIVE METABOLIC PANEL - Abnormal; Notable for the following components:   Glucose, Bld 121 (*)    BUN 37 (*)    Creatinine, Ser 1.93 (*)    Albumin 3.2 (*)    GFR calc non Af Amer 28 (*)    GFR calc Af Amer 33 (*)    All other components within normal limits  I-STAT CHEM 8, ED - Abnormal; Notable for the following components:   BUN 40 (*)    Creatinine, Ser 1.90 (*)    Glucose, Bld 118 (*)    Calcium, Ion 1.13 (*)    Hemoglobin 11.9 (*)    HCT 35.0 (*)    All other components within normal limits  I-STAT TROPONIN, ED - Abnormal; Notable for the following components:   Troponin i, poc 0.83 (*)    All other components within normal limits  PROTIME-INR  APTT  RAPID URINE DRUG SCREEN, HOSP PERFORMED  URINALYSIS, ROUTINE W REFLEX MICROSCOPIC    EKG EKG Interpretation  Date/Time:  Sunday January 04 2018 14:19:39 EDT Ventricular Rate:  79 PR Interval:    QRS Duration: 98 QT Interval:  423 QTC Calculation: 485 R Axis:   73 Text Interpretation:  Sinus rhythm Anteroseptal infarct, old No acute changes No significant change since last tracing Confirmed by Varney Biles [phone]) on 01/04/2018 3:04:39 PM   Radiology Ct Angio Head W Or Wo Contrast  Result Date: 01/04/2018 CLINICAL DATA:  84 year old male code stroke. Right side weakness. History of lung cancer. EXAM: CT ANGIOGRAPHY HEAD AND NECK TECHNIQUE: Multidetector CT imaging of the head and neck was performed using the standard protocol during bolus administration of intravenous contrast. Multiplanar CT image reconstructions and MIPs were  obtained to evaluate the vascular anatomy. Carotid stenosis measurements (when applicable) are obtained utilizing NASCET criteria, using the distal internal carotid diameter as the denominator. CONTRAST:  33mL ISOVUE-370 IOPAMIDOL  (ISOVUE-370) INJECTION 76% COMPARISON:  Head CT without contrast 1354 hours today. Brain MRI 03/24/2017. Chest CT 10/14/2017. FINDINGS: CTA NECK Skeleton: Prior sternotomy. Osteopenia. No acute osseous abnormality identified. Upper chest: Increased chronic loculated left pleural effusion, increased fluid volume tracking into the left apex and along the left major fissure now. Associated increased compressive left lung atelectasis. Associated increased mass effect on left lower lobe airways. The other central airways remain patent. Increased layering right pleural effusion, now up to moderate. Associated increased right lower lobe compressive atelectasis. Stable visible cardiac size.  Prior CABG. Other neck: Negative.  No neck mass no lymphadenopathy. Aortic arch: Calcified aortic atherosclerosis. Three vessel arch configuration. Right carotid system: No brachiocephalic artery or right CCA origin stenosis despite plaque. Tortuous right CCA origin with a mildly kinked appearance. Bulky calcified atherosclerosis at the right carotid bifurcation, but no significant proximal right ICA stenosis. Left carotid system: No left CCA origin stenosis despite plaque. Calcified plaque at the left ICA origin and bulb without hemodynamically significant stenosis. Vertebral arteries: No proximal right subclavian artery stenosis despite plaque. Calcified plaque at the right vertebral artery origin with mild stenosis. Tortuous right vertebral artery is patent to the skull base without additional stenosis. No proximal left subclavian artery stenosis despite plaque. Calcified plaque at the left vertebral artery origin with moderate to severe stenosis (series 8, image 107). The vertebral arteries are fairly codominant. The left remains patent to the skull base without additional stenosis. CTA HEAD Posterior circulation: Patent distal vertebral arteries without stenosis, the right is mildly dominant. Patent PICA origins and vertebrobasilar  junction. Patent basilar artery without stenosis. Patent SCA and left PCA origins. Fetal type right PCA origin. Left Posterior communicating artery is diminutive or absent. Bilateral PCA branches are within normal limits. Anterior circulation: The left ICA siphon is patent with mild proximal and moderate supraclinoid left ICA calcified plaque. Mild supraclinoid left ICA stenosis. Patent left carotid terminus. Patent left MCA origin and M1 segment but clot visible at the left MCA bifurcation (series 11, image 23 and series 10, image 24). Subsequent paucity of left MCA M2 branches, particularly the middle sylvian branches. There is some reconstitution of the most anterior and posterior branches. Right ICA siphon is patent with mild to moderate calcified plaque and mild stenosis. Right ICA terminus, right MCA and both ACA origins are patent and within normal limits. Anterior communicating artery and bilateral ACA branches are within normal limits, median artery of the corpus callosum (normal variant). Right MCA M1 segment, right MCA bifurcation and right MCA branches are within normal limits. Venous sinuses: Early contrast timing but there does appear to be enhancement of the major dural venous sinuses. Anatomic variants: Mildly dominant distal right vertebral artery. Fetal type right PCA origin. Median artery of the corpus callosum. Review of the MIP images confirms the above findings IMPRESSION: 1. Positive for emergent large vessel occlusion at the left MCA bifurcation. This was communicated to Dr. Rory Percy at 1424 hours by text page via the [facility] messaging system. 2. Superimposed anterior circulation calcified plaque without other hemodynamically significant stenosis. 3. Moderate to severe left and mild-to-moderate right vertebral artery origin stenosis due to calcified plaque. Otherwise negative posterior circulation. 4. Increased chronic bilateral pleural effusions since May, loculated on the left. Associated  increased bilateral compressive pulmonary atelectasis. 5.  Aortic Atherosclerosis (ICD10-I70.0). Electronically Signed   By: Genevie Ann M.D.   On: 01/04/2018 14:36   Ct Angio Neck W Or Wo Contrast  Result Date: 01/04/2018 CLINICAL DATA:  84 year old male code stroke. Right side weakness. History of lung cancer. EXAM: CT ANGIOGRAPHY HEAD AND NECK TECHNIQUE: Multidetector CT imaging of the head and neck was performed using the standard protocol during bolus administration of intravenous contrast. Multiplanar CT image reconstructions and MIPs were obtained to evaluate the vascular anatomy. Carotid stenosis measurements (when applicable) are obtained utilizing NASCET criteria, using the distal internal carotid diameter as the denominator. CONTRAST:  7mL ISOVUE-370 IOPAMIDOL (ISOVUE-370) INJECTION 76% COMPARISON:  Head CT without contrast 1354 hours today. Brain MRI 03/24/2017. Chest CT 10/14/2017. FINDINGS: CTA NECK Skeleton: Prior sternotomy. Osteopenia. No acute osseous abnormality identified. Upper chest: Increased chronic loculated left pleural effusion, increased fluid volume tracking into the left apex and along the left major fissure now. Associated increased compressive left lung atelectasis. Associated increased mass effect on left lower lobe airways. The other central airways remain patent. Increased layering right pleural effusion, now up to moderate. Associated increased right lower lobe compressive atelectasis. Stable visible cardiac size.  Prior CABG. Other neck: Negative.  No neck mass no lymphadenopathy. Aortic arch: Calcified aortic atherosclerosis. Three vessel arch configuration. Right carotid system: No brachiocephalic artery or right CCA origin stenosis despite plaque. Tortuous right CCA origin with a mildly kinked appearance. Bulky calcified atherosclerosis at the right carotid bifurcation, but no significant proximal right ICA stenosis. Left carotid system: No left CCA origin stenosis despite  plaque. Calcified plaque at the left ICA origin and bulb without hemodynamically significant stenosis. Vertebral arteries: No proximal right subclavian artery stenosis despite plaque. Calcified plaque at the right vertebral artery origin with mild stenosis. Tortuous right vertebral artery is patent to the skull base without additional stenosis. No proximal left subclavian artery stenosis despite plaque. Calcified plaque at the left vertebral artery origin with moderate to severe stenosis (series 8, image 107). The vertebral arteries are fairly codominant. The left remains patent to the skull base without additional stenosis. CTA HEAD Posterior circulation: Patent distal vertebral arteries without stenosis, the right is mildly dominant. Patent PICA origins and vertebrobasilar junction. Patent basilar artery without stenosis. Patent SCA and left PCA origins. Fetal type right PCA origin. Left Posterior communicating artery is diminutive or absent. Bilateral PCA branches are within normal limits. Anterior circulation: The left ICA siphon is patent with mild proximal and moderate supraclinoid left ICA calcified plaque. Mild supraclinoid left ICA stenosis. Patent left carotid terminus. Patent left MCA origin and M1 segment but clot visible at the left MCA bifurcation (series 11, image 23 and series 10, image 24). Subsequent paucity of left MCA M2 branches, particularly the middle sylvian branches. There is some reconstitution of the most anterior and posterior branches. Right ICA siphon is patent with mild to moderate calcified plaque and mild stenosis. Right ICA terminus, right MCA and both ACA origins are patent and within normal limits. Anterior communicating artery and bilateral ACA branches are within normal limits, median artery of the corpus callosum (normal variant). Right MCA M1 segment, right MCA bifurcation and right MCA branches are within normal limits. Venous sinuses: Early contrast timing but there does  appear to be enhancement of the major dural venous sinuses. Anatomic variants: Mildly dominant distal right vertebral artery. Fetal type right PCA origin. Median artery of the corpus callosum. Review of the MIP images confirms the above findings IMPRESSION: 1. Positive for  emergent large vessel occlusion at the left MCA bifurcation. This was communicated to Dr. Rory Percy at 1424 hours by text page via the [facility] messaging system. 2. Superimposed anterior circulation calcified plaque without other hemodynamically significant stenosis. 3. Moderate to severe left and mild-to-moderate right vertebral artery origin stenosis due to calcified plaque. Otherwise negative posterior circulation. 4. Increased chronic bilateral pleural effusions since May, loculated on the left. Associated increased bilateral compressive pulmonary atelectasis. 5.  Aortic Atherosclerosis (ICD10-I70.0). Electronically Signed   By: Genevie Ann M.D.   On: 01/04/2018 14:36   Ct Head Code Stroke Wo Contrast  Result Date: 01/04/2018 CLINICAL DATA:  Code stroke. 84 year old male with right side deficits. EXAM: CT HEAD WITHOUT CONTRAST TECHNIQUE: Contiguous axial images were obtained from the base of the skull through the vertex without intravenous contrast. COMPARISON:  Brain MRI 03/24/2017.  Head CT 01/22/2016. FINDINGS: Brain: Stable cerebral volume since 2017. Chronic lacunar infarct right thalamus. Chronic but increased Patchy and confluent bilateral cerebral white matter hypodensity. Chronic basal ganglia dystrophic calcifications. No midline shift, ventriculomegaly, mass effect, evidence of mass lesion, intracranial hemorrhage or evidence of cortically based acute infarction. Vascular: Calcified atherosclerosis at the skull base. No suspicious intracranial vascular hyperdensity. Skull: No acute osseous abnormality identified. Sinuses/Orbits: Stable to improved paranasal sinus aeration since 2017. Mastoids and tympanic cavities remain clear. Other:  Visualized orbit soft tissues are within normal limits. No acute scalp soft tissue findings. ASPECTS [facility] Stroke Program Early CT Score) 7 - Ganglionic level infarction (caudate, lentiform nuclei, internal capsule, insula, M1-M3 cortex): 10 - Supraganglionic infarction (M4-M6 cortex): 3 Total score (0-10 with 10 being normal): IMPRESSION: 1. No acute cortically based infarct or acute intracranial hemorrhage identified. ASPECTS is 10. 2. Chronic small vessel disease appears progressed by CT since 2017. 3. These results were communicated to Dr. Rory Percy at 2:04 pmon 8/18/2019by text page via the [facility] messaging system. Electronically Signed   By: Genevie Ann M.D.   On: 01/04/2018 14:04    Procedures Procedures (including critical care time)  Medications Ordered in ED Medications  iopamidol (ISOVUE-370) 76 % injection 50 mL (50 mLs Intravenous Contrast Given 01/04/18 1414)     Initial Impression / Assessment and Plan / ED Course  I have reviewed the triage vital signs and the nursing notes.  Pertinent labs & imaging results that were available during my care of the patient were reviewed by me and considered in my medical decision making (see chart for details).  Clinical Course as of Jan 04 1509  Sun Jan 04, 2018  1456 Family is opted for comfort care and not escalating care at this time.  Therefore despite having a left LVO, we will not proceed with endovascular treatment.  Family has requested that we try and see if patient can be transferred to hospice from the ED.  I spoke with RN Clifton James at [facility]. She will pass the patient's information to the day team.  They cannot accept the patient this evening, and are requesting that patient be admitted.   [AN]    Clinical Course User Index [AN] Varney Biles, MD    84 year old male with history of CAD, CKD comes in with chief complaint of right-sided weakness. Last normal at 11:00 AM. Patient is DNR, DNI.  He is not  hospice/palliative care.  Code stroke activated on arrival.  Neuro team will decide on CT angio after assessing the patient.  Final Clinical Impressions(s) / ED Diagnoses   Final diagnoses:  Acute  ischemic left MCA stroke [facility])    ED Discharge Orders    None       Varney Biles, MD 01/04/18 1510

## 2018-01-04 NOTE — Plan of Care (Signed)
Spoke with hospitalist team, no need to see patient as request for hospice home placement is in place. Rec for Morphine.

## 2018-01-04 NOTE — Care Management (Addendum)
Patient is a resident at [facility] [phone] and is active with [facility] last visit was 8/13

## 2018-01-04 NOTE — H&P (Signed)
History and Physical    Scott [name]:[id number] DOB: 1923-05-27 DOA: 01/04/2018  PCP: [name], MD Patient coming from: home  Chief Complaint: Right sided weakness  HPI: Scott [name] is a 84 y.o. male with medical history significant of with past medical history of metastatic adenocarcinoma of his lung, essential hypertension, GERD, CKD stage III comes to the hospital after being found unresponsive.  Wife went to church this morning and patient was in his usual state but when she returned he found patient unresponsive and unable to speak.  He appeared extremely weak therefore called EMS.  EMS brought the patient to the hospital for further care and management.  In the ER patient was not moving his right upper and lower extremity.  CT of the head was negative for acute pathology but CTA of the head and neck showed positive large emergent occlusion of the left MCA.  After a prolonged discussion by the ER provider with the family including his current diagnosis and chronic comorbidities family decided to transition patient to comfort care.  ER provider got in touch with arrange for a palliative care team who stated they will come see the patient tomorrow as they do not have capability to admit the patient there today.  Medical team was requested to admit the patient overnight and transition him to comfort care tomorrow.  When I saw the patient he was laying in the bed and unresponsive but was moving his left upper and lower extremity spontaneously.  No movement noted on the right side.  Vital signs were stable.   Review of Systems: As per HPI otherwise 10 point review of systems negative.  Review of Systems Unable to obtain this from the patient due to his mental status  Past Medical History:  Diagnosis Date  . Anxiety   . Bradycardia    a. BB d/c'd in 2016.  [name] CAD (coronary artery disease)    a. 1983 s/p CABG [facility] [place]);  b. 2007 NSTEMI->med Rx for distal LCX dzs; c.  09/2014 NSTEMI/Cath: LIMA->LAD & diag ok, native RCA dominant and patent.   VG->RCA 100, VG->LCX 100 -->Med Rx.  . CKD (chronic kidney disease), stage III ([place])   . Claudication ([place])    BLE  . Depression   . Diastolic dysfunction    a. 10/2014 Echo: F 60-65%, Gr1 DD, mild AS, mild AI, mild to mod MR, PASP 77mmHg.  [name] Dyspnea 2010   syndrome - extensive  . Erectile dysfunction   . GERD (gastroesophageal reflux disease)   . H/O hiatal hernia   . History of blood transfusion 2014  . Hx of adenomatous colonic polyps 2007  . Hyperlipidemia   . Hypertensive heart disease   . Inguinal hernia   . Iron deficiency anemia   . Lung cancer ([place])   . Shingles 1990's  . Sigmoid diverticulosis   . Upper GI bleeding 2014   Gastritis and possible duodenal ulcer seen on EGD, felt secondary to NSAIDs  . Urethral stricture 11/2008  . Vasovagal syncope 05/2006  . Weight loss     Past Surgical History:  Procedure Laterality Date  . CARDIAC CATHETERIZATION  2007   Left main 100%, RCA 70% ostial, all grafts were patent, EF 40-45 percent, distal circumflex subtotal after graft insertion, medical therapy   . CARDIAC CATHETERIZATION N/A 10/14/2014   Procedure: Left Heart Cath and Cors/Grafts Angiography;  Surgeon: Lorretta Harp, MD;  Location: [place] CV LAB;  Service: Cardiovascular;  Laterality: N/A;  .  CHOLECYSTECTOMY  1980's  . CORONARY ARTERY BYPASS GRAFT  1994    LIMA-LAD, SVG-D1, SVG-OM,  SVG-RCA; performed in [place], [place]  . ESOPHAGOGASTRODUODENOSCOPY N/A 03/25/2013   Procedure: ESOPHAGOGASTRODUODENOSCOPY (EGD);  Surgeon: Missy Sabins, MD;  gastritis, cannot rule out duodenal ulcer   . INGUINAL HERNIA REPAIR Left   . IR THORACENTESIS ASP PLEURAL SPACE W/IMG GUIDE  10/07/2017  . TRANSURETHRAL RESECTION OF PROSTATE      SOCIAL HISTORY:  reports that he has never smoked. He has never used smokeless tobacco. He reports that he drank about 7.0 standard drinks of alcohol per week. He  reports that he does not use drugs.  No Known Allergies  FAMILY HISTORY: Family History  Problem Relation Age of Onset  . Hypertension Mother   . Heart disease Mother   . Heart disease Father   . Heart failure Brother   . Heart failure Sister   . Heart failure Brother   . Heart attack Neg Hx   . Stroke Neg Hx      Prior to Admission medications   Medication Sig Start Date End Date Taking? Authorizing Provider  acetaminophen (TYLENOL) 500 MG tablet Take 1 tablet (500 mg total) by mouth every 6 (six) hours as needed for mild pain or moderate pain. 11/06/17  Yes Mast, [name], NP  albuterol (PROAIR HFA) 108 (90 Base) MCG/ACT inhaler Inhale 2 puffs into the lungs every 6 (six) hours as needed for wheezing or shortness of breath. 11/06/17  Yes Mast, [name], NP  atorvastatin (LIPITOR) 40 MG tablet Take 1 tablet (40 mg total) by mouth daily at 6 PM. 11/13/17  Yes Mast, [name], NP  BREO ELLIPTA 100-25 MCG/INH AEPB Inhale 1 puff into the lungs daily. 11/06/17  Yes Mast, [name], NP  buPROPion (WELLBUTRIN SR) 150 MG 12 hr tablet Take 1 tablet (150 mg total) by mouth daily. 11/06/17  Yes Mast, [name], NP  Desvenlafaxine Succinate ER 25 MG TB24 Take 25 mg by mouth daily. 11/06/17  Yes Mast, [name], NP  FERROCITE 324 MG TABS tablet Take 1 tablet (106 mg of iron total) by mouth daily. 11/13/17  Yes Mast, [name], NP  isosorbide mononitrate (IMDUR) 30 MG 24 hr tablet Take 1 tablet (30 mg total) by mouth daily. 11/06/17  Yes Mast, [name], NP  nitroGLYCERIN (NITROSTAT) 0.4 MG SL tablet Place 1 tablet (0.4 mg total) under the tongue every 5 (five) minutes x 3 doses as needed for chest pain. 11/06/17  Yes Mast, [name], NP  Omega-3 Fatty Acids (FISH OIL) 1000 MG CAPS Take 1 capsule (1,000 mg total) by mouth daily. 11/06/17  Yes Mast, [name], NP  osimertinib mesylate (TAGRISSO) 80 MG tablet TAKE 1 TABLET (80 MG TOTAL) BY MOUTH DAILY. 11/18/17  Yes Brunetta Genera, MD  OXYGEN Inhale 2 L into the lungs continuous.   Yes  [provider]  pantoprazole (PROTONIX) 40 MG tablet Take 1 tablet (40 mg total) by mouth 2 (two) times daily before a meal. Patient taking differently: Take 40 mg by mouth 2 (two) times daily.  11/06/17  Yes Mast, [name], NP  vitamin B-12 (CYANOCOBALAMIN) 500 MCG tablet Take 1 tablet (500 mcg total) by mouth daily. complex 11/06/17  Yes Mast, [name], NP  clopidogrel (PLAVIX) 75 MG tablet Take 1 tablet (75 mg total) by mouth daily. Patient not taking: Reported on 01/04/2018 10/08/17   Jonetta Osgood, MD    Physical Exam: Vitals:  01/04/18 1332 01/04/18 1345 01/04/18 1415 01/04/18 1446  BP:  (!) 147/81 (!) 144/82   Pulse:  83 80   Resp:  (!) 24 19   Temp:   (!) 97.1 F (36.2 C) (!) 97.1 F (36.2 C)  TempSrc:   Oral   SpO2: 98% 100% 100%   Weight:   68 kg   Height:   5\' 6"  (1.676 m)       Constitutional: Elderly frail laying in the bed who was verbally nonresponsive. Eyes: PERRL, lids and conjunctivae normal ENMT: Mucous membranes are moist. Posterior pharynx clear of any exudate or lesions.Normal dentition.  Neck: normal, supple, no masses, no thyromegaly Respiratory: clear to auscultation bilaterally, no wheezing, no crackles. Normal respiratory effort. No accessory muscle use.  Cardiovascular: Regular rate and rhythm, no murmurs / rubs / gallops. No extremity edema. 2+ pedal pulses. No carotid bruits.  Abdomen:  no masses palpated. No hepatosplenomegaly. Bowel sounds positive.  Musculoskeletal: no clubbing / cyanosis. No joint deformity upper and lower extremities. Good ROM, no contractures. Normal muscle tone.  Skin: no rashes, lesions, ulcers. No induration Neurologic: Unable to thoroughly assess but he is moving his left upper and lower extremities voluntarily.  No movement seen on the right upper and lower extremity. Psychiatric: Unable to assess    Labs on Admission: I have personally reviewed following labs and imaging studies  CBC: Recent Labs  Lab  01/04/18 1345 01/04/18 1350  WBC 9.2  --   NEUTROABS 7.9*  --   HGB 11.1* 11.9*  HCT 37.1* 35.0*  MCV 90.9  --   PLT 160  --    Basic Metabolic Panel: Recent Labs  Lab 01/04/18 1345 01/04/18 1350  NA 141 140  K 4.8 4.7  CL 107 107  CO2 24  --   GLUCOSE 121* 118*  BUN 37* 40*  CREATININE 1.93* 1.90*  CALCIUM 9.4  --    GFR: Estimated Creatinine Clearance: 22.4 mL/min (A) (by C-G formula based on SCr of 1.9 mg/dL (H)). Liver Function Tests: Recent Labs  Lab 01/04/18 1345  AST 35  ALT 35  ALKPHOS 85  BILITOT 0.8  PROT 6.7  ALBUMIN 3.2*   No results for input(s): LIPASE, AMYLASE in the last 168 hours. No results for input(s): AMMONIA in the last 168 hours. Coagulation Profile: Recent Labs  Lab 01/04/18 1345  INR 1.10   Cardiac Enzymes: No results for input(s): CKTOTAL, CKMB, CKMBINDEX, TROPONINI in the last 168 hours. BNP (last 3 results) No results for input(s): PROBNP in the last 8760 hours. HbA1C: No results for input(s): HGBA1C in the last 72 hours. CBG: No results for input(s): GLUCAP in the last 168 hours. Lipid Profile: No results for input(s): CHOL, HDL, LDLCALC, TRIG, CHOLHDL, LDLDIRECT in the last 72 hours. Thyroid Function Tests: No results for input(s): TSH, T4TOTAL, FREET4, T3FREE, THYROIDAB in the last 72 hours. Anemia Panel: No results for input(s): VITAMINB12, FOLATE, FERRITIN, TIBC, IRON, RETICCTPCT in the last 72 hours. Urine analysis:    Component Value Date/Time   COLORURINE YELLOW 04/09/2017 1400   APPEARANCEUR CLEAR 04/09/2017 1400   LABSPEC 1.018 04/09/2017 1400   PHURINE 6.0 04/09/2017 1400   GLUCOSEU NEGATIVE 04/09/2017 1400   HGBUR NEGATIVE 04/09/2017 1400   BILIRUBINUR NEGATIVE 04/09/2017 1400   KETONESUR NEGATIVE 04/09/2017 1400   PROTEINUR 5.7 10/21/2017   PROTEINUR NEGATIVE 04/09/2017 1400   UROBILINOGEN 0.2 08/15/2012 1215   NITRITE NEGATIVE 04/09/2017 1400   LEUKOCYTESUR NEGATIVE 04/09/2017 1400  Sepsis Labs:  !!!!!!!!!!!!!!!!!!!!!!!!!!!!!!!!!!!!!!!!!!!! @LABRCNTIP (procalcitonin:4,lacticidven:4) )No results found for this or any previous visit (from the past 240 hour(s)).   Radiological Exams on Admission: Ct Angio Head W Or Wo Contrast  Result Date: 01/04/2018 CLINICAL DATA:  84 year old male code stroke. Right side weakness. History of lung cancer. EXAM: CT ANGIOGRAPHY HEAD AND NECK TECHNIQUE: Multidetector CT imaging of the head and neck was performed using the standard protocol during bolus administration of intravenous contrast. Multiplanar CT image reconstructions and MIPs were obtained to evaluate the vascular anatomy. Carotid stenosis measurements (when applicable) are obtained utilizing NASCET criteria, using the distal internal carotid diameter as the denominator. CONTRAST:  11mL ISOVUE-370 IOPAMIDOL (ISOVUE-370) INJECTION 76% COMPARISON:  Head CT without contrast 1354 hours today. Brain MRI 03/24/2017. Chest CT 10/14/2017. FINDINGS: CTA NECK Skeleton: Prior sternotomy. Osteopenia. No acute osseous abnormality identified. Upper chest: Increased chronic loculated left pleural effusion, increased fluid volume tracking into the left apex and along the left major fissure now. Associated increased compressive left lung atelectasis. Associated increased mass effect on left lower lobe airways. The other central airways remain patent. Increased layering right pleural effusion, now up to moderate. Associated increased right lower lobe compressive atelectasis. Stable visible cardiac size.  Prior CABG. Other neck: Negative.  No neck mass no lymphadenopathy. Aortic arch: Calcified aortic atherosclerosis. Three vessel arch configuration. Right carotid system: No brachiocephalic artery or right CCA origin stenosis despite plaque. Tortuous right CCA origin with a mildly kinked appearance. Bulky calcified atherosclerosis at the right carotid bifurcation, but no significant proximal right ICA stenosis. Left carotid  system: No left CCA origin stenosis despite plaque. Calcified plaque at the left ICA origin and bulb without hemodynamically significant stenosis. Vertebral arteries: No proximal right subclavian artery stenosis despite plaque. Calcified plaque at the right vertebral artery origin with mild stenosis. Tortuous right vertebral artery is patent to the skull base without additional stenosis. No proximal left subclavian artery stenosis despite plaque. Calcified plaque at the left vertebral artery origin with moderate to severe stenosis (series 8, image 107). The vertebral arteries are fairly codominant. The left remains patent to the skull base without additional stenosis. CTA HEAD Posterior circulation: Patent distal vertebral arteries without stenosis, the right is mildly dominant. Patent PICA origins and vertebrobasilar junction. Patent basilar artery without stenosis. Patent SCA and left PCA origins. Fetal type right PCA origin. Left Posterior communicating artery is diminutive or absent. Bilateral PCA branches are within normal limits. Anterior circulation: The left ICA siphon is patent with mild proximal and moderate supraclinoid left ICA calcified plaque. Mild supraclinoid left ICA stenosis. Patent left carotid terminus. Patent left MCA origin and M1 segment but clot visible at the left MCA bifurcation (series 11, image 23 and series 10, image 24). Subsequent paucity of left MCA M2 branches, particularly the middle sylvian branches. There is some reconstitution of the most anterior and posterior branches. Right ICA siphon is patent with mild to moderate calcified plaque and mild stenosis. Right ICA terminus, right MCA and both ACA origins are patent and within normal limits. Anterior communicating artery and bilateral ACA branches are within normal limits, median artery of the corpus callosum (normal variant). Right MCA M1 segment, right MCA bifurcation and right MCA branches are within normal limits. Venous  sinuses: Early contrast timing but there does appear to be enhancement of the major dural venous sinuses. Anatomic variants: Mildly dominant distal right vertebral artery. Fetal type right PCA origin. Median artery of the corpus callosum. Review of the MIP images confirms the  above findings IMPRESSION: 1. Positive for emergent large vessel occlusion at the left MCA bifurcation. This was communicated to Dr. Rory Percy at 1424 hours by text page via the [facility] messaging system. 2. Superimposed anterior circulation calcified plaque without other hemodynamically significant stenosis. 3. Moderate to severe left and mild-to-moderate right vertebral artery origin stenosis due to calcified plaque. Otherwise negative posterior circulation. 4. Increased chronic bilateral pleural effusions since May, loculated on the left. Associated increased bilateral compressive pulmonary atelectasis. 5.  Aortic Atherosclerosis (ICD10-I70.0). Electronically Signed   By: Genevie Ann M.D.   On: 01/04/2018 14:36   Ct Angio Neck W Or Wo Contrast  Result Date: 01/04/2018 CLINICAL DATA:  84 year old male code stroke. Right side weakness. History of lung cancer. EXAM: CT ANGIOGRAPHY HEAD AND NECK TECHNIQUE: Multidetector CT imaging of the head and neck was performed using the standard protocol during bolus administration of intravenous contrast. Multiplanar CT image reconstructions and MIPs were obtained to evaluate the vascular anatomy. Carotid stenosis measurements (when applicable) are obtained utilizing NASCET criteria, using the distal internal carotid diameter as the denominator. CONTRAST:  10mL ISOVUE-370 IOPAMIDOL (ISOVUE-370) INJECTION 76% COMPARISON:  Head CT without contrast 1354 hours today. Brain MRI 03/24/2017. Chest CT 10/14/2017. FINDINGS: CTA NECK Skeleton: Prior sternotomy. Osteopenia. No acute osseous abnormality identified. Upper chest: Increased chronic loculated left pleural effusion, increased fluid volume tracking into the left  apex and along the left major fissure now. Associated increased compressive left lung atelectasis. Associated increased mass effect on left lower lobe airways. The other central airways remain patent. Increased layering right pleural effusion, now up to moderate. Associated increased right lower lobe compressive atelectasis. Stable visible cardiac size.  Prior CABG. Other neck: Negative.  No neck mass no lymphadenopathy. Aortic arch: Calcified aortic atherosclerosis. Three vessel arch configuration. Right carotid system: No brachiocephalic artery or right CCA origin stenosis despite plaque. Tortuous right CCA origin with a mildly kinked appearance. Bulky calcified atherosclerosis at the right carotid bifurcation, but no significant proximal right ICA stenosis. Left carotid system: No left CCA origin stenosis despite plaque. Calcified plaque at the left ICA origin and bulb without hemodynamically significant stenosis. Vertebral arteries: No proximal right subclavian artery stenosis despite plaque. Calcified plaque at the right vertebral artery origin with mild stenosis. Tortuous right vertebral artery is patent to the skull base without additional stenosis. No proximal left subclavian artery stenosis despite plaque. Calcified plaque at the left vertebral artery origin with moderate to severe stenosis (series 8, image 107). The vertebral arteries are fairly codominant. The left remains patent to the skull base without additional stenosis. CTA HEAD Posterior circulation: Patent distal vertebral arteries without stenosis, the right is mildly dominant. Patent PICA origins and vertebrobasilar junction. Patent basilar artery without stenosis. Patent SCA and left PCA origins. Fetal type right PCA origin. Left Posterior communicating artery is diminutive or absent. Bilateral PCA branches are within normal limits. Anterior circulation: The left ICA siphon is patent with mild proximal and moderate supraclinoid left ICA  calcified plaque. Mild supraclinoid left ICA stenosis. Patent left carotid terminus. Patent left MCA origin and M1 segment but clot visible at the left MCA bifurcation (series 11, image 23 and series 10, image 24). Subsequent paucity of left MCA M2 branches, particularly the middle sylvian branches. There is some reconstitution of the most anterior and posterior branches. Right ICA siphon is patent with mild to moderate calcified plaque and mild stenosis. Right ICA terminus, right MCA and both ACA origins are patent and within normal limits. Anterior  communicating artery and bilateral ACA branches are within normal limits, median artery of the corpus callosum (normal variant). Right MCA M1 segment, right MCA bifurcation and right MCA branches are within normal limits. Venous sinuses: Early contrast timing but there does appear to be enhancement of the major dural venous sinuses. Anatomic variants: Mildly dominant distal right vertebral artery. Fetal type right PCA origin. Median artery of the corpus callosum. Review of the MIP images confirms the above findings IMPRESSION: 1. Positive for emergent large vessel occlusion at the left MCA bifurcation. This was communicated to Dr. Rory Percy at 1424 hours by text page via the [facility] messaging system. 2. Superimposed anterior circulation calcified plaque without other hemodynamically significant stenosis. 3. Moderate to severe left and mild-to-moderate right vertebral artery origin stenosis due to calcified plaque. Otherwise negative posterior circulation. 4. Increased chronic bilateral pleural effusions since May, loculated on the left. Associated increased bilateral compressive pulmonary atelectasis. 5.  Aortic Atherosclerosis (ICD10-I70.0). Electronically Signed   By: Genevie Ann M.D.   On: 01/04/2018 14:36   Ct Head Code Stroke Wo Contrast  Result Date: 01/04/2018 CLINICAL DATA:  Code stroke. 84 year old male with right side deficits. EXAM: CT HEAD WITHOUT CONTRAST  TECHNIQUE: Contiguous axial images were obtained from the base of the skull through the vertex without intravenous contrast. COMPARISON:  Brain MRI 03/24/2017.  Head CT 01/22/2016. FINDINGS: Brain: Stable cerebral volume since 2017. Chronic lacunar infarct right thalamus. Chronic but increased Patchy and confluent bilateral cerebral white matter hypodensity. Chronic basal ganglia dystrophic calcifications. No midline shift, ventriculomegaly, mass effect, evidence of mass lesion, intracranial hemorrhage or evidence of cortically based acute infarction. Vascular: Calcified atherosclerosis at the skull base. No suspicious intracranial vascular hyperdensity. Skull: No acute osseous abnormality identified. Sinuses/Orbits: Stable to improved paranasal sinus aeration since 2017. Mastoids and tympanic cavities remain clear. Other: Visualized orbit soft tissues are within normal limits. No acute scalp soft tissue findings. ASPECTS [facility] Stroke Program Early CT Score) 7 - Ganglionic level infarction (caudate, lentiform nuclei, internal capsule, insula, M1-M3 cortex): 10 - Supraganglionic infarction (M4-M6 cortex): 3 Total score (0-10 with 10 being normal): IMPRESSION: 1. No acute cortically based infarct or acute intracranial hemorrhage identified. ASPECTS is 10. 2. Chronic small vessel disease appears progressed by CT since 2017. 3. These results were communicated to Dr. Rory Percy at 2:04 pmon 8/18/2019by text page via the [facility] messaging system. Electronically Signed   By: Genevie Ann M.D.   On: 01/04/2018 14:04     All images have been reviewed by me personally.  EKG: Independently reviewed.   Assessment/Plan Active Problems:   Essential hypertension   GERD (gastroesophageal reflux disease)   Primary adenocarcinoma of upper lobe of left lung (HCC)   Physical deconditioning   Stroke (cerebrum) (HCC)    Acute Large Left MCA Stroke  - Given the acuity of this family is decided to make patient comfort care and  transition to hospice.  ER provider contacted agrees for palliative and hospice care who will see the patient tomorrow and likely help transitioning to hospice.  In the meantime we will admit the patient to the hospital for comfort care.  Comfort care measures -Comfort order set ordered.  Provide supportive care.  History of primary lung adenocarcinoma - No current treatment.  Physical deconditioning -Supportive care.    DVT prophylaxis: None Code Status: DO NOT RESUSCITATE Family Communication: Family at bedside including daughter and wife Disposition Plan: Transition to hospice likely tomorrow Consults called: [facility] palliative care and hospice called by  the ER provider Admission status: Obs   Time Spent: 65 minutes.  >50% of the time was devoted to discussing the patients care, assessment, plan and disposition with other care givers along with counseling the patient about the risks and benefits of treatment.   Please Note: This patient record was dictated using Editor, commissioning. Chart creation errors have been sought, but may not always have been located. Such creation errors do not reflect on the Standard of Medical Care.   Cuyler Vandyken Arsenio Loader MD [facility] Pager [phone]  If 7PM-7AM, please contact night-coverage [email or web address] Password [facility]  01/04/2018, 3:36 PM

## 2018-01-04 NOTE — ED Triage Notes (Signed)
Patient arrived right after call out for unresponsive. Patient able to follow commands, but is non-verbal.LSN 11:00

## 2018-01-05 ENCOUNTER — Other Ambulatory Visit: Payer: Self-pay

## 2018-01-05 ENCOUNTER — Encounter (HOSPITAL_COMMUNITY): Payer: Self-pay | Admitting: General Practice

## 2018-01-05 DIAGNOSIS — Z86010 Personal history of colonic polyps: Secondary | ICD-10-CM | POA: Diagnosis not present

## 2018-01-05 DIAGNOSIS — Z515 Encounter for palliative care: Secondary | ICD-10-CM

## 2018-01-05 DIAGNOSIS — Z951 Presence of aortocoronary bypass graft: Secondary | ICD-10-CM | POA: Diagnosis not present

## 2018-01-05 DIAGNOSIS — I679 Cerebrovascular disease, unspecified: Secondary | ICD-10-CM | POA: Diagnosis not present

## 2018-01-05 DIAGNOSIS — R52 Pain, unspecified: Secondary | ICD-10-CM | POA: Diagnosis not present

## 2018-01-05 DIAGNOSIS — Z9981 Dependence on supplemental oxygen: Secondary | ICD-10-CM | POA: Diagnosis not present

## 2018-01-05 DIAGNOSIS — R279 Unspecified lack of coordination: Secondary | ICD-10-CM | POA: Diagnosis not present

## 2018-01-05 DIAGNOSIS — I639 Cerebral infarction, unspecified: Secondary | ICD-10-CM | POA: Diagnosis present

## 2018-01-05 DIAGNOSIS — I63312 Cerebral infarction due to thrombosis of left middle cerebral artery: Secondary | ICD-10-CM | POA: Diagnosis not present

## 2018-01-05 DIAGNOSIS — E785 Hyperlipidemia, unspecified: Secondary | ICD-10-CM | POA: Diagnosis present

## 2018-01-05 DIAGNOSIS — K219 Gastro-esophageal reflux disease without esophagitis: Secondary | ICD-10-CM | POA: Diagnosis present

## 2018-01-05 DIAGNOSIS — I63512 Cerebral infarction due to unspecified occlusion or stenosis of left middle cerebral artery: Secondary | ICD-10-CM | POA: Diagnosis present

## 2018-01-05 DIAGNOSIS — F329 Major depressive disorder, single episode, unspecified: Secondary | ICD-10-CM | POA: Diagnosis present

## 2018-01-05 DIAGNOSIS — R4701 Aphasia: Secondary | ICD-10-CM | POA: Diagnosis present

## 2018-01-05 DIAGNOSIS — C3412 Malignant neoplasm of upper lobe, left bronchus or lung: Secondary | ICD-10-CM | POA: Diagnosis not present

## 2018-01-05 DIAGNOSIS — Z743 Need for continuous supervision: Secondary | ICD-10-CM | POA: Diagnosis not present

## 2018-01-05 DIAGNOSIS — R4182 Altered mental status, unspecified: Secondary | ICD-10-CM | POA: Diagnosis not present

## 2018-01-05 DIAGNOSIS — N183 Chronic kidney disease, stage 3 (moderate): Secondary | ICD-10-CM | POA: Diagnosis present

## 2018-01-05 DIAGNOSIS — I509 Heart failure, unspecified: Secondary | ICD-10-CM | POA: Diagnosis not present

## 2018-01-05 DIAGNOSIS — J90 Pleural effusion, not elsewhere classified: Secondary | ICD-10-CM | POA: Diagnosis present

## 2018-01-05 DIAGNOSIS — Z85118 Personal history of other malignant neoplasm of bronchus and lung: Secondary | ICD-10-CM | POA: Diagnosis not present

## 2018-01-05 DIAGNOSIS — R64 Cachexia: Secondary | ICD-10-CM | POA: Diagnosis present

## 2018-01-05 DIAGNOSIS — I130 Hypertensive heart and chronic kidney disease with heart failure and stage 1 through stage 4 chronic kidney disease, or unspecified chronic kidney disease: Secondary | ICD-10-CM | POA: Diagnosis present

## 2018-01-05 DIAGNOSIS — Z8619 Personal history of other infectious and parasitic diseases: Secondary | ICD-10-CM | POA: Diagnosis not present

## 2018-01-05 DIAGNOSIS — I5032 Chronic diastolic (congestive) heart failure: Secondary | ICD-10-CM | POA: Diagnosis present

## 2018-01-05 DIAGNOSIS — I2510 Atherosclerotic heart disease of native coronary artery without angina pectoris: Secondary | ICD-10-CM | POA: Diagnosis present

## 2018-01-05 DIAGNOSIS — C3490 Malignant neoplasm of unspecified part of unspecified bronchus or lung: Secondary | ICD-10-CM | POA: Diagnosis not present

## 2018-01-05 DIAGNOSIS — I10 Essential (primary) hypertension: Secondary | ICD-10-CM | POA: Diagnosis not present

## 2018-01-05 DIAGNOSIS — F339 Major depressive disorder, recurrent, unspecified: Secondary | ICD-10-CM | POA: Diagnosis not present

## 2018-01-05 DIAGNOSIS — R4189 Other symptoms and signs involving cognitive functions and awareness: Secondary | ICD-10-CM | POA: Diagnosis present

## 2018-01-05 DIAGNOSIS — R404 Transient alteration of awareness: Secondary | ICD-10-CM | POA: Diagnosis not present

## 2018-01-05 DIAGNOSIS — Z9049 Acquired absence of other specified parts of digestive tract: Secondary | ICD-10-CM | POA: Diagnosis not present

## 2018-01-05 DIAGNOSIS — Z6824 Body mass index (BMI) 24.0-24.9, adult: Secondary | ICD-10-CM | POA: Diagnosis not present

## 2018-01-05 DIAGNOSIS — R29717 NIHSS score 17: Secondary | ICD-10-CM | POA: Diagnosis present

## 2018-01-05 DIAGNOSIS — G8191 Hemiplegia, unspecified affecting right dominant side: Secondary | ICD-10-CM | POA: Diagnosis present

## 2018-01-05 DIAGNOSIS — I252 Old myocardial infarction: Secondary | ICD-10-CM | POA: Diagnosis not present

## 2018-01-05 DIAGNOSIS — Z66 Do not resuscitate: Secondary | ICD-10-CM | POA: Diagnosis present

## 2018-01-05 MED ORDER — ORAL CARE MOUTH RINSE
15.0000 mL | Freq: Two times a day (BID) | OROMUCOSAL | Status: DC
  Administered 2018-01-05 (×2): 15 mL via OROMUCOSAL

## 2018-01-05 MED ORDER — MORPHINE SULFATE (PF) 2 MG/ML IV SOLN
1.0000 mg | INTRAVENOUS | Status: DC | PRN
  Administered 2018-01-05 – 2018-01-06 (×7): 1 mg via INTRAVENOUS
  Filled 2018-01-05 (×7): qty 1

## 2018-01-05 NOTE — Social Work (Signed)
CSW spoke with pt family (pt wife, pt daughter, and pt granddaughter at bedside) they had several considerate questions regarding options for [facility] or whether or not pt would be able to have care at [facility]F. Pt family would prefer to not have pt at other residential hospice home.   CSW followed up with [facility] coordinator Yates Decamp, Joellen [place] states that pt can return back to [facility] or if there is no SNF bed at [facility], [place] would be able to accept pt.   This was relayed to pt granddaughter at bedside around 1:35pm. Pt family would be amenable to this plan, however would like to see [facility] availability tomorrow morning.  Continuing to follow.   Alexander Mt, [place] Work [phone]

## 2018-01-05 NOTE — Progress Notes (Addendum)
STROKE TEAM PROGRESS NOTE   INTERVAL HISTORY His wife, dtr, and granddtr are at the bedside.  They all agree with plans for comfort care. They are working with SW for the next step.  Vitals:   01/04/18 1600 01/04/18 2016 01/05/18 0402 01/05/18 0830  BP: 139/76 (!) 147/75 115/66   Pulse: 75 80 82   Resp: (!) 22     Temp: (!) 97.3 F (36.3 C) (!) 97.4 F (36.3 C) 98.2 F (36.8 C)   TempSrc:  Oral Oral   SpO2: 99% 98% 99% 91%  Weight: 68 kg     Height: 5\' 6"  (1.676 m)       CBC:  Recent Labs  Lab 01/04/18 1345 01/04/18 1350  WBC 9.2  --   NEUTROABS 7.9*  --   HGB 11.1* 11.9*  HCT 37.1* 35.0*  MCV 90.9  --   PLT 160  --     Basic Metabolic Panel:  Recent Labs  Lab 01/04/18 1345 01/04/18 1350  NA 141 140  K 4.8 4.7  CL 107 107  CO2 24  --   GLUCOSE 121* 118*  BUN 37* 40*  CREATININE 1.93* 1.90*  CALCIUM 9.4  --    IMAGING Ct Angio Head W Or Wo Contrast  Result Date: 01/04/2018 CLINICAL DATA:  84 year old male code stroke. Right side weakness. History of lung cancer. EXAM: CT ANGIOGRAPHY HEAD AND NECK TECHNIQUE: Multidetector CT imaging of the head and neck was performed using the standard protocol during bolus administration of intravenous contrast. Multiplanar CT image reconstructions and MIPs were obtained to evaluate the vascular anatomy. Carotid stenosis measurements (when applicable) are obtained utilizing NASCET criteria, using the distal internal carotid diameter as the denominator. CONTRAST:  46mL ISOVUE-370 IOPAMIDOL (ISOVUE-370) INJECTION 76% COMPARISON:  Head CT without contrast 1354 hours today. Brain MRI 03/24/2017. Chest CT 10/14/2017. FINDINGS: CTA NECK Skeleton: Prior sternotomy. Osteopenia. No acute osseous abnormality identified. Upper chest: Increased chronic loculated left pleural effusion, increased fluid volume tracking into the left apex and along the left major fissure now. Associated increased compressive left lung atelectasis. Associated  increased mass effect on left lower lobe airways. The other central airways remain patent. Increased layering right pleural effusion, now up to moderate. Associated increased right lower lobe compressive atelectasis. Stable visible cardiac size.  Prior CABG. Other neck: Negative.  No neck mass no lymphadenopathy. Aortic arch: Calcified aortic atherosclerosis. Three vessel arch configuration. Right carotid system: No brachiocephalic artery or right CCA origin stenosis despite plaque. Tortuous right CCA origin with a mildly kinked appearance. Bulky calcified atherosclerosis at the right carotid bifurcation, but no significant proximal right ICA stenosis. Left carotid system: No left CCA origin stenosis despite plaque. Calcified plaque at the left ICA origin and bulb without hemodynamically significant stenosis. Vertebral arteries: No proximal right subclavian artery stenosis despite plaque. Calcified plaque at the right vertebral artery origin with mild stenosis. Tortuous right vertebral artery is patent to the skull base without additional stenosis. No proximal left subclavian artery stenosis despite plaque. Calcified plaque at the left vertebral artery origin with moderate to severe stenosis (series 8, image 107). The vertebral arteries are fairly codominant. The left remains patent to the skull base without additional stenosis. CTA HEAD Posterior circulation: Patent distal vertebral arteries without stenosis, the right is mildly dominant. Patent PICA origins and vertebrobasilar junction. Patent basilar artery without stenosis. Patent SCA and left PCA origins. Fetal type right PCA origin. Left Posterior communicating artery is diminutive or absent. Bilateral PCA branches  are within normal limits. Anterior circulation: The left ICA siphon is patent with mild proximal and moderate supraclinoid left ICA calcified plaque. Mild supraclinoid left ICA stenosis. Patent left carotid terminus. Patent left MCA origin and M1  segment but clot visible at the left MCA bifurcation (series 11, image 23 and series 10, image 24). Subsequent paucity of left MCA M2 branches, particularly the middle sylvian branches. There is some reconstitution of the most anterior and posterior branches. Right ICA siphon is patent with mild to moderate calcified plaque and mild stenosis. Right ICA terminus, right MCA and both ACA origins are patent and within normal limits. Anterior communicating artery and bilateral ACA branches are within normal limits, median artery of the corpus callosum (normal variant). Right MCA M1 segment, right MCA bifurcation and right MCA branches are within normal limits. Venous sinuses: Early contrast timing but there does appear to be enhancement of the major dural venous sinuses. Anatomic variants: Mildly dominant distal right vertebral artery. Fetal type right PCA origin. Median artery of the corpus callosum. Review of the MIP images confirms the above findings IMPRESSION: 1. Positive for emergent large vessel occlusion at the left MCA bifurcation. This was communicated to Dr. Rory Percy at 1424 hours by text page via the [facility] messaging system. 2. Superimposed anterior circulation calcified plaque without other hemodynamically significant stenosis. 3. Moderate to severe left and mild-to-moderate right vertebral artery origin stenosis due to calcified plaque. Otherwise negative posterior circulation. 4. Increased chronic bilateral pleural effusions since May, loculated on the left. Associated increased bilateral compressive pulmonary atelectasis. 5.  Aortic Atherosclerosis (ICD10-I70.0). Electronically Signed   By: Genevie Ann M.D.   On: 01/04/2018 14:36   Ct Angio Neck W Or Wo Contrast  Result Date: 01/04/2018 CLINICAL DATA:  84 year old male code stroke. Right side weakness. History of lung cancer. EXAM: CT ANGIOGRAPHY HEAD AND NECK TECHNIQUE: Multidetector CT imaging of the head and neck was performed using the standard protocol  during bolus administration of intravenous contrast. Multiplanar CT image reconstructions and MIPs were obtained to evaluate the vascular anatomy. Carotid stenosis measurements (when applicable) are obtained utilizing NASCET criteria, using the distal internal carotid diameter as the denominator. CONTRAST:  44mL ISOVUE-370 IOPAMIDOL (ISOVUE-370) INJECTION 76% COMPARISON:  Head CT without contrast 1354 hours today. Brain MRI 03/24/2017. Chest CT 10/14/2017. FINDINGS: CTA NECK Skeleton: Prior sternotomy. Osteopenia. No acute osseous abnormality identified. Upper chest: Increased chronic loculated left pleural effusion, increased fluid volume tracking into the left apex and along the left major fissure now. Associated increased compressive left lung atelectasis. Associated increased mass effect on left lower lobe airways. The other central airways remain patent. Increased layering right pleural effusion, now up to moderate. Associated increased right lower lobe compressive atelectasis. Stable visible cardiac size.  Prior CABG. Other neck: Negative.  No neck mass no lymphadenopathy. Aortic arch: Calcified aortic atherosclerosis. Three vessel arch configuration. Right carotid system: No brachiocephalic artery or right CCA origin stenosis despite plaque. Tortuous right CCA origin with a mildly kinked appearance. Bulky calcified atherosclerosis at the right carotid bifurcation, but no significant proximal right ICA stenosis. Left carotid system: No left CCA origin stenosis despite plaque. Calcified plaque at the left ICA origin and bulb without hemodynamically significant stenosis. Vertebral arteries: No proximal right subclavian artery stenosis despite plaque. Calcified plaque at the right vertebral artery origin with mild stenosis. Tortuous right vertebral artery is patent to the skull base without additional stenosis. No proximal left subclavian artery stenosis despite plaque. Calcified plaque at the left  vertebral  artery origin with moderate to severe stenosis (series 8, image 107). The vertebral arteries are fairly codominant. The left remains patent to the skull base without additional stenosis. CTA HEAD Posterior circulation: Patent distal vertebral arteries without stenosis, the right is mildly dominant. Patent PICA origins and vertebrobasilar junction. Patent basilar artery without stenosis. Patent SCA and left PCA origins. Fetal type right PCA origin. Left Posterior communicating artery is diminutive or absent. Bilateral PCA branches are within normal limits. Anterior circulation: The left ICA siphon is patent with mild proximal and moderate supraclinoid left ICA calcified plaque. Mild supraclinoid left ICA stenosis. Patent left carotid terminus. Patent left MCA origin and M1 segment but clot visible at the left MCA bifurcation (series 11, image 23 and series 10, image 24). Subsequent paucity of left MCA M2 branches, particularly the middle sylvian branches. There is some reconstitution of the most anterior and posterior branches. Right ICA siphon is patent with mild to moderate calcified plaque and mild stenosis. Right ICA terminus, right MCA and both ACA origins are patent and within normal limits. Anterior communicating artery and bilateral ACA branches are within normal limits, median artery of the corpus callosum (normal variant). Right MCA M1 segment, right MCA bifurcation and right MCA branches are within normal limits. Venous sinuses: Early contrast timing but there does appear to be enhancement of the major dural venous sinuses. Anatomic variants: Mildly dominant distal right vertebral artery. Fetal type right PCA origin. Median artery of the corpus callosum. Review of the MIP images confirms the above findings IMPRESSION: 1. Positive for emergent large vessel occlusion at the left MCA bifurcation. This was communicated to Dr. Rory Percy at 1424 hours by text page via the [facility] messaging system. 2. Superimposed  anterior circulation calcified plaque without other hemodynamically significant stenosis. 3. Moderate to severe left and mild-to-moderate right vertebral artery origin stenosis due to calcified plaque. Otherwise negative posterior circulation. 4. Increased chronic bilateral pleural effusions since May, loculated on the left. Associated increased bilateral compressive pulmonary atelectasis. 5.  Aortic Atherosclerosis (ICD10-I70.0). Electronically Signed   By: Genevie Ann M.D.   On: 01/04/2018 14:36   Ct Head Code Stroke Wo Contrast  Result Date: 01/04/2018 CLINICAL DATA:  Code stroke. 84 year old male with right side deficits. EXAM: CT HEAD WITHOUT CONTRAST TECHNIQUE: Contiguous axial images were obtained from the base of the skull through the vertex without intravenous contrast. COMPARISON:  Brain MRI 03/24/2017.  Head CT 01/22/2016. FINDINGS: Brain: Stable cerebral volume since 2017. Chronic lacunar infarct right thalamus. Chronic but increased Patchy and confluent bilateral cerebral white matter hypodensity. Chronic basal ganglia dystrophic calcifications. No midline shift, ventriculomegaly, mass effect, evidence of mass lesion, intracranial hemorrhage or evidence of cortically based acute infarction. Vascular: Calcified atherosclerosis at the skull base. No suspicious intracranial vascular hyperdensity. Skull: No acute osseous abnormality identified. Sinuses/Orbits: Stable to improved paranasal sinus aeration since 2017. Mastoids and tympanic cavities remain clear. Other: Visualized orbit soft tissues are within normal limits. No acute scalp soft tissue findings. ASPECTS [facility] Stroke Program Early CT Score) 7 - Ganglionic level infarction (caudate, lentiform nuclei, internal capsule, insula, M1-M3 cortex): 10 - Supraganglionic infarction (M4-M6 cortex): 3 Total score (0-10 with 10 being normal): IMPRESSION: 1. No acute cortically based infarct or acute intracranial hemorrhage identified. ASPECTS is 10. 2.  Chronic small vessel disease appears progressed by CT since 2017. 3. These results were communicated to Dr. Rory Percy at 2:04 pmon 8/18/2019by text page via the [facility] messaging system. Electronically Signed   By: Lemmie Evens  [place] Crane M.D.   On: 01/04/2018 14:04    PHYSICAL EXAM Frail elderly Caucasian male resting comfortably. . Afebrile. Head is nontraumatic. Neck is supple without bruit.    Cardiac exam no murmur or gallop. Lungs are clear to auscultation. Distal pulses are well felt. Neurological Exam : Patient is stuporous and unresponsive.  Eyes are closed.  Left gaze preference.  Globally aphasic and will not follow any commands.  Withdraws left side to painful stimuli.  Flaccid right hemiplegia. ASSESSMENT/PLAN Scott [name] is a 84 y.o. male with history of static lung cancer, HLD, CKD 3, hypertensive heart disease presenting with unresponsiveness, inability to speak.  Given metastatic lung cancer and recent referral to hospice, family do not want any aggressive testing or work-up.  Stroke: Large left MCA infarct, embolic   Code Stroke CT head No acute stroke. Small vessel disease. ASPECTS 10.     CTA head & neck positive PLV old left MCA bifurcation.  Superimposed anterior circulation calcified plaque.  Moderate to severe left and right vertebral artery origin stenosis.  Increased chronic bilateral pleural effusions.  Aortic atherosclerosis.  clopidogrel 75 mg daily prior to admission, now on No antithrombotic  Family agreeable to comfort care.    Disposition:  pending   Hypertension  Hyperlipidemia  Home meds:  lipitor 40  Other Stroke Risk Factors  Advanced age  ETOH use  Other Active Problems  GERD  Physical deconditioning   Ok for d/c from stroke standpoint  Follow-up Stroke Clinic at [facility] in 4 weeks. Office will call with appointment date and time. Order placed.  NOTHING FURTHER TO ADD FROM THE STROKE STANDPOINT  Stroke Service will sign  off. Please call should any needs arise.  Hospital day # 0   Burnetta Sabin, MSN, APRN, ANVP-BC, AGPCNP-BC Advanced Practice Stroke Nurse [place] for Schedule & Pager information 01/05/2018 3:54 PM  I have personally examined this patient, reviewed notes, independently viewed imaging studies, participated in medical decision making and plan of care.ROS completed by me personally and pertinent positives fully documented  I have made any additions or clarifications directly to the above note. Agree with note above.  He has presented with left middle cerebral artery occlusion and large left MCA infarct and has extremely poor prognosis.  Family is quite clear that patient would prefer comfort care measures only and no aggressive testing or work-up.  Agree with plan for comfort care.  Long discussion with family at the bedside and answered questions.  Discussed with Dr. Lucianne Lei.  Greater than 50% time during this 25-minute visit was spent on counseling and coordination of care about his stroke and discussion about prognosis and plan of care.  Stroke team will sign off.  Kindly call for questions.  Scott Contras, MD Medical Director [facility] Pager: [phone] 01/05/2018 8:05 PM  To contact Stroke Continuity provider, please refer to [email or web address] After hours, contact General Neurology

## 2018-01-05 NOTE — Progress Notes (Signed)
Progress Note    Scott [name]  LGX:[id number] DOB: [birth year]  DOA: 01/04/2018 PCP: [name], MD    Brief Narrative:     Medical records reviewed and are as summarized below:  Scott [name] is an 84 y.o. male found to have a large CVA and patient was transitioned to comfort care.  Awaiting residential hospice placement  Assessment/Plan:   Active Problems:   Essential hypertension   GERD (gastroesophageal reflux disease)   Primary adenocarcinoma of upper lobe of left lung (HCC)   Physical deconditioning   Stroke (cerebrum) (HCC)   Acute Large Left MCA Stroke  - family has decided to make patient comfort care and transition to hospice. -plan for residential hospice when bed available  Comfort care measures -Comfort- PRN ativan/morphine  History of primary lung adenocarcinoma - No current treatment.   Family Communication/Anticipated D/C date and plan/Code Status   DVT prophylaxis:  Code Status: dnr Family Communication: at bedside Disposition Plan: residential hospice?   Medical Consultants:    Neuro  Hospice    Subjective:   Per nursing had some air hunger this AM- improved with ativan  Objective:    Vitals:   01/04/18 1600 01/04/18 2016 01/05/18 0402 01/05/18 0830  BP: 139/76 (!) 147/75 115/66   Pulse: 75 80 82   Resp: (!) 22     Temp: (!) 97.3 F (36.3 C) (!) 97.4 F (36.3 C) 98.2 F (36.8 C)   TempSrc:  Oral Oral   SpO2: 99% 98% 99% 91%  Weight: 68 kg     Height: 5\' 6"  (1.676 m)       Intake/Output Summary (Last 24 hours) at 01/05/2018 1407 Last data filed at 01/05/2018 0600 Gross per 24 hour  Intake 0 ml  Output 275 ml  Net -275 ml   Filed Weights   01/04/18 1415 01/04/18 1600  Weight: 68 kg 68 kg    Exam: In bed, not responding to verbal stimuli Appears to be breathing comfortably  Data Reviewed:   I have personally reviewed following labs and imaging studies:  Labs: Labs show the following:    Basic Metabolic Panel: Recent Labs  Lab 01/04/18 1345 01/04/18 1350  NA 141 140  K 4.8 4.7  CL 107 107  CO2 24  --   GLUCOSE 121* 118*  BUN 37* 40*  CREATININE 1.93* 1.90*  CALCIUM 9.4  --    GFR Estimated Creatinine Clearance: 22.4 mL/min (A) (by C-G formula based on SCr of 1.9 mg/dL (H)). Liver Function Tests: Recent Labs  Lab 01/04/18 1345  AST 35  ALT 35  ALKPHOS 85  BILITOT 0.8  PROT 6.7  ALBUMIN 3.2*   No results for input(s): LIPASE, AMYLASE in the last 168 hours. No results for input(s): AMMONIA in the last 168 hours. Coagulation profile Recent Labs  Lab 01/04/18 1345  INR 1.10    CBC: Recent Labs  Lab 01/04/18 1345 01/04/18 1350  WBC 9.2  --   NEUTROABS 7.9*  --   HGB 11.1* 11.9*  HCT 37.1* 35.0*  MCV 90.9  --   PLT 160  --    Cardiac Enzymes: No results for input(s): CKTOTAL, CKMB, CKMBINDEX, TROPONINI in the last 168 hours. BNP (last 3 results) No results for input(s): PROBNP in the last 8760 hours. CBG: No results for input(s): GLUCAP in the last 168 hours. D-Dimer: No results for input(s): DDIMER in the last 72 hours. Hgb A1c: No results for input(s): HGBA1C in the  last 72 hours. Lipid Profile: No results for input(s): CHOL, HDL, LDLCALC, TRIG, CHOLHDL, LDLDIRECT in the last 72 hours. Thyroid function studies: No results for input(s): TSH, T4TOTAL, T3FREE, THYROIDAB in the last 72 hours.  Invalid input(s): FREET3 Anemia work up: No results for input(s): VITAMINB12, FOLATE, FERRITIN, TIBC, IRON, RETICCTPCT in the last 72 hours. Sepsis Labs: Recent Labs  Lab 01/04/18 1345  WBC 9.2    Microbiology No results found for this or any previous visit (from the past 240 hour(s)).  Procedures and diagnostic studies:  Ct Angio Head W Or Wo Contrast  Result Date: 01/04/2018 CLINICAL DATA:  84 year old male code stroke. Right side weakness. History of lung cancer. EXAM: CT ANGIOGRAPHY HEAD AND NECK TECHNIQUE: Multidetector CT imaging  of the head and neck was performed using the standard protocol during bolus administration of intravenous contrast. Multiplanar CT image reconstructions and MIPs were obtained to evaluate the vascular anatomy. Carotid stenosis measurements (when applicable) are obtained utilizing NASCET criteria, using the distal internal carotid diameter as the denominator. CONTRAST:  55mL ISOVUE-370 IOPAMIDOL (ISOVUE-370) INJECTION 76% COMPARISON:  Head CT without contrast 1354 hours today. Brain MRI 03/24/2017. Chest CT 10/14/2017. FINDINGS: CTA NECK Skeleton: Prior sternotomy. Osteopenia. No acute osseous abnormality identified. Upper chest: Increased chronic loculated left pleural effusion, increased fluid volume tracking into the left apex and along the left major fissure now. Associated increased compressive left lung atelectasis. Associated increased mass effect on left lower lobe airways. The other central airways remain patent. Increased layering right pleural effusion, now up to moderate. Associated increased right lower lobe compressive atelectasis. Stable visible cardiac size.  Prior CABG. Other neck: Negative.  No neck mass no lymphadenopathy. Aortic arch: Calcified aortic atherosclerosis. Three vessel arch configuration. Right carotid system: No brachiocephalic artery or right CCA origin stenosis despite plaque. Tortuous right CCA origin with a mildly kinked appearance. Bulky calcified atherosclerosis at the right carotid bifurcation, but no significant proximal right ICA stenosis. Left carotid system: No left CCA origin stenosis despite plaque. Calcified plaque at the left ICA origin and bulb without hemodynamically significant stenosis. Vertebral arteries: No proximal right subclavian artery stenosis despite plaque. Calcified plaque at the right vertebral artery origin with mild stenosis. Tortuous right vertebral artery is patent to the skull base without additional stenosis. No proximal left subclavian artery  stenosis despite plaque. Calcified plaque at the left vertebral artery origin with moderate to severe stenosis (series 8, image 107). The vertebral arteries are fairly codominant. The left remains patent to the skull base without additional stenosis. CTA HEAD Posterior circulation: Patent distal vertebral arteries without stenosis, the right is mildly dominant. Patent PICA origins and vertebrobasilar junction. Patent basilar artery without stenosis. Patent SCA and left PCA origins. Fetal type right PCA origin. Left Posterior communicating artery is diminutive or absent. Bilateral PCA branches are within normal limits. Anterior circulation: The left ICA siphon is patent with mild proximal and moderate supraclinoid left ICA calcified plaque. Mild supraclinoid left ICA stenosis. Patent left carotid terminus. Patent left MCA origin and M1 segment but clot visible at the left MCA bifurcation (series 11, image 23 and series 10, image 24). Subsequent paucity of left MCA M2 branches, particularly the middle sylvian branches. There is some reconstitution of the most anterior and posterior branches. Right ICA siphon is patent with mild to moderate calcified plaque and mild stenosis. Right ICA terminus, right MCA and both ACA origins are patent and within normal limits. Anterior communicating artery and bilateral ACA branches are within normal  limits, median artery of the corpus callosum (normal variant). Right MCA M1 segment, right MCA bifurcation and right MCA branches are within normal limits. Venous sinuses: Early contrast timing but there does appear to be enhancement of the major dural venous sinuses. Anatomic variants: Mildly dominant distal right vertebral artery. Fetal type right PCA origin. Median artery of the corpus callosum. Review of the MIP images confirms the above findings IMPRESSION: 1. Positive for emergent large vessel occlusion at the left MCA bifurcation. This was communicated to Dr. Rory Percy at 1424 hours  by text page via the [facility] messaging system. 2. Superimposed anterior circulation calcified plaque without other hemodynamically significant stenosis. 3. Moderate to severe left and mild-to-moderate right vertebral artery origin stenosis due to calcified plaque. Otherwise negative posterior circulation. 4. Increased chronic bilateral pleural effusions since May, loculated on the left. Associated increased bilateral compressive pulmonary atelectasis. 5.  Aortic Atherosclerosis (ICD10-I70.0). Electronically Signed   By: Scott [name] M.D.   On: 01/04/2018 14:36   Ct Angio Neck W Or Wo Contrast  Result Date: 01/04/2018 CLINICAL DATA:  84 year old male code stroke. Right side weakness. History of lung cancer. EXAM: CT ANGIOGRAPHY HEAD AND NECK TECHNIQUE: Multidetector CT imaging of the head and neck was performed using the standard protocol during bolus administration of intravenous contrast. Multiplanar CT image reconstructions and MIPs were obtained to evaluate the vascular anatomy. Carotid stenosis measurements (when applicable) are obtained utilizing NASCET criteria, using the distal internal carotid diameter as the denominator. CONTRAST:  36mL ISOVUE-370 IOPAMIDOL (ISOVUE-370) INJECTION 76% COMPARISON:  Head CT without contrast 1354 hours today. Brain MRI 03/24/2017. Chest CT 10/14/2017. FINDINGS: CTA NECK Skeleton: Prior sternotomy. Osteopenia. No acute osseous abnormality identified. Upper chest: Increased chronic loculated left pleural effusion, increased fluid volume tracking into the left apex and along the left major fissure now. Associated increased compressive left lung atelectasis. Associated increased mass effect on left lower lobe airways. The other central airways remain patent. Increased layering right pleural effusion, now up to moderate. Associated increased right lower lobe compressive atelectasis. Stable visible cardiac size.  Prior CABG. Other neck: Negative.  No neck mass no lymphadenopathy. Aortic  arch: Calcified aortic atherosclerosis. Three vessel arch configuration. Right carotid system: No brachiocephalic artery or right CCA origin stenosis despite plaque. Tortuous right CCA origin with a mildly kinked appearance. Bulky calcified atherosclerosis at the right carotid bifurcation, but no significant proximal right ICA stenosis. Left carotid system: No left CCA origin stenosis despite plaque. Calcified plaque at the left ICA origin and bulb without hemodynamically significant stenosis. Vertebral arteries: No proximal right subclavian artery stenosis despite plaque. Calcified plaque at the right vertebral artery origin with mild stenosis. Tortuous right vertebral artery is patent to the skull base without additional stenosis. No proximal left subclavian artery stenosis despite plaque. Calcified plaque at the left vertebral artery origin with moderate to severe stenosis (series 8, image 107). The vertebral arteries are fairly codominant. The left remains patent to the skull base without additional stenosis. CTA HEAD Posterior circulation: Patent distal vertebral arteries without stenosis, the right is mildly dominant. Patent PICA origins and vertebrobasilar junction. Patent basilar artery without stenosis. Patent SCA and left PCA origins. Fetal type right PCA origin. Left Posterior communicating artery is diminutive or absent. Bilateral PCA branches are within normal limits. Anterior circulation: The left ICA siphon is patent with mild proximal and moderate supraclinoid left ICA calcified plaque. Mild supraclinoid left ICA stenosis. Patent left carotid terminus. Patent left MCA origin and M1 segment but clot  visible at the left MCA bifurcation (series 11, image 23 and series 10, image 24). Subsequent paucity of left MCA M2 branches, particularly the middle sylvian branches. There is some reconstitution of the most anterior and posterior branches. Right ICA siphon is patent with mild to moderate calcified  plaque and mild stenosis. Right ICA terminus, right MCA and both ACA origins are patent and within normal limits. Anterior communicating artery and bilateral ACA branches are within normal limits, median artery of the corpus callosum (normal variant). Right MCA M1 segment, right MCA bifurcation and right MCA branches are within normal limits. Venous sinuses: Early contrast timing but there does appear to be enhancement of the major dural venous sinuses. Anatomic variants: Mildly dominant distal right vertebral artery. Fetal type right PCA origin. Median artery of the corpus callosum. Review of the MIP images confirms the above findings IMPRESSION: 1. Positive for emergent large vessel occlusion at the left MCA bifurcation. This was communicated to Dr. Rory Percy at 1424 hours by text page via the [facility] messaging system. 2. Superimposed anterior circulation calcified plaque without other hemodynamically significant stenosis. 3. Moderate to severe left and mild-to-moderate right vertebral artery origin stenosis due to calcified plaque. Otherwise negative posterior circulation. 4. Increased chronic bilateral pleural effusions since May, loculated on the left. Associated increased bilateral compressive pulmonary atelectasis. 5.  Aortic Atherosclerosis (ICD10-I70.0). Electronically Signed   By: Scott [name] M.D.   On: 01/04/2018 14:36   Ct Head Code Stroke Wo Contrast  Result Date: 01/04/2018 CLINICAL DATA:  Code stroke. 84 year old male with right side deficits. EXAM: CT HEAD WITHOUT CONTRAST TECHNIQUE: Contiguous axial images were obtained from the base of the skull through the vertex without intravenous contrast. COMPARISON:  Brain MRI 03/24/2017.  Head CT 01/22/2016. FINDINGS: Brain: Stable cerebral volume since 2017. Chronic lacunar infarct right thalamus. Chronic but increased Patchy and confluent bilateral cerebral white matter hypodensity. Chronic basal ganglia dystrophic calcifications. No midline shift,  ventriculomegaly, mass effect, evidence of mass lesion, intracranial hemorrhage or evidence of cortically based acute infarction. Vascular: Calcified atherosclerosis at the skull base. No suspicious intracranial vascular hyperdensity. Skull: No acute osseous abnormality identified. Sinuses/Orbits: Stable to improved paranasal sinus aeration since 2017. Mastoids and tympanic cavities remain clear. Other: Visualized orbit soft tissues are within normal limits. No acute scalp soft tissue findings. ASPECTS [facility] Stroke Program Early CT Score) 7 - Ganglionic level infarction (caudate, lentiform nuclei, internal capsule, insula, M1-M3 cortex): 10 - Supraganglionic infarction (M4-M6 cortex): 3 Total score (0-10 with 10 being normal): IMPRESSION: 1. No acute cortically based infarct or acute intracranial hemorrhage identified. ASPECTS is 10. 2. Chronic small vessel disease appears progressed by CT since 2017. 3. These results were communicated to Dr. Rory Percy at 2:04 pmon 8/18/2019by text page via the [facility] messaging system. Electronically Signed   By: Scott [name] M.D.   On: 01/04/2018 14:04    Medications:   . fluticasone furoate-vilanterol  1 puff Inhalation Daily  . mouth rinse  15 mL Mouth Rinse BID   Continuous Infusions:   LOS: 0 days   Geradine Girt  [facility]   *Please refer to [email or web address], password [email or web address] to get updated schedule on who will round on this patient, as hospitalists switch teams weekly. If 7PM-7AM, please contact night-coverage at [email or web address], password [email or web address] for any overnight needs.  01/05/2018, 2:07 PM

## 2018-01-05 NOTE — Social Work (Addendum)
CSW acknowledging consult for residential hospice, pt active with [facility], will reach out to [facility] for bed availability.   10:00am- No bed availability today at [facility].   Alexander Mt, [place] Work [phone]

## 2018-01-05 NOTE — Progress Notes (Signed)
[facility])  Received request for family interest in [facility] for EOL care. Chart reviewed and met with g-dtr at bedside to acknowledge referral, answer questions and offer support. Explained financial charges and physician coverage. Unfortunately [facility] does not have a room to offer at this time. G-dtr aware [facility] liaison will follow up tomorrow or sooner if room becomes available.    Thank you,  Erling Conte, LCSW [phone]

## 2018-01-06 MED ORDER — MORPHINE SULFATE (PF) 2 MG/ML IV SOLN: 1.0000 mg | INTRAVENOUS | 0 refills | Status: AC | PRN

## 2018-01-06 MED ORDER — POLYVINYL ALCOHOL 1.4 % OP SOLN
1.0000 [drp] | Freq: Four times a day (QID) | OPHTHALMIC | Status: DC | PRN
  Filled 2018-01-06: qty 15

## 2018-01-06 MED ORDER — GLYCOPYRROLATE 0.2 MG/ML IJ SOLN: 0.2000 mg | INTRAMUSCULAR | Status: AC | PRN

## 2018-01-06 MED ORDER — LORAZEPAM 2 MG/ML IJ SOLN: 1.0000 mg | INTRAMUSCULAR | 0 refills | Status: AC | PRN

## 2018-01-06 MED ORDER — ACETAMINOPHEN 650 MG RE SUPP: 650.0000 mg | Freq: Four times a day (QID) | RECTAL | 0 refills | Status: AC | PRN

## 2018-01-06 MED ORDER — GLYCOPYRROLATE 1 MG PO TABS: 1.0000 mg | ORAL_TABLET | ORAL | Status: DC | PRN

## 2018-01-06 MED ORDER — ONDANSETRON HCL 4 MG/2ML IJ SOLN: 4.0000 mg | Freq: Four times a day (QID) | INTRAMUSCULAR | 0 refills | Status: AC | PRN

## 2018-01-06 MED ORDER — ALBUTEROL SULFATE (2.5 MG/3ML) 0.083% IN NEBU: 2.5000 mg | INHALATION_SOLUTION | Freq: Four times a day (QID) | RESPIRATORY_TRACT | 12 refills | Status: AC | PRN

## 2018-01-06 MED ORDER — GLYCOPYRROLATE 0.2 MG/ML IJ SOLN
0.2000 mg | INTRAMUSCULAR | Status: DC | PRN
  Administered 2018-01-06: 0.2 mg via INTRAVENOUS
  Filled 2018-01-06: qty 1

## 2018-01-06 MED ORDER — GLYCOPYRROLATE 0.2 MG/ML IJ SOLN: 0.2000 mg | INTRAMUSCULAR | Status: DC | PRN

## 2018-01-06 NOTE — Social Work (Signed)
Clinical Social Worker facilitated patient discharge including contacting patient family and facility to confirm patient discharge plans.  Clinical information faxed to facility and family agreeable with plan.  CSW arranged ambulance transport via [facility] to [facility] at [place] to call [phone] with report  prior to discharge.  Clinical Social Worker will sign off for now as social work intervention is no longer needed. Please consult [place] again if new need arises.  Alexander Mt, [place] Social Worker [phone]

## 2018-01-06 NOTE — Social Work (Signed)
CSW confirmed with family that [facility] is plan at discharge as [facility] does not have beds. They will meet with [facility] liaison at 1:30pm to complete paperwork.  Alexander Mt, [place] Work [phone]

## 2018-01-06 NOTE — Progress Notes (Signed)
Transportation here to take pt to [facility]. Family at bedside.

## 2018-01-06 NOTE — Discharge Summary (Signed)
Physician Discharge Summary  Scott [name] FVC:[id number] DOB: 1923-08-02 DOA: 01/04/2018  PCP: [name], MD  Admit date: 01/04/2018 Discharge date: 01/06/2018  Admitted From: home Discharge disposition: residential hospice   Recommendations for Outpatient Follow-Up:   1. Comfort care   Discharge Diagnosis:   Active Problems:   Essential hypertension   GERD (gastroesophageal reflux disease)   Primary adenocarcinoma of upper lobe of left lung (HCC)   Physical deconditioning   Stroke (cerebrum) (HCC)   Stroke (HCC)     Diet recommendation: as tolerated  Wound care: None.  Code status: DNR   History of Present Illness:   Scott [name] is a 84 y.o. male with medical history significant of with past medical history of metastatic adenocarcinoma of his lung, essential hypertension, GERD, CKD stage III comes to the hospital after being found unresponsive.  Wife went to church this morning and patient was in his usual state but when she returned he found patient unresponsive and unable to speak.  He appeared extremely weak therefore called EMS.  EMS brought the patient to the hospital for further care and management.  In the ER patient was not moving his right upper and lower extremity.  CT of the head was negative for acute pathology but CTA of the head and neck showed positive large emergent occlusion of the left MCA.  After a prolonged discussion by the ER provider with the family including his current diagnosis and chronic comorbidities family decided to transition patient to comfort care.  ER provider got in touch with arrange for a palliative care team who stated they will come see the patient tomorrow as they do not have capability to admit the patient there today.  Medical team was requested to admit the patient overnight and transition him to comfort care tomorrow.  When I saw the patient he was laying in the bed and unresponsive but was moving his left  upper and lower extremity spontaneously.  No movement noted on the right side.  Vital signs were stable.   Hospital Course by Problem:   Acute Large Left MCA Stroke - family has decided to make patient comfort care and transition to hospice. -plan for residential hospice when bed available  Comfort care measures -Comfort- PRN ativan/morphine  History of primary lung adenocarcinoma - No current treatment.    Medical Consultants:    neuro  Discharge Exam:   Vitals:   01/06/18 0545 01/06/18 0842  BP: (!) 117/58   Pulse: 82   Resp: (!) 21   Temp: 98.1 F (36.7 C)   SpO2: 93% 94%   Vitals:   01/05/18 0830 01/05/18 1432 01/06/18 0545 01/06/18 0842  BP:   (!) 117/58   Pulse:   82   Resp:   (!) 21   Temp:   98.1 F (36.7 C)   TempSrc:   Axillary   SpO2: 91% 96% 93% 94%  Weight:      Height:        General exam: Appears calm and comfortable.    The results of significant diagnostics from this hospitalization (including imaging, microbiology, ancillary and laboratory) are listed below for reference.     Procedures and Diagnostic Studies:   Ct Angio Head W Or Wo Contrast  Result Date: 01/04/2018 CLINICAL DATA:  84 year old male code stroke. Right side weakness. History of lung cancer. EXAM: CT ANGIOGRAPHY HEAD AND NECK TECHNIQUE: Multidetector CT imaging of the head and neck was performed using the  standard protocol during bolus administration of intravenous contrast. Multiplanar CT image reconstructions and MIPs were obtained to evaluate the vascular anatomy. Carotid stenosis measurements (when applicable) are obtained utilizing NASCET criteria, using the distal internal carotid diameter as the denominator. CONTRAST:  53mL ISOVUE-370 IOPAMIDOL (ISOVUE-370) INJECTION 76% COMPARISON:  Head CT without contrast 1354 hours today. Brain MRI 03/24/2017. Chest CT 10/14/2017. FINDINGS: CTA NECK Skeleton: Prior sternotomy. Osteopenia. No acute osseous abnormality identified.  Upper chest: Increased chronic loculated left pleural effusion, increased fluid volume tracking into the left apex and along the left major fissure now. Associated increased compressive left lung atelectasis. Associated increased mass effect on left lower lobe airways. The other central airways remain patent. Increased layering right pleural effusion, now up to moderate. Associated increased right lower lobe compressive atelectasis. Stable visible cardiac size.  Prior CABG. Other neck: Negative.  No neck mass no lymphadenopathy. Aortic arch: Calcified aortic atherosclerosis. Three vessel arch configuration. Right carotid system: No brachiocephalic artery or right CCA origin stenosis despite plaque. Tortuous right CCA origin with a mildly kinked appearance. Bulky calcified atherosclerosis at the right carotid bifurcation, but no significant proximal right ICA stenosis. Left carotid system: No left CCA origin stenosis despite plaque. Calcified plaque at the left ICA origin and bulb without hemodynamically significant stenosis. Vertebral arteries: No proximal right subclavian artery stenosis despite plaque. Calcified plaque at the right vertebral artery origin with mild stenosis. Tortuous right vertebral artery is patent to the skull base without additional stenosis. No proximal left subclavian artery stenosis despite plaque. Calcified plaque at the left vertebral artery origin with moderate to severe stenosis (series 8, image 107). The vertebral arteries are fairly codominant. The left remains patent to the skull base without additional stenosis. CTA HEAD Posterior circulation: Patent distal vertebral arteries without stenosis, the right is mildly dominant. Patent PICA origins and vertebrobasilar junction. Patent basilar artery without stenosis. Patent SCA and left PCA origins. Fetal type right PCA origin. Left Posterior communicating artery is diminutive or absent. Bilateral PCA branches are within normal limits.  Anterior circulation: The left ICA siphon is patent with mild proximal and moderate supraclinoid left ICA calcified plaque. Mild supraclinoid left ICA stenosis. Patent left carotid terminus. Patent left MCA origin and M1 segment but clot visible at the left MCA bifurcation (series 11, image 23 and series 10, image 24). Subsequent paucity of left MCA M2 branches, particularly the middle sylvian branches. There is some reconstitution of the most anterior and posterior branches. Right ICA siphon is patent with mild to moderate calcified plaque and mild stenosis. Right ICA terminus, right MCA and both ACA origins are patent and within normal limits. Anterior communicating artery and bilateral ACA branches are within normal limits, median artery of the corpus callosum (normal variant). Right MCA M1 segment, right MCA bifurcation and right MCA branches are within normal limits. Venous sinuses: Early contrast timing but there does appear to be enhancement of the major dural venous sinuses. Anatomic variants: Mildly dominant distal right vertebral artery. Fetal type right PCA origin. Median artery of the corpus callosum. Review of the MIP images confirms the above findings IMPRESSION: 1. Positive for emergent large vessel occlusion at the left MCA bifurcation. This was communicated to Dr. Rory Percy at 1424 hours by text page via the [facility] messaging system. 2. Superimposed anterior circulation calcified plaque without other hemodynamically significant stenosis. 3. Moderate to severe left and mild-to-moderate right vertebral artery origin stenosis due to calcified plaque. Otherwise negative posterior circulation. 4. Increased chronic bilateral pleural effusions  since May, loculated on the left. Associated increased bilateral compressive pulmonary atelectasis. 5.  Aortic Atherosclerosis (ICD10-I70.0). Electronically Signed   By: Genevie Ann M.D.   On: 01/04/2018 14:36   Ct Angio Neck W Or Wo Contrast  Result Date:  01/04/2018 CLINICAL DATA:  84 year old male code stroke. Right side weakness. History of lung cancer. EXAM: CT ANGIOGRAPHY HEAD AND NECK TECHNIQUE: Multidetector CT imaging of the head and neck was performed using the standard protocol during bolus administration of intravenous contrast. Multiplanar CT image reconstructions and MIPs were obtained to evaluate the vascular anatomy. Carotid stenosis measurements (when applicable) are obtained utilizing NASCET criteria, using the distal internal carotid diameter as the denominator. CONTRAST:  55mL ISOVUE-370 IOPAMIDOL (ISOVUE-370) INJECTION 76% COMPARISON:  Head CT without contrast 1354 hours today. Brain MRI 03/24/2017. Chest CT 10/14/2017. FINDINGS: CTA NECK Skeleton: Prior sternotomy. Osteopenia. No acute osseous abnormality identified. Upper chest: Increased chronic loculated left pleural effusion, increased fluid volume tracking into the left apex and along the left major fissure now. Associated increased compressive left lung atelectasis. Associated increased mass effect on left lower lobe airways. The other central airways remain patent. Increased layering right pleural effusion, now up to moderate. Associated increased right lower lobe compressive atelectasis. Stable visible cardiac size.  Prior CABG. Other neck: Negative.  No neck mass no lymphadenopathy. Aortic arch: Calcified aortic atherosclerosis. Three vessel arch configuration. Right carotid system: No brachiocephalic artery or right CCA origin stenosis despite plaque. Tortuous right CCA origin with a mildly kinked appearance. Bulky calcified atherosclerosis at the right carotid bifurcation, but no significant proximal right ICA stenosis. Left carotid system: No left CCA origin stenosis despite plaque. Calcified plaque at the left ICA origin and bulb without hemodynamically significant stenosis. Vertebral arteries: No proximal right subclavian artery stenosis despite plaque. Calcified plaque at the right  vertebral artery origin with mild stenosis. Tortuous right vertebral artery is patent to the skull base without additional stenosis. No proximal left subclavian artery stenosis despite plaque. Calcified plaque at the left vertebral artery origin with moderate to severe stenosis (series 8, image 107). The vertebral arteries are fairly codominant. The left remains patent to the skull base without additional stenosis. CTA HEAD Posterior circulation: Patent distal vertebral arteries without stenosis, the right is mildly dominant. Patent PICA origins and vertebrobasilar junction. Patent basilar artery without stenosis. Patent SCA and left PCA origins. Fetal type right PCA origin. Left Posterior communicating artery is diminutive or absent. Bilateral PCA branches are within normal limits. Anterior circulation: The left ICA siphon is patent with mild proximal and moderate supraclinoid left ICA calcified plaque. Mild supraclinoid left ICA stenosis. Patent left carotid terminus. Patent left MCA origin and M1 segment but clot visible at the left MCA bifurcation (series 11, image 23 and series 10, image 24). Subsequent paucity of left MCA M2 branches, particularly the middle sylvian branches. There is some reconstitution of the most anterior and posterior branches. Right ICA siphon is patent with mild to moderate calcified plaque and mild stenosis. Right ICA terminus, right MCA and both ACA origins are patent and within normal limits. Anterior communicating artery and bilateral ACA branches are within normal limits, median artery of the corpus callosum (normal variant). Right MCA M1 segment, right MCA bifurcation and right MCA branches are within normal limits. Venous sinuses: Early contrast timing but there does appear to be enhancement of the major dural venous sinuses. Anatomic variants: Mildly dominant distal right vertebral artery. Fetal type right PCA origin. Median artery of the corpus  callosum. Review of the MIP images  confirms the above findings IMPRESSION: 1. Positive for emergent large vessel occlusion at the left MCA bifurcation. This was communicated to Dr. Rory Percy at 1424 hours by text page via the [facility] messaging system. 2. Superimposed anterior circulation calcified plaque without other hemodynamically significant stenosis. 3. Moderate to severe left and mild-to-moderate right vertebral artery origin stenosis due to calcified plaque. Otherwise negative posterior circulation. 4. Increased chronic bilateral pleural effusions since May, loculated on the left. Associated increased bilateral compressive pulmonary atelectasis. 5.  Aortic Atherosclerosis (ICD10-I70.0). Electronically Signed   By: Genevie Ann M.D.   On: 01/04/2018 14:36   Ct Head Code Stroke Wo Contrast  Result Date: 01/04/2018 CLINICAL DATA:  Code stroke. 84 year old male with right side deficits. EXAM: CT HEAD WITHOUT CONTRAST TECHNIQUE: Contiguous axial images were obtained from the base of the skull through the vertex without intravenous contrast. COMPARISON:  Brain MRI 03/24/2017.  Head CT 01/22/2016. FINDINGS: Brain: Stable cerebral volume since 2017. Chronic lacunar infarct right thalamus. Chronic but increased Patchy and confluent bilateral cerebral white matter hypodensity. Chronic basal ganglia dystrophic calcifications. No midline shift, ventriculomegaly, mass effect, evidence of mass lesion, intracranial hemorrhage or evidence of cortically based acute infarction. Vascular: Calcified atherosclerosis at the skull base. No suspicious intracranial vascular hyperdensity. Skull: No acute osseous abnormality identified. Sinuses/Orbits: Stable to improved paranasal sinus aeration since 2017. Mastoids and tympanic cavities remain clear. Other: Visualized orbit soft tissues are within normal limits. No acute scalp soft tissue findings. ASPECTS [facility] Stroke Program Early CT Score) 7 - Ganglionic level infarction (caudate, lentiform nuclei, internal capsule,  insula, M1-M3 cortex): 10 - Supraganglionic infarction (M4-M6 cortex): 3 Total score (0-10 with 10 being normal): IMPRESSION: 1. No acute cortically based infarct or acute intracranial hemorrhage identified. ASPECTS is 10. 2. Chronic small vessel disease appears progressed by CT since 2017. 3. These results were communicated to Dr. Rory Percy at 2:04 pmon 8/18/2019by text page via the [facility] messaging system. Electronically Signed   By: Genevie Ann M.D.   On: 01/04/2018 14:04     Labs:   Basic Metabolic Panel: Recent Labs  Lab 01/04/18 1345 01/04/18 1350  NA 141 140  K 4.8 4.7  CL 107 107  CO2 24  --   GLUCOSE 121* 118*  BUN 37* 40*  CREATININE 1.93* 1.90*  CALCIUM 9.4  --    GFR Estimated Creatinine Clearance: 22.4 mL/min (A) (by C-G formula based on SCr of 1.9 mg/dL (H)). Liver Function Tests: Recent Labs  Lab 01/04/18 1345  AST 35  ALT 35  ALKPHOS 85  BILITOT 0.8  PROT 6.7  ALBUMIN 3.2*   No results for input(s): LIPASE, AMYLASE in the last 168 hours. No results for input(s): AMMONIA in the last 168 hours. Coagulation profile Recent Labs  Lab 01/04/18 1345  INR 1.10    CBC: Recent Labs  Lab 01/04/18 1345 01/04/18 1350  WBC 9.2  --   NEUTROABS 7.9*  --   HGB 11.1* 11.9*  HCT 37.1* 35.0*  MCV 90.9  --   PLT 160  --    Cardiac Enzymes: No results for input(s): CKTOTAL, CKMB, CKMBINDEX, TROPONINI in the last 168 hours. BNP: Invalid input(s): POCBNP CBG: No results for input(s): GLUCAP in the last 168 hours. D-Dimer No results for input(s): DDIMER in the last 72 hours. Hgb A1c No results for input(s): HGBA1C in the last 72 hours. Lipid Profile No results for input(s): CHOL, HDL, LDLCALC, TRIG, CHOLHDL, LDLDIRECT in the  last 72 hours. Thyroid function studies No results for input(s): TSH, T4TOTAL, T3FREE, THYROIDAB in the last 72 hours.  Invalid input(s): FREET3 Anemia work up No results for input(s): VITAMINB12, FOLATE, FERRITIN, TIBC, IRON, RETICCTPCT in  the last 72 hours. Microbiology No results found for this or any previous visit (from the past 240 hour(s)).   Discharge Instructions:    Allergies as of 01/06/2018   No Known Allergies     Medication List    STOP taking these medications   acetaminophen 500 MG tablet Commonly known as:  TYLENOL Replaced by:  acetaminophen 650 MG suppository   albuterol 108 (90 Base) MCG/ACT inhaler Commonly known as:  PROVENTIL HFA;VENTOLIN HFA Replaced by:  albuterol (2.5 MG/3ML) 0.083% nebulizer solution   atorvastatin 40 MG tablet Commonly known as:  LIPITOR   buPROPion 150 MG 12 hr tablet Commonly known as:  WELLBUTRIN SR   clopidogrel 75 MG tablet Commonly known as:  PLAVIX   Desvenlafaxine Succinate ER 25 MG Tb24   FERROCITE 324 (106 Fe) MG Tabs tablet Generic drug:  Ferrous Fumarate   Fish Oil 1000 MG Caps   isosorbide mononitrate 30 MG 24 hr tablet Commonly known as:  IMDUR   nitroGLYCERIN 0.4 MG SL tablet Commonly known as:  NITROSTAT   osimertinib mesylate 80 MG tablet Commonly known as:  TAGRISSO   pantoprazole 40 MG tablet Commonly known as:  PROTONIX   vitamin B-12 500 MCG tablet Commonly known as:  CYANOCOBALAMIN     TAKE these medications   acetaminophen 650 MG suppository Commonly known as:  TYLENOL Place 1 suppository (650 mg total) rectally every 6 (six) hours as needed for mild pain (or Fever >/= 101). Replaces:  acetaminophen 500 MG tablet   albuterol (2.5 MG/3ML) 0.083% nebulizer solution Commonly known as:  PROVENTIL Inhale 3 mLs (2.5 mg total) into the lungs every 6 (six) hours as needed for wheezing or shortness of breath. Replaces:  albuterol 108 (90 Base) MCG/ACT inhaler   BREO ELLIPTA 100-25 MCG/INH Aepb Generic drug:  fluticasone furoate-vilanterol Inhale 1 puff into the lungs daily.   glycopyrrolate 0.2 MG/ML injection Commonly known as:  ROBINUL Inject 1 mL (0.2 mg total) into the vein every 4 (four) hours as needed (excessive  secretions).   LORazepam 2 MG/ML injection Commonly known as:  ATIVAN Inject 0.5 mLs (1 mg total) into the vein every 4 (four) hours as needed for anxiety.   morphine 2 MG/ML injection Inject 0.5 mLs (1 mg total) into the vein every hour as needed (air hunger/for comfort).   ondansetron 4 MG/2ML Soln injection Commonly known as:  ZOFRAN Inject 2 mLs (4 mg total) into the vein every 6 (six) hours as needed for nausea.   OXYGEN Inhale 2 L into the lungs continuous.         Time coordinating discharge: 35 min  Signed:  Geradine Girt  [facility] 01/06/2018, 11:51 AM

## 2018-01-06 NOTE — Social Work (Signed)
CSW spoke with pt family at bedside, they are aware that there is a bed available at [facility]. Pt wife does express possibly wanting pt to come back to [facility]. Family will discuss and have decision prior to meeting with [place] at 1:30pm.   [place], [place] Work [phone]

## 2018-01-06 NOTE — Progress Notes (Signed)
Report called to Helene Kelp at [facility].

## 2018-01-13 ENCOUNTER — Other Ambulatory Visit: Payer: Medicare Other

## 2018-01-13 ENCOUNTER — Ambulatory Visit: Payer: Medicare Other | Admitting: Hematology

## 2018-01-18 DEATH — deceased

## 2018-01-21 ENCOUNTER — Inpatient Hospital Stay: Payer: Medicare Other | Admitting: Hematology

## 2018-01-21 ENCOUNTER — Inpatient Hospital Stay: Payer: Medicare Other

## 2018-02-24 ENCOUNTER — Encounter: Payer: Medicare Other | Admitting: Internal Medicine

## 2018-03-05 ENCOUNTER — Encounter: Payer: Self-pay | Admitting: Nurse Practitioner

## 2018-04-13 ENCOUNTER — Telehealth: Payer: Self-pay

## 2018-04-13 NOTE — Telephone Encounter (Signed)
Oral Oncology Patient Advocate Encounter  I received notification from [employer] that a new Prior Authorization was required for [place]. I went to the covermymeds key and the insurance could not verify him. I checked his benefits and it appears that he no longer has part D coverage. I called the patient to confirm this and talk about his options and there was no answer. I left a voicemail to return my call.  I will continue to update  [place] Patient [place] Phone [phone] Fax [phone]

## 2018-04-15 NOTE — Telephone Encounter (Signed)
Oral Oncology Patient Advocate Encounter  I called today and a family member answered the phone. She stated that the patient has passed away.   [place] Patient [place] Phone [phone] Fax [phone]

## 2018-11-09 IMAGING — DX DG CHEST 2V
2 series · 2 of 2 positions shown · non-contrast
Comparison: 04/09/2017

CLINICAL DATA: Shortness of breath for a few weeks. History of left
lung mass.

EXAM:
CHEST  2 VIEW

[chest pa]
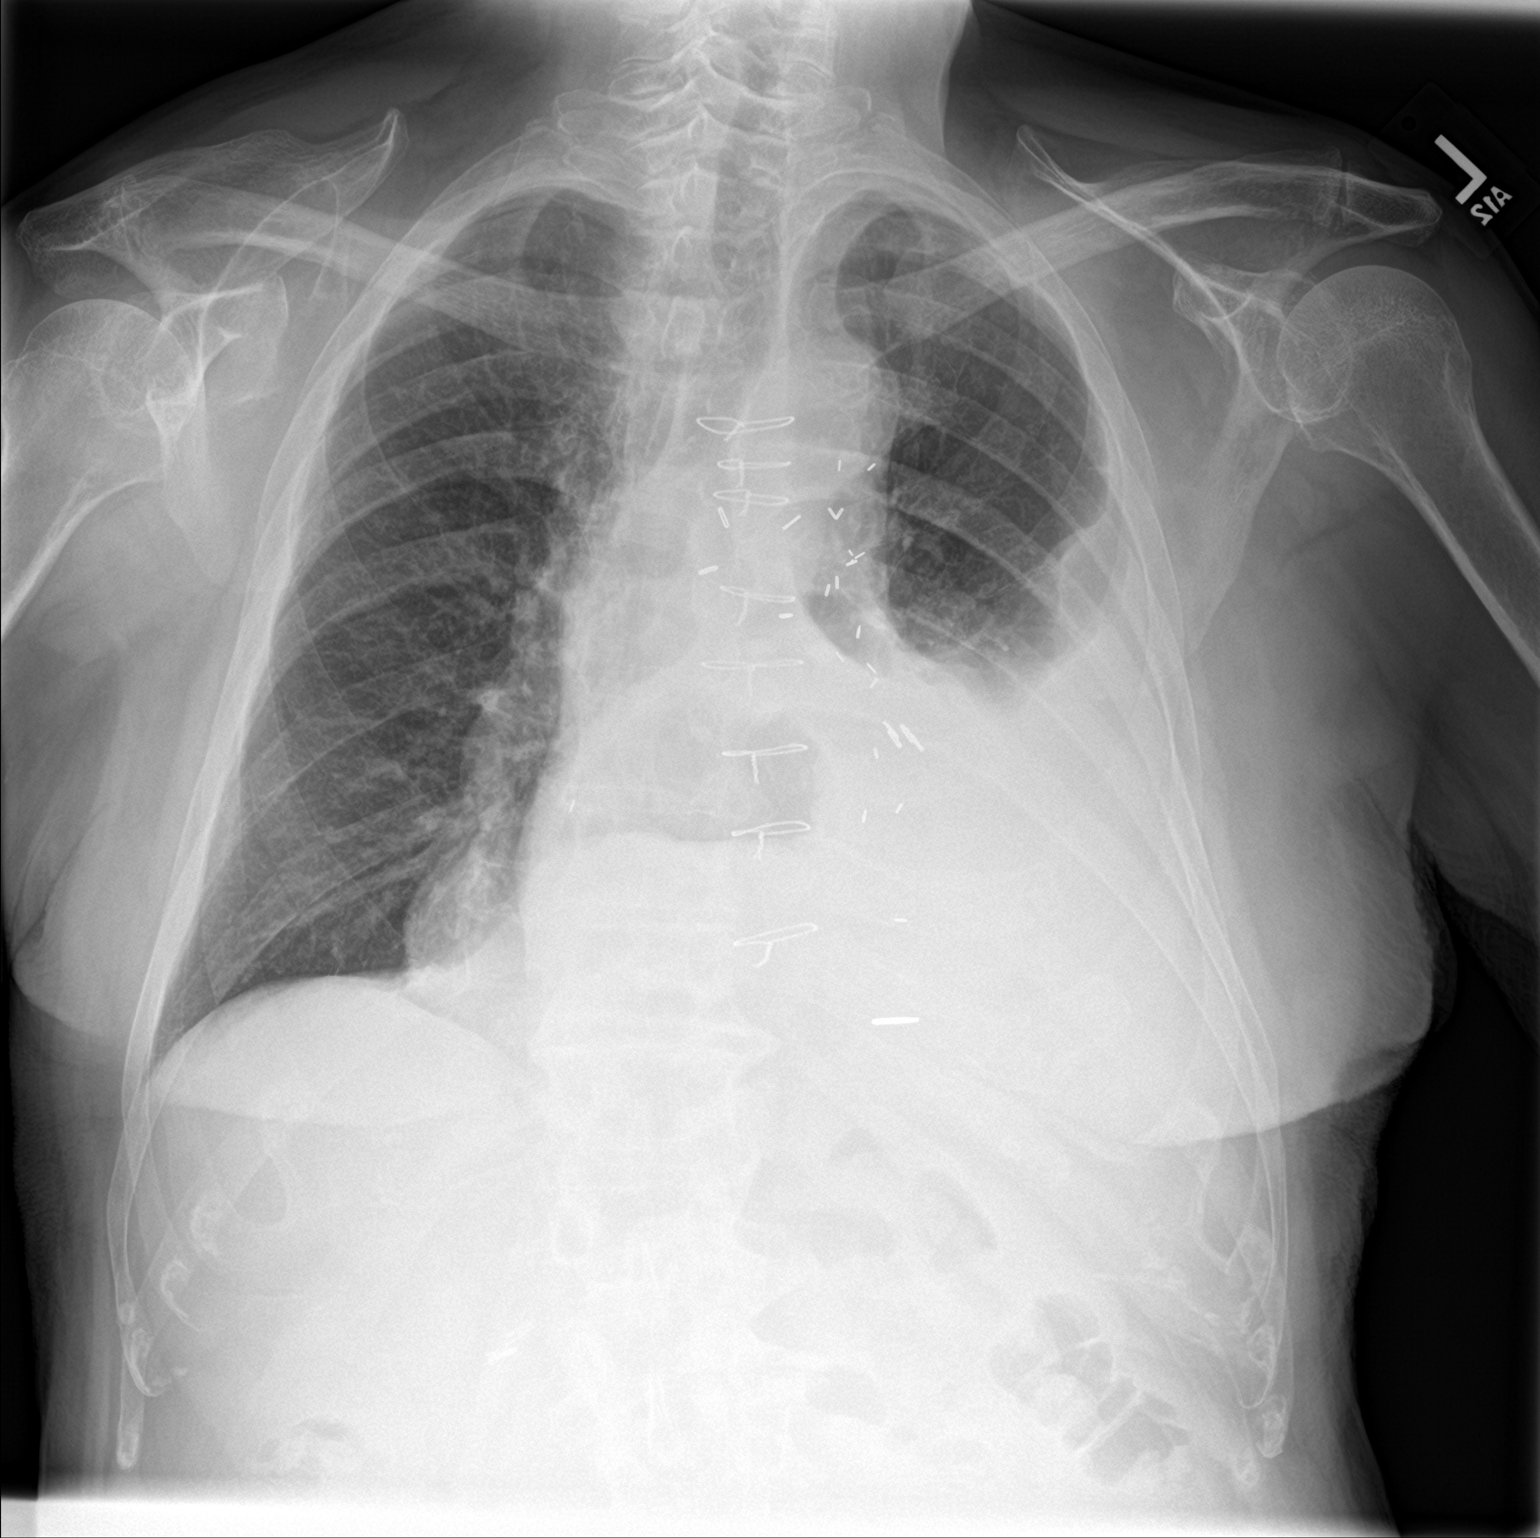

[chest lat]
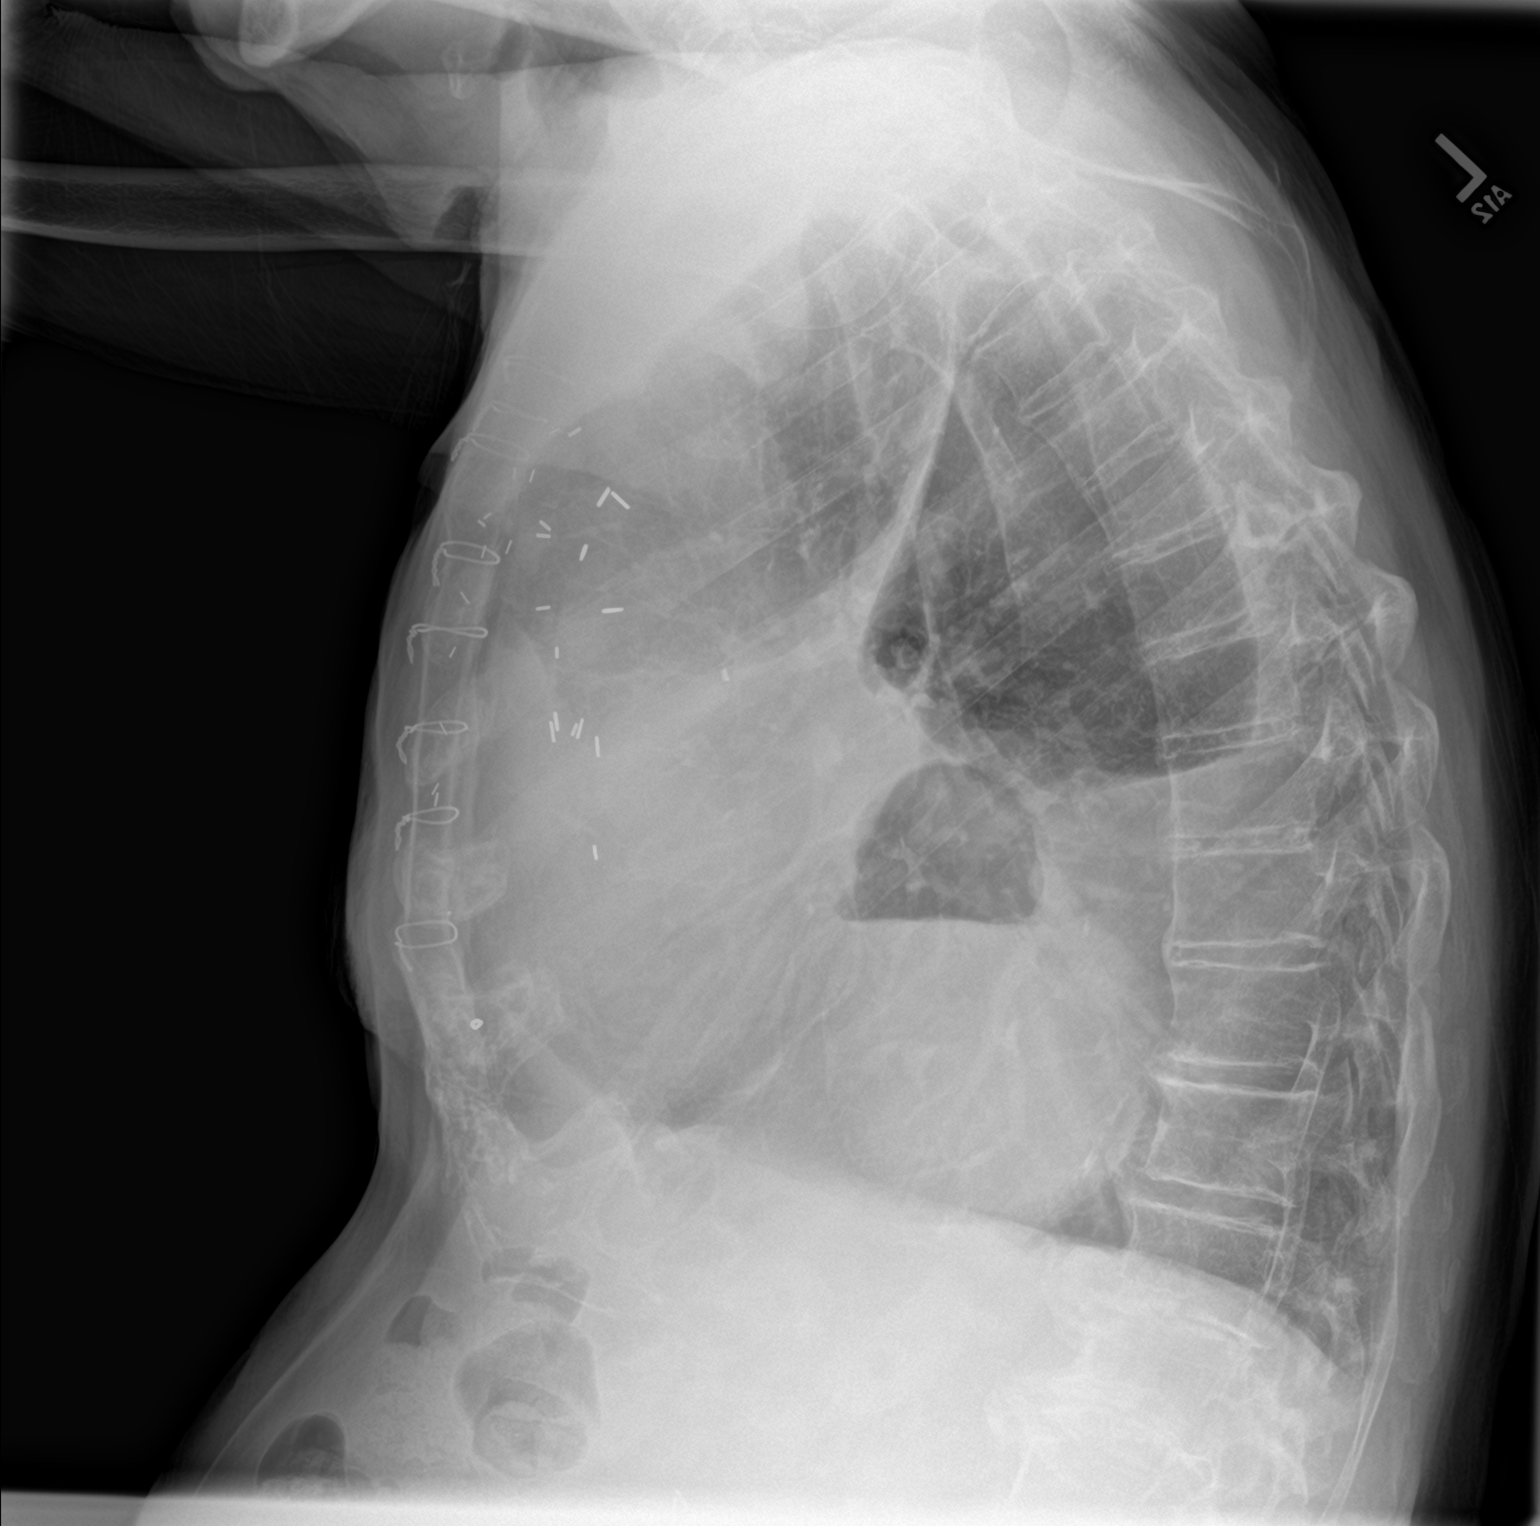

[2 of 2 positions shown; findings below may reference images not displayed]

FINDINGS: Moderate to large left pleural effusion that is increased from prior
on the AP view but not clearly on the lateral view. There is a
decubitus view which shows fluid layering. Chronic cardiomegaly and
sizable hiatal hernia. The right lung is clear. No acute osseous
finding.
IMPRESSION: 1. Moderate to large left pleural effusion with mild increased from
04/09/2017.
2. Cardiomegaly and large hiatal hernia.

## 2019-05-15 IMAGING — DX DG CHEST 2V
2 series · 2 of 2 positions shown · non-contrast
Comparison: Decubitus views same day. Previous standard chest
radiography 11/17/2017.

CLINICAL DATA: Left upper lobe lung cancer. Assess for layering
effusion.

EXAM:
CHEST - 2 VIEW

[chest pa]
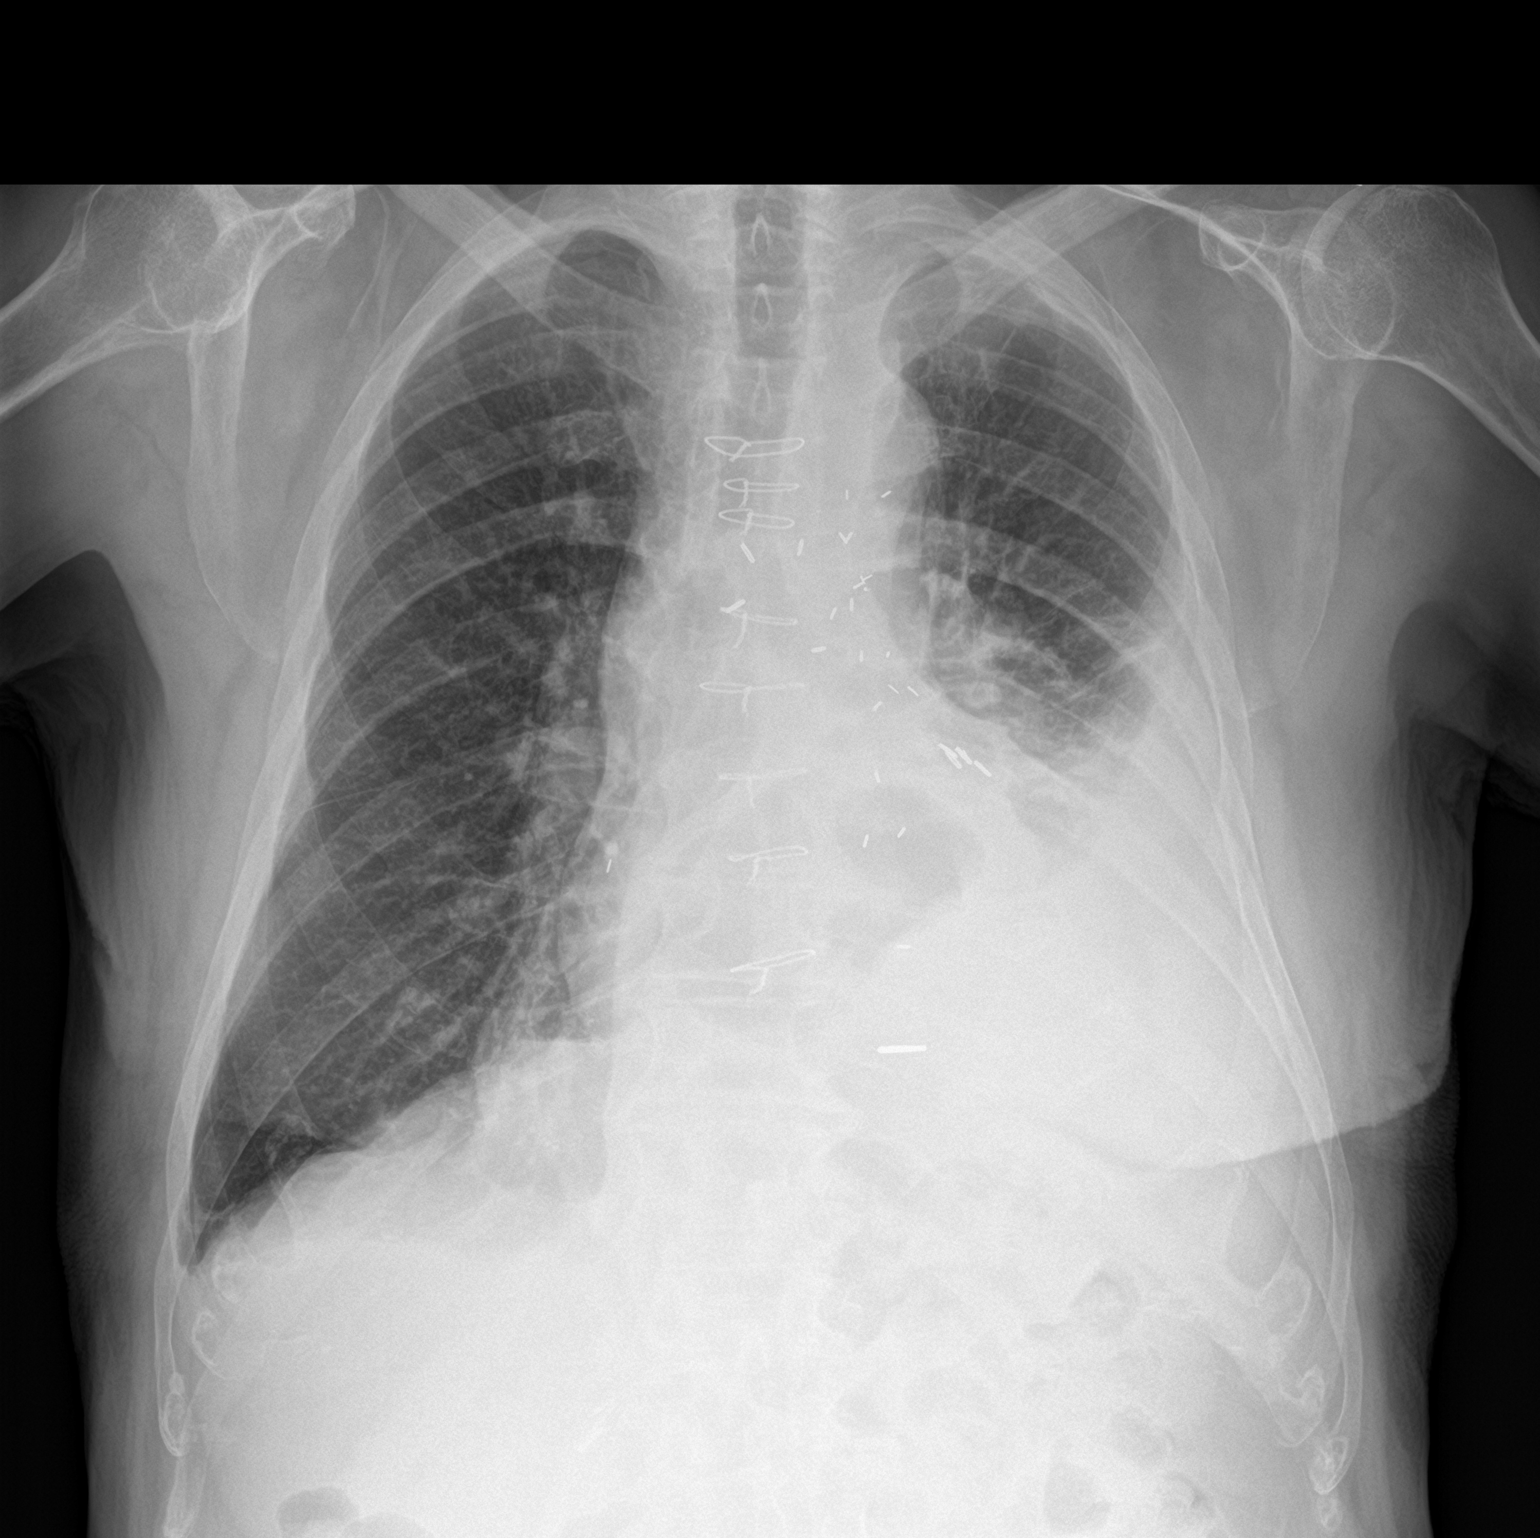

[chest lat]
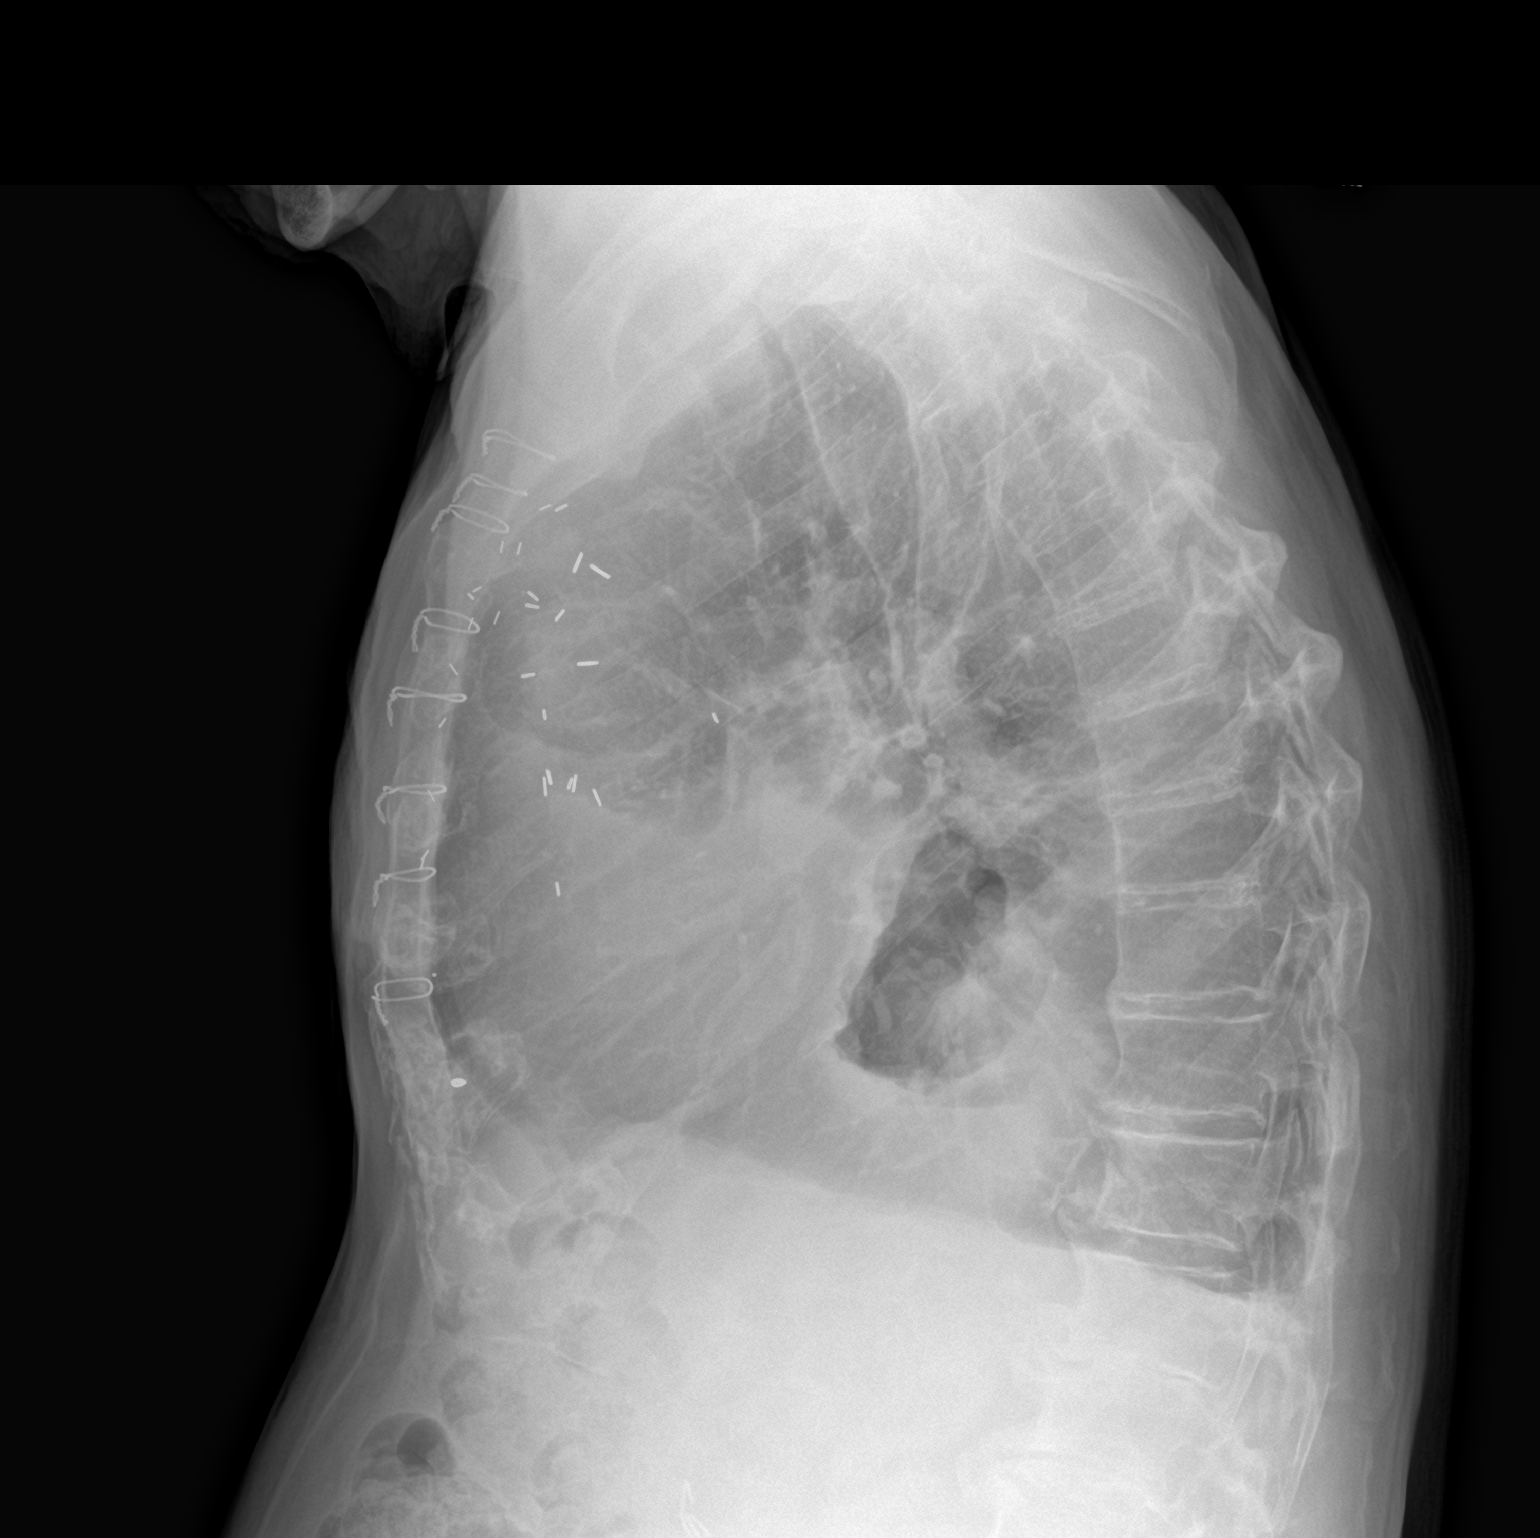

[2 of 2 positions shown; findings below may reference images not displayed]

FINDINGS: Previous median sternotomy and CABG. The right chest is clear. There
is a hiatal hernia. On the left, there is slightly better aeration
of the left lower lung compared to the study of 11/17/2017. Patient
does have some persistent pleural fluid on the left, shown to layer
on the decubitus film. Persistent volume loss in the left lower lung
related to the effusion that does remain. No acute bone finding.
IMPRESSION: Improved when compared to the study of 11/17/2017. Less pleural
fluid on the left with better aeration of the left lower lobe. Some
pleural fluid does persist, with persistent volume loss at the left
base.

## 2019-05-16 IMAGING — US US THORACENTESIS ASP PLEURAL SPACE W/IMG GUIDE
1 series · 10 of 10 positions shown · non-contrast
Comparison: none

INDICATION: Patient with history of metastatic adenocarcinoma of the lung with
recurrent malignant left pleural effusion, dyspnea. Request made for
therapeutic left thoracentesis.

[Series 1: us thoracentesis asp pleural space w/img guide · 10 of 10 slices shown]
[im 1/10]
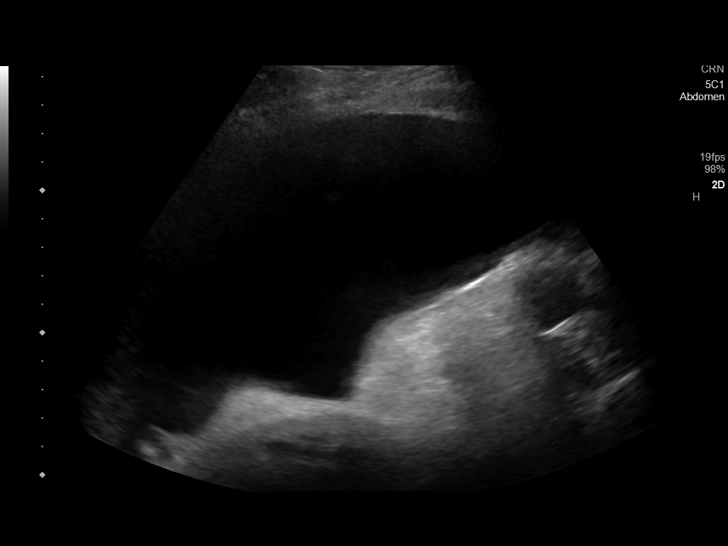
[im 2/10]
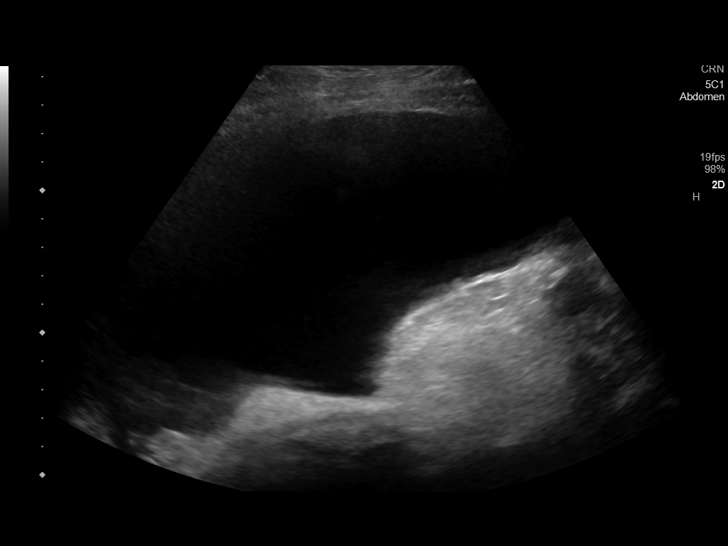
[im 3/10]
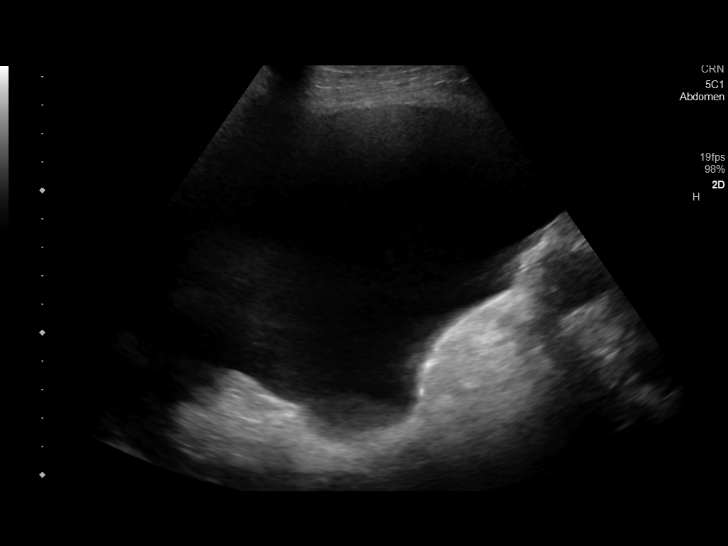
[im 4/10]
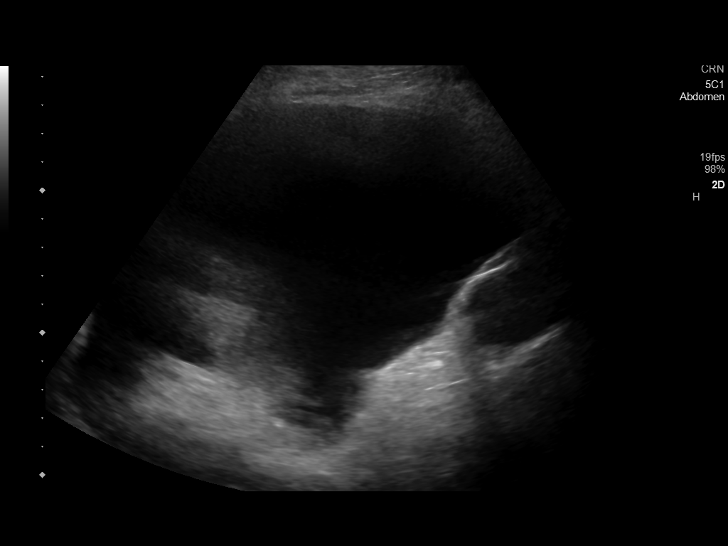
[im 5/10]
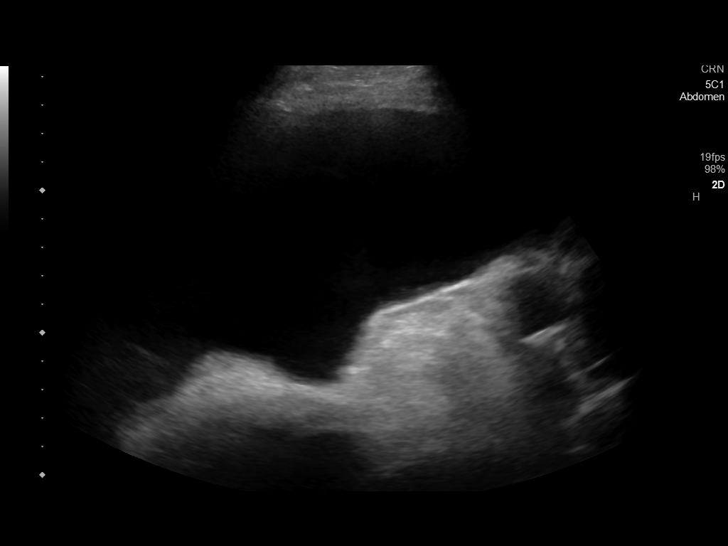
[im 6/10]
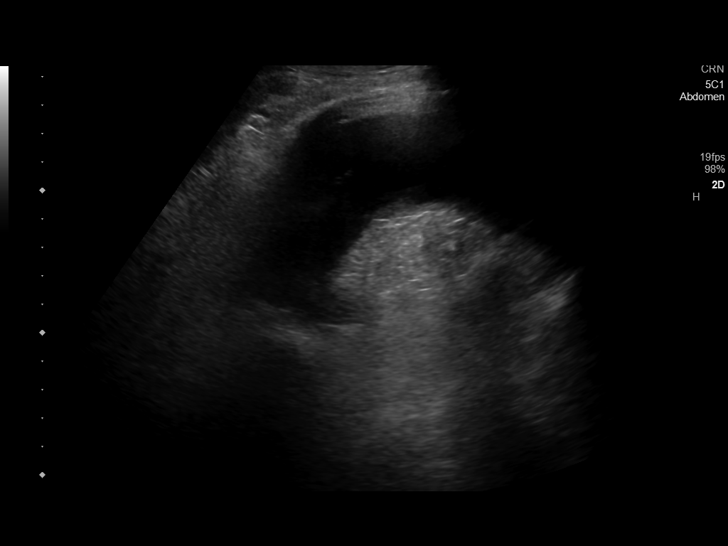
[im 7/10]
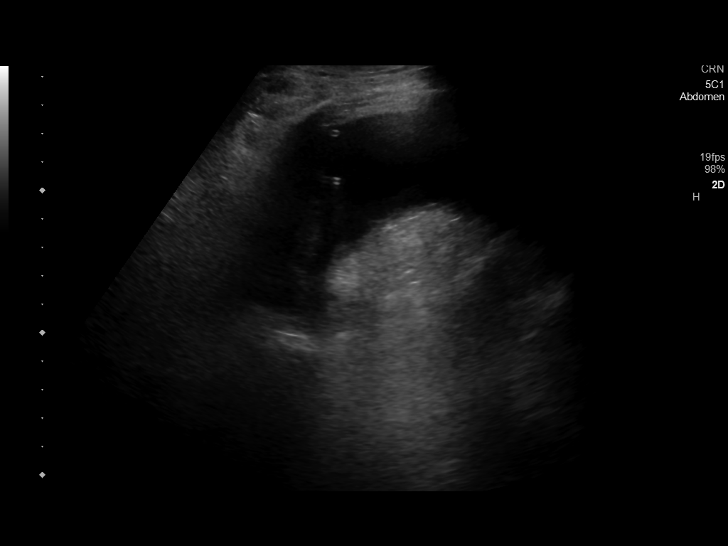
[im 8/10]
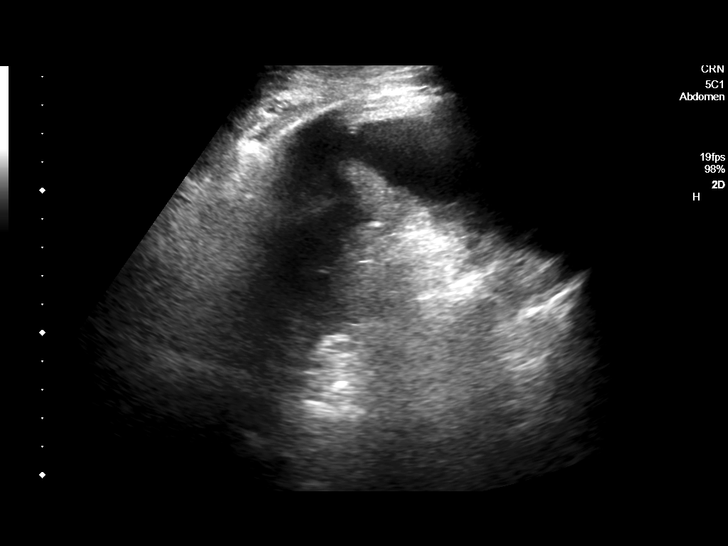
[im 9/10]
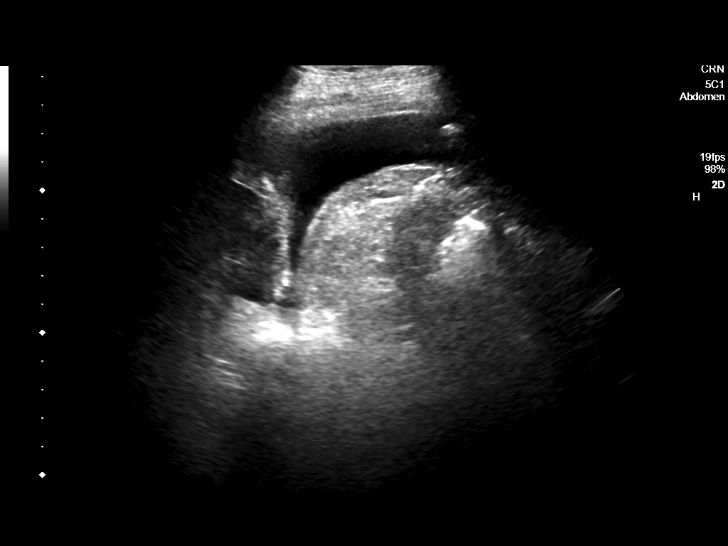
[im 10/10]
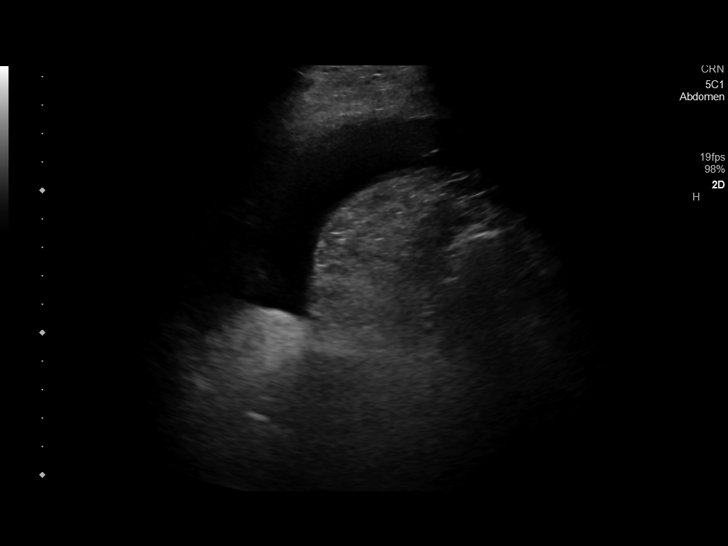

[10 of 10 positions shown; findings below may reference images not displayed]

EXAM:
ULTRASOUND GUIDED THERAPEUTIC LEFT THORACENTESIS

MEDICATIONS:
None

COMPLICATIONS:
None immediate.

PROCEDURE:
An ultrasound guided thoracentesis was thoroughly discussed with the
patient and questions answered. The benefits, risks, alternatives
and complications were also discussed. The patient understands and
wishes to proceed with the procedure. Written consent was obtained.

Ultrasound was performed to localize and mark an adequate pocket of
fluid in the left chest. The area was then prepped and draped in the
normal sterile fashion. 1% Lidocaine was used for local anesthesia.
Under ultrasound guidance a 6 Fr Safe-T-Centesis catheter was
introduced. Thoracentesis was performed. The catheter was removed
and a dressing applied.
FINDINGS: A total of approximately 1.2 liters of slightly hazy, yellow fluid
was removed. Due to patient chest/left shoulder discomfort only the
above amount of fluid was removed today.
IMPRESSION: Successful ultrasound guided therapeutic left thoracentesis yielding
1.2 liters of pleural fluid.

## 2019-05-16 IMAGING — DX DG CHEST 1V
1 series · 1 of 1 positions shown · non-contrast
Comparison: 12/24/2016

CLINICAL DATA: Post left thoracentesis, 1.2 liters removed, c/o
left shoulder pain

EXAM:
CHEST  1 VIEW

[chest pa]
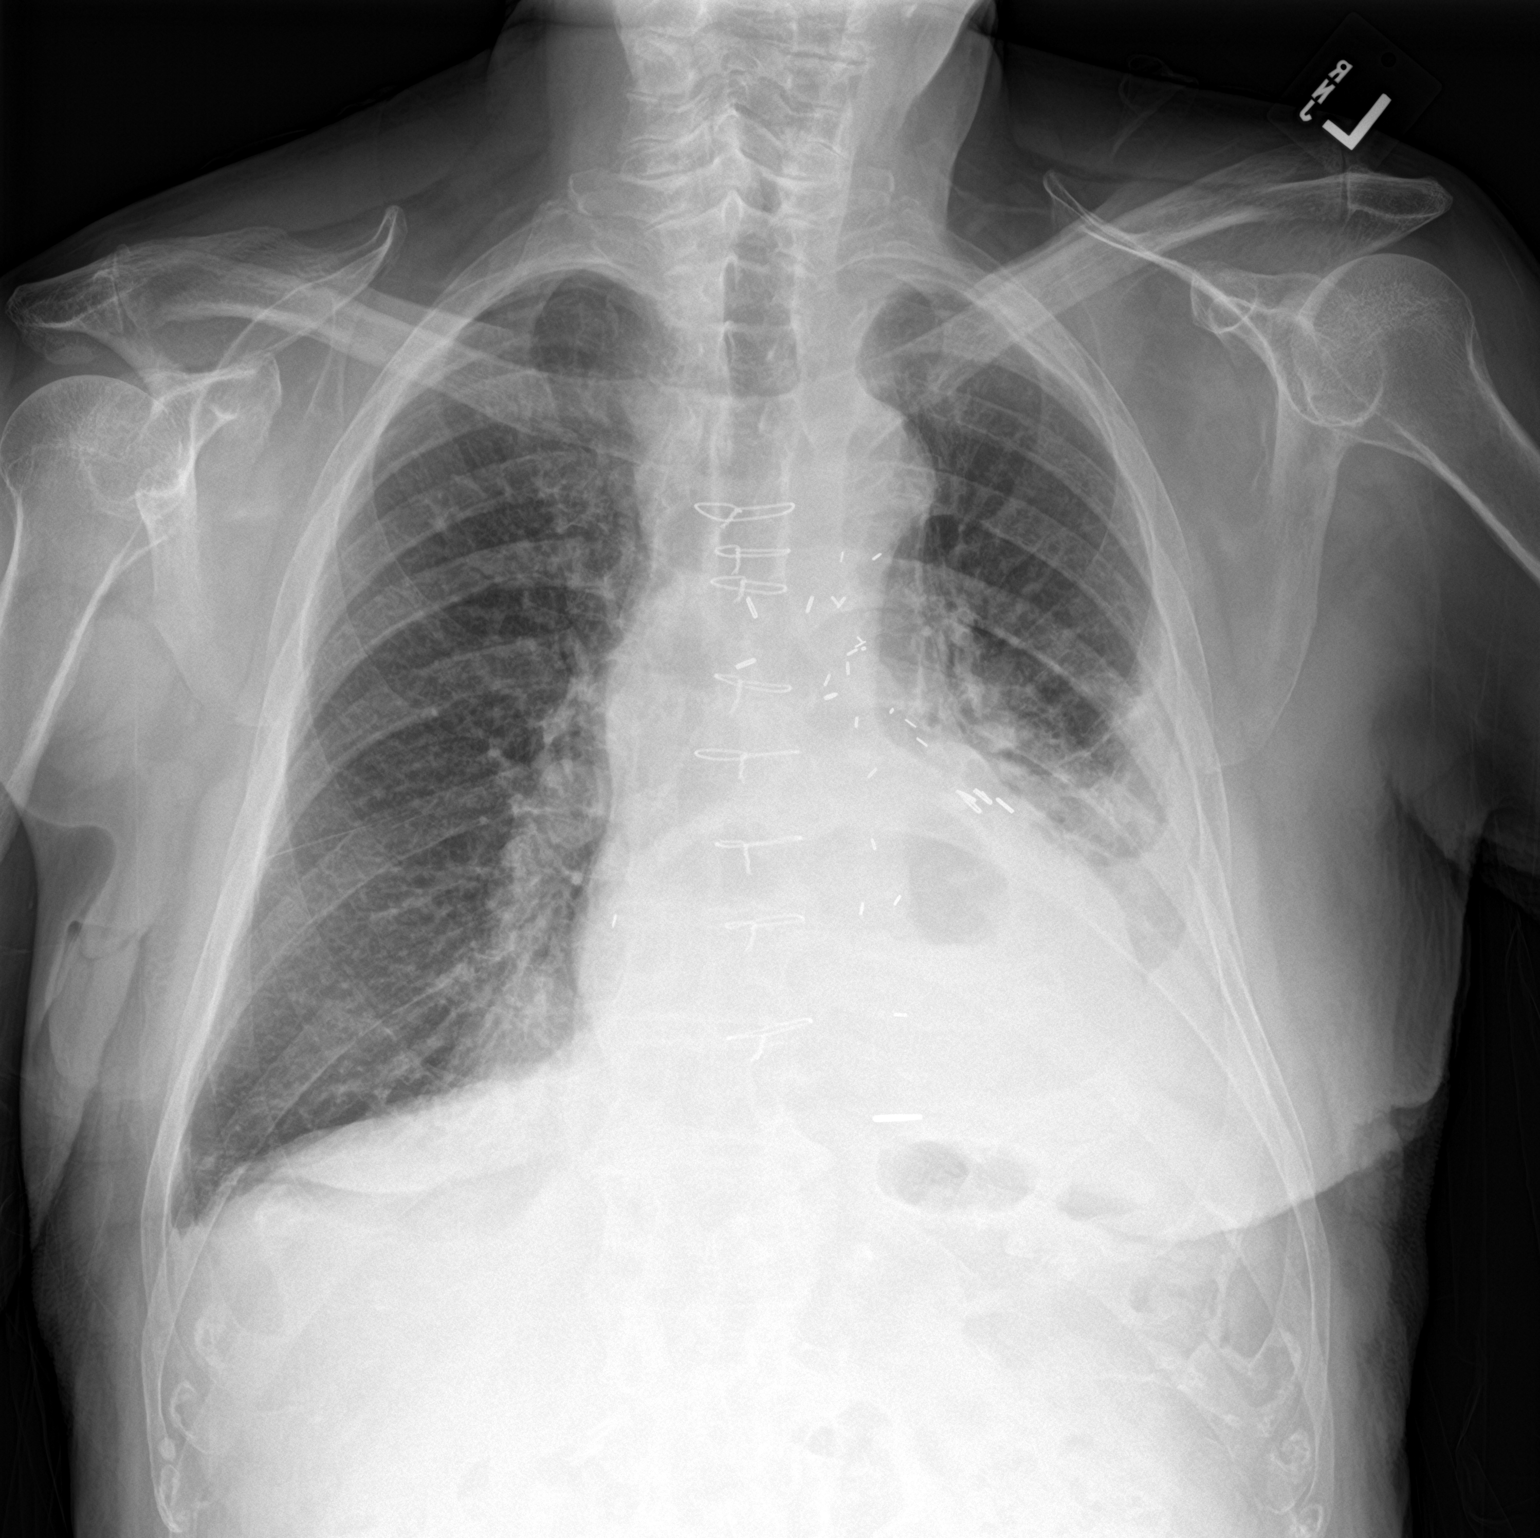

[1 of 1 positions shown; findings below may reference images not displayed]

FINDINGS: Median sternotomy and CABG. The heart size is mildly enlarged.
Moderate hiatal hernia is present. Small LEFT pleural effusion has
diminished in size. There is no pneumothorax. Trace RIGHT pleural
effusion and mild pulmonary edema.
IMPRESSION: No pneumothorax following LEFT thoracentesis.

Smaller LEFT pleural effusion.

Large hiatal hernia.

Mild edema.
